# Patient Record
Sex: Female | Born: 1945 | Race: White | Hispanic: No | Marital: Single | Smoking: Former smoker
Health system: Southern US, Community
[De-identification: ages and names within clinical notes are randomized; demographics above are authoritative.]

## PROBLEM LIST (undated history)

## (undated) DIAGNOSIS — Z8489 Family history of other specified conditions: Secondary | ICD-10-CM

## (undated) DIAGNOSIS — J439 Emphysema, unspecified: Secondary | ICD-10-CM

## (undated) DIAGNOSIS — Z9981 Dependence on supplemental oxygen: Secondary | ICD-10-CM

## (undated) DIAGNOSIS — J45909 Unspecified asthma, uncomplicated: Secondary | ICD-10-CM

## (undated) DIAGNOSIS — Z860100 Personal history of colon polyps, unspecified: Secondary | ICD-10-CM

## (undated) DIAGNOSIS — G5632 Lesion of radial nerve, left upper limb: Secondary | ICD-10-CM

## (undated) DIAGNOSIS — G250 Essential tremor: Secondary | ICD-10-CM

## (undated) DIAGNOSIS — K208 Other esophagitis: Secondary | ICD-10-CM

## (undated) DIAGNOSIS — Z87891 Personal history of nicotine dependence: Secondary | ICD-10-CM

## (undated) DIAGNOSIS — J9610 Chronic respiratory failure, unspecified whether with hypoxia or hypercapnia: Secondary | ICD-10-CM

## (undated) DIAGNOSIS — J432 Centrilobular emphysema: Secondary | ICD-10-CM

## (undated) DIAGNOSIS — M1990 Unspecified osteoarthritis, unspecified site: Secondary | ICD-10-CM

## (undated) DIAGNOSIS — E538 Deficiency of other specified B group vitamins: Secondary | ICD-10-CM

## (undated) DIAGNOSIS — M810 Age-related osteoporosis without current pathological fracture: Secondary | ICD-10-CM

## (undated) DIAGNOSIS — J449 Chronic obstructive pulmonary disease, unspecified: Secondary | ICD-10-CM

## (undated) DIAGNOSIS — K3184 Gastroparesis: Secondary | ICD-10-CM

## (undated) DIAGNOSIS — E559 Vitamin D deficiency, unspecified: Secondary | ICD-10-CM

## (undated) DIAGNOSIS — F4322 Adjustment disorder with anxiety: Secondary | ICD-10-CM

## (undated) DIAGNOSIS — J309 Allergic rhinitis, unspecified: Secondary | ICD-10-CM

## (undated) DIAGNOSIS — G4700 Insomnia, unspecified: Secondary | ICD-10-CM

## (undated) DIAGNOSIS — S42409A Unspecified fracture of lower end of unspecified humerus, initial encounter for closed fracture: Secondary | ICD-10-CM

## (undated) DIAGNOSIS — K210 Gastro-esophageal reflux disease with esophagitis: Secondary | ICD-10-CM

## (undated) DIAGNOSIS — Z800 Family history of malignant neoplasm of digestive organs: Secondary | ICD-10-CM

## (undated) DIAGNOSIS — K219 Gastro-esophageal reflux disease without esophagitis: Secondary | ICD-10-CM

## (undated) DIAGNOSIS — T4145XA Adverse effect of unspecified anesthetic, initial encounter: Secondary | ICD-10-CM

## (undated) DIAGNOSIS — M8589 Other specified disorders of bone density and structure, multiple sites: Secondary | ICD-10-CM

## (undated) DIAGNOSIS — I479 Paroxysmal tachycardia, unspecified: Secondary | ICD-10-CM

## (undated) DIAGNOSIS — N390 Urinary tract infection, site not specified: Secondary | ICD-10-CM

## (undated) DIAGNOSIS — G4733 Obstructive sleep apnea (adult) (pediatric): Secondary | ICD-10-CM

## (undated) DIAGNOSIS — I10 Essential (primary) hypertension: Secondary | ICD-10-CM

## (undated) DIAGNOSIS — K5730 Diverticulosis of large intestine without perforation or abscess without bleeding: Secondary | ICD-10-CM

## (undated) DIAGNOSIS — T8859XA Other complications of anesthesia, initial encounter: Secondary | ICD-10-CM

## (undated) DIAGNOSIS — D122 Benign neoplasm of ascending colon: Secondary | ICD-10-CM

## (undated) DIAGNOSIS — F411 Generalized anxiety disorder: Secondary | ICD-10-CM

## (undated) DIAGNOSIS — D124 Benign neoplasm of descending colon: Secondary | ICD-10-CM

## (undated) DIAGNOSIS — E042 Nontoxic multinodular goiter: Secondary | ICD-10-CM

## (undated) DIAGNOSIS — IMO0001 Reserved for inherently not codable concepts without codable children: Secondary | ICD-10-CM

## (undated) HISTORY — PX: CHOLECYSTECTOMY: SHX55

## (undated) HISTORY — DX: Diverticulosis of large intestine without perforation or abscess without bleeding: K57.30

## (undated) HISTORY — DX: Personal history of colonic polyps: Z86.010

## (undated) HISTORY — DX: Personal history of nicotine dependence: Z87.891

## (undated) HISTORY — DX: Other esophagitis: K20.8

## (undated) HISTORY — DX: Insomnia, unspecified: G47.00

## (undated) HISTORY — DX: Emphysema, unspecified: J43.9

## (undated) HISTORY — DX: Nontoxic multinodular goiter: E04.2

## (undated) HISTORY — DX: Vitamin D deficiency, unspecified: E55.9

## (undated) HISTORY — DX: Generalized anxiety disorder: F41.1

## (undated) HISTORY — DX: Unspecified fracture of lower end of unspecified humerus, initial encounter for closed fracture: S42.409A

## (undated) HISTORY — DX: Family history of malignant neoplasm of digestive organs: Z80.0

## (undated) HISTORY — DX: Paroxysmal tachycardia, unspecified: I47.9

## (undated) HISTORY — DX: Unspecified osteoarthritis, unspecified site: M19.90

## (undated) HISTORY — DX: Lesion of radial nerve, left upper limb: G56.32

## (undated) HISTORY — PX: FRACTURE SURGERY: SHX138

## (undated) HISTORY — DX: Chronic obstructive pulmonary disease, unspecified: J44.9

## (undated) HISTORY — DX: Deficiency of other specified B group vitamins: E53.8

## (undated) HISTORY — DX: Centrilobular emphysema: J43.2

## (undated) HISTORY — DX: Essential (primary) hypertension: I10

## (undated) HISTORY — DX: Obstructive sleep apnea (adult) (pediatric): G47.33

## (undated) HISTORY — DX: Dependence on supplemental oxygen: Z99.81

## (undated) HISTORY — DX: Other specified disorders of bone density and structure, multiple sites: M85.89

## (undated) HISTORY — DX: Benign neoplasm of descending colon: D12.4

## (undated) HISTORY — PX: CATARACT EXTRACTION: SUR2

## (undated) HISTORY — DX: Chronic respiratory failure, unspecified whether with hypoxia or hypercapnia: J96.10

## (undated) HISTORY — DX: Personal history of colon polyps, unspecified: Z86.0100

## (undated) HISTORY — DX: Unspecified asthma, uncomplicated: J45.909

## (undated) HISTORY — PX: ABDOMINAL HYSTERECTOMY: SUR658

## (undated) HISTORY — DX: Gastroparesis: K31.84

## (undated) HISTORY — DX: Benign neoplasm of ascending colon: D12.2

## (undated) HISTORY — DX: Allergic rhinitis, unspecified: J30.9

## (undated) HISTORY — DX: Essential tremor: G25.0

## (undated) HISTORY — PX: ABDOMINAL HYSTERECTOMY: SHX81

## (undated) HISTORY — PX: OOPHORECTOMY: SHX86

## (undated) HISTORY — DX: Gastro-esophageal reflux disease with esophagitis: K21.0

## (undated) HISTORY — DX: Adjustment disorder with anxiety: F43.22

## (undated) HISTORY — DX: Gastro-esophageal reflux disease without esophagitis: K21.9

## (undated) HISTORY — DX: Age-related osteoporosis without current pathological fracture: M81.0

---

## 1984-05-18 HISTORY — PX: GALLBLADDER SURGERY: SHX652

## 1998-04-23 ENCOUNTER — Ambulatory Visit (HOSPITAL_COMMUNITY): Admission: RE | Admit: 1998-04-23 | Discharge: 1998-04-23 | Payer: Self-pay | Admitting: Gynecology

## 1998-04-23 ENCOUNTER — Encounter: Payer: Self-pay | Admitting: Gynecology

## 1998-05-01 ENCOUNTER — Other Ambulatory Visit: Admission: RE | Admit: 1998-05-01 | Discharge: 1998-05-01 | Payer: Self-pay | Admitting: Gynecology

## 1999-01-07 ENCOUNTER — Inpatient Hospital Stay (HOSPITAL_COMMUNITY): Admission: EM | Admit: 1999-01-07 | Discharge: 1999-01-10 | Payer: Self-pay | Admitting: Gastroenterology

## 1999-01-07 ENCOUNTER — Encounter: Payer: Self-pay | Admitting: Gastroenterology

## 1999-01-08 ENCOUNTER — Encounter: Payer: Self-pay | Admitting: Gastroenterology

## 1999-01-09 ENCOUNTER — Encounter: Payer: Self-pay | Admitting: Gastroenterology

## 1999-06-04 ENCOUNTER — Other Ambulatory Visit: Admission: RE | Admit: 1999-06-04 | Discharge: 1999-06-04 | Payer: Self-pay | Admitting: Gynecology

## 1999-09-09 ENCOUNTER — Other Ambulatory Visit: Admission: RE | Admit: 1999-09-09 | Discharge: 1999-09-09 | Payer: Self-pay | Admitting: Orthopedic Surgery

## 2000-03-04 ENCOUNTER — Ambulatory Visit (HOSPITAL_COMMUNITY): Admission: RE | Admit: 2000-03-04 | Discharge: 2000-03-04 | Payer: Self-pay | Admitting: Gynecology

## 2000-03-04 ENCOUNTER — Encounter: Payer: Self-pay | Admitting: Gynecology

## 2001-06-01 ENCOUNTER — Encounter: Payer: Self-pay | Admitting: Gynecology

## 2001-06-01 ENCOUNTER — Ambulatory Visit (HOSPITAL_COMMUNITY): Admission: RE | Admit: 2001-06-01 | Discharge: 2001-06-01 | Payer: Self-pay | Admitting: Gynecology

## 2001-06-06 ENCOUNTER — Other Ambulatory Visit: Admission: RE | Admit: 2001-06-06 | Discharge: 2001-06-06 | Payer: Self-pay | Admitting: Gynecology

## 2002-07-05 ENCOUNTER — Ambulatory Visit (HOSPITAL_COMMUNITY): Admission: RE | Admit: 2002-07-05 | Discharge: 2002-07-05 | Payer: Self-pay | Admitting: Internal Medicine

## 2002-07-05 ENCOUNTER — Encounter: Payer: Self-pay | Admitting: Internal Medicine

## 2002-08-29 ENCOUNTER — Encounter: Admission: RE | Admit: 2002-08-29 | Discharge: 2002-08-29 | Payer: Self-pay | Admitting: Internal Medicine

## 2002-08-29 ENCOUNTER — Encounter: Payer: Self-pay | Admitting: Internal Medicine

## 2003-06-13 ENCOUNTER — Encounter: Admission: RE | Admit: 2003-06-13 | Discharge: 2003-06-13 | Payer: Self-pay | Admitting: Internal Medicine

## 2003-12-17 ENCOUNTER — Ambulatory Visit (HOSPITAL_COMMUNITY): Admission: RE | Admit: 2003-12-17 | Discharge: 2003-12-17 | Payer: Self-pay | Admitting: Internal Medicine

## 2004-06-27 ENCOUNTER — Ambulatory Visit: Payer: Self-pay | Admitting: Gastroenterology

## 2004-12-18 ENCOUNTER — Ambulatory Visit (HOSPITAL_COMMUNITY): Admission: RE | Admit: 2004-12-18 | Discharge: 2004-12-18 | Payer: Self-pay | Admitting: Internal Medicine

## 2005-10-06 ENCOUNTER — Encounter: Admission: RE | Admit: 2005-10-06 | Discharge: 2005-10-06 | Payer: Self-pay | Admitting: Internal Medicine

## 2005-11-10 ENCOUNTER — Ambulatory Visit: Payer: Self-pay | Admitting: Gastroenterology

## 2005-12-02 ENCOUNTER — Encounter: Payer: Self-pay | Admitting: Gastroenterology

## 2005-12-02 ENCOUNTER — Ambulatory Visit: Payer: Self-pay | Admitting: Gastroenterology

## 2005-12-08 ENCOUNTER — Ambulatory Visit: Payer: Self-pay | Admitting: Gastroenterology

## 2006-01-05 ENCOUNTER — Ambulatory Visit: Payer: Self-pay | Admitting: Gastroenterology

## 2006-04-01 ENCOUNTER — Ambulatory Visit (HOSPITAL_COMMUNITY): Admission: RE | Admit: 2006-04-01 | Discharge: 2006-04-01 | Payer: Self-pay | Admitting: Internal Medicine

## 2006-07-22 ENCOUNTER — Ambulatory Visit: Payer: Self-pay | Admitting: Gastroenterology

## 2007-04-19 ENCOUNTER — Ambulatory Visit (HOSPITAL_COMMUNITY): Admission: RE | Admit: 2007-04-19 | Discharge: 2007-04-19 | Payer: Self-pay | Admitting: Internal Medicine

## 2007-07-11 ENCOUNTER — Ambulatory Visit: Payer: Self-pay | Admitting: Internal Medicine

## 2007-07-19 ENCOUNTER — Encounter: Payer: Self-pay | Admitting: Gastroenterology

## 2007-07-19 DIAGNOSIS — I10 Essential (primary) hypertension: Secondary | ICD-10-CM

## 2007-07-19 DIAGNOSIS — K3184 Gastroparesis: Secondary | ICD-10-CM | POA: Insufficient documentation

## 2007-07-19 DIAGNOSIS — D126 Benign neoplasm of colon, unspecified: Secondary | ICD-10-CM | POA: Insufficient documentation

## 2007-07-19 HISTORY — DX: Essential (primary) hypertension: I10

## 2007-07-26 ENCOUNTER — Telehealth (INDEPENDENT_AMBULATORY_CARE_PROVIDER_SITE_OTHER): Payer: Self-pay | Admitting: *Deleted

## 2007-07-29 ENCOUNTER — Telehealth (INDEPENDENT_AMBULATORY_CARE_PROVIDER_SITE_OTHER): Payer: Self-pay | Admitting: *Deleted

## 2007-08-02 ENCOUNTER — Ambulatory Visit: Payer: Self-pay | Admitting: Gastroenterology

## 2007-08-02 ENCOUNTER — Inpatient Hospital Stay (HOSPITAL_COMMUNITY): Admission: AD | Admit: 2007-08-02 | Discharge: 2007-08-04 | Payer: Self-pay | Admitting: Gastroenterology

## 2007-08-03 ENCOUNTER — Encounter: Payer: Self-pay | Admitting: Gastroenterology

## 2007-08-09 ENCOUNTER — Ambulatory Visit: Payer: Self-pay | Admitting: Internal Medicine

## 2007-08-23 ENCOUNTER — Ambulatory Visit: Payer: Self-pay | Admitting: Gastroenterology

## 2007-08-23 LAB — CONVERTED CEMR LAB
Albumin: 4.1 g/dL (ref 3.5–5.2)
CO2: 29 meq/L (ref 19–32)
Chloride: 94 meq/L — ABNORMAL LOW (ref 96–112)
GFR calc Af Amer: 131 mL/min
GFR calc non Af Amer: 108 mL/min
Phosphorus: 3.9 mg/dL (ref 2.3–4.6)
Sodium: 129 meq/L — ABNORMAL LOW (ref 135–145)

## 2007-09-01 ENCOUNTER — Ambulatory Visit: Payer: Self-pay | Admitting: Gastroenterology

## 2007-09-01 ENCOUNTER — Ambulatory Visit: Payer: Self-pay | Admitting: Internal Medicine

## 2007-09-01 LAB — CONVERTED CEMR LAB
Calcium: 9.7 mg/dL (ref 8.4–10.5)
Chloride: 90 meq/L — ABNORMAL LOW (ref 96–112)
Creatinine, Ser: 0.6 mg/dL (ref 0.4–1.2)
GFR calc Af Amer: 131 mL/min
Phosphorus: 3.7 mg/dL (ref 2.3–4.6)
Potassium: 3.2 meq/L — ABNORMAL LOW (ref 3.5–5.1)

## 2007-09-05 ENCOUNTER — Telehealth (INDEPENDENT_AMBULATORY_CARE_PROVIDER_SITE_OTHER): Payer: Self-pay | Admitting: *Deleted

## 2007-09-08 ENCOUNTER — Ambulatory Visit: Payer: Self-pay | Admitting: Gastroenterology

## 2007-09-08 LAB — CONVERTED CEMR LAB
Albumin: 4 g/dL (ref 3.5–5.2)
BUN: 3 mg/dL — ABNORMAL LOW (ref 6–23)
Calcium: 9.4 mg/dL (ref 8.4–10.5)
Creatinine, Ser: 0.5 mg/dL (ref 0.4–1.2)
Glucose, Bld: 80 mg/dL (ref 70–99)
Phosphorus: 3.8 mg/dL (ref 2.3–4.6)
Potassium: 3.5 meq/L (ref 3.5–5.1)

## 2007-09-15 ENCOUNTER — Ambulatory Visit: Payer: Self-pay | Admitting: Internal Medicine

## 2007-09-22 ENCOUNTER — Telehealth: Payer: Self-pay | Admitting: Gastroenterology

## 2007-09-26 ENCOUNTER — Inpatient Hospital Stay (HOSPITAL_COMMUNITY): Admission: EM | Admit: 2007-09-26 | Discharge: 2007-09-29 | Payer: Self-pay | Admitting: Emergency Medicine

## 2007-09-26 ENCOUNTER — Telehealth: Payer: Self-pay | Admitting: Gastroenterology

## 2007-09-26 ENCOUNTER — Telehealth (INDEPENDENT_AMBULATORY_CARE_PROVIDER_SITE_OTHER): Payer: Self-pay | Admitting: *Deleted

## 2007-09-26 ENCOUNTER — Ambulatory Visit: Payer: Self-pay | Admitting: Pulmonary Disease

## 2007-09-26 ENCOUNTER — Ambulatory Visit: Payer: Self-pay | Admitting: Internal Medicine

## 2007-09-28 ENCOUNTER — Encounter: Payer: Self-pay | Admitting: Pulmonary Disease

## 2007-10-12 DIAGNOSIS — K2100 Gastro-esophageal reflux disease with esophagitis, without bleeding: Secondary | ICD-10-CM | POA: Insufficient documentation

## 2007-10-12 DIAGNOSIS — K648 Other hemorrhoids: Secondary | ICD-10-CM | POA: Insufficient documentation

## 2007-10-12 DIAGNOSIS — K5730 Diverticulosis of large intestine without perforation or abscess without bleeding: Secondary | ICD-10-CM

## 2007-10-12 DIAGNOSIS — M8589 Other specified disorders of bone density and structure, multiple sites: Secondary | ICD-10-CM | POA: Insufficient documentation

## 2007-10-12 HISTORY — DX: Gastro-esophageal reflux disease with esophagitis, without bleeding: K21.00

## 2007-10-12 HISTORY — DX: Diverticulosis of large intestine without perforation or abscess without bleeding: K57.30

## 2007-10-12 HISTORY — DX: Other specified disorders of bone density and structure, multiple sites: M85.89

## 2007-10-14 ENCOUNTER — Ambulatory Visit: Payer: Self-pay | Admitting: Gastroenterology

## 2007-10-19 ENCOUNTER — Ambulatory Visit: Payer: Self-pay | Admitting: Pulmonary Disease

## 2007-10-19 DIAGNOSIS — J449 Chronic obstructive pulmonary disease, unspecified: Secondary | ICD-10-CM

## 2007-10-19 DIAGNOSIS — F17200 Nicotine dependence, unspecified, uncomplicated: Secondary | ICD-10-CM

## 2007-10-19 DIAGNOSIS — J4489 Other specified chronic obstructive pulmonary disease: Secondary | ICD-10-CM | POA: Insufficient documentation

## 2007-10-19 DIAGNOSIS — G4733 Obstructive sleep apnea (adult) (pediatric): Secondary | ICD-10-CM | POA: Insufficient documentation

## 2007-10-26 ENCOUNTER — Ambulatory Visit (HOSPITAL_BASED_OUTPATIENT_CLINIC_OR_DEPARTMENT_OTHER): Admission: RE | Admit: 2007-10-26 | Discharge: 2007-10-26 | Payer: Self-pay | Admitting: Pulmonary Disease

## 2007-10-26 ENCOUNTER — Ambulatory Visit: Payer: Self-pay | Admitting: Pulmonary Disease

## 2007-11-07 ENCOUNTER — Telehealth: Payer: Self-pay | Admitting: Pulmonary Disease

## 2007-11-17 ENCOUNTER — Telehealth (INDEPENDENT_AMBULATORY_CARE_PROVIDER_SITE_OTHER): Payer: Self-pay | Admitting: *Deleted

## 2007-11-24 ENCOUNTER — Ambulatory Visit: Payer: Self-pay | Admitting: Pulmonary Disease

## 2007-11-24 DIAGNOSIS — R498 Other voice and resonance disorders: Secondary | ICD-10-CM

## 2007-11-28 ENCOUNTER — Telehealth (INDEPENDENT_AMBULATORY_CARE_PROVIDER_SITE_OTHER): Payer: Self-pay | Admitting: *Deleted

## 2007-11-30 ENCOUNTER — Encounter: Payer: Self-pay | Admitting: Pulmonary Disease

## 2007-12-29 ENCOUNTER — Encounter: Payer: Self-pay | Admitting: Pulmonary Disease

## 2008-01-04 ENCOUNTER — Encounter (HOSPITAL_COMMUNITY): Admission: RE | Admit: 2008-01-04 | Discharge: 2008-04-03 | Payer: Self-pay | Admitting: Pulmonary Disease

## 2008-01-04 ENCOUNTER — Encounter: Payer: Self-pay | Admitting: Pulmonary Disease

## 2008-01-04 ENCOUNTER — Telehealth: Payer: Self-pay | Admitting: Gastroenterology

## 2008-01-27 ENCOUNTER — Encounter: Payer: Self-pay | Admitting: Pulmonary Disease

## 2008-02-14 ENCOUNTER — Encounter: Payer: Self-pay | Admitting: Internal Medicine

## 2008-02-17 ENCOUNTER — Telehealth: Payer: Self-pay | Admitting: Pulmonary Disease

## 2008-02-24 ENCOUNTER — Encounter: Payer: Self-pay | Admitting: Gastroenterology

## 2008-02-24 ENCOUNTER — Telehealth (INDEPENDENT_AMBULATORY_CARE_PROVIDER_SITE_OTHER): Payer: Self-pay | Admitting: *Deleted

## 2008-04-03 ENCOUNTER — Encounter: Payer: Self-pay | Admitting: Pulmonary Disease

## 2008-04-24 ENCOUNTER — Ambulatory Visit (HOSPITAL_COMMUNITY): Admission: RE | Admit: 2008-04-24 | Discharge: 2008-04-24 | Payer: Self-pay | Admitting: Internal Medicine

## 2008-09-21 ENCOUNTER — Telehealth (INDEPENDENT_AMBULATORY_CARE_PROVIDER_SITE_OTHER): Payer: Self-pay | Admitting: *Deleted

## 2008-09-25 ENCOUNTER — Ambulatory Visit: Payer: Self-pay | Admitting: Pulmonary Disease

## 2008-10-11 ENCOUNTER — Encounter (INDEPENDENT_AMBULATORY_CARE_PROVIDER_SITE_OTHER): Payer: Self-pay | Admitting: *Deleted

## 2008-10-16 ENCOUNTER — Telehealth: Payer: Self-pay | Admitting: Gastroenterology

## 2008-10-25 ENCOUNTER — Telehealth: Payer: Self-pay | Admitting: Gastroenterology

## 2008-10-26 ENCOUNTER — Telehealth: Payer: Self-pay | Admitting: Gastroenterology

## 2008-12-10 ENCOUNTER — Telehealth: Payer: Self-pay | Admitting: Gastroenterology

## 2008-12-26 ENCOUNTER — Ambulatory Visit: Payer: Self-pay | Admitting: Gastroenterology

## 2009-01-24 ENCOUNTER — Ambulatory Visit: Payer: Self-pay | Admitting: Pulmonary Disease

## 2009-01-31 ENCOUNTER — Encounter: Payer: Self-pay | Admitting: Gastroenterology

## 2009-03-04 ENCOUNTER — Telehealth: Payer: Self-pay | Admitting: Gastroenterology

## 2009-03-13 ENCOUNTER — Telehealth: Payer: Self-pay | Admitting: Gastroenterology

## 2009-05-24 ENCOUNTER — Telehealth: Payer: Self-pay | Admitting: Gastroenterology

## 2009-05-28 ENCOUNTER — Telehealth: Payer: Self-pay | Admitting: Gastroenterology

## 2009-09-16 ENCOUNTER — Encounter (INDEPENDENT_AMBULATORY_CARE_PROVIDER_SITE_OTHER): Payer: Self-pay | Admitting: *Deleted

## 2009-09-16 ENCOUNTER — Telehealth (INDEPENDENT_AMBULATORY_CARE_PROVIDER_SITE_OTHER): Payer: Self-pay | Admitting: *Deleted

## 2009-09-20 ENCOUNTER — Encounter (INDEPENDENT_AMBULATORY_CARE_PROVIDER_SITE_OTHER): Payer: Self-pay | Admitting: *Deleted

## 2009-09-23 ENCOUNTER — Ambulatory Visit: Payer: Self-pay | Admitting: Gastroenterology

## 2009-10-07 ENCOUNTER — Ambulatory Visit: Payer: Self-pay | Admitting: Gastroenterology

## 2009-10-07 DIAGNOSIS — Z860100 Personal history of colon polyps, unspecified: Secondary | ICD-10-CM

## 2009-10-07 HISTORY — DX: Personal history of colon polyps, unspecified: Z86.0100

## 2009-10-07 HISTORY — DX: Personal history of colonic polyps: Z86.010

## 2009-10-08 ENCOUNTER — Encounter: Payer: Self-pay | Admitting: Gastroenterology

## 2010-01-21 ENCOUNTER — Ambulatory Visit: Payer: Self-pay | Admitting: Pulmonary Disease

## 2010-04-25 ENCOUNTER — Encounter
Admission: RE | Admit: 2010-04-25 | Discharge: 2010-04-25 | Payer: Self-pay | Source: Home / Self Care | Attending: Family Medicine | Admitting: Family Medicine

## 2010-05-14 ENCOUNTER — Telehealth: Payer: Self-pay | Admitting: Gastroenterology

## 2010-05-27 ENCOUNTER — Encounter: Payer: Self-pay | Admitting: Gastroenterology

## 2010-06-06 ENCOUNTER — Ambulatory Visit
Admission: RE | Admit: 2010-06-06 | Discharge: 2010-06-06 | Payer: Self-pay | Source: Home / Self Care | Attending: Gastroenterology | Admitting: Gastroenterology

## 2010-06-19 NOTE — Procedures (Signed)
Summary: Colonoscopy  Patient: Virginia Burns Note: All result statuses are Final unless otherwise noted.  Tests: (1) Colonoscopy (COL)   COL Colonoscopy           DONE     [facility]     [address]     [place], [place]  [address]           COLONOSCOPY PROCEDURE REPORT           PATIENT:  [name], [name]  MR#:  [id number]     BIRTHDATE:  February 02, 1946, 63 yrs. old  GENDER:  female     ENDOSCOPIST:  [name], MD, [facility]     REF. BY:  [name], M.D.     PROCEDURE DATE:  10/07/2009     PROCEDURE:  Colonoscopy with snare polypectomy     ASA CLASS:  Class II     INDICATIONS:  colorectal cancer screening     MEDICATIONS:   Fentanyl 75 mcg IV, Versed 7 mg IV           DESCRIPTION OF PROCEDURE:   After the risks benefits and     alternatives of the procedure were thoroughly explained, informed     consent was obtained.  Digital rectal exam was performed and     revealed no abnormalities.   The LB [id number] endoscope     was introduced through the anus and advanced to the cecum, which     was identified by both the appendix and ileocecal valve, without     limitations.  The quality of the prep was excellent, using     MoviPrep.  The instrument was then slowly withdrawn as the colon     was fully examined.     <<PROCEDUREIMAGES>>           FINDINGS:  A sessile polyp was found. FLAT ASCENDING POLYP     COLD SNARE EXCISED AND SENT TO PATH.  Mild diverticulosis was     found in the sigmoid colon.  This was otherwise a normal     examination of the colon.   Retroflexed views in the rectum     revealed no abnormalities.    The scope was then withdrawn from     the patient and the procedure completed.           COMPLICATIONS:  None     ENDOSCOPIC IMPRESSION:     1) Sessile polyp     2) Mild diverticulosis in the sigmoid colon     3) Otherwise normal examination     RECOMMENDATIONS:     1) high fiber diet     2) Repeat colonoscopy in 5 years if  polyp adenomatous; otherwise     10 years     REPEAT EXAM:  No           ______________________________     [name], MD, [facility]           CC:           n.     eSIGNED:   [name]. Dyron Kawano at 10/07/2009 10:54 AM           Ricky Stabs, [id number]  Note: An exclamation mark (!) indicates a result that was not dispersed into the flowsheet. Document Creation Date: 10/07/2009 10:55 AM _______________________________________________________________________  (1) Order result status: Final Collection or observation date-time: 10/07/2009 10:47 Requested date-time:  Receipt date-time:  Reported date-time:  Referring Physician:  Ordering Physician: Sheryn Bison [id number]) Specimen Source:  Source: Launa Grill Order Number: [id number] Lab site:   Appended Document: Colonoscopy     Procedures Next Due Date:    Colonoscopy: 09/2014

## 2010-06-19 NOTE — Assessment & Plan Note (Signed)
Summary: PER PT CALL/PT DUE/MH   Visit Type:  Follow-Burns Copy to:  Sheryn Bison Primary Provider/Referring Provider:  Macarthur Critchley, M.D.  CC:  COPD...OSA...no sleep problems per patient...breathing is better...quit smoking 5 months ago.  History of Present Illness: I saw Virginia Burns for her COPD, tobacco abuse, and mild REM OSA  She quit smoking 5 months ago.  Her breathing, and hoarseness have improved since then.  She has not needed to use ventolin much.  She has been worried about her weight gain.  She has been working 2 jobs and as a result has not been able to get time to exercise.  She is not having any trouble with her sleep.    Preventive Screening-Counseling & Management  Alcohol-Tobacco     Smoking Status: quit < 6 months     Packs/Day: 1.0     Year Started: 1968     Year Quit: 2011     Pack years: 3  Current Medications (verified): 1)  Symbicort 160-4.5 Mcg/act Aero (Budesonide-Formoterol Fumarate) .... 2 Puffs Two Times A Day 2)  Spiriva Handihaler 18 Mcg  Caps (Tiotropium Bromide Monohydrate) .... Two Puffs in Handihaler Daily 3)  Ventolin Hfa 108 (90 Base) Mcg/act  Aers (Albuterol Sulfate) .[name].. 1-2 Puffs Every 4-6 Hours As Needed 4)  Adult Aspirin Ec Low Strength 81 Mg  Tbec (Aspirin) .[name].. 1 By Mouth Daily 5)  Multivitamins   Tabs (Multiple Vitamin) .... Take 1 Tablet By Mouth Once A Day 6)  Calcium 1200-1000 Mg-Unit Chew (Calcium Carbonate-Vit D-Min) .... Once Daily 7)  Vitamin D 1000 Unit Tabs (Cholecalciferol) .[name].. 1 By Mouth Daily 8)  Vitamin C 500 Mg  Tabs (Ascorbic Acid) .... Take 1 Tablet By Mouth Once A Day 9)  Xyzal 5 Mg Tabs (Levocetirizine Dihydrochloride) .[name].. 1 By Mouth Daily 10)  Domperidone 10mg  .... Three Times A Day Before Meals For Stomach Acid 11)  Protonix 40 Mg Tbec (Pantoprazole Sodium) .... Take 1 Tablet By Mouth Twice A Day 12)  Norvasc 5 Mg  Tabs (Amlodipine Besylate) .... Take One Tablet Daily. 13)  Ativan 0.5 Mg Tabs  (Lorazepam) .[name].. 1 By Mouth Daily As Needed 14)  Fish Oil 1200 Mg Caps (Omega-3 Fatty Acids) .... Once Daily 15)  Vitamin E 400 Unit Caps (Vitamin E) .... Once Daily 16)  Magnesium 250 Mg Tabs (Magnesium) .... Once Daily 17)  Vitamin D3 50000 Unit Caps (Cholecalciferol) .... Once Weekly  Allergies (verified): 1)  ! * Clyndamycin  Past History:  Past Medical History: Reviewed history from 01/24/2009 and no changes required. 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 Christia Reading, ENT  Past Surgical History: Reviewed history from 10/12/2007 and no changes required. cholecystectomy hysterectomy and oophorectomy remote  Social History: Smoking Status:  quit < 6 months Pack years:  34  Review of Systems       The patient complains of non-productive cough and weight change.  The patient denies shortness of breath with activity, shortness  of breath at rest, productive cough, coughing Burns blood, chest pain, irregular heartbeats, acid heartburn, indigestion, abdominal pain, difficulty swallowing, sore throat, nasal congestion/difficulty breathing through nose, hand/feet swelling, rash, change in color of mucus, and fever.    Vital Signs:  Patient profile:   65 year old female Height:      66 inches (167.64 cm) Weight:      201 pounds (91.36 kg) BMI:     32.56 O2 Sat:      97 % on Room air Temp:     98.3 degrees F (36.83 degrees C) oral Pulse rate:   90 / minute BP sitting:    132 / 80  (left arm) Cuff size:   regular  Vitals Entered By: Michel Bickers CMA (January 21, 2010 4:31 PM)  O2 Sat at Rest %:  97 O2 Flow:  Room air CC: COPD...OSA...no sleep problems per patient...breathing is better...quit smoking 5 months ago Comments Medications reviewed with the patient. Daytime phone verified. Michel Bickers [facility]  January 21, 2010 4:39 PM   Physical Exam  General:  obese.   Nose:  no sinus tenderness Mouth:  no oral lesions Neck:  no JVD.   Lungs:  prolonged exhalation, no wheeze Heart:  regular rhythm, normal rate, and no murmurs.   Extremities:  no edema Neurologic:  normal CN II-XII and strength normal.   Cervical Nodes:  no significant adenopathy Psych:  alert and cooperative; normal mood and affect; normal attention span and concentration   Impression & Recommendations:  Problem # 1:  COPD (ICD-496) Her breathing has improved since she stopped smoking.  Will have her try using symbicort two puffs at bedtime.  If she is stable, then she can try stopping symbicort.  Will have her continue spiriva and as needed ventolin.    Advised her to resume her exercise program as tolerated.  Problem # 2:  HOARSENESS (ICD-784.49)  This has improved since she stopped smoking.  Problem # 3:  TOBACCO ABUSE (ICD-305.1)  Have congratulated her on quitting cigarettes, and encouraged her to maintain her smoking abstinence.  Problem # 4:  SLEEP APNEA, OBSTRUCTIVE (ICD-327.23)  Her sleep has improved.  Will monitor clinically.  Medications Added to Medication List This Visit: 1)  Symbicort 160-4.5 Mcg/act Aero (Budesonide-formoterol fumarate) .... 2 puffs at bedtime. 2)  Calcium 1200-1000 Mg-unit Chew (Calcium carbonate-vit d-min) .... Once daily 3)  Fish Oil 1200 Mg Caps (Omega-3 fatty acids) .... Once daily 4)  Vitamin E 400 Unit Caps (Vitamin e) .... Once daily 5)  Magnesium 250 Mg Tabs (Magnesium) .... Once daily 6)  Vitamin D3 50000 Unit Caps  (Cholecalciferol) .... Once weekly  Complete Medication List: 1)  Symbicort 160-4.5 Mcg/act Aero (Budesonide-formoterol fumarate) .... 2 puffs at bedtime. 2)  Spiriva Handihaler 18 Mcg Caps (Tiotropium bromide monohydrate) .... Two puffs in handihaler daily 3)  Ventolin Hfa 108 (90 Base) Mcg/act Aers (Albuterol sulfate) .Marland Trick.. 1-2 puffs every 4-6 hours as needed 4)  Adult Aspirin Ec Low Strength 81 Mg Tbec (Aspirin) .Marland Sabo.. 1 by mouth daily 5)  Multivitamins Tabs (Multiple vitamin) .... Take 1 tablet by mouth once a day 6)  Calcium 1200-1000 Mg-unit Chew (Calcium carbonate-vit d-min) .... Once daily 7)  Vitamin D 1000 Unit Tabs (Cholecalciferol) .Marland Pedigo.. 1 by mouth daily 8)  Vitamin C 500 Mg Tabs (Ascorbic acid) .... Take 1 tablet by mouth once a day 9)  Xyzal 5 Mg Tabs (Levocetirizine dihydrochloride) .Marland Adee.. 1 by mouth daily 10)  Domperidone  10mg   .... Three times a day before meals for stomach acid 11)  Protonix 40 Mg Tbec (Pantoprazole sodium) .... Take 1 tablet by mouth twice a day 12)  Norvasc 5 Mg Tabs (Amlodipine besylate) .... Take one tablet daily. 13)  Ativan 0.5 Mg Tabs (Lorazepam) .Marland Tremain.. 1 by mouth daily as needed 14)  Fish Oil 1200 Mg Caps (Omega-3 fatty acids) .... Once daily 15)  Vitamin E 400 Unit Caps (Vitamin e) .... Once daily 16)  Magnesium 250 Mg Tabs (Magnesium) .... Once daily 17)  Vitamin D3 50000 Unit Caps (Cholecalciferol) .... Once weekly  Other Orders: Est. Patient Level III (59563)  Patient Instructions: 1)  Try using symbicort two puffs at bedtime for 3 to 4 weeks.  If breathing is okay, then try stopping symbicort 2)  Spiriva one puff once daily 3)  Ventolin two puffs as needed  4)  Follow Burns in 6 months

## 2010-06-19 NOTE — Progress Notes (Signed)
Summary: Medication  Phone Note Call from Patient Call back at [phone] Cell   Caller: Patient Call For: Dr. Jarold Motto Reason for Call: Refill Medication Summary of Call: Needs a refill on her Domperidone....Marland Kitchenrequest that you call her first Initial call taken by: Karna Christmas,  May 14, 2010 10:38 AM  Follow-up for Phone Call        Left a message on patients machine to call back. patient not seen in the office since 10/14/2007. She will need an office visit before refills.  Follow-up by: Harlow Mares CMA Duncan Dull),  May 14, 2010 10:49 AM  Additional Follow-up for Phone Call Additional follow up Details #1::        faxed rx for #240 and the patient will make sure to keep her appt on 06/03/2010. Additional Follow-up by: Harlow Mares CMA Duncan Dull),  May 14, 2010 2:58 PM    New/Updated Medications: * DOMPERIDONE 10MG  three times a day before meals Prescriptions: DOMPERIDONE 10MG  three times a day before meals  #240 x 0   Entered by:   Harlow Mares CMA (AAMA)   Authorized by:   Mardella Layman MD [facility]   Signed by:   Harlow Mares CMA (AAMA) on 05/14/2010   Method used:   Faxed to ...       [facility]* (retail)       [address]       [place], [place]  [address]       Ph: [phone]       Fax: [phone]   RxID:   [id number]

## 2010-06-19 NOTE — Assessment & Plan Note (Addendum)
Summary: follow up gastroparesis/lk   History of Present Illness Visit Type: Follow-up Visit Primary GI MD: Sheryn Bison MD FACP FAGA Primary Provider: Macarthur Critchley, M.D. Chief Complaint: f/u gastroparesis, pt denies any abd pain, N/V. History of Present Illness:   Virginia Burns is a 65 year old Caucasian female with idiopathic rather severe recurrent gastroparesis. She has had an excellent year with minimal relapses usually managed by going on clear liquids for a few days at a time. She continues on domperidone 10 mg t.i.d. and Protonix 40 mg a day. She had an adenomatous colon polyp removed in May of 2011. Other problems include COPD, hypertension, and gastroparesis. Apparently recent upper abdominal ultrasound exam was unremarkable.   GI Review of Systems      Denies abdominal pain, acid reflux, belching, bloating, chest pain, dysphagia with liquids, dysphagia with solids, heartburn, loss of appetite, nausea, vomiting, vomiting blood, weight loss, and  weight gain.        Denies anal fissure, black tarry stools, change in bowel habit, constipation, diarrhea, diverticulosis, fecal incontinence, heme positive stool, hemorrhoids, irritable bowel syndrome, jaundice, light color stool, liver problems, rectal bleeding, and  rectal pain.    Current Medications (verified): 1)  Symbicort 160-4.5 Mcg/act Aero (Budesonide-Formoterol Fumarate) .... 2 Puffs At Bedtime. 2)  Spiriva Handihaler 18 Mcg  Caps (Tiotropium Bromide Monohydrate) .... Two Puffs in Handihaler Daily 3)  Ventolin Hfa 108 (90 Base) Mcg/act  Aers (Albuterol Sulfate) .Marland Rosevear.. 1-2 Puffs Every 4-6 Hours As Needed 4)  Adult Aspirin Ec Low Strength 81 Mg  Tbec (Aspirin) .Marland Mally.. 1 By Mouth Daily 5)  Multivitamins   Tabs (Multiple Vitamin) .... Take 1 Tablet By Mouth Once A Day 6)  Calcium 1200-1000 Mg-Unit Chew (Calcium Carbonate-Vit D-Min) .... Once Daily 7)  Vitamin D 1000 Unit Tabs (Cholecalciferol) .Marland Schuler.. 1 By Mouth Daily 8)  Vitamin C 500 Mg   Tabs (Ascorbic Acid) .... Take 1 Tablet By Mouth Once A Day 9)  Xyzal 5 Mg Tabs (Levocetirizine Dihydrochloride) .Marland Conyer.. 1 By Mouth Daily 10)  Domperidone 10mg  .... Three Times A Day Before Meals 11)  Protonix 40 Mg Tbec (Pantoprazole Sodium) .... Take 1 Tablet By Mouth Twice A Day 12)  Norvasc 5 Mg  Tabs (Amlodipine Besylate) .... Take One Tablet Daily. 13)  Ativan 0.5 Mg Tabs (Lorazepam) .Marland Konicki.. 1 By Mouth Daily As Needed 14)  Fish Oil 1200 Mg Caps (Omega-3 Fatty Acids) .... Once Daily 15)  Vitamin E 400 Unit Caps (Vitamin E) .... Once Daily 16)  Magnesium 250 Mg Tabs (Magnesium) .... Once Daily 17)  Vitamin D3 3000 Unit Tabs (Cholecalciferol) .... One Tablet By Mouth Once Daily  Allergies (verified): 1)  ! * Clyndamycin  Past History:  Past medical, surgical, family and social histories (including risk factors) reviewed for relevance to current acute and chronic problems.  Past Medical History: Reviewed history from 01/24/2009 and no changes required. 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 Christia Reading, ENT  Past Surgical History: Reviewed history from 10/12/2007 and  no changes required. cholecystectomy hysterectomy and oophorectomy remote  Family History: Reviewed history from 01/24/2009 and no changes required. Mother - COPD Cousin - Colon cancer Brother - Colitis/Crohn's Son - Diabetes  Social History: Reviewed history from 01/24/2009 and no changes  required. Single.  Works as Hydrographic surveyor.  Smokes 1 ppd.   Alcohol Use - yes Daily Caffeine Use  Review of Systems         General:  Denies fever, chills, sweats, anorexia, fatigue, weakness, malaise, weight loss, and sleep disorder; She has gained weight from 140 pounds to 197 with control of her gastroparesis.Marland Hogg Resp:  Complains of dyspnea with exercise and wheezing; denies dyspnea at rest, cough, sputum, coughing up blood, and pleurisy. GI:  Denies difficulty swallowing, pain on swallowing, nausea, indigestion/heartburn, vomiting, vomiting blood, abdominal pain, jaundice, gas/bloating, diarrhea, constipation, change in bowel habits, bloody BM's, black BMs, and fecal incontinence. Heme:  Complains of bruising and bleeding.  Vital Signs:  Patient profile:   65 year old female Height:      66 inches Weight:      196.50 pounds BMI:     31.83 Pulse rate:   94 / minute Pulse rhythm:   regular BP sitting:   124 / 66  (left arm) Cuff size:   regular  Vitals Entered By: Christie Nottingham CMA Duncan Dull) (June 06, 2010 11:25 AM)  Physical Exam  General:  Well developed, well nourished, no acute distress.healthy appearing.   Head:  Normocephalic and atraumatic. Eyes:  PERRLA, no icterus.exam deferred to patient's ophthalmologist.   Abdomen:  Soft, nontender and nondistended. No masses, hepatosplenomegaly or hernias noted. Normal bowel sounds.There a ventral hernia in the epigastric area associated with diastases and some palpable bowel loops. I cannot appreciate organomegaly, hepatomegaly or splenomegaly. There is no evidence of ascites, other masses or tenderness. Bowel sounds are normal. Extremities:  No clubbing, cyanosis, edema or deformities noted. Neurologic:  Alert and  oriented x4;  grossly normal neurologically. Psych:  Alert and cooperative. Normal mood and affect.   Impression & Recommendations:  Problem # 1:  CARCINOMA, COLON, FAMILY HX (ICD-V16.0) Assessment  Unchanged followup colonoscopy as per clinical protocol.  Problem # 2:  GASTROPARESIS (ICD-536.3) Assessment: Improved Continue domperidone 10 mg 3 times a day before meals with daily PPI therapy and gastroparesis diet as tolerated.  Problem # 3:  COPD (ICD-496) Assessment: Unchanged continue all other medications as per primary care.  Patient Instructions: 1)  Copy sent to : Macarthur Critchley, M.D. 2)  Please continue current medications.  3)  Please schedule a follow-up appointment in 1 year. 4)  The medication list was reviewed and reconciled.  All changed / newly prescribed medications were explained.  A complete medication list was provided to the patient / caregiver. 5)  Liquids and foods should be eaten in small, frequent meals. Refer to brochure for further instruction.  Prescriptions: DOMPERIDONE 10MG  three times a day before meals  #240 x 3   Entered by:   Harlow Mares CMA (AAMA)   Authorized by:   Mardella Layman MD [facility]   Signed by:   Harlow Mares CMA (AAMA) on 06/06/2010   Method used:   Faxed to ...       [facility]* (retail)       [address]       [place], [place]  [address]       Ph: [phone]       Fax: [phone]   RxID:   [id number]

## 2010-06-19 NOTE — Letter (Signed)
Summary: Previsit letter  [facility]  [address] [place], [place] [address]   Phone: [phone]  Fax: [phone]       09/16/2009 MRN: [id number]  [facility] [address] [place], [place]  [address]  Dear Ms. Haas,  Welcome to the Gastroenterology Division at [facility].    You are scheduled to see a nurse for your pre-procedure visit on 09-23-09 at 2:30a.m. on the 3rd floor at [facility], [address]  We ask that you try to arrive at our office 15 minutes prior to your appointment time to allow for check-in.  Your nurse visit will consist of discussing your medical and surgical history, your immediate family medical history, and your medications.    Please bring a complete list of all your medications or, if you prefer, bring the medication bottles and we will list them.  We will need to be aware of both prescribed and over the counter drugs.  We will need to know exact dosage information as well.  If you are on blood thinners (Coumadin, Plavix, Aggrenox, Ticlid, etc.) please call our office today/prior to your appointment, as we need to consult with your physician about holding your medication.   Please be prepared to read and sign documents such as consent forms, a financial agreement, and acknowledgement forms.  If necessary, and with your consent, a friend or relative is welcome to sit-in on the nurse visit with you.  Please bring your insurance card so that we may make a copy of it.  If your insurance requires a referral to see a specialist, please bring your referral form from your primary care physician.  No co-pay is required for this nurse visit.     If you cannot keep your appointment, please call [phone] to cancel or reschedule prior to your appointment date.  This allows [place] the opportunity to schedule an appointment for another patient in need of care.    Thank you for choosing [facility] for your medical  needs.  We appreciate the opportunity to care for you.  Please visit [place] at our website  to learn more about our practice.                     Sincerely.                                                                                                                   The Gastroenterology Division

## 2010-06-19 NOTE — Letter (Signed)
Summary: [facility] Instructions  [facility]  [address] [place], [place] [address]   Phone: [phone]  Fax: [phone]       Virginia Burns    07-09-1945    MRN: [id number]        Procedure Day /Date:  Monday 10/07/2009     Arrival Time: 9:30 am      Procedure Time: 10:30 am     Location of Procedure:                    _x _  [facility] (4th Floor)                        PREPARATION FOR COLONOSCOPY WITH MOVIPREP   Starting 5 days prior to your procedure Wednesday 5/18 do not eat nuts, seeds, popcorn, corn, beans, peas,  salads, or any raw vegetables.  Do not take any fiber supplements (e.g. Metamucil, Citrucel, and Benefiber).  THE DAY BEFORE YOUR PROCEDURE         DATE: Sunday 5/22  1.  Drink clear liquids the entire day-NO SOLID FOOD  2.  Do not drink anything colored red or purple.  Avoid juices with pulp.  No orange juice.  3.  Drink at least 64 oz. (8 glasses) of fluid/clear liquids during the day to prevent dehydration and help the prep work efficiently.  CLEAR LIQUIDS INCLUDE: Water Jello Ice Popsicles Tea (sugar ok, no milk/cream) Powdered fruit flavored drinks Coffee (sugar ok, no milk/cream) Gatorade Juice: apple, white grape, white cranberry  Lemonade Clear bullion, consomm, broth Carbonated beverages (any kind) Strained chicken noodle soup Hard Candy                             4.  In the morning, mix first dose of MoviPrep solution:    Empty 1 Pouch A and 1 Pouch B into the disposable container    Add lukewarm drinking water to the top line of the container. Mix to dissolve    Refrigerate (mixed solution should be used within 24 hrs)  5.  Begin drinking the prep at 5:00 p.m. The MoviPrep container is divided by 4 marks.   Every 15 minutes drink the solution down to the next mark (approximately 8 oz) until the full liter is complete.   6.  Follow completed prep with 16 oz of clear liquid of your choice  (Nothing red or purple).  Continue to drink clear liquids until bedtime.  7.  Before going to bed, mix second dose of MoviPrep solution:    Empty 1 Pouch A and 1 Pouch B into the disposable container    Add lukewarm drinking water to the top line of the container. Mix to dissolve    Refrigerate  THE DAY OF YOUR PROCEDURE      DATE: Monday 5/23  Beginning at 5:30 a.m. (5 hours before procedure):         1. Every 15 minutes, drink the solution down to the next mark (approx 8 oz) until the full liter is complete.  2. Follow completed prep with 16 oz. of clear liquid of your choice.    3. You may drink clear liquids until 8:30 am (2 HOURS BEFORE PROCEDURE).   MEDICATION INSTRUCTIONS  Unless otherwise instructed, you should take regular prescription medications with a small sip of water   as early as possible the morning of  your procedure.        OTHER INSTRUCTIONS  You will need a responsible adult at least 64 years of age to accompany you and drive you home.   This person must remain in the waiting room during your procedure.  Wear loose fitting clothing that is easily removed.  Leave jewelry and other valuables at home.  However, you may wish to bring a book to read or  an iPod/MP3 player to listen to music as you wait for your procedure to start.  Remove all body piercing jewelry and leave at home.  Total time from sign-in until discharge is approximately 2-3 hours.  You should go home directly after your procedure and rest.  You can resume normal activities the  day after your procedure.  The day of your procedure you should not:   Drive   Make legal decisions   Operate machinery   Drink alcohol   Return to work  You will receive specific instructions about eating, activities and medications before you leave.    The above instructions have been reviewed and explained to me by   Ezra Sites RN  Sep 23, 2009 2:53 PM     I fully understand and can  verbalize these instructions _____________________________ Date _________

## 2010-06-19 NOTE — Progress Notes (Signed)
Summary: Med refill  Phone Note Call from Patient Call back at [phone]   Call For: Dr Jarold Motto Summary of Call: Needs a prescription faxed to [facility]-but also wants to give you some information about her prescription. Initial call taken by: Leanor Kail [facility],  May 24, 2009 9:24 AM  Follow-up for Phone Call        Pt needs rx faxed to [facility] for Protonix 40 mg two times a day. Follow-up by: Ashok Cordia RN,  May 24, 2009 12:36 PM    Prescriptions: PROTONIX 40 MG TBEC (PANTOPRAZOLE SODIUM) Take 1 tablet by mouth twice a day  #180 x 3   Entered by:   Ashok Cordia RN   Authorized by:   Mardella Layman MD [facility]   Signed by:   Ashok Cordia RN on 05/24/2009   Method used:   Faxed to ...       [facility] ([facility]       [address] [place], [place]  [address]       Ph: [phone]       Fax: [phone]   RxID:   [id number]

## 2010-06-19 NOTE — Progress Notes (Signed)
Summary: Questions about Medication  Phone Note From Pharmacy   Caller: Sue Lush   [facility] [phone] Call For: Dr. Jarold Motto  Summary of Call: Has some questions about her Protonix medication Ref# [phone] Initial call taken by: Karna Christmas,  May 28, 2009 12:05 PM  Follow-up for Phone Call        nurse can call Follow-up by: Mardella Layman MD [facility],  May 28, 2009 1:29 PM  Additional Follow-up for Phone Call Additional follow up Details #1::        [facility] updating pt's diagnosis.  Asking about next follow up appt.  Information given to represenative. Additional Follow-up by: Ashok Cordia RN,  May 28, 2009 3:03 PM

## 2010-06-19 NOTE — Miscellaneous (Signed)
Summary: [facility]  Clinical Lists Changes  Medications: Added new medication of MOVIPREP 100 GM  SOLR (PEG-KCL-NACL-NASULF-NA ASC-C) As per prep instructions. - Signed Rx of MOVIPREP 100 GM  SOLR (PEG-KCL-NACL-NASULF-NA ASC-C) As per prep instructions.;  #1 x 0;  Signed;  Entered by: Ezra Sites RN;  Authorized by: Mardella Layman MD [facility];  Method used: [facility] to [facility] [phone]*, [address], [place], [place], [place]  [address], Ph: [phone], Fax: [phone] Allergies: Changed allergy or adverse reaction from [facility] to [facility]    Prescriptions: MOVIPREP 100 GM  SOLR (PEG-KCL-NACL-NASULF-NA ASC-C) As per prep instructions.  #1 x 0   Entered by:   Ezra Sites RN   Authorized by:   Mardella Layman MD [facility]   Signed by:   Ezra Sites RN on 09/23/2009   Method used:   Electronically to        [facility] [phone]* (retail)       [address]       [place], [place]  [address]       Ph: [phone]       Fax: [phone]   RxID:   [id number]

## 2010-06-19 NOTE — Progress Notes (Signed)
Summary: Triage  Phone Note Call from Patient Call back at [phone]   Caller: Patient Call For: Dr. Jarold Motto Reason for Call: Talk to Nurse Summary of Call: pt wants to know if she can have her colonoscopy before 2012. Initial call taken by: Karna Christmas,  Sep 16, 2009 1:43 PM  Follow-up for Phone Call        Pt will be changing insurance in July and she have a $4000 deductable.  Current insurane policy only has a $250 deductible.  Pt asks if she can go ahead ahd have colonoscopy done before july?   Follow-up by: Ashok Cordia RN,  Sep 16, 2009 2:22 PM  Additional Follow-up for Phone Call Additional follow up Details #1::        ok Additional Follow-up by: Mardella Layman MD [facility],  Sep 16, 2009 4:06 PM    Additional Follow-up for Phone Call Additional follow up Details #2::    Please call pt and set up previsit and colon.  Needs to be done before July. Follow-up by: Ashok Cordia RN,  Sep 16, 2009 4:32 PM  Additional Follow-up for Phone Call Additional follow up Details #3:: Details for Additional Follow-up Action Taken: pt is sch'd for colonoscopy on 10-07-09. Pt. is aware of cancelation fee Additional Follow-up by: Karna Christmas,  Sep 16, 2009 4:59 PM

## 2010-06-19 NOTE — Letter (Signed)
Summary: Patient Notice- Polyp Results  [facility]  [address] [place], [place] [address]   Phone: [phone]  Fax: [phone]        Oct 08, 2009 MRN: [id number]    [facility] [address] [place], [place]  [address]    Dear Ms. Pennypacker,  I am pleased to inform you that the colon polyp(s) removed during your recent colonoscopy was (were) found to be benign (no cancer detected) upon pathologic examination.  I recommend you have a repeat colonoscopy examination in 5_ years to look for recurrent polyps, as having colon polyps increases your risk for having recurrent polyps or even colon cancer in the future.  Should you develop new or worsening symptoms of abdominal pain, bowel habit changes or bleeding from the rectum or bowels, please schedule an evaluation with either your primary care physician or with me.  Additional information/recommendations:  x__ No further action with gastroenterology is needed at this time. Please      follow-up with your primary care physician for your other healthcare      needs.  __ Please call [phone] to schedule a return visit to review your      situation.  __ Please keep your follow-up visit as already scheduled.  __ Continue treatment plan as outlined the day of your exam.  Please call us if you are having persistent problems or have questions about your condition that have not been fully answered at this time.  Sincerely,  Mardella Layman MD [facility]  This letter has been electronically signed by your physician.  Appended Document: Patient Notice- Polyp Results letter mailed.

## 2010-09-30 NOTE — H&P (Signed)
NAMEDAMARIS, ABELN             ACCOUNT NO.:  [id number]   MEDICAL RECORD NO.:  [id number]          PATIENT TYPE:  INP   LOCATION:  1508                         FACILITY:  [facility]   PHYSICIAN:  [name], MD, Caleen Essex, FAGA DATE OF BIRTH:  1946-04-28   DATE OF ADMISSION:  08/02/2007  DATE OF DISCHARGE:                              HISTORY & PHYSICAL   DATE OF ADMISSION:  August 02, 2007.   PROBLEM:  Epigastric pain, nausea, vomiting, weight loss.   HISTORY:  Samanthia is a 65 year old white female with history of  idiopathic gastroparesis, who has been on Reglan over the past several  years.  She also has history of hypertension.  She is status post  cholecystectomy, hysterectomy, oophorectomy, and has history of COPD.  The patient had a recent exacerbation of her COPD and required 2 courses  of steroids.  At this time, she presents with 3-week history of burning  epigastric pain like my stomach is on fire associated with nausea and  intermittent vomiting.  She has had no fever, chills, diarrhea, melena,  et Karie Soda.  Her appetite is good but she says that any solid food seems  to exacerbate the burning.  Her weight is down about 10 pounds since  onset.  She denies any aspirin or NSAID use, and no recent antibiotics.  She was seen and evaluated by Dr. Jarold Motto in the office and is  admitted at this time for hydration, IV antibiotics, IV metoclopramide  and further diagnostic workup.   PAST HISTORY:  Pertinent for diverticular disease gastroparesis colon  polyps, hypertension and COPD.   MEDICATIONS:  1. Reglan 10 t.i.d.  2. Norvasc 5 mg daily.  3. Zyrtec 10 daily.  4. Protonix 40 b.i.d.  5. Advair Diskus 50/250 b.i.d.  6. Spiriva HandiHaler daily.  7. Aspirin 81 mg daily.  8. Ativan 0.5 t.i.d. p.r.n.   ALLERGIES:  CLINDAMYCIN with rash.   FAMILY HISTORY:  Pertinent for cousins with colon cancer.  No immediate  family members with colon cancer.   SOCIAL HISTORY:   The patient lives with her grown son.  She is a smoker,  six to seven per day.  No regular EtOH.   REVIEW OF SYSTEMS:  HEENT:  Negative.  Cardiovascular:  Denies any chest  pain or anginal symptoms.  Pulmonary: Positive for recent prolonged of  bronchitis episode improved currently with no dyspnea or wheezing.  GI:  As above.  GU: Negative.  Musculoskeletal:  Negative.  Neuro/Psych  negative.  All other review of systems negative.   Labs are pending.   PHYSICAL EXAM:  Well-developed white female in no acute distress, alert  and oriented x3.  Weight is 145.4, blood pressure 110/70, pulse 102.  She is afebrile.  HEENT: Nontraumatic, normocephalic.  EOMI, PERRLA.  Sclerae anicteric.  Neck is supple.  There is no JVD.  She does have a hoarse voice.  LUNGS:  Clear.  No wheezes or crackles.  CARDIOVASCULAR: Regular rate and rhythm with S1-S2.  No murmur or  gallop.  ABDOMEN: Soft.  Bowel sounds are active.  There is no succussion  splash.  She is minimally tender in the epigastrium.  No guarding or rebound.  No  mass or splenomegaly.  RECTAL:  Exam is not done at this time.  EXTREMITIES:  Without clubbing, cyanosis or edema.  Neuro is grossly nonfocal.   IMPRESSION:  90. A 65 year old white female with known gastroparesis with 3-week      history of nausea and burning epigastric pain, intermittent      vomiting and weight loss, rule out exacerbation of gastroparesis,      rule out gastroenteritis, rule out peptic ulcer disease, question      steroid induced.  2. Status post cholecystectomy.  3. Status post hysterectomy, BSO.  4. Chronic obstructive pulmonary disease.  5. Gastroesophageal reflux disease.  6. Hypertension.   PLAN:  The patient is admitted to the service Dr. Melvia Heaps for IV  fluid hydration, baseline labs.  Covered with IV PPI, IV metoclopramide,  IV antiemetics.  Clear liquid diet.  Will check plain abdominal films  and schedule for upper endoscopy.  For details  please see the orders.      Amy Esterwood, PA-C      [name], MD, Caleen Essex, FAGA  Electronically Signed    AE/MEDQ  D:  08/03/2007  T:  08/03/2007  Job:  [id number]

## 2010-09-30 NOTE — Discharge Summary (Signed)
Virginia Burns, Virginia Burns             ACCOUNT NO.:  [id number]   MEDICAL RECORD NO.:  [id number]          PATIENT TYPE:  INP   LOCATION:  1508                         FACILITY:  [facility]   PHYSICIAN:  Barbette Hair. Arlyce Dice, MD,FACGDATE OF BIRTH:  03-Jun-1945   DATE OF ADMISSION:  08/02/2007  DATE OF DISCHARGE:  08/04/2007                               DISCHARGE SUMMARY   ADMITTING DIAGNOSIS:  A 65 year old white female with known  gastroparesis presenting with 3-week history of nausea. burning  epigastric pain, intermittent vomiting and weight loss.  Rule out  exacerbation of gastroparesis, rule out gastroenteritis, rule out peptic  ulcer disease or esophagitis, possibly steroid induced.   DISCHARGE DIAGNOSES:  1. Severe candida esophagitis.  2. Underlying gastroparesis.  3. Hypokalemia corrected.  4. Other diagnoses as listed above.   CONSULTATIONS:  None.   PROCEDURES:  Upper endoscopy with Dr. Arlyce Dice.   BRIEF HISTORY:  Virginia Burns is a pleasant 65 year old white female with  history of idiopathic gastroparesis who has been on Reglan over the past  several years.  She also has history of hypertension and is status post  cholecystectomy, hysterectomy, oophorectomy and has a history of COPD.  She has recently had an exacerbation of the COPD requiring 2 courses of  steroids.  She presents to Dr. Jarold Motto with complaints of a 3-week  history of burning, epigastric pain like my stomach is on fire  associated with nausea and intermittent vomiting.  She has not had any  associated fever or chills, diarrhea, melena, etcetera.  Her appetite  has been good, but she says that any solid food seems to exacerbate the  burning.  Her weight is down about 10 pounds over the past 3 weeks.  She  was seen and evaluated by Dr. Jarold Motto in the office and admitted to  the hospital for hydration, IV Reglan and then further diagnostic  evaluation.   LABORATORY STUDIES:  On admit, a WBC of 9.6, hemoglobin 13.5,  hematocrit  of 40.1, MCV of 94, platelets 354.  Electrolytes showed a potassium of  2.7, sodium 133, BUN 2, creatinine 0.56, glucose of 92, potassium was  corrected and was 3.7 on the 18th, albumin 3.7.  Liver function studies  normal.  Lipase 20.   X-ray studies plain abdominal films on admission unremarkable.   HOSPITAL COURSE:  The patient was admitted to the service of Dr. Melvia Heaps who was covering the hospital.  She was placed on IV fluids, IV  PPI, IV Reglan, Zofran for nausea, a clear liquid diet.  Had plain  abdominal films which were negative for obstruction.  She was then  scheduled for upper endoscopy with Dr. Arlyce Dice with finding of a severe  candida esophagitis.  She was placed on oral Diflucan  On August 04, 2007, she was feeling well enough to tolerate a solid diet; still  complaining of some burning, but had not had any further nausea or  vomiting since admission.  She was otherwise felt stable and was allowed  discharge to home with instructions to follow up with Dr. Jarold Motto in  the office on April 7  at 2:30, to call for any problems in the interim.   DISCHARGE MEDICATIONS:  She was to take:  1. Diflucan 100 mg daily for 13 more days.  2. Protonix 40 mg b.i.d.  3. Reglan 10 t.i.d. one half hour after meal.  4. To continue her Norvasc 5 mg daily.  5. Zyrtec 10 daily.  6. Ativan 0.5 t.i.d. p.r.n. as needed.  7. Spiriva HaniHaler daily.  8. Advair Discus twice daily.   CONDITION ON DISCHARGE:  Stable and improved.      Virginia Esterwood, PA-C      Robert D. Arlyce Dice, MD,FACG  Electronically Signed    AE/MEDQ  D:  08/15/2007  T:  08/15/2007  Job:  [id number]   cc:   Barbette Hair. Arlyce Dice, MD,FACG  [address]  [place]  [place] [address]

## 2010-09-30 NOTE — Discharge Summary (Signed)
Virginia Burns, Virginia Burns             ACCOUNT NO.:  [id number]   MEDICAL RECORD NO.:  [id number]          PATIENT TYPE:  INP   LOCATION:  1445                         FACILITY:  [facility]   PHYSICIAN:  Coralyn Helling, MD        DATE OF BIRTH:  1945-10-19   DATE OF ADMISSION:  09/26/2007  DATE OF DISCHARGE:                               DISCHARGE SUMMARY   DISCHARGE DIAGNOSES:  1. Acute exacerbation of chronic obstructive pulmonary disease.  2. Hyponatremia.  3. Hypertension.  4. Question of obstructive sleep apnea.   LABORATORY DATA:  On Sep 29, 2007, sodium 134, potassium 3.7, chloride  96, CO2 30, BUN 8, creatinine 0.75, glucose 90.  T3 was 65.1 and T4 was  1.41, both obtained on Sep 28, 2007.  TSH was 0.503 on Sep 26, 2007.   Chest x-ray obtained on Sep 26, 2007 demonstrates changes consistent  with chronic obstructive pulmonary disease, no acute infiltrate or  edema.   Echocardiogram obtained on Sep 28, 2007 demonstrating overall EF 60% to  65%, left ventricular wall thickness noted to be mildly increased.   BRIEF HISTORY:  This is a 65 year old white female patient who has had  four to five exacerbations of chronic obstructive pulmonary disease over  the last three to four months.  She had a history of approximately one-  to two-week worsening dyspnea which was treated by her primary care  physician a full course of prednisone and Avelox.  She was actually on  the end of a prednisone taper when she presented to the office and sorry  when she presented to the emergency room for further evaluation.  She  complained of significant postnasal drip, productive cough, and chest  tightness and wheezing.  She was admitted for failure to respond to  outpatient therapy.   HOSPITAL COURSE BY DISCHARGE DIAGNOSES:  1. Acute exacerbation of chronic obstructive pulmonary disease:  Ms.      Burns was admitted to the medical ward at [facility].      Given that she had already completed  a course full course of      antibiotics, antibiotic therapy was withheld.  She was given      supplemental oxygen, inhaled bronchodilators, and IV systemic      steroids.  She rapidly improved over the course of her      hospitalization.  She is now on oral prednisone and will be      transferred to home to continue her maintenance bronchodilators      which will consist of Symbicort daily as well as Advair 500/50      twice daily.  She will use p.r.n. Ventolin as needed and will      complete a slow prednisone taper with follow-up with nurse      practitioner, Virginia Burns, then later on Dr. Craige Cotta.  2. Question of obstructive sleep apnea:  Virginia Burns will be followed      up in the outpatient setting with question of evaluation of      polysomnogram in the outpatient setting.  3. Hypertension:  For  this she will continue her current Norvasc.  4. Gastroparesis:  For this she will continue her current therapy.      She is seen by GI in the outpatient setting for this.   DISCHARGE INSTRUCTIONS:  Diet as tolerated.   MEDICATIONS:  1. Allegra-D one tablet p.r.n.  2. Aspirin 81 mg daily.  3. Norvasc 10 mg daily.  4. Pantoprazole half a tab before dinner and breakfast.  5. Spiriva one cap inhaled daily.  6. Domperidone one pill a half hour before meals.  7. Prednisone taper 100 mg tablets, six tablets daily x2 days, then      four tablets daily x3 days, then two tablets daily x3 days, then      one tablet daily x3 days, then discontinue.  8. Advair 500/50 one puff twice daily.  She was instructed to      discontinue Symbicort as she has had difficulty with compliance      with this and has difficulty using this inhaler.  In its place, she      will resume Advair on a higher dosing.   Virginia Burns has met maximum benefit from inpatient care.  She is now  cleared for hospital discharge with outpatient follow-up.  She will see  Virginia Burns, nurse practitioner, on Oct 13, 2007 at 11:30  and also Dr.  Craige Cotta on October 19, 2007 at 2:50.      Zenia Resides, NP      Coralyn Helling, MD  Electronically Signed    PB/MEDQ  D:  09/29/2007  T:  09/29/2007  Job:  [id number]   cc:   Coralyn Helling, MD  [address]  [place], [place] [address]   [place] Oaks, NP

## 2010-09-30 NOTE — Assessment & Plan Note (Signed)
[facility]                                 ON-CALL NOTE   NAME:KITCHENNyajah, Hyson                    MRN:          [id number]  DATE:08/06/2007                            DOB:          1945/07/09    Ms. Sample called this afternoon, stating that she is having  constipation and worsening pyrosis.  She takes Protonix twice daily.  She tried some Metamucil to help her move her bowels, but this was  unsuccessful.   I instructed Ms. Milham to take MiraLax daily until she has a bowel  movement.  She can add a Carafate slurry or antacids as needed.     Barbette Hair. Arlyce Dice, MD,FACG  Electronically Signed    RDK/MedQ  DD: 08/06/2007  DT: 08/06/2007  Job #: [id number]

## 2010-09-30 NOTE — Procedures (Signed)
NAMEDMIYA, MALPHRUS NO.:  [id number]   MEDICAL RECORD NO.:  [id number]          PATIENT TYPE:  OUT   LOCATION:  SLEEP CENTER                 FACILITY:  [facility]   PHYSICIAN:  Coralyn Helling, MD        DATE OF BIRTH:  1945/10/25   DATE OF STUDY:  10/26/2007                            NOCTURNAL POLYSOMNOGRAM   REFERRING PHYSICIAN:  Coralyn Helling, MD   FACILITY:  [facility]   INDICATIONS:  Ms. Scantlebury is a 65 year old female, who has a history of  COPD and hypertension.  She also has sleep disruption and excessive  daytime sleepiness.  She is referred to the sleep lab for evaluation of  hypersomnia with obstructive sleep apnea.   Height is 5 feet 6 inches, weight is 146 pounds, BMI is 24, neck size is  14 inches.   MEDICATIONS:  Protonix, Norvasc, Ativan, Allegra, Chantix, Advair,  Spiriva.   EPWORTH SCORE:  5.   The patient took an Ativan on the night of the study.   SLEEP ARCHITECTURE:  Total recording time was 376 minutes.  Total sleep  time was 267 minutes.  Sleep efficiency is 71%.  Sleep latency is 38  minutes, which is prolonged.  REM latency is 161 minutes.  The study was  notable for lack of slow-wave sleep.  The patient slept in both the  supine and nonsupine positions.   RESPIRATORY DATA:  The average respiratory rate was 18.  The overall  apnea/hypopnea index was 5.4.  The overall respiratory disturbance index  was 12.1.  The events were exclusively obstructive in nature.  Loud  snoring was noted by the technician.  The REM apnea/hypopnea index is  21.  The non-REM apnea/hypopnea index was 3.3.  The supine  apnea/hypopnea index was 4.5.  The non-supine apnea/hypopnea index was  3.4.   OXYGEN DATA:  The baseline oxygenation was 95%.  The oxygenation nadir  was 87%.  The patient spent a total of 2 minutes with an oxygen  saturation less than 90% and 0.2 minutes with an oxygen saturation less  than 88%.   CARDIAC DATA:  The average heart  rate was 98 and the rhythm strip showed  sinus rhythm with PVC and sinus tachycardia noted during REM sleep.   MOVEMENT PARASOMNIA:  The periodic limb movement index was 0.  The  patient had two restroom trips.   IMPRESSION:  This study shows evidence for mild obstructive sleep apnea  as demonstrated by a respiratory disturbance index of 12.1 and oxygen  saturation nadir of 87%.  Of note is that the majority of events were  seen  during REM sleep.  Clinical correlation will be necessary to determine  what further interventions would be most beneficial for Ms. Sayler.      Coralyn Helling, MD  Diplomat, American Board of Sleep Medicine  Electronically Signed     VS/MEDQ  D:  11/02/2007 12:23:50  T:  11/02/2007 12:51:37  Job:  [id number]

## 2010-09-30 NOTE — H&P (Signed)
NAMEEMILYA, [name]             ACCOUNT NO.:  [id number]   MEDICAL RECORD NO.:  [id number]          PATIENT TYPE:  INP   LOCATION:  1445                         FACILITY:  [facility]   PHYSICIAN:  Coralyn Helling, MD        DATE OF BIRTH:  [birth year]   DATE OF ADMISSION:  09/26/2007  DATE OF DISCHARGE:                              HISTORY & PHYSICAL   ADMISSION DIAGNOSIS:  Dyspnea.   Ms. Virginia Burns is a 65 year old female who has had four to five  exacerbations of her COPD over the last three to four months.  She had a  recurrence of her symptoms approximately one and a half to two weeks  ago.  She was seen by Dr. Gerre Pebbles for this.  She was started on a course  of prednisone and Avelox.  She took her last dose of Avelox today.  She  is still on a tapering dose of prednisone.  Her son said that she  continues to have problems with coughing and dyspnea, and as a result of  this, had brought his mother in to the emergency room for further  evaluation.  She denies having any fevers.  She does complain of nasal  congestion with a post nasal drip.  She also complains of cough, which  was initially productive of yellowish sputum, but has now become more  clear.  She is also having hoarseness.  She denies any hemoptysis.  She  was having problems with chest tightness and wheezing, but this has  improved some.  She denies any abdominal pian, nausea, vomiting, or  diarrhea.  She also denies any skin ashes or leg swelling.  She  apparently has recently been started on supplemental oxygen at night  because she had an overnight oximetry, which showed significant oxygen  desaturations.  Her son does say that she snores quite loudly when she  is asleep.   PAST MEDICAL HISTORY:  1. Significant for gastroparesis  2. Hypertension.  3. Candidal esophagitis.  4. Status post cholecystectomy.  5. COPD.  6. Status post TAH with BSO.  7. Diverticular disease.  8. Colon polyps.   ALLERGIES:  CLINDAMYCIN WHICH  SHE GETS A RASH FROM.   OUTPATIENT MEDICATIONS:  1. Allegra.  2. Aspirin.  3. Norvasc.  4. Protonix.  5. Spiriva.  6. Symbicort.  7. Ventolin.  8. Multivitamin.  9. Calcium.  10.Vitamin D.  11.Vitamin C.   SOCIAL HISTORY:  She works as a Hydrographic surveyor.  She smokes 1-1/2 to  1 pack of cigarettes daily.  She quit approximately three weeks ago.  There is no significant history of alcohol use.   FAMILY HISTORY:  Significant for her mother who had COPD.   REVIEW OF SYSTEMS:  She recently switched from Diovan to Norvasc because  she said that the Diovan was not able to control her blood pressure  adequately.   PHYSICAL EXAMINATION:  She is seen in the emergency room.  She is awake,  alert and oriented.  Does not appear to be in acute distress.  Temperature is 98.2.  Blood pressure is 126/73, heart rate  is 105,  respiratory rate 18.  Oxygen saturation is 95%.  HEENT:  Pupils reactive.  Extraocular muscles are intact.  There is no  sinus tenderness.  She has a clear nasal discharge.  There is mild  erythema in the posterior pharynx.  There are otherwise no oral lesions.  There is no lymphadenopathy, no thyromegaly.  HEART:  S1, S2.  No murmur.  CHEST:  She had prolonged expiratory phase, but there is no wheezing.  ABDOMEN:  Thin, soft, nontender.  Positive bowel sounds.  EXTREMITIES:  There is no edema, cyanosis or clubbing.  NEUROLOGIC:  She had normal strength.   Chest x-ray showed changes consistent with COPD.  Sodium is 125, potassium 4.1, chloride is 89, CO2 is 27, BUN is 1,  creatinine 0.8, glucose 107, calcium 9.7.  WBC is 9.9.  Hemoglobin 16.2, hematocrit 47.7.  Platelet count is 416.  Lab review notes atypical lymphocytes on the peripheral smear.   IMPRESSION:  1. Chronic obstructive pulmonary disease exacerbation with persistent      dyspnea.  She has recently finished a course of Avelox, and      therefore I do not think she needs another course of antibiotics  at      this time.  I will switch her from prednisone to Solu-Medrol to see      if this gains further improvement.  I will continue her on Spiriva      and Ventolin.  I will switch her over to Advair as she appeared to      feel that this helped better than Symbicort.  2. Hyponatremia.  She is already on steroids, so I will defer to      checking a cortisol level.  I will check her TSH as well as a serum      osmolarity and urine osmolarity, and I will check her urinalysis.      I will start her on normal saline intravenous fluids and follow up      on her BMET.  3. Nocturnal hypoxemia.  I will continue her on supplemental oxygen.      She does have other symptoms, which would be suggestive of sleep      disorder breathing, and further evaluation with an overnight      polysomnogram as an outpatient may be warranted.  4. Hypertension.  Again she was not able to tolerate the Diovan.      Therefore I will continue her on Norvasc.  5. Atypical lymphocytes on CBC.  I will repeat the CBC with a      differential and have the pathologist review.  Depending on their      review, further interventions may be necessary.  6. Gastroparesis.  She has been seen as an outpatient by Dr.      Jarold Motto.  7. History of tobacco abuse.  I have discussed at length with her the      importance of maintaining her smoking      abstinence.  8. Question of cardiac cause of her dyspnea.  I will check an EKG.      Depending upon review of this, further evaluation may be necessary.      Coralyn Helling, MD  Electronically Signed     VS/MEDQ  D:  09/26/2007  T:  09/26/2007  Job:  [id number]   cc:   Charlaine Dalton. Sherene Sires, MD, FCCP  [address]  [place] [address]   Janna Arch, Dr.

## 2010-09-30 NOTE — Assessment & Plan Note (Signed)
[facility]                         GASTROENTEROLOGY OFFICE NOTE   Virginia Burns, Virginia Burns                      MRN:          [id number]  DATE:08/23/2007                            DOB:          Sep 29, 1945    Virginia Burns returns from her hospitalization where she was found to have  fungal esophagitis related to steroids and inhalers. She is also rather  hypokalemic and dehydrated.  She responded to rehydration and treatment  for her Candida esophagitis.  She is doing well at this time but does  continue to have some dry mouth and coated tongue, but no dysphagia or  odynophagia.  She has some mild nausea and continues on a gastroparesis  diet.  She otherwise has nonsystemic complaints.   She weighs 144 pounds and blood pressure is 100/64 and pulse is 100 and  regular.  Examination of the oropharynx was unremarkable.  General exam otherwise was unremarkable.   RECOMMENDATIONS:  1. Switch from Reglan to Domperidone 10 mg 30 minutes before meals 3      times a day and hopefully she will be able to get sublingual      metoclopramide to use in the future should she have a gastroparesis      attack.  2. Slowly advance diet as tolerated.  3. Continue Protonix 40 mg twice a day.  4. Duke's Magic Mouthwash, 2 tsp swish and swallow 4 times a day.  5. Continue other medications as mentioned above.  6. She also is seeing Pulmonary for chronic obstructive lung disease.     [name], MD, Caleen Essex, FAGA  Electronically Signed    DRP/MedQ  DD: 08/23/2007  DT: 08/23/2007  Job #: [id number]

## 2010-10-03 NOTE — Assessment & Plan Note (Signed)
[facility]                           GASTROENTEROLOGY OFFICE NOTE   NAME:KITCHENMerl, Guardino                    MRN:          [id number]  DATE:12/08/2005                            DOB:          Feb 12, 1946    Virginia Burns had post polypectomy syndrome with diarrhea, lower abdominal pain  and fever.  She currently is better with conservative management.  It  appeared that she had some colitis during her colonoscopy, but biopsies were  negative.  She had been on Lialda 2.4 g a day since her colonoscopy.  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  Since this polypectomy, she has had no further rectal bleeding.   She has a low-grade temperature today of 99.1 degrees.  Blood pressure  116/68, pulse 104 and regular.  Abdominal exam was unremarkable without  organomegaly, masses or tenderness.   ASSESSMENT:  1.  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  2.  Chronic diverticulosis with negative biopsies for segmental colitis.  3.  Chronic gastroparesis and gastrointestinal motility disturbance.   RECOMMENDATIONS:  1.  Check CBC and sedimentation rate.  2.  Cipro 500 mg twice a day x5-7 days.  3.  Discontinue Lialda.  4.  Other medications as previously reviewed.  5.  Followup colonoscopy in 3 years' time.                                   [name], MD, Clementeen Graham, [place]   DRP/MedQ  DD:  12/08/2005  DT:  12/08/2005  Job #:  [id number]

## 2010-10-03 NOTE — Assessment & Plan Note (Signed)
[facility]                           GASTROENTEROLOGY OFFICE NOTE   NAME:[name], [name]                    MRN:          [id number]  DATE:01/05/2006                            DOB:          [birth year]    [name] returns as asymptomatic.  She was treated for post coagulation  syndrome after a polypectomy with Cipro.  Her laboratory data was all  unremarkable, except for a slightly elevated sedimentation rate.  She  currently is back on her regular medications including Reglan and Protonix,  and is having no difficulties.  She does have some mixed hemorrhoids and has  some intermittent bleeding with straining.  She has local hemorrhoid salve  that she uses on a p.r.n. basis.  She is due for followup colonoscopy in 3  years' time.  I have asked her to take a high fiber diet as tolerated, which  is somewhat restricted in her case because of her gastroparesis which seems  to flare periodically.                                   [name], MD, Clementeen Graham, [place]   DRP/MedQ  DD:  01/05/2006  DT:  01/06/2006  Job #:  [id number]

## 2010-10-03 NOTE — Assessment & Plan Note (Signed)
[facility]                         GASTROENTEROLOGY OFFICE NOTE   NAME:KITCHENBrendan, Burns                    MRN:          [id number]  DATE:07/22/2006                            DOB:          06/04/1945    Virginia Burns has had 2 weeks of throbbing pain in her rectum unresponsive to  sitz baths and local creams.  She is doing well with her gastroparesis  and reflux symptoms on multiple medications.  Her vital signs today are  all stable.  Inspection of the rectum shows a large thrombosed  hemorrhoid in her perianal area.  I have referred her to [facility] for lancing and treatment of her hemorrhoids.  She will continue  her other medications as previously outlined.     Virginia Rea. Jarold Motto, MD, Caleen Essex, FAGA  Electronically Signed    DRP/MedQ  DD: 07/22/2006  DT: 07/22/2006  Job #: [id number]   cc:   [facility]

## 2010-10-28 ENCOUNTER — Telehealth: Payer: Self-pay | Admitting: Pulmonary Disease

## 2010-10-28 NOTE — Telephone Encounter (Signed)
Called and spoke with pt.  Pt states she went in for jury duty this morning and there were over 150+ people in the room and it was very hot in the room and pt states she had difficulty breathing.  Pt states she has COPD and wanted to know if Dr. Craige Cotta would write her a letter to excuse her from jury duty.  She is schedule to go back August 1.  VS please advise if you would be willing to do this for pt or not.  Thanks.

## 2010-11-12 ENCOUNTER — Encounter: Payer: Self-pay | Admitting: Pulmonary Disease

## 2010-11-12 NOTE — Telephone Encounter (Signed)
Please inform patient that I have completed letter.  I have placed this in my in box next to Lori's work station.

## 2010-11-13 NOTE — Telephone Encounter (Signed)
Pt aware and request letter be mailed. Placed letter in the mail. Carron Curie, CMA

## 2011-02-09 LAB — CBC
HCT: 40.1
MCHC: 33.7
MCV: 94
Platelets: 354
RDW: 14.8
WBC: 9.6

## 2011-02-09 LAB — COMPREHENSIVE METABOLIC PANEL
AST: 16
Albumin: 3.7
BUN: 2 — ABNORMAL LOW
Calcium: 9.2
Creatinine, Ser: 0.56
GFR calc Af Amer: >60
Total Bilirubin: 0.6
Total Protein: 6.1

## 2011-02-09 LAB — DIFFERENTIAL
Basophils Absolute: 0.1
Eosinophils Relative: 0
Lymphocytes Relative: 24
Lymphs Abs: 2.3
Monocytes Absolute: 0.7
Neutro Abs: 6.5

## 2011-02-09 LAB — BASIC METABOLIC PANEL
BUN: 1 — ABNORMAL LOW
Chloride: 103
GFR calc non Af Amer: >60
Glucose, Bld: 105 — ABNORMAL HIGH
Potassium: 3.7
Sodium: 135

## 2011-06-09 ENCOUNTER — Other Ambulatory Visit: Payer: Self-pay | Admitting: Gastroenterology

## 2011-09-09 ENCOUNTER — Other Ambulatory Visit: Payer: Self-pay | Admitting: Gastroenterology

## 2011-09-23 ENCOUNTER — Encounter: Payer: Self-pay | Admitting: *Deleted

## 2011-09-29 ENCOUNTER — Ambulatory Visit (INDEPENDENT_AMBULATORY_CARE_PROVIDER_SITE_OTHER): Payer: 59 | Admitting: Gastroenterology

## 2011-09-29 ENCOUNTER — Encounter: Payer: Self-pay | Admitting: Gastroenterology

## 2011-09-29 VITALS — BP 130/72 | HR 76 | Ht 66.0 in | Wt 200.0 lb

## 2011-09-29 DIAGNOSIS — E669 Obesity, unspecified: Secondary | ICD-10-CM

## 2011-09-29 DIAGNOSIS — K3184 Gastroparesis: Secondary | ICD-10-CM

## 2011-09-29 DIAGNOSIS — K219 Gastro-esophageal reflux disease without esophagitis: Secondary | ICD-10-CM

## 2011-09-29 DIAGNOSIS — K5730 Diverticulosis of large intestine without perforation or abscess without bleeding: Secondary | ICD-10-CM

## 2011-09-29 MED ORDER — AMBULATORY NON FORMULARY MEDICATION: Status: DC

## 2011-09-29 NOTE — Patient Instructions (Signed)
Your prescription(s) have been sent to you pharmacy, Domperidone to [facility] in [place].  You have signed a Release to have your labs sent from Dr Wallace Cullens.

## 2011-09-29 NOTE — Progress Notes (Signed)
Addended by: Harlow Mares D on: 09/29/2011 04:56 PM   Modules accepted: Orders

## 2011-09-29 NOTE — Progress Notes (Signed)
This is a extremely pleasant 66 year old Caucasian female with chronic gastroparesis currently doing well on domperidone 10 mg before meals and Protonix 40 mg a day. She denies gastroparesis symptoms while on therapy, and has been able to gain 20-30 pounds in weight. Previous colonoscopy was remarkable for a small right colon adenoma which was excised. She denies lower gastrointestinal or hepatobiliary symptoms. She is on multiple medications listed and reviewed her record. She apparently has yearly lab data performed that has been unremarkable.  Current Medications, Allergies, Past Medical History, Past Surgical History, Family History and Social History were reviewed in [employer] record.  Pertinent Review of Systems Negative   Physical Exam: Healthy-appearing patient in no distress. Blood pressure 130/72, pulse 76 and regular, weight 200 pounds, and BMI 32.28. I cannot appreciate stigmata of chronic liver disease. Her abdomen shows no organomegaly, masses, tenderness, or distention. I cannot appreciate a succussion splash. Mental status is normal.    Assessment and Plan: Chronic idiopathic gastroparesis doing well on prokinetic and PPI therapy. I have renewed her medications, and will ask her primary care descendents labs for review. She is not due for colonoscopy for several years. All be glad to see on a when necessary basis as needed. I do not think she needs endoscopy at this time. She is status post cholecystectomy, and has a prominent right upper quadrant scar with firmness at the age of her scar but no definite mass noted. Previous colonoscopy did show mild sigmoid colon diverticulosis. No diagnosis found.

## 2011-10-06 ENCOUNTER — Telehealth: Payer: Self-pay | Admitting: Gastroenterology

## 2011-10-06 ENCOUNTER — Encounter: Payer: Self-pay | Admitting: Internal Medicine

## 2011-10-06 NOTE — Telephone Encounter (Signed)
Error---Dr. Jarold Motto pt

## 2011-10-06 NOTE — Telephone Encounter (Signed)
Pt wants to know if we have received her labs from outside office.

## 2011-10-06 NOTE — Telephone Encounter (Signed)
Advised patient that I am unable to find any labs from [facility]. She will call and ask them to send labs again.

## 2011-10-09 ENCOUNTER — Telehealth: Payer: Self-pay | Admitting: Gastroenterology

## 2011-10-09 NOTE — Telephone Encounter (Signed)
Advised pt that I cannot find any lab results in places I know to look. Dr Norval Gable CMA may have received them, but she has left early. One of [place] will call her back on Tuesday, 10/13/11. Pt stated understanding. Hulan Saas, did you receive the labs? Thanks.

## 2011-10-13 NOTE — Telephone Encounter (Signed)
Pt will have labs sent to my attention I gave her the fax number. I will call her when Dr Jarold Motto reviews the labs.

## 2011-10-14 NOTE — Telephone Encounter (Signed)
Advised pt all labs normal and Dr Jarold Motto doesn't see any more need for more labs.

## 2012-02-02 ENCOUNTER — Other Ambulatory Visit: Payer: Self-pay | Admitting: Gastroenterology

## 2012-10-22 DIAGNOSIS — G4700 Insomnia, unspecified: Secondary | ICD-10-CM | POA: Insufficient documentation

## 2012-10-22 HISTORY — DX: Insomnia, unspecified: G47.00

## 2012-10-27 ENCOUNTER — Telehealth: Payer: Self-pay | Admitting: *Deleted

## 2012-10-27 MED ORDER — AMBULATORY NON FORMULARY MEDICATION: Status: DC

## 2012-10-27 NOTE — Telephone Encounter (Signed)
Pt reports she can't get a refill on domperidome; informed her we are now ordering from [facility]. Ordered med.

## 2012-12-09 DIAGNOSIS — E559 Vitamin D deficiency, unspecified: Secondary | ICD-10-CM

## 2012-12-09 DIAGNOSIS — M1990 Unspecified osteoarthritis, unspecified site: Secondary | ICD-10-CM

## 2012-12-09 DIAGNOSIS — K5900 Constipation, unspecified: Secondary | ICD-10-CM

## 2012-12-09 HISTORY — DX: Constipation, unspecified: K59.00

## 2012-12-09 HISTORY — DX: Vitamin D deficiency, unspecified: E55.9

## 2012-12-09 HISTORY — DX: Unspecified osteoarthritis, unspecified site: M19.90

## 2012-12-19 ENCOUNTER — Ambulatory Visit (INDEPENDENT_AMBULATORY_CARE_PROVIDER_SITE_OTHER): Payer: Medicare Other | Admitting: Pulmonary Disease

## 2012-12-19 ENCOUNTER — Ambulatory Visit (INDEPENDENT_AMBULATORY_CARE_PROVIDER_SITE_OTHER)
Admission: RE | Admit: 2012-12-19 | Discharge: 2012-12-19 | Disposition: A | Discharge status: Home / Self Care | Payer: Medicare Other | Source: Ambulatory Visit | Attending: Pulmonary Disease | Admitting: Pulmonary Disease

## 2012-12-19 ENCOUNTER — Encounter: Payer: Self-pay | Admitting: Pulmonary Disease

## 2012-12-19 VITALS — BP 120/84 | HR 80 | Temp 98.0°F | Ht 66.0 in | Wt 212.0 lb

## 2012-12-19 DIAGNOSIS — J449 Chronic obstructive pulmonary disease, unspecified: Secondary | ICD-10-CM

## 2012-12-19 DIAGNOSIS — J4489 Other specified chronic obstructive pulmonary disease: Secondary | ICD-10-CM

## 2012-12-19 MED ORDER — IPRATROPIUM BROMIDE 0.02 % IN SOLN: 500.0000 ug | Freq: Four times a day (QID) | RESPIRATORY_TRACT | Status: DC | PRN

## 2012-12-19 MED ORDER — UMECLIDINIUM-VILANTEROL 62.5-25 MCG/INH IN AEPB: 1.0000 | INHALATION_SPRAY | Freq: Every day | RESPIRATORY_TRACT | Status: DC

## 2012-12-19 NOTE — Progress Notes (Signed)
Chief Complaint  Patient presents with  . Acute Visit    Last seen 2011. Reports increased SOB and wheezing. Onset was 9 days. PCP put her on neb meds and pred taper.    History of Present Illness: Virginia Burns is a 67 y.o. female former smoker with COPD.  I last saw Virginia Burns in 2011.  She developed wheezing and increased dyspnea 10 days ago.  She had been using spiriva and symbicort.  She was changed to albuterol/atrovent by nebulizer and prednisone taper.  She has finished prednisone, and this helped.  However, she had persistent feelings of difficulty getting air into her lungs.  She went back to her PCP for additional prednisone, and was advised she should follow up with pulmonary.  She has not been on antibiotics recently.  She thinks she got a cold from her sister.  She denies fever, sputum, sore throat, sinus congestion, leg swelling, sputum, or hemoptysis.  She is using nebulizer twice per day >> this helps, but is very difficult to keep up with in relation to her work schedule.  She likes inhalers better.  She has frequent trouble with thrush from symbicort >> she does rinse her mouth.  She has not had recent PFT or chest xray.  She does okay with walking on level ground.  She gets winded when going up steps or carrying things.  TESTS: PFT 08/09/07 >> FEV1 1.54 (63%), FVC 2.90 (88%), FEV1% 53, TLC 3.42 (65%), DLCO 64%, no BD   Virginia Burns  has a past medical history of Personal history of colonic polyps (10/07/2009); Vitamin B12 deficiency; Gastroparesis; Diverticulosis of colon (without mention of hemorrhage); Family history of malignant neoplasm of gastrointestinal tract; Osteoporosis; Hypertension; Other esophagitis; and COPD (chronic obstructive pulmonary disease).  Virginia Burns  has past surgical history that includes Cholecystectomy and Abdominal hysterectomy.  Prior to Admission medications   Medication Sig Start Date End Date Taking? Authorizing  Provider  albuterol (PROVENTIL HFA;VENTOLIN HFA) 108 (90 BASE) MCG/ACT inhaler Inhale 2 puffs into the lungs every 4 (four) hours as needed.   Yes Historical Provider, MD  albuterol (PROVENTIL) (2.5 MG/3ML) 0.083% nebulizer solution Take 2.5 mg by nebulization every 6 (six) hours as needed for wheezing.   Yes Historical Provider, MD  AMBULATORY NON FORMULARY MEDICATION Domperidone 10mg  take one tablet by mouth three times a day before meals 10/27/12  Yes Mardella Layman, MD  amLODipine (NORVASC) 5 MG tablet Take 5 mg by mouth daily.   Yes Historical Provider, MD  aspirin 81 MG tablet Take 81 mg by mouth daily.   Yes Historical Provider, MD  budesonide-formoterol (SYMBICORT) 160-4.5 MCG/ACT inhaler Inhale 2 puffs into the lungs 2 (two) times daily.   Yes Historical Provider, MD  Calcium Carbonate-Vit D-Min (CALCIUM-VITAMIN D-MINERALS) 600-400 MG-UNIT CHEW Chew 1 tablet by mouth daily.   Yes Historical Provider, MD  cholecalciferol (VITAMIN D) 1000 UNITS tablet Take 1,000 Units by mouth daily.   Yes Historical Provider, MD  Cholecalciferol (VITAMIN D3) 3000 UNITS TABS Take 1 tablet by mouth daily.   Yes Historical Provider, MD  Domperidone BP POWD TAKE ONE CAPSULE BY MOUTH THREE TIMES A DAY BEFORE MEALS 02/02/12  Yes Mardella Layman, MD  Flaxseed, Linseed, (FLAX SEED OIL) 1000 MG CAPS Take 1 capsule by mouth daily.   Yes Historical Provider, MD  ipratropium (ATROVENT) 0.02 % nebulizer solution Take 500 mcg by nebulization 2 (two) times daily.   Yes Historical Provider, MD  levocetirizine Elita Boone)  5 MG tablet Take 5 mg by mouth every evening.   Yes Historical Provider, MD  LORazepam (ATIVAN) 0.5 MG tablet Take 0.5 mg by mouth as needed.   Yes Historical Provider, MD  Magnesium 250 MG TABS Take 1 tablet by mouth daily.   Yes Historical Provider, MD  Multiple Vitamin (MULTIVITAMIN) capsule Take 1 capsule by mouth daily.   Yes Historical Provider, MD  pantoprazole (PROTONIX) 40 MG tablet Take 40 mg by  mouth daily.   Yes Historical Provider, MD  tiotropium (SPIRIVA HANDIHALER) 18 MCG inhalation capsule Place 18 mcg into inhaler and inhale daily.   Yes Historical Provider, MD  vitamin C (ASCORBIC ACID) 500 MG tablet Take 500 mg by mouth daily.   Yes Historical Provider, MD  vitamin E 400 UNIT capsule Take 400 Units by mouth daily.   Yes Historical Provider, MD    Allergies  Allergen Reactions  . Clindamycin/Lincomycin      Physical Exam:  General - No distress ENT - No sinus tenderness, no oral exudate, no LAN Cardiac - s1s2 regular, no murmur Chest - Prolonged exhalation, no wheeze Back - No focal tenderness Abd - Soft, non-tender Ext - No edema Neuro - Normal strength Skin - No rashes Psych - normal mood, and behavior   Assessment/Plan:  Coralyn Helling, MD [facility] Pager:  [phone]

## 2012-12-19 NOTE — Assessment & Plan Note (Signed)
She has recent exacerbation, and is slowly improving.  She reports frequent episodes of thrush.    I don't think she needs additional antibiotics at this time.  Will repeat PFT and chest xray today.  Will have her try anoro for two weeks in place of symbicort and spiriva.  She is to call in two weeks to update whether she wants refill of anoro, or to change back to symbicort and spiriva.  Advised that she can use albuterol/ipratropium nebulizer as needed.  She can use ventolin as needed also.  Encouraged her to continue with her plan for increased exercise.

## 2012-12-19 NOTE — Patient Instructions (Signed)
Anoro one puff daily Do not use spiriva or symbicort while using Anoro Albuterol/ipratropium in nebulizer up to four times per day as needed Albuterol inhaler two puffs up to four times per day as needed Chest xray today Will schedule breathing test (PFT) Follow up in 8 weeks

## 2012-12-20 ENCOUNTER — Telehealth: Payer: Self-pay | Admitting: Pulmonary Disease

## 2012-12-20 NOTE — Telephone Encounter (Signed)
Pt is aware of CXR results.

## 2012-12-20 NOTE — Telephone Encounter (Signed)
Dg Chest 2 View  12/19/2012   *RADIOLOGY REPORT*  Clinical Data: Shortness of breath, COPD, hypertension.  CHEST - 2 VIEW  Comparison: 09/26/2007  Findings: There is hyperinflation of the lungs compatible with COPD.  Scarring in the lung bases.  Heart is normal size.  No acute opacities or effusions.  No acute bony abnormality.  IMPRESSION: COPD/chronic changes.  No acute findings.   Original Report Authenticated By: Charlett Nose, M.D.    Will have my nurse inform pt that CXR shows expected changes with hx of COPD.  No other worrisome findings.  No change to current tx plan.

## 2012-12-23 ENCOUNTER — Telehealth: Payer: Self-pay | Admitting: Gastroenterology

## 2012-12-23 MED ORDER — AMBULATORY NON FORMULARY MEDICATION: Status: DC

## 2012-12-23 NOTE — Telephone Encounter (Signed)
I called patient and advised that we have a new pharmacy in [place] where we are sending the Domperidone  Patient verbalized understanding  RX sent

## 2012-12-26 ENCOUNTER — Telehealth: Payer: Self-pay | Admitting: Pulmonary Disease

## 2012-12-26 ENCOUNTER — Encounter (HOSPITAL_COMMUNITY): Payer: Self-pay | Admitting: *Deleted

## 2012-12-26 ENCOUNTER — Inpatient Hospital Stay (HOSPITAL_COMMUNITY)
Admission: EM | Admit: 2012-12-26 | Discharge: 2012-12-31 | DRG: 190 | Disposition: A | Discharge status: Home / Self Care | Payer: Medicare Other | Attending: Internal Medicine | Admitting: Internal Medicine

## 2012-12-26 DIAGNOSIS — Z87891 Personal history of nicotine dependence: Secondary | ICD-10-CM

## 2012-12-26 DIAGNOSIS — F411 Generalized anxiety disorder: Secondary | ICD-10-CM | POA: Diagnosis present

## 2012-12-26 DIAGNOSIS — J9601 Acute respiratory failure with hypoxia: Secondary | ICD-10-CM

## 2012-12-26 DIAGNOSIS — I10 Essential (primary) hypertension: Secondary | ICD-10-CM

## 2012-12-26 DIAGNOSIS — Z79899 Other long term (current) drug therapy: Secondary | ICD-10-CM

## 2012-12-26 DIAGNOSIS — K208 Other esophagitis without bleeding: Secondary | ICD-10-CM

## 2012-12-26 DIAGNOSIS — J441 Chronic obstructive pulmonary disease with (acute) exacerbation: Principal | ICD-10-CM

## 2012-12-26 DIAGNOSIS — J9600 Acute respiratory failure, unspecified whether with hypoxia or hypercapnia: Secondary | ICD-10-CM | POA: Diagnosis present

## 2012-12-26 DIAGNOSIS — F17200 Nicotine dependence, unspecified, uncomplicated: Secondary | ICD-10-CM

## 2012-12-26 DIAGNOSIS — K3184 Gastroparesis: Secondary | ICD-10-CM | POA: Diagnosis present

## 2012-12-26 DIAGNOSIS — J189 Pneumonia, unspecified organism: Secondary | ICD-10-CM | POA: Diagnosis present

## 2012-12-26 DIAGNOSIS — E538 Deficiency of other specified B group vitamins: Secondary | ICD-10-CM | POA: Diagnosis present

## 2012-12-26 DIAGNOSIS — Z7982 Long term (current) use of aspirin: Secondary | ICD-10-CM

## 2012-12-26 DIAGNOSIS — M810 Age-related osteoporosis without current pathological fracture: Secondary | ICD-10-CM | POA: Diagnosis present

## 2012-12-26 DIAGNOSIS — K2100 Gastro-esophageal reflux disease with esophagitis, without bleeding: Secondary | ICD-10-CM | POA: Diagnosis present

## 2012-12-26 DIAGNOSIS — G4733 Obstructive sleep apnea (adult) (pediatric): Secondary | ICD-10-CM

## 2012-12-26 NOTE — Telephone Encounter (Signed)
Called spoke with patient, advised of MW's recs as stated below.  Pt verbalized her understanding and denied any questions.  MW with opening tomorrow or later this week > pt prefers to be seen tomorrow > 0930.

## 2012-12-26 NOTE — Telephone Encounter (Signed)
Spoke to pt. States that last night after using Anoro she want to bed and woke up very short of breath. She was very scared and had a panic attack. Had to use Ventolin twice. This morning she used her neb machine and then used Anoro 30 mintues later. Her son noticed her gasping for air and made her lay down. She wants to know what she should do. Reports coughing with no production of mucus.  MW - please advise. Thanks.

## 2012-12-26 NOTE — ED Notes (Signed)
Pt states started having worsening shortness of breath last night, states lung doctor changed her medication recently and is not working, states has an appt in morning to see doctor but couldn't wait that long, needed oxygen, states oxygen level at home was 85%, pt on 2L Virginia Burns and states feels much better.

## 2012-12-26 NOTE — Telephone Encounter (Signed)
Stop anoro and just use the neb up to q4 h for now then ov with all meds in hand to regroup re cough/sob control - either me or Tammy NP

## 2012-12-27 ENCOUNTER — Ambulatory Visit: Payer: Medicare Other | Admitting: Internal Medicine

## 2012-12-27 ENCOUNTER — Emergency Department (HOSPITAL_COMMUNITY): Payer: Medicare Other

## 2012-12-27 DIAGNOSIS — J9601 Acute respiratory failure with hypoxia: Secondary | ICD-10-CM | POA: Diagnosis present

## 2012-12-27 DIAGNOSIS — I10 Essential (primary) hypertension: Secondary | ICD-10-CM

## 2012-12-27 DIAGNOSIS — J441 Chronic obstructive pulmonary disease with (acute) exacerbation: Secondary | ICD-10-CM | POA: Diagnosis present

## 2012-12-27 DIAGNOSIS — K208 Other esophagitis: Secondary | ICD-10-CM

## 2012-12-27 DIAGNOSIS — J9600 Acute respiratory failure, unspecified whether with hypoxia or hypercapnia: Secondary | ICD-10-CM

## 2012-12-27 DIAGNOSIS — G4733 Obstructive sleep apnea (adult) (pediatric): Secondary | ICD-10-CM

## 2012-12-27 DIAGNOSIS — F17200 Nicotine dependence, unspecified, uncomplicated: Secondary | ICD-10-CM

## 2012-12-27 LAB — POCT I-STAT, CHEM 8
Creatinine, Ser: 0.8 mg/dL (ref 0.50–1.10)
Glucose, Bld: 113 mg/dL — ABNORMAL HIGH (ref 70–99)
Hemoglobin: 15 g/dL (ref 12.0–15.0)
Potassium: 4 mEq/L (ref 3.5–5.1)
TCO2: 27 mmol/L (ref 0–100)

## 2012-12-27 LAB — URINALYSIS, ROUTINE W REFLEX MICROSCOPIC
Bilirubin Urine: NEGATIVE
Leukocytes, UA: NEGATIVE
Nitrite: NEGATIVE
Specific Gravity, Urine: 1.009 (ref 1.005–1.030)
pH: 6 (ref 5.0–8.0)

## 2012-12-27 LAB — CBC
HCT: 39.2 % (ref 36.0–46.0)
MCHC: 32.4 g/dL (ref 30.0–36.0)
MCV: 92 fL (ref 78.0–100.0)
Platelets: 357 10*3/uL (ref 150–400)
RDW: 13.5 % (ref 11.5–15.5)

## 2012-12-27 LAB — CBC WITH DIFFERENTIAL/PLATELET
Basophils Relative: 0 % (ref 0–1)
Eosinophils Absolute: 0.1 10*3/uL (ref 0.0–0.7)
HCT: 40.3 % (ref 36.0–46.0)
Hemoglobin: 13.3 g/dL (ref 12.0–15.0)
MCH: 30.3 pg (ref 26.0–34.0)
MCHC: 33 g/dL (ref 30.0–36.0)
Monocytes Absolute: 0.9 10*3/uL (ref 0.1–1.0)
Monocytes Relative: 7 % (ref 3–12)

## 2012-12-27 LAB — PRO B NATRIURETIC PEPTIDE: Pro B Natriuretic peptide (BNP): 102.4 pg/mL (ref 0–125)

## 2012-12-27 LAB — TROPONIN I: Troponin I: <0.3 ng/mL (ref <0.30)

## 2012-12-27 LAB — TSH: TSH: 2.058 u[IU]/mL (ref 0.350–4.500)

## 2012-12-27 MED ORDER — LEVOFLOXACIN IN D5W 500 MG/100ML IV SOLN
500.0000 mg | Freq: Every day | INTRAVENOUS | Status: DC
  Administered 2012-12-27 – 2012-12-28 (×2): 500 mg via INTRAVENOUS
  Filled 2012-12-27 (×4): qty 100

## 2012-12-27 MED ORDER — VITAMIN E 180 MG (400 UNIT) PO CAPS
400.0000 [IU] | ORAL_CAPSULE | Freq: Every day | ORAL | Status: DC
  Administered 2012-12-28 – 2012-12-31 (×4): 400 [IU] via ORAL
  Filled 2012-12-27 (×5): qty 1

## 2012-12-27 MED ORDER — VITAMIN D3 25 MCG (1000 UNIT) PO TABS
1000.0000 [IU] | ORAL_TABLET | Freq: Every day | ORAL | Status: DC
  Administered 2012-12-28 – 2012-12-31 (×4): 1000 [IU] via ORAL
  Filled 2012-12-27 (×5): qty 1

## 2012-12-27 MED ORDER — PANTOPRAZOLE SODIUM 40 MG PO TBEC
40.0000 mg | DELAYED_RELEASE_TABLET | Freq: Two times a day (BID) | ORAL | Status: DC
  Administered 2012-12-27 – 2012-12-31 (×9): 40 mg via ORAL
  Filled 2012-12-27 (×12): qty 1

## 2012-12-27 MED ORDER — IPRATROPIUM BROMIDE 0.02 % IN SOLN
0.5000 mg | Freq: Four times a day (QID) | RESPIRATORY_TRACT | Status: DC
  Administered 2012-12-27: 0.5 mg via RESPIRATORY_TRACT
  Filled 2012-12-27: qty 2.5

## 2012-12-27 MED ORDER — GUAIFENESIN ER 600 MG PO TB12
600.0000 mg | ORAL_TABLET | Freq: Two times a day (BID) | ORAL | Status: DC
  Administered 2012-12-27 – 2012-12-31 (×9): 600 mg via ORAL
  Filled 2012-12-27 (×10): qty 1

## 2012-12-27 MED ORDER — SENNA 8.6 MG PO TABS
1.0000 | ORAL_TABLET | Freq: Two times a day (BID) | ORAL | Status: DC
  Administered 2012-12-27 – 2012-12-31 (×9): 8.6 mg via ORAL
  Filled 2012-12-27 (×9): qty 1

## 2012-12-27 MED ORDER — SODIUM CHLORIDE 0.9 % IV SOLN: INTRAVENOUS | Status: AC

## 2012-12-27 MED ORDER — ALBUTEROL SULFATE (5 MG/ML) 0.5% IN NEBU
2.5000 mg | INHALATION_SOLUTION | Freq: Four times a day (QID) | RESPIRATORY_TRACT | Status: DC
  Administered 2012-12-27 – 2012-12-31 (×14): 2.5 mg via RESPIRATORY_TRACT
  Filled 2012-12-27 (×14): qty 0.5

## 2012-12-27 MED ORDER — METHYLPREDNISOLONE SODIUM SUCC 125 MG IJ SOLR
60.0000 mg | Freq: Two times a day (BID) | INTRAMUSCULAR | Status: DC
  Administered 2012-12-27 – 2012-12-28 (×3): 60 mg via INTRAVENOUS
  Filled 2012-12-27 (×5): qty 0.96

## 2012-12-27 MED ORDER — ASPIRIN EC 81 MG PO TBEC
81.0000 mg | DELAYED_RELEASE_TABLET | Freq: Every day | ORAL | Status: DC
  Administered 2012-12-27 – 2012-12-31 (×5): 81 mg via ORAL
  Filled 2012-12-27 (×5): qty 1

## 2012-12-27 MED ORDER — METHOCARBAMOL 500 MG PO TABS: 750.0000 mg | ORAL_TABLET | Freq: Every evening | ORAL | Status: DC | PRN

## 2012-12-27 MED ORDER — IPRATROPIUM BROMIDE 0.02 % IN SOLN
0.5000 mg | Freq: Four times a day (QID) | RESPIRATORY_TRACT | Status: DC
Start: 2012-12-27 — End: 2012-12-31
  Administered 2012-12-27 – 2012-12-31 (×14): 0.5 mg via RESPIRATORY_TRACT
  Filled 2012-12-27 (×14): qty 2.5

## 2012-12-27 MED ORDER — SODIUM CHLORIDE 0.9 % IV SOLN
INTRAVENOUS | Status: DC
  Administered 2012-12-27 – 2012-12-28 (×2): via INTRAVENOUS

## 2012-12-27 MED ORDER — ADULT MULTIVITAMIN W/MINERALS CH
1.0000 | ORAL_TABLET | Freq: Every day | ORAL | Status: DC
  Administered 2012-12-28 – 2012-12-31 (×4): 1 via ORAL
  Filled 2012-12-27 (×5): qty 1

## 2012-12-27 MED ORDER — ONDANSETRON HCL 4 MG/2ML IJ SOLN
4.0000 mg | Freq: Four times a day (QID) | INTRAMUSCULAR | Status: DC | PRN
Start: 2012-12-27 — End: 2012-12-31

## 2012-12-27 MED ORDER — MAGNESIUM OXIDE 400 (241.3 MG) MG PO TABS
200.0000 mg | ORAL_TABLET | Freq: Every day | ORAL | Status: DC
  Administered 2012-12-27 – 2012-12-29 (×3): 200 mg via ORAL
  Administered 2012-12-29: 10:00:00 via ORAL
  Administered 2012-12-30 – 2012-12-31 (×2): 200 mg via ORAL
  Filled 2012-12-27 (×5): qty 0.5

## 2012-12-27 MED ORDER — METOCLOPRAMIDE HCL 5 MG PO TABS
5.0000 mg | ORAL_TABLET | Freq: Three times a day (TID) | ORAL | Status: DC
  Filled 2012-12-27 (×5): qty 1

## 2012-12-27 MED ORDER — VITAMIN C 500 MG PO TABS
500.0000 mg | ORAL_TABLET | Freq: Every day | ORAL | Status: DC
  Administered 2012-12-28 – 2012-12-31 (×4): 500 mg via ORAL
  Filled 2012-12-27 (×5): qty 1

## 2012-12-27 MED ORDER — ONDANSETRON HCL 4 MG PO TABS: 4.0000 mg | ORAL_TABLET | Freq: Four times a day (QID) | ORAL | Status: DC | PRN

## 2012-12-27 MED ORDER — AMLODIPINE BESYLATE 5 MG PO TABS
7.5000 mg | ORAL_TABLET | Freq: Every day | ORAL | Status: DC
  Administered 2012-12-27 – 2012-12-31 (×5): 7.5 mg via ORAL
  Filled 2012-12-27 (×5): qty 1

## 2012-12-27 MED ORDER — SODIUM CHLORIDE 0.9 % IJ SOLN
3.0000 mL | Freq: Two times a day (BID) | INTRAMUSCULAR | Status: DC
  Administered 2012-12-28 – 2012-12-30 (×5): 3 mL via INTRAVENOUS

## 2012-12-27 MED ORDER — LORAZEPAM 0.5 MG PO TABS
0.5000 mg | ORAL_TABLET | ORAL | Status: DC | PRN
  Administered 2012-12-27 – 2012-12-30 (×4): 0.5 mg via ORAL
  Filled 2012-12-27 (×4): qty 1

## 2012-12-27 MED ORDER — IPRATROPIUM BROMIDE 0.02 % IN SOLN
0.5000 mg | RESPIRATORY_TRACT | Status: DC | PRN
  Filled 2012-12-27: qty 2.5

## 2012-12-27 MED ORDER — ALUM & MAG HYDROXIDE-SIMETH 200-200-20 MG/5ML PO SUSP: 30.0000 mL | Freq: Four times a day (QID) | ORAL | Status: DC | PRN

## 2012-12-27 MED ORDER — ENOXAPARIN SODIUM 40 MG/0.4ML ~~LOC~~ SOLN
40.0000 mg | SUBCUTANEOUS | Status: DC
  Administered 2012-12-27 – 2012-12-31 (×5): 40 mg via SUBCUTANEOUS
  Filled 2012-12-27 (×5): qty 0.4

## 2012-12-27 MED ORDER — ALBUTEROL SULFATE (5 MG/ML) 0.5% IN NEBU: 2.5000 mg | INHALATION_SOLUTION | RESPIRATORY_TRACT | Status: DC | PRN

## 2012-12-27 MED ORDER — MORPHINE SULFATE 2 MG/ML IJ SOLN: 1.0000 mg | INTRAMUSCULAR | Status: DC | PRN

## 2012-12-27 MED ORDER — METHYLPREDNISOLONE SODIUM SUCC 125 MG IJ SOLR
125.0000 mg | Freq: Once | INTRAMUSCULAR | Status: AC
  Administered 2012-12-27: 125 mg via INTRAVENOUS
  Filled 2012-12-27: qty 2

## 2012-12-27 MED ORDER — PANTOPRAZOLE SODIUM 40 MG PO TBEC: 40.0000 mg | DELAYED_RELEASE_TABLET | Freq: Two times a day (BID) | ORAL | Status: DC

## 2012-12-27 MED ORDER — ACETAMINOPHEN 650 MG RE SUPP: 650.0000 mg | Freq: Four times a day (QID) | RECTAL | Status: DC | PRN

## 2012-12-27 MED ORDER — ALBUTEROL SULFATE (5 MG/ML) 0.5% IN NEBU
2.5000 mg | INHALATION_SOLUTION | Freq: Four times a day (QID) | RESPIRATORY_TRACT | Status: DC
  Administered 2012-12-27: 2.5 mg via RESPIRATORY_TRACT
  Filled 2012-12-27: qty 0.5

## 2012-12-27 MED ORDER — CALCIUM CARBONATE-VITAMIN D 500-200 MG-UNIT PO TABS
1.0000 | ORAL_TABLET | Freq: Every day | ORAL | Status: DC
  Administered 2012-12-28 – 2012-12-31 (×4): 1 via ORAL
  Filled 2012-12-27 (×5): qty 1

## 2012-12-27 MED ORDER — ALBUTEROL SULFATE (5 MG/ML) 0.5% IN NEBU
5.0000 mg | INHALATION_SOLUTION | Freq: Once | RESPIRATORY_TRACT | Status: AC
  Administered 2012-12-27: 5 mg via RESPIRATORY_TRACT
  Filled 2012-12-27: qty 1

## 2012-12-27 MED ORDER — ACETAMINOPHEN 325 MG PO TABS: 650.0000 mg | ORAL_TABLET | Freq: Four times a day (QID) | ORAL | Status: DC | PRN

## 2012-12-27 NOTE — Evaluation (Signed)
Physical Therapy Evaluation Patient Details Name: Virginia Burns MRN: [id number] DOB: March 03, 1946 Today's Date: 12/27/2012 Time: 3086-5784 PT Time Calculation (min): 14 min  PT Assessment / Plan / Recommendation SATURATION QUALIFICATIONS:   Patient Saturations on Room Air at Rest = 96%  Patient Saturations on Room Air while Ambulating = 94%   History of Present Illness  67 y.o. female with medical history significant for COPD, gastroparesis, hypertension and diverticulitis who presents to the emergency department with a 3 to 4-day history of progressive shortness of breath, worse with exertion.   Clinical Impression  On eval, pt required Min guard assist for mobility-able to ambulate ~60 feet x 2 (seated rest break needed) without assistive device. Decreased activity tolerance noted. O2 sats 94% on RA, HR 133 bpm during ambulation; dyspnea 3/4. Will follow during stay. Do not anticipate any follow up PT needs at discharge.     PT Assessment  Patient needs continued PT services    Follow Up Recommendations  No PT follow up    Does the patient have the potential to tolerate intense rehabilitation      Barriers to Discharge        Equipment Recommendations  None recommended by PT    Recommendations for Other Services OT consult   Frequency Min 3X/week    Precautions / Restrictions Precautions Precautions: None Restrictions Weight Bearing Restrictions: No   Pertinent Vitals/Pain       Mobility  Bed Mobility Bed Mobility: Not assessed Transfers Transfers: Sit to Stand;Stand to Sit Sit to Stand: From chair/3-in-1;6: Modified independent (Device/Increase time) Stand to Sit: To chair/3-in-1;6: Modified independent (Device/Increase time) Ambulation/Gait Ambulation/Gait Assistance: 4: Min guard Ambulation Distance (Feet): 65 Feet (x 2) Assistive device: None Ambulation/Gait Assistance Details: Seated rest break between walks. O2 sats 94% RA, HR 133 bpm during  ambulation. Dyspnea 3/4. slow gait speed. fatigues fairly easily.  Gait Pattern: Step-through pattern;Decreased stride length    Exercises     PT Diagnosis: Difficulty walking  PT Problem List: Decreased activity tolerance;Decreased mobility;Cardiopulmonary status limiting activity PT Treatment Interventions: Gait training;Functional mobility training;Therapeutic exercise;Therapeutic activities;Patient/family education     PT Goals(Current goals can be found in the care plan section) Acute Rehab PT Goals Patient Stated Goal: breathe better. back to work.  PT Goal Formulation: With patient Time For Goal Achievement: 01/10/13 Potential to Achieve Goals: Good  Visit Information  Last PT Received On: 12/27/12 Assistance Needed: +1 History of Present Illness: 67 y.o. female with medical history significant for COPD, gastroparesis, hypertension and diverticulitis who presents to the emergency department with a 3 to 4-day history of progressive shortness of breath, worse with exertion.        Prior Functioning  Home Living Family/patient expects to be discharged to:: Private residence Living Arrangements: Children Available Help at Discharge: Family Type of Home: House Home Access: Stairs to enter Secretary/administrator of Steps: 3 Entrance Stairs-Rails: Right Home Layout: One level Home Equipment: None Prior Function Level of Independence: Independent Communication Communication: No difficulties    Cognition  Cognition Arousal/Alertness: Awake/alert Behavior During Therapy: WFL for tasks assessed/performed Overall Cognitive Status: Within Functional Limits for tasks assessed    Extremity/Trunk Assessment Upper Extremity Assessment Upper Extremity Assessment: Defer to OT evaluation Lower Extremity Assessment Lower Extremity Assessment: Overall WFL for tasks assessed Cervical / Trunk Assessment Cervical / Trunk Assessment: Normal   Balance    End of Session PT - End of  Session Activity Tolerance: Patient limited by fatigue Patient left: in chair;with  call bell/phone within reach;with family/visitor present  GP     Rebeca Alert, MPT Pager: [phone]

## 2012-12-27 NOTE — ED Notes (Signed)
Pt ambulated to bathroom and back. Pt's starting oxygen was 93 and heart rate was 115, pts ending oxygen was 88 and heart rate was 130. Pt was having a difficulty breathing by the end of her walk.

## 2012-12-27 NOTE — ED Provider Notes (Signed)
CSN: [id number]     Arrival date & time 12/26/12  2305 History     First MD Initiated Contact with Patient 12/27/12 0015     Chief Complaint  Patient presents with  . Shortness of Breath   (Consider location/radiation/quality/duration/timing/severity/associated sxs/prior Treatment) HPI Comments: Patient presents with shortness of breath. She has a history of COPD and has had worsening shortness of breath of the last 2-3 weeks. She states initially she had a flareup of her COPD and was put on a course of prednisone by her primary care physician. She finished this over a week ago. She was feeling better but still having more shortness of breath than usual and subsequently followed up with her pulmonologist. He decided to switch her off of her spiriva and Symbicort and started her on a new combination medication. She feels like this is not been working effectively for her. She has had a worsening cough which is mostly nonproductive. She earlier had an oxygen saturation is 85% and she is on no home oxygen. She's been using her albuterol nebulizer machines at home and they have not been effective. She denies any chest pain or tightness. She denies he fevers or chills. She has appointment to followup with her pulmonologist tomorrow but felt like her symptoms were to wait until then. She denies any leg pain or swelling.  Patient is a 67 y.o. female presenting with shortness of breath.  Shortness of Breath Associated symptoms: cough and wheezing   Associated symptoms: no abdominal pain, no chest pain, no diaphoresis, no fever, no headaches, no rash and no vomiting     Past Medical History  Diagnosis Date  . Personal history of colonic polyps 10/07/2009    tubular adenoma  . Vitamin B12 deficiency   . Gastroparesis   . Diverticulosis of colon (without mention of hemorrhage)   . Family history of malignant neoplasm of gastrointestinal tract   . Osteoporosis   . Hypertension   . Other esophagitis    . COPD (chronic obstructive pulmonary disease)    Past Surgical History  Procedure Laterality Date  . Cholecystectomy    . Abdominal hysterectomy     Family History  Problem Relation Age of Onset  . COPD Mother   . Colon cancer Cousin   . Diabetes Brother   . Colitis Son    History  Substance Use Topics  . Smoking status: Former Smoker -- 1.00 packs/day for 25 years    Types: Cigarettes    Quit date: 05/18/2010  . Smokeless tobacco: Never Used  . Alcohol Use: Yes     Comment: 2-3 every night 3 x week   OB History   Grav Para Term Preterm Abortions TAB SAB Ect Mult Living                 Review of Systems  Constitutional: Positive for fatigue. Negative for fever, chills and diaphoresis.  HENT: Negative for congestion, rhinorrhea and sneezing.   Eyes: Negative.   Respiratory: Positive for cough, shortness of breath and wheezing. Negative for chest tightness.   Cardiovascular: Negative for chest pain and leg swelling.  Gastrointestinal: Negative for nausea, vomiting, abdominal pain, diarrhea and blood in stool.  Genitourinary: Negative for frequency, hematuria, flank pain and difficulty urinating.  Musculoskeletal: Negative for back pain and arthralgias.  Skin: Negative for rash.  Neurological: Negative for dizziness, speech difficulty, weakness, numbness and headaches.    Allergies  Clindamycin/lincomycin  Home Medications   Current Outpatient Rx  Name  Route  Sig  Dispense  Refill  . albuterol (PROVENTIL HFA;VENTOLIN HFA) 108 (90 BASE) MCG/ACT inhaler   Inhalation   Inhale 2 puffs into the lungs every 4 (four) hours as needed.         Marland Dsouza albuterol (PROVENTIL) (2.5 MG/3ML) 0.083% nebulizer solution   Nebulization   Take 2.5 mg by nebulization every 6 (six) hours as needed for wheezing.         Marland Yebra amLODipine (NORVASC) 5 MG tablet   Oral   Take 7.5 mg by mouth daily.          Marland Rottenberg aspirin 81 MG tablet   Oral   Take 81 mg by mouth daily.         .  Calcium Carbonate-Vit D-Min (CALCIUM-VITAMIN D-MINERALS) 600-400 MG-UNIT CHEW   Oral   Chew 1 tablet by mouth daily.         . cholecalciferol (VITAMIN D) 1000 UNITS tablet   Oral   Take 1,000 Units by mouth daily.         . Domperidone BP POWD      TAKE ONE CAPSULE BY MOUTH THREE TIMES A DAY BEFORE MEALS   270 g   2   . ipratropium (ATROVENT) 0.02 % nebulizer solution   Nebulization   Take 2.5 mLs (500 mcg total) by nebulization every 6 (six) hours as needed for wheezing.   75 mL      . LORazepam (ATIVAN) 0.5 MG tablet   Oral   Take 0.5 mg by mouth as needed for anxiety.          . Magnesium 250 MG TABS   Oral   Take 1 tablet by mouth daily.         . methocarbamol (ROBAXIN) 750 MG tablet   Oral   Take 750 mg by mouth at bedtime as needed (muscle cramps).         . Multiple Vitamin (MULTIVITAMIN WITH MINERALS) TABS tablet   Oral   Take 1 tablet by mouth daily.         . pantoprazole (PROTONIX) 40 MG tablet   Oral   Take 40 mg by mouth 2 (two) times daily.          Marland Bougie Umeclidinium-Vilanterol (ANORO ELLIPTA) 62.5-25 MCG/INH AEPB   Inhalation   Inhale 1 puff into the lungs daily.   60 each   5   . vitamin C (ASCORBIC ACID) 500 MG tablet   Oral   Take 500 mg by mouth daily.         . vitamin E 400 UNIT capsule   Oral   Take 400 Units by mouth daily.          BP 131/80  Pulse 107  Temp(Src) 98.8 F (37.1 C) (Oral)  Resp 26  Ht 5\' 6"  (1.676 m)  Wt 204 lb (92.534 kg)  BMI 32.94 kg/m2  SpO2 97% Physical Exam  Constitutional: She is oriented to person, place, and time. She appears well-developed and well-nourished.  HENT:  Head: Normocephalic and atraumatic.  Eyes: Pupils are equal, round, and reactive to light.  Neck: Normal range of motion. Neck supple.  Cardiovascular: Normal rate, regular rhythm and normal heart sounds.   Pulmonary/Chest: Effort normal and breath sounds normal. No respiratory distress. She has no wheezes. She has no  rales. She exhibits no tenderness.  Diminished breath sounds bilaterally with some mild tachypnea  Abdominal: Soft. Bowel sounds are normal. There is no tenderness. There  is no rebound and no guarding.  Musculoskeletal: Normal range of motion. She exhibits no edema and no tenderness.  Lymphadenopathy:    She has no cervical adenopathy.  Neurological: She is alert and oriented to person, place, and time.  Skin: Skin is warm and dry. No rash noted.  Psychiatric: She has a normal mood and affect.    ED Course   Procedures (including critical care time)  Results for orders placed during the hospital encounter of 12/26/12  CBC WITH DIFFERENTIAL      Result Value Range   WBC 13.0 (*) 4.0 - 10.5 K/uL   RBC 4.39  3.87 - 5.11 MIL/uL   Hemoglobin 13.3  12.0 - 15.0 g/dL   HCT 16.1  09.6 - 04.5 %   MCV 91.8  78.0 - 100.0 fL   MCH 30.3  26.0 - 34.0 pg   MCHC 33.0  30.0 - 36.0 g/dL   RDW 40.9  81.1 - 91.4 %   Platelets 397  150 - 400 K/uL   Neutrophils Relative % 82 (*) 43 - 77 %   Neutro Abs 10.7 (*) 1.7 - 7.7 K/uL   Lymphocytes Relative 9 (*) 12 - 46 %   Lymphs Abs 1.2  0.7 - 4.0 K/uL   Monocytes Relative 7  3 - 12 %   Monocytes Absolute 0.9  0.1 - 1.0 K/uL   Eosinophils Relative 1  0 - 5 %   Eosinophils Absolute 0.1  0.0 - 0.7 K/uL   Basophils Relative 0  0 - 1 %   Basophils Absolute 0.0  0.0 - 0.1 K/uL  D-DIMER, QUANTITATIVE      Result Value Range   D-Dimer, Quant 0.38  0.00 - 0.48 ug/mL-FEU  POCT I-STAT, CHEM 8      Result Value Range   Sodium 132 (*) 135 - 145 mEq/L   Potassium 4.0  3.5 - 5.1 mEq/L   Chloride 96  96 - 112 mEq/L   BUN 7  6 - 23 mg/dL   Creatinine, Ser 7.82  0.50 - 1.10 mg/dL   Glucose, Bld 956 (*) 70 - 99 mg/dL   Calcium, Ion 2.13  0.86 - 1.30 mmol/L   TCO2 27  0 - 100 mmol/L   Hemoglobin 15.0  12.0 - 15.0 g/dL   HCT 57.8  46.9 - 62.9 %   Dg Chest 2 View  12/27/2012   *RADIOLOGY REPORT*  Clinical Data: Shortness of breath.  CHEST - 2 VIEW  Comparison:  Chest x-ray 12/19/2012.  Findings: Lung volumes are normal.  No consolidative airspace disease.  No pleural effusions.  No pneumothorax.  No pulmonary nodule or mass noted.  Pulmonary vasculature and the cardiomediastinal silhouette are within normal limits. Atherosclerosis of the thoracic aorta.  IMPRESSION: 1. No radiographic evidence of acute cardiopulmonary disease. 2.  Atherosclerosis.   Original Report Authenticated By: Trudie Reed, M.D.   Dg Chest 2 View  12/19/2012   *RADIOLOGY REPORT*  Clinical Data: Shortness of breath, COPD, hypertension.  CHEST - 2 VIEW  Comparison: 09/26/2007  Findings: There is hyperinflation of the lungs compatible with COPD.  Scarring in the lung bases.  Heart is normal size.  No acute opacities or effusions.  No acute bony abnormality.  IMPRESSION: COPD/chronic changes.  No acute findings.   Original Report Authenticated By: Charlett Nose, M.D.    Date: 12/27/2012  Rate: 103  Rhythm: sinus tachycardia  QRS Axis: left  Intervals: normal  ST/T Wave abnormalities: nonspecific ST/T changes  Conduction Disutrbances:none  Narrative Interpretation:   Old EKG Reviewed: none available    Dg Chest 2 View  12/27/2012   *RADIOLOGY REPORT*  Clinical Data: Shortness of breath.  CHEST - 2 VIEW  Comparison: Chest x-ray 12/19/2012.  Findings: Lung volumes are normal.  No consolidative airspace disease.  No pleural effusions.  No pneumothorax.  No pulmonary nodule or mass noted.  Pulmonary vasculature and the cardiomediastinal silhouette are within normal limits. Atherosclerosis of the thoracic aorta.  IMPRESSION: 1. No radiographic evidence of acute cardiopulmonary disease. 2.  Atherosclerosis.   Original Report Authenticated By: Trudie Reed, M.D.   1. COPD exacerbation     MDM  Pt was given neb treatments, has also just used a neb prior to arrival, given solumedrol.  Still oxygen requiring, sats dropped to mid 80s on ambulation.  No evidence of pneumonia on CXR.   D-dimer neg and no other symptoms suggestive of PE.  Will consult hospitalist for admission    Rolan Bucco, MD 12/27/12 [phone]

## 2012-12-27 NOTE — Progress Notes (Signed)
CSW received referral for COPD Gold Protocol - though patient does not meet qualifications (only 1 admission within the past 6 months). CSW signing off.   Unice Bailey, LCSW [facility] Clinical Social Worker cell #: [phone]

## 2012-12-27 NOTE — H&P (Signed)
[facility] History and Physical  KHUSHBU PIPPEN ZOX:[id number] DOB: [birth year] DOA: 12/26/2012  Referring physician: Timoteo Expose PCP: Windy Carina, PA-C  Specialists: [facility], [facility]  Chief Complaint: Dyspnea, Cough  HPI: Virginia Burns is a 67 y.o. female with medical history significant for COPD, gastroparesis, hypertension and diverticulitis who presents to the emergency department with a 3 to 4-day history of progressive shortness of breath, worse with exertion. She reports that she was seen by Dr. Lynnae January on August 4 and was placed on a prednisone taper and changed to a new COPD medication called Anoro with instruction to use her nebs at home every 6 hours and prn for wheezing and SOB. She experienced a brief improvement and then her symptoms returned and progressively got worse, her prednisone taper ended on the 10th and was followed by a rapid increase in wheezing and shortness of breath. She came to the ED for evaluation when she started to have weakness and could not ambulate to her bathroom without significant distress from dyspnea.  Review of Systems: Review of Systems  Constitutional: Positive for weight loss, malaise/fatigue and diaphoresis. Negative for fever and chills.  HENT: Positive for congestion. Negative for sore throat.   Eyes: Negative for blurred vision and double vision.  Respiratory: Positive for cough, sputum production, shortness of breath and wheezing. Negative for hemoptysis.   Cardiovascular: Positive for orthopnea. Negative for chest pain, palpitations and leg swelling.  Gastrointestinal: Positive for heartburn. Negative for nausea, vomiting, abdominal pain, diarrhea, constipation, blood in stool and melena.  Genitourinary: Negative.   Musculoskeletal: Positive for myalgias and joint pain.  Skin: Negative for itching and rash.  Neurological: Positive for dizziness, weakness and headaches.  Endo/Heme/Allergies: Negative.    Psychiatric/Behavioral: Positive for depression. Negative for suicidal ideas, hallucinations, memory loss and substance abuse. The patient is nervous/anxious and has insomnia.   All other systems reviewed and are negative.     Past Medical History  Diagnosis Date  . Personal history of colonic polyps 10/07/2009    tubular adenoma  . Vitamin B12 deficiency   . Gastroparesis   . Diverticulosis of colon (without mention of hemorrhage)   . Family history of malignant neoplasm of gastrointestinal tract   . Osteoporosis   . Hypertension   . Other esophagitis   . COPD (chronic obstructive pulmonary disease)    Past Surgical History  Procedure Laterality Date  . Cholecystectomy    . Abdominal hysterectomy     Social History:  reports that she quit smoking about 2 years ago. Her smoking use included Cigarettes. She has a 25 pack-year smoking history. She has never used smokeless tobacco. She reports that  drinks alcohol. She reports that she does not use illicit drugs. Lives with her son, independet of all ADLs. 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  Allergies  Allergen Reactions  . Clindamycin/Lincomycin Hives    Family History  Problem Relation Age of Onset  . COPD Mother   . Colon cancer Cousin   . Diabetes Brother   . Colitis Son     Prior to Admission medications   Medication Sig Start Date End Date Taking? Authorizing Provider  albuterol (PROVENTIL HFA;VENTOLIN HFA) 108 (90 BASE) MCG/ACT inhaler Inhale 2 puffs into the lungs every 4 (four) hours as needed.   Yes Historical Provider, MD  albuterol (PROVENTIL) (2.5 MG/3ML) 0.083% nebulizer solution Take 2.5 mg by nebulization every 6 (six) hours  as needed for wheezing.   Yes Historical Provider, MD  amLODipine (NORVASC) 5 MG tablet Take 7.5 mg by mouth daily.    Yes Historical Provider, MD  aspirin 81 MG tablet Take 81 mg by mouth daily.   Yes Historical Provider, MD  Calcium  Carbonate-Vit D-Min (CALCIUM-VITAMIN D-MINERALS) 600-400 MG-UNIT CHEW Chew 1 tablet by mouth daily.   Yes Historical Provider, MD  cholecalciferol (VITAMIN D) 1000 UNITS tablet Take 1,000 Units by mouth daily.   Yes Historical Provider, MD  Domperidone BP POWD TAKE ONE CAPSULE BY MOUTH THREE TIMES A DAY BEFORE MEALS 02/02/12  Yes Mardella Layman, MD  ipratropium (ATROVENT) 0.02 % nebulizer solution Take 2.5 mLs (500 mcg total) by nebulization every 6 (six) hours as needed for wheezing. 12/19/12  Yes Coralyn Helling, MD  LORazepam (ATIVAN) 0.5 MG tablet Take 0.5 mg by mouth as needed for anxiety.    Yes Historical Provider, MD  Magnesium 250 MG TABS Take 1 tablet by mouth daily.   Yes Historical Provider, MD  methocarbamol (ROBAXIN) 750 MG tablet Take 750 mg by mouth at bedtime as needed (muscle cramps).   Yes Historical Provider, MD  Multiple Vitamin (MULTIVITAMIN WITH MINERALS) TABS tablet Take 1 tablet by mouth daily.   Yes Historical Provider, MD  pantoprazole (PROTONIX) 40 MG tablet Take 40 mg by mouth 2 (two) times daily.    Yes Historical Provider, MD  Umeclidinium-Vilanterol (ANORO ELLIPTA) 62.5-25 MCG/INH AEPB Inhale 1 puff into the lungs daily. 12/19/12  Yes Coralyn Helling, MD  vitamin C (ASCORBIC ACID) 500 MG tablet Take 500 mg by mouth daily.   Yes Historical Provider, MD  vitamin E 400 UNIT capsule Take 400 Units by mouth daily.   Yes Historical Provider, MD   Physical Exam: Filed Vitals:   12/27/12 0444  BP: 177/89  Pulse: 114  Temp: 98.1 F (36.7 C)  Resp: 24     General:  No acute distress, well-developed well-nourished talkative pleasant  Eyes: Normal  ENT: Normal, no signs of thrush  Neck: No adenopathy no enlarged thyroid normal range of motion  Cardiovascular: Tachycardic, regular  Respiratory: Bilateral wheezing and decreased breath sounds in bilateral bases  Abdomen: Soft nontender  Skin: No edema, rashes or open lesions  Musculoskeletal: Normal muscle tone bulk and  strength  Psychiatric: Appropriate  Neurologic: Nonfocal findings  Labs on Admission:  Basic Metabolic Panel:  Recent Labs Lab 12/27/12 0114  NA 132*  K 4.0  CL 96  GLUCOSE 113*  BUN 7  CREATININE 0.80    Recent Labs Lab 12/27/12 0100 12/27/12 0114  WBC 13.0*  --   NEUTROABS 10.7*  --   HGB 13.3 15.0  HCT 40.3 44.0  MCV 91.8  --   PLT 397  --    CRadiological Exams on Admission: Dg Chest 2 View  12/27/2012   *RADIOLOGY REPORT*  Clinical Data: Shortness of breath.  CHEST - 2 VIEW  Comparison: Chest x-ray 12/19/2012.  Findings: Lung volumes are normal.  No consolidative airspace disease.  No pleural effusions.  No pneumothorax.  No pulmonary nodule or mass noted.  Pulmonary vasculature and the cardiomediastinal silhouette are within normal limits. Atherosclerosis of the thoracic aorta.  IMPRESSION: 1. No radiographic evidence of acute cardiopulmonary disease. 2.  Atherosclerosis.   Original Report Authenticated By: Trudie Reed, M.D.    EKG: Independently reviewed. Tele shows sinus tachycardia  Assessment/Plan  1. COPD Exacerbation:  Solu-Medrol 60 mg IV twice a day  Empiric Levaquin 500 mg  IV daily, note there is no infiltrate on her CXR  Scheduled and when necessary albuterol and Atrovent  Nasal cannula oxygen with pulse oximetry monitoring with vital signs  Will obtain ambulatory O2 sats  We'll add on a BNP to assess for heart failure  We'll check troponin X 1  Alprazolam for anxiety and air hunger prn  2.   HTN: resumed Norvasc and ASA  3. Gastroparesis: Patient take domperidone that she get from [place], will substitute Reglan for hospitalization before meals.  4. GERD: PPI for reflux treatment and prevention  COPD GOLD ORDER PROTOCOL  Code Status: Full Code, I have requested that patient be given information on advance directives. Disposition Plan: Admitted as inpatient, home when medically stable.  Time spent: 70  minutes  [facility] Pager [phone]  If 7PM-7AM, please contact night-coverage [email or web address] Password [facility] 12/27/2012, 5:47 AM

## 2012-12-27 NOTE — Progress Notes (Signed)
[facility] PROGRESS NOTE  Virginia Burns ZOX:[id number] DOB: [birth year] DOA: 12/26/2012 PCP: Windy Carina, PA-C  Brief narrative: 67 y.o. female with medical history significant for COPD, gastroparesis, hypertension and diverticulitis who presents to the emergency department with a 3 to 4-day history of progressive shortness of breath, worse with exertion. She reports that she was seen by Dr. Lynnae January on August 4 and was placed on a prednisone taper and changed to a new COPD medication called Anoro with instruction to use her nebs at home every 6 hours and prn for wheezing and SOB. She experienced a brief improvement and then her symptoms returned and progressively got worse, her prednisone taper ended on the 10th and was followed by a rapid increase in wheezing and shortness of breath. She came to the ED for evaluation when she started to have weakness and could not ambulate to her bathroom without significant distress from dyspnea.   Assessment/Plan:  Principal Problem:   Acute respiratory failure with hypoxia - secondary to acute COPD exacerbation and pneumonia - changed nebulizer management to albuterol and Atrovent scheduled every 6 hours and every 2 hours PRN - continue solumedrol 60 mg IV Q 12 hours - continue Levaquin for possible pneumonia  COPD exacerbation - COPD order set in place - management as above with nebulizer treatments, steroids and antibiotcs  Community acquired pneumonia - Levaquin daily - blood cultures not ordered at the time of admission to guide antibiotic therapy  Active Problems:   TOBACCO ABUSE - counseled oncessation   SLEEP APNEA, OBSTRUCTIVE - stable, CPAP PRN   HYPERTENSION - continue Norvasc 7.5 mg daily   EROSIVE ESOPHAGITIS - PPI twice daily  Code Status: full code Family Communication: no family at the bedside Disposition Plan: home when stable  Manson Passey, MD  [facility] Pager [phone]  If 7PM-7AM, please contact  night-coverage [email or web address] Password [facility] 12/27/2012, 6:33 AM   LOS: 1 day   Consultants:  None   Procedures:  None   Antibiotics:  Levaquin 12/27/2012 -->  HPI/Subjective: Feels better since admission.  Objective: Filed Vitals:   12/27/12 0315 12/27/12 0330 12/27/12 0345 12/27/12 0444  BP:    177/89  Pulse: 102 102 101 114  Temp:    98.1 F (36.7 C)  TempSrc:    Oral  Resp: 23 22 18 24   Height:    5\' 6"  (1.676 m)  Weight:    93.5 kg (206 lb 2.1 oz)  SpO2: 96% 96% 95% 98%   No intake or output data in the 24 hours ending 12/27/12 8295  Exam:   General:  Pt is alert, follows commands appropriately, not in acute distress  Cardiovascular: Regular rate and rhythm, S1/S2, no murmurs, no rubs, no gallops  Respiratory: crackles in upper lung lobes, minimal wheezing in upper lobes  Abdomen: Soft, non tender, non distended, bowel sounds present, no guarding  Extremities: No edema, pulses DP and PT palpable bilaterally  Neuro: Grossly nonfocal  Data Reviewed: Basic Metabolic Panel:  Recent Labs Lab 12/27/12 0114 12/27/12 0540  NA 132*  --   K 4.0  --   CL 96  --   GLUCOSE 113*  --   BUN 7  --   CREATININE 0.80 0.46*   Liver Function Tests: No results found for this basename: AST, ALT, ALKPHOS, BILITOT, PROT, ALBUMIN,  in the last 168 hours No results found for this basename: LIPASE, AMYLASE,  in the last 168 hours No results found for this basename: AMMONIA,  in the last 168 hours CBC:  Recent Labs Lab 12/27/12 0100 12/27/12 0114 12/27/12 0540  WBC 13.0*  --  10.7*  NEUTROABS 10.7*  --   --   HGB 13.3 15.0 12.7  HCT 40.3 44.0 39.2  MCV 91.8  --  92.0  PLT 397  --  357   Cardiac Enzymes:  Recent Labs Lab 12/27/12 0540  TROPONINI <0.30   BNP: No components found with this basename: POCBNP,  CBG: No results found for this basename: GLUCAP,  in the last 168 hours  No results found for this or any previous visit (from the past 240  hour(s)).   Studies: Dg Chest 2 View  12/27/2012   *RADIOLOGY REPORT*  Clinical Data: Shortness of breath.  CHEST - 2 VIEW  Comparison: Chest x-ray 12/19/2012.  Findings: Lung volumes are normal.  No consolidative airspace disease.  No pleural effusions.  No pneumothorax.  No pulmonary nodule or mass noted.  Pulmonary vasculature and the cardiomediastinal silhouette are within normal limits. Atherosclerosis of the thoracic aorta.  IMPRESSION: 1. No radiographic evidence of acute cardiopulmonary disease. 2.  Atherosclerosis.   Original Report Authenticated By: Trudie Reed, M.D.    Scheduled Meds: . sodium chloride   Intravenous STAT  . albuterol  2.5 mg Nebulization Q6H  . amLODipine  7.5 mg Oral Daily  . aspirin EC  81 mg Oral Daily  . calcium-vitamin D  1 tablet Oral Daily  . cholecalciferol  1,000 Units Oral Daily  . enoxaparin (LOVENOX) injection  40 mg Subcutaneous Q24H  . guaiFENesin  600 mg Oral BID  . ipratropium  0.5 mg Nebulization Q6H  . levofloxacin (LEVAQUIN) IV  500 mg Intravenous Daily  . magnesium oxide  200 mg Oral Daily  . methylPREDNISolone (SOLU-MEDROL) injection  60 mg Intravenous Q12H  . metoCLOPramide  5 mg Oral TID AC & HS  . multivitamin with minerals  1 tablet Oral Daily  . pantoprazole  40 mg Oral BID  . senna  1 tablet Oral BID  . sodium chloride  3 mL Intravenous Q12H  . vitamin C  500 mg Oral Daily  . vitamin E  400 Units Oral Daily   Continuous Infusions: . sodium chloride 50 mL/hr at 12/27/12 0455

## 2012-12-27 NOTE — Care Management Note (Signed)
   CARE MANAGEMENT NOTE 12/27/2012  Patient:  Virginia Burns, Virginia Burns   Account Number:  [id number]  Date Initiated:  12/27/2012  Documentation initiated by:  Ramiya Delahunty  Subjective/Objective Assessment:   67 yo female admitted with COPD.     Action/Plan:   Home when stable   Anticipated DC Date:     Anticipated DC Plan:  HOME/SELF CARE      DC Planning Services  CM consult      Choice offered to / List presented to:  NA   DME arranged  NA      DME agency  NA     HH arranged  NA      HH agency  NA   Status of service:  In process, will continue to follow Medicare Important Message given?   (If response is "NO", the following Medicare IM given date fields will be blank) Date Medicare IM given:   Date Additional Medicare IM given:    Discharge Disposition:    Per UR Regulation:  Reviewed for med. necessity/level of care/duration of stay  If discussed at Long Length of Stay Meetings, dates discussed:    Comments:  12/27/12 1219 Roxy Manns Iram Astorino,MSN,RN [phone] PCP: Windy Carina, PA-C Specialists: [facility], [facility]. No Pt follow up recommended. Will assess for needs per COPD GOLD protocol.

## 2012-12-27 NOTE — Evaluation (Signed)
Occupational Therapy Evaluation Patient Details Name: Virginia Burns MRN: [id number] DOB: 23-Aug-1945 Today's Date: 12/27/2012 Time: 1135-1200 OT Time Calculation (min): 25 min  OT Assessment / Plan / Recommendation History of present illness 67 y.o. female with medical history significant for COPD, gastroparesis, hypertension and diverticulitis who presents to the emergency department with a 3 to 4-day history of progressive shortness of breath, worse with exertion.    Clinical Impression   Pt presents with deficits in ADL's and self care due to COPD exacerbation, SOB. She will benefit from acute OT for assist with increased endurance, activity tolerance & anticipated Mod I level for ADL's at d/c.    OT Assessment  Patient needs continued OT Services    Follow Up Recommendations  No OT follow up    Barriers to Discharge      Equipment Recommendations  None recommended by OT    Recommendations for Other Services    Frequency  Min 2X/week    Precautions / Restrictions Precautions Precautions: None Restrictions Weight Bearing Restrictions: No   Pertinent Vitals/Pain No, denies pain.    ADL  Eating/Feeding: Performed;Independent Where Assessed - Eating/Feeding: Bed level Grooming: Performed;Wash/dry hands;Supervision/safety (Standing at sink in bathroom) Where Assessed - Grooming: Unsupported standing Upper Body Bathing: Simulated;Modified independent (Simulated sitting EOB) Where Assessed - Upper Body Bathing: Unsupported sitting Lower Body Bathing: Simulated;Supervision/safety Where Assessed - Lower Body Bathing: Unsupported sit to stand Upper Body Dressing: Simulated;Modified independent Where Assessed - Upper Body Dressing: Unsupported sitting (EOB) Lower Body Dressing: Performed;Supervision/safety;Min guard (Doff/Don socks sitting EOB & underwear standing) Where Assessed - Lower Body Dressing: Unsupported sitting;Unsupported sit to stand Toilet Transfer:  Performed;Min guard Statistician Method: Sit to Barista: Raised toilet seat with arms (or 3-in-1 over toilet) Toileting - Clothing Manipulation and Hygiene: Performed;Supervision/safety;Min guard Where Assessed - Engineer, mining and Hygiene: Standing Tub/Shower Transfer Method: Not assessed Equipment Used: Gait belt Transfers/Ambulation Related to ADLs: Pt overall Mod I sit-stand transfers & supervision/min guard functional mobility and transfers into bathroom/on/off commode etc. Became SOB & required rest break x1 ADL Comments: Pt min guard assist toilet transfer w/ SOB noted. She presents with deficits in ADL's and self care due to COPD exacerbation, SOB. She will benefit from acute OT for assist with increased endurance, activity tolerance & anticipated Mod I level for ADL's at d/c.    OT Diagnosis: Generalized weakness  OT Problem List: Decreased activity tolerance;Decreased knowledge of use of DME or AE;Cardiopulmonary status limiting activity OT Treatment Interventions: Self-care/ADL training;Energy conservation;DME and/or AE instruction;Patient/family education;Therapeutic activities   OT Goals(Current goals can be found in the care plan section) Acute Rehab OT Goals Patient Stated Goal: Breathe better & get back to work. OT Goal Formulation: With patient Time For Goal Achievement: 01/03/13 Potential to Achieve Goals: Good ADL Goals Pt Will Perform Grooming: with modified independence;sitting;standing Pt Will Perform Lower Body Dressing: with modified independence;sit to/from stand;with adaptive equipment Pt Will Transfer to Toilet: with modified independence;ambulating;bedside commode Pt Will Perform Toileting - Clothing Manipulation and hygiene: with modified independence;sit to/from stand Pt Will Perform Tub/Shower Transfer: Tub transfer;with modified independence;ambulating;shower seat  Visit Information  Last OT Received On:  12/27/12 Assistance Needed: +1 History of Present Illness: 67 y.o. female with medical history significant for COPD, gastroparesis, hypertension and diverticulitis who presents to the emergency department with a 3 to 4-day history of progressive shortness of breath, worse with exertion.        Prior Functioning     Home  Living Family/patient expects to be discharged to:: Private residence Living Arrangements: Children Available Help at Discharge: Family Type of Home: House Home Access: Stairs to enter Secretary/administrator of Steps: 3 Entrance Stairs-Rails: Right Home Layout: One level Home Equipment: None Prior Function Level of Independence: Independent Communication Communication: No difficulties Dominant Hand: Right    Vision/Perception Vision - History Baseline Vision: Wears glasses all the time Visual History: Cataracts Patient Visual Report: No change from baseline   Cognition  Cognition Arousal/Alertness: Awake/alert Behavior During Therapy: WFL for tasks assessed/performed Overall Cognitive Status: Within Functional Limits for tasks assessed    Extremity/Trunk Assessment Upper Extremity Assessment Upper Extremity Assessment: Overall WFL for tasks assessed Lower Extremity Assessment Lower Extremity Assessment: Overall WFL for tasks assessed Cervical / Trunk Assessment Cervical / Trunk Assessment: Normal    Mobility Bed Mobility Bed Mobility: Sit to Supine;Scooting to HOB Sit to Supine: 6: Modified independent (Device/Increase time);HOB flat;With rail Scooting to [facility]: 6: Modified independent (Device/Increase time) Transfers Transfers: Sit to Stand;Stand to Sit Sit to Stand: From chair/3-in-1;6: Modified independent (Device/Increase time) Stand to Sit: To chair/3-in-1;6: Modified independent (Device/Increase time);To bed       Balance Balance Balance Assessed: Yes Static Sitting Balance Static Sitting - Balance Support: No upper extremity supported;Feet  supported Static Standing Balance Static Standing - Balance Support: No upper extremity supported;During functional activity   End of Session OT - End of Session Equipment Utilized During Treatment: Gait belt Activity Tolerance: Patient tolerated treatment well Patient left: in bed;with call bell/phone within reach;with family/visitor present  GO     Roselie Awkward Dixon 12/27/2012, 12:31 PM

## 2012-12-27 NOTE — Progress Notes (Signed)
INITIAL NUTRITION ASSESSMENT  DOCUMENTATION CODES Per approved criteria  -Obesity Unspecified   INTERVENTION: Encourage PO intake >75% of 3 meals daily; healthy foods RD to monitor PO intake  NUTRITION DIAGNOSIS: Inadequate oral intake related to breathing difficulty food preferences as evidenced by pt's report eating 50% of meals.   Goal: Pt to meet >/= 90% of their estimated nutrition needs   Monitor:  PO intake Weight Labs  Reason for Assessment: Consult  67 y.o. female  Admitting Dx: Acute respiratory failure with hypoxia  ASSESSMENT: 67 y.o. female with medical history significant for COPD, gastroparesis, hypertension and diverticulitis who presents to the emergency department with a 3 to 4-day history of progressive shortness of breath, worse with exertion. She came to the ED for evaluation when she started to have weakness and could not ambulate to her bathroom without significant distress from dyspnea. Pt states she has been eating less for the past few days due to breathing difficulty but, before then she was eating well with 3 meals daily. Pt states she eats protein-rich foods and vegetables daily but, doesn't eat much fruit. Encouraged increased intake of fruit and to continue daily multivitamin. Pt states she only ate 50% of breakfast today due to not liking the food much. Encouraged pt to eat >75% of 3 meals daily to keep nutrition status up. Pt is not interested in any supplements at this time. Pt denies wt loss stating she usually weighs around 206 lbs.   Height: Ht Readings from Last 1 Encounters:  12/27/12 5\' 6"  (1.676 m)    Weight: Wt Readings from Last 1 Encounters:  12/27/12 206 lb 2.1 oz (93.5 kg)    Ideal Body Weight: 130 lbs  % Ideal Body Weight: 158%  Wt Readings from Last 10 Encounters:  12/27/12 206 lb 2.1 oz (93.5 kg)  12/19/12 212 lb (96.163 kg)  09/29/11 200 lb (90.719 kg)  06/06/10 196 lb 8 oz (89.132 kg)  01/21/10 201 lb (91.173 kg)   01/24/09 177 lb 8 oz (80.513 kg)  09/25/08 172 lb (78.019 kg)  11/24/07 151 lb 8 oz (68.72 kg)  10/19/07 148 lb (67.132 kg)  10/14/07 146 lb 6.1 oz (66.398 kg)    Usual Body Weight: 206 lbs  % Usual Body Weight: 100%  BMI:  Body mass index is 33.29 kg/(m^2).  Estimated Nutritional Needs: Kcal: 1850-2000 Protein: 94-112 grams Fluid: 2.6 L  Skin: WDL  Diet Order: Cardiac  EDUCATION NEEDS: -No education needs identified at this time   Intake/Output Summary (Last 24 hours) at 12/27/12 1404 Last data filed at 12/27/12 0559  Gross per 24 hour  Intake    100 ml  Output      0 ml  Net    100 ml    Last BM: 8/10  Labs:   Recent Labs Lab 12/27/12 0114 12/27/12 0540  NA 132*  --   K 4.0  --   CL 96  --   BUN 7  --   CREATININE 0.80 0.46*  GLUCOSE 113*  --     CBG (last 3)  No results found for this basename: GLUCAP,  in the last 72 hours  Scheduled Meds: . sodium chloride   Intravenous STAT  . albuterol  2.5 mg Nebulization Q6H WA  . amLODipine  7.5 mg Oral Daily  . aspirin EC  81 mg Oral Daily  . calcium-vitamin D  1 tablet Oral Daily  . cholecalciferol  1,000 Units Oral Daily  . enoxaparin (LOVENOX)  injection  40 mg Subcutaneous Q24H  . guaiFENesin  600 mg Oral BID  . ipratropium  0.5 mg Nebulization Q6H WA  . levofloxacin (LEVAQUIN) IV  500 mg Intravenous Daily  . magnesium oxide  200 mg Oral Daily  . methylPREDNISolone (SOLU-MEDROL) injection  60 mg Intravenous Q12H  . multivitamin with minerals  1 tablet Oral Daily  . pantoprazole  40 mg Oral BID  . senna  1 tablet Oral BID  . sodium chloride  3 mL Intravenous Q12H  . vitamin C  500 mg Oral Daily  . vitamin E  400 Units Oral Daily    Continuous Infusions: . sodium chloride 50 mL/hr at 12/27/12 0455    Past Medical History  Diagnosis Date  . Personal history of colonic polyps 10/07/2009    tubular adenoma  . Vitamin B12 deficiency   . Gastroparesis   . Diverticulosis of colon (without  mention of hemorrhage)   . Family history of malignant neoplasm of gastrointestinal tract   . Osteoporosis   . Hypertension   . Other esophagitis   . COPD (chronic obstructive pulmonary disease)     Past Surgical History  Procedure Laterality Date  . Cholecystectomy    . Abdominal hysterectomy      Ian Malkin RD, LDN Inpatient Clinical Dietitian Pager: [phone] After Hours Pager: [phone]

## 2012-12-28 MED ORDER — NICOTINE POLACRILEX 2 MG MT LOZG
2.0000 mg | LOZENGE | OROMUCOSAL | Status: DC | PRN
  Administered 2012-12-28 – 2012-12-31 (×4): 2 mg via ORAL

## 2012-12-28 MED ORDER — METHYLPREDNISOLONE SODIUM SUCC 125 MG IJ SOLR
60.0000 mg | Freq: Four times a day (QID) | INTRAMUSCULAR | Status: DC
  Administered 2012-12-28 – 2012-12-30 (×8): 60 mg via INTRAVENOUS
  Filled 2012-12-28 (×13): qty 0.96

## 2012-12-28 NOTE — Progress Notes (Signed)
Patient ID: Virginia Burns, female   DOB: 12/30/1945, 68 y.o.   MRN: [id number] [facility] PROGRESS NOTE  Virginia Burns WUJ:[id number] DOB: 08-09-1945 DOA: 12/26/2012 PCP: Windy Carina, PA-C  Brief narrative: Pt is 67 y.o. female with medical history significant for COPD, gastroparesis, hypertension and diverticulitis who presented to the emergency department with a 3 - 4-day history of progressive shortness of breath, worse with exertion. She reports that she was seen by Dr. Lynnae January on August 4 and was placed on a prednisone taper and changed to a new COPD medication called Anoro with instruction to use her nebs at home every 6 hours and prn for wheezing and SOB. She experienced a brief improvement and then her symptoms returned and progressively got worse, her prednisone taper ended on the 10th and was followed by a rapid increase in wheezing and shortness of breath. She came to the ED for evaluation when she started to have weakness and could not ambulate to her bathroom without significant distress from dyspnea.  Principal Problem:   Acute respiratory failure with hypoxia - secondary to acute COPD exacerbation - pt clinically improving but still with expiratory wheezing prior to administration of nebulizers  - I will increase the  Frequency of solumedrol from 60 mg IV BID to QID for today with planned taper as clinically indicated - continue Levaquin day #2/7 - agree with continuation of nebulizers scheduled and as needed Active Problems:   TOBACCO ABUSE - pt reports she quit smoking 2 years ago and is proud she has not had any cigarette since that time    HYPERTENSION - reasonable inpatient control - continue home medical regimen    EROSIVE ESOPHAGITIS - stable clinically, continue Protonix   COPD exacerbation - management as noted above  Consultants:  None  Procedures/Studies: Dg Chest 2 View  12/27/2012   No radiographic evidence of acute cardiopulmonary disease.   Atherosclerosis.     Antibiotics:  Levaquin 8/12 -->   Code Status: Full Family Communication: Pt at bedside Disposition Plan: Home when medically stable  HPI/Subjective: No events overnight.   Objective: Filed Vitals:   12/27/12 0800 12/27/12 1450 12/27/12 2114 12/28/12 0454  BP: 138/70 139/81 140/70 114/61  Pulse:  109 115 83  Temp:  97.9 F (36.6 C) 98.1 F (36.7 C) 97.7 F (36.5 C)  TempSrc:  Oral Oral Oral  Resp:  20 20 18   Height:      Weight:      SpO2:  99% 98% 97%    Intake/Output Summary (Last 24 hours) at 12/28/12 0641 Last data filed at 12/28/12 0600  Gross per 24 hour  Intake 1974.17 ml  Output   1300 ml  Net 674.17 ml    Exam:   General:  Pt is alert, follows commands appropriately, not in acute distress  Cardiovascular: Regular rate and rhythm, S1/S2, no murmurs, no rubs, no gallops  Respiratory: Course breath sounds bilaterally, mild expiratory wheezing, no tachypnea   Abdomen: Soft, non tender, non distended, bowel sounds present, no guarding  Extremities: trace bilateral pitting LE edema, pulses DP and PT palpable bilaterally  Neuro: Grossly nonfocal  Data Reviewed: Basic Metabolic Panel:  Recent Labs Lab 12/27/12 0114 12/27/12 0540  NA 132*  --   K 4.0  --   CL 96  --   GLUCOSE 113*  --   BUN 7  --   CREATININE 0.80 0.46*   CBC:  Recent Labs Lab 12/27/12 0100 12/27/12 0114 12/27/12 0540  WBC 13.0*  --  10.7*  NEUTROABS 10.7*  --   --   HGB 13.3 15.0 12.7  HCT 40.3 44.0 39.2  MCV 91.8  --  92.0  PLT 397  --  357   Cardiac Enzymes:  Recent Labs Lab 12/27/12 0540 12/27/12 1200 12/27/12 1734  TROPONINI <0.30 <0.30 <0.30    Scheduled Meds: . albuterol  2.5 mg Nebulization Q6H WA  . amLODipine  7.5 mg Oral Daily  . aspirin EC  81 mg Oral Daily  . calcium-vitamin D  1 tablet Oral Daily  . cholecalciferol  1,000 Units Oral Daily  . enoxaparin  injection  40 mg Subcutaneous Q24H  . guaiFENesin  600 mg Oral BID   . ipratropium  0.5 mg Nebulization Q6H WA  . levofloxacin  IV  500 mg Intravenous Daily  . magnesium oxide  200 mg Oral Daily  . SOLU-MEDROL injection  60 mg Intravenous Q12H  . multivitamin   1 tablet Oral Daily  . pantoprazole  40 mg Oral BID  . senna  1 tablet Oral BID  . vitamin C  500 mg Oral Daily  . vitamin E  400 Units Oral Daily   Continuous Infusions: . sodium chloride 50 mL/hr at 12/28/12 0130   Debbora Presto, MD  [facility] Pager [phone]  If 7PM-7AM, please contact night-coverage [email or web address] Password [facility] 12/28/2012, 6:41 AM   LOS: 2 days

## 2012-12-29 ENCOUNTER — Ambulatory Visit: Payer: 59 | Admitting: Adult Health

## 2012-12-29 LAB — CBC
HCT: 37.1 % (ref 36.0–46.0)
MCHC: 32.1 g/dL (ref 30.0–36.0)
Platelets: 340 10*3/uL (ref 150–400)
RDW: 13.5 % (ref 11.5–15.5)
WBC: 14.1 10*3/uL — ABNORMAL HIGH (ref 4.0–10.5)

## 2012-12-29 LAB — BASIC METABOLIC PANEL
Chloride: 97 mEq/L (ref 96–112)
GFR calc Af Amer: >90 mL/min (ref >90)
GFR calc non Af Amer: >90 mL/min (ref >90)
Potassium: 3.7 mEq/L (ref 3.5–5.1)
Sodium: 134 mEq/L — ABNORMAL LOW (ref 135–145)

## 2012-12-29 MED ORDER — LEVOFLOXACIN 500 MG PO TABS
500.0000 mg | ORAL_TABLET | Freq: Every day | ORAL | Status: DC
  Administered 2012-12-29 – 2012-12-31 (×3): 500 mg via ORAL
  Filled 2012-12-29 (×3): qty 1

## 2012-12-29 NOTE — Progress Notes (Signed)
Occupational Therapy Treatment Patient Details Name: Virginia Burns MRN: [id number] DOB: [birth year] Today's Date: 12/29/2012 Time: 4098-1191 OT Time Calculation (min): 30 min  OT Assessment / Plan / Recommendation  History of present illness 67 y.o. female with medical history significant for COPD, gastroparesis, hypertension and diverticulitis who presents to the emergency department with a 3 to 4-day history of progressive shortness of breath, worse with exertion.    OT comments  Pt performed ADL's & functional mobility. She was also educated in energy conservation tech's & pursed lip breathing during ADL's & was given verbal/written instructions. Pt cont w/ SOB & wheezing however O2 SATs remained 95-97% on 2L O2 throughout session.   Follow Up Recommendations       Barriers to Discharge       Equipment Recommendations       Recommendations for Other Services    Frequency     Progress towards OT Goals Progress towards OT goals: Progressing toward goals  Plan      Precautions / Restrictions Precautions Precautions: None Precaution Comments: Pt on 2L O2 via nasal cannula  Restrictions Weight Bearing Restrictions: No   Pertinent Vitals/Pain Denies pain    ADL  Eating/Feeding: Performed;Modified independent Where Assessed - Eating/Feeding: Chair Grooming: Performed;Wash/dry hands;Wash/dry face Where Assessed - Grooming: Supported standing (Leaning forward on sink secondary to SOB/wheezing) Lower Body Dressing: Performed;Supervision/safety;Modified independent (Don underwear/socks) Where Assessed - Lower Body Dressing: Unsupported sit to stand Toilet Transfer: Performed;Modified independent Toilet Transfer Method: Sit to Barista: Materials engineer and Hygiene: Performed;Modified independent Where Assessed - Toileting Clothing Manipulation and Hygiene: Sit to stand from 3-in-1 or toilet;Standing Tub/Shower Transfer  Method: Not assessed Transfers/Ambulation Related to ADLs: Pt cont w/ Mod I sit-stand transfers and supervision-min guard for functional mobility in room/halll for therapeutic activity. Pt required x2 rest breaks. ADL Comments: Pt cont to demonstrate decreased endurance w/ ADL's & functional mobility andtransfers. She benefits from rest breaks & was educated in pursed lip breathing as well as energy conservation tech's (handout reviewed and issued to pt). O2 SATs  were 95-97% on 2L O2 throughout session.    OT Diagnosis:    OT Problem List:   OT Treatment Interventions:     OT Goals(current goals can now be found in the care plan section) Acute Rehab OT Goals Patient Stated Goal: Breathe better & get back to work.  Visit Information  Last OT Received On: 12/29/12 Assistance Needed: +1 History of Present Illness: 67 y.o. female with medical history significant for COPD, gastroparesis, hypertension and diverticulitis who presents to the emergency department with a 3 to 4-day history of progressive shortness of breath, worse with exertion.     Subjective Data      Prior Functioning       Cognition  Cognition Arousal/Alertness: Awake/alert Behavior During Therapy: WFL for tasks assessed/performed Overall Cognitive Status: Within Functional Limits for tasks assessed    Mobility  Bed Mobility Bed Mobility: Supine to Sit;Sitting - Scoot to Edge of Bed Supine to Sit: 6: Modified independent (Device/Increase time);HOB flat Sitting - Scoot to Edge of Bed: 6: Modified independent (Device/Increase time) Details for Bed Mobility Assistance: Increased time for tasks Transfers Transfers: Sit to Stand;Stand to Sit Sit to Stand: From chair/3-in-1;6: Modified independent (Device/Increase time);From bed;Without upper extremity assist Stand to Sit: To chair/3-in-1;6: Modified independent (Device/Increase time);To bed Details for Transfer Assistance: Pt required vc's for breathing techniques and  rest breaks secondary to SOB  Exercises      Balance Balance Balance Assessed: Yes Static Sitting Balance Static Sitting - Balance Support: No upper extremity supported;Feet supported Static Standing Balance Static Standing - Balance Support: No upper extremity supported;During functional activity   End of Session OT - End of Session Equipment Utilized During Treatment: Gait belt;Other (comment) Activity Tolerance: Patient tolerated treatment well Patient left: in chair;with call bell/phone within reach;with family/visitor present Nurse Communication: Mobility status  GO     [name] 12/29/2012, 12:26 PM

## 2012-12-29 NOTE — Progress Notes (Signed)
Physical Therapy Treatment Patient Details Name: CLAUDEAN LEAVELLE MRN: [id number] DOB: 04-Oct-1945 Today's Date: 12/29/2012 Time: 4782-9562 PT Time Calculation (min): 24 min  PT Assessment / Plan / Recommendation  History of Present Illness 67 y.o. female with medical history significant for COPD, gastroparesis, hypertension and diverticulitis who presents to the emergency department with a 3 to 4-day history of progressive shortness of breath, worse with exertion.    PT Comments   Pt tolerated ambulation x 150' on 2 l. Sats remained > 94% on 2  L x 75' and was 94%off of  O2 x 75'. Pt 's dyspnea 3/4 . Pt practiced pursed lip breathing.  Follow Up Recommendations  No PT follow up     Does the patient have the potential to tolerate intense rehabilitation     Barriers to Discharge        Equipment Recommendations  None recommended by PT    Recommendations for Other Services    Frequency Min 3X/week   Progress towards PT Goals Progress towards PT goals: Progressing toward goals  Plan Current plan remains appropriate    Precautions / Restrictions Precautions Precautions: None Precaution Comments: Pt on 2L O2 via nasal cannula  Restrictions Weight Bearing Restrictions: No   Pertinent Vitals/Pain See  PT comments    Mobility  Bed Mobility Bed Mobility: Supine to Sit;Sitting - Scoot to Edge of Bed Supine to Sit: 6: Modified independent (Device/Increase time);HOB flat Sitting - Scoot to Edge of Bed: 6: Modified independent (Device/Increase time) Details for Bed Mobility Assistance: Increased time for tasks Transfers Sit to Stand: From chair/3-in-1;6: Modified independent (Device/Increase time);From bed;Without upper extremity assist Stand to Sit: To chair/3-in-1;6: Modified independent (Device/Increase time);To bed Details for Transfer Assistance: Pt required vc's for breathing techniques and rest breaks secondary to SOB Ambulation/Gait Ambulation/Gait Assistance: 4: Min  guard Ambulation Distance (Feet): 150 Feet Assistive device: None Ambulation/Gait Assistance Details: pt at times held onto rail, stopped x 3 for rest standing and pursed lip breaths. Gait Pattern: Step-through pattern;Decreased stride length Gait velocity: slowly    Exercises     PT Diagnosis:    PT Problem List:   PT Treatment Interventions:     PT Goals (current goals can now be found in the care plan section) Acute Rehab PT Goals Patient Stated Goal: Breathe better & get back to work.  Visit Information  Last PT Received On: 12/29/12 Assistance Needed: +1 History of Present Illness: 67 y.o. female with medical history significant for COPD, gastroparesis, hypertension and diverticulitis who presents to the emergency department with a 3 to 4-day history of progressive shortness of breath, worse with exertion.     Subjective Data  Patient Stated Goal: Breathe better & get back to work.   Cognition  Cognition Arousal/Alertness: Awake/alert Behavior During Therapy: WFL for tasks assessed/performed Overall Cognitive Status: Within Functional Limits for tasks assessed    Balance  Balance Balance Assessed: Yes Static Sitting Balance Static Sitting - Balance Support: No upper extremity supported;Feet supported Static Standing Balance Static Standing - Balance Support: No upper extremity supported;During functional activity  End of Session PT - End of Session Equipment Utilized During Treatment: Gait belt Activity Tolerance: Patient limited by fatigue Patient left: in chair;with call bell/phone within reach;with family/visitor present   GP     Rada Hay 12/29/2012, 3:05 PM 12/29/2012  Blanchard Kelch PT [phone]

## 2012-12-29 NOTE — Progress Notes (Signed)
Patient ID: Virginia Burns, female   DOB: 22-Sep-1945, 67 y.o.   MRN: [id number] [facility] PROGRESS NOTE  Virginia Burns [id number] DOB: [birth year] DOA: 12/26/2012 PCP: Virginia Carina, PA-C  Brief narrative: Pt is 67 y.o. female with medical history significant for COPD, gastroparesis, hypertension and diverticulitis who presented to the emergency department with a 3 - 4-day history of progressive shortness of breath, worse with exertion. She reports that she was seen by Dr. Lynnae January on August 4 and was placed on a prednisone taper and changed to a new COPD medication called Anoro with instruction to use her nebs at home every 6 hours and prn for wheezing and SOB. She experienced a brief improvement and then her symptoms returned and progressively got worse, her prednisone taper ended on the 10th and was followed by a rapid increase in wheezing and shortness of breath. She came to the ED for evaluation when she started to have weakness and could not ambulate to her bathroom without significant distress from dyspnea.  Principal Problem:   Acute respiratory failure with hypoxia - secondary to acute COPD exacerbation - slow improvement, continue solumedrol 60mg  Q6 - continue nebulizers scheduled and PRN - PO levaquin    TOBACCO ABUSE - counseled    HYPERTENSION - continue amlodipine     EROSIVE ESOPHAGITIS - stable clinically, continue Protonix    COPD exacerbation - management as noted above  DVT proph: lovenox  Consultants:  None  Procedures/Studies: Dg Chest 2 View  12/27/2012   No radiographic evidence of acute cardiopulmonary disease.  Atherosclerosis.     Antibiotics:  Levaquin 8/12 -->   Code Status: Full Family Communication: Pt at bedside Disposition Plan: Home when medically stable  HPI/Subjective: No events overnight.   Objective: Filed Vitals:   12/29/12 0604 12/29/12 0928 12/29/12 1406 12/29/12 1435  BP: 141/82  136/71   Pulse: 95  98   Temp:  97.5 F (36.4 C)  98 F (36.7 C)   TempSrc: Oral  Oral   Resp: 18  18   Height:      Weight:      SpO2: 99% 99% 100% 96%    Intake/Output Summary (Last 24 hours) at 12/29/12 1531 Last data filed at 12/29/12 1033  Gross per 24 hour  Intake    243 ml  Output   2350 ml  Net  -2107 ml    Exam:   General:  Pt is alert, follows commands appropriately, not in acute distress  Cardiovascular: Regular rate and rhythm, S1/S2, no murmurs, no rubs, no gallops Respiratory:  mild expiratory wheezing, poor air movement B/L   Abdomen: Soft, non tender, non distended, bowel sounds present, no guarding  Extremities: trace LE edema, pulses DP and PT palpable bilaterally  Neuro: Grossly nonfocal  Data Reviewed: Basic Metabolic Panel:  Recent Labs Lab 12/27/12 0114 12/27/12 0540 12/29/12 0545  NA 132*  --  134*  K 4.0  --  3.7  CL 96  --  97  CO2  --   --  27  GLUCOSE 113*  --  126*  BUN 7  --  12  CREATININE 0.80 0.46* 0.51  CALCIUM  --   --  9.6   CBC:  Recent Labs Lab 12/27/12 0100 12/27/12 0114 12/27/12 0540 12/29/12 0545  WBC 13.0*  --  10.7* 14.1*  NEUTROABS 10.7*  --   --   --   HGB 13.3 15.0 12.7 11.9*  HCT 40.3 44.0 39.2 37.1  MCV 91.8  --  92.0 92.8  PLT 397  --  357 340   Cardiac Enzymes:  Recent Labs Lab 12/27/12 0540 12/27/12 1200 12/27/12 1734  TROPONINI <0.30 <0.30 <0.30    Scheduled Meds: . albuterol  2.5 mg Nebulization Q6H WA  . amLODipine  7.5 mg Oral Daily  . aspirin EC  81 mg Oral Daily  . calcium-vitamin D  1 tablet Oral Daily  . cholecalciferol  1,000 Units Oral Daily  . enoxaparin  injection  40 mg Subcutaneous Q24H  . guaiFENesin  600 mg Oral BID  . ipratropium  0.5 mg Nebulization Q6H WA  . levofloxacin  IV  500 mg Intravenous Daily  . magnesium oxide  200 mg Oral Daily  . SOLU-MEDROL injection  60 mg Intravenous Q12H  . multivitamin   1 tablet Oral Daily  . pantoprazole  40 mg Oral BID  . senna  1 tablet Oral BID  . vitamin  C  500 mg Oral Daily  . vitamin E  400 Units Oral Daily   Continuous Infusions:   Zannie Cove, MD  [facility] Pager [phone]  If 7PM-7AM, please contact night-coverage [email or web address] Password [email or web address] 12/29/2012, 3:31 PM   LOS: 3 days

## 2012-12-29 NOTE — Progress Notes (Signed)
Spoke with Ms Frasco at bedside to explain [facility] Care Management services. She goes to Dr Craige Cotta at [facility] and PCP is Elpidio Anis- PA at [facility]. Her son lives with her and she plans to return home upon discharge. Explained that [facility] Care Management will follow up with her post hospital discharge telephonically. Confirmed best contact information. Consents were signed.  Raiford Noble, MSN-Ed, RN,BSN- [facility] [phone]

## 2012-12-30 MED ORDER — BENZONATATE 100 MG PO CAPS
200.0000 mg | ORAL_CAPSULE | Freq: Two times a day (BID) | ORAL | Status: DC | PRN
  Administered 2012-12-30 – 2012-12-31 (×2): 200 mg via ORAL
  Filled 2012-12-30 (×2): qty 2

## 2012-12-30 MED ORDER — METHYLPREDNISOLONE SODIUM SUCC 40 MG IJ SOLR
40.0000 mg | Freq: Three times a day (TID) | INTRAMUSCULAR | Status: DC
  Administered 2012-12-30 – 2012-12-31 (×3): 40 mg via INTRAVENOUS
  Filled 2012-12-30 (×6): qty 1

## 2012-12-30 MED ORDER — POLYETHYLENE GLYCOL 3350 17 G PO PACK
17.0000 g | PACK | Freq: Two times a day (BID) | ORAL | Status: DC
  Administered 2012-12-30 – 2012-12-31 (×3): 17 g via ORAL
  Filled 2012-12-30 (×4): qty 1

## 2012-12-30 NOTE — Progress Notes (Signed)
Patient ID: Virginia Burns, female   DOB: [birth year], 67 y.o.   MRN: [id number] [facility] PROGRESS NOTE  BRAYDEN BETTERS [id number] DOB: Nov 25, 1945 DOA: 12/26/2012 PCP: Windy Carina, PA-C  Brief narrative: Pt is 67 y.o. female with medical history significant for COPD, gastroparesis, hypertension and diverticulitis who presented to the emergency department with a 3 - 4-day history of progressive shortness of breath, worse with exertion. She reports that she was seen by Dr. Lynnae January on August 4 and was placed on a prednisone taper and changed to a new COPD medication called Anoro with instruction to use her nebs at home every 6 hours and prn for wheezing and SOB. She experienced a brief improvement and then her symptoms returned and progressively got worse, her prednisone taper ended on the 10th and was followed by a rapid increase in wheezing and shortness of breath. She came to the ED for evaluation when she started to have weakness and could not ambulate to her bathroom without significant distress from dyspnea.  Principal Problem:   Acute respiratory failure with hypoxia - secondary to acute COPD exacerbation - slow improvement, continue solumedrol, cut down to 40mg  Q6 - continue nebulizers scheduled and PRN - PO levaquin for 5 days total    TOBACCO ABUSE - counseled    HYPERTENSION - continue amlodipine     EROSIVE ESOPHAGITIS - stable clinically, continue Protonix    COPD exacerbation - management as noted above  DVT proph: lovenox  Consultants:  None  Procedures/Studies: Dg Chest 2 View  12/27/2012   No radiographic evidence of acute cardiopulmonary disease.  Atherosclerosis.     Antibiotics:  Levaquin 8/12 -->   Code Status: Full Family Communication: Pt at bedside Disposition Plan: Home when medically stable  HPI/Subjective: No events overnight.   Objective: Filed Vitals:   12/29/12 2130 12/30/12 0204 12/30/12 0603 12/30/12 1300  BP: 130/74   133/73 153/74  Pulse: 98  89 94  Temp: 98.1 F (36.7 C)  97.7 F (36.5 C) 98.4 F (36.9 C)  TempSrc: Oral  Oral Oral  Resp: 18  18 20   Height:      Weight:      SpO2: 99% 98% 100% 97%    Intake/Output Summary (Last 24 hours) at 12/30/12 1426 Last data filed at 12/30/12 0900  Gross per 24 hour  Intake    243 ml  Output   1250 ml  Net  -1007 ml    Exam:   General:  Pt is alert, follows commands appropriately, not in acute distress  Cardiovascular: Regular rate and rhythm, S1/S2, no murmurs, no rubs, no gallops Respiratory:  mild expiratory wheezing, poor air movement B/L   Abdomen: Soft, non tender, non distended, bowel sounds present, no guarding  Extremities: trace LE edema, pulses DP and PT palpable bilaterally  Neuro: Grossly nonfocal  Data Reviewed: Basic Metabolic Panel:  Recent Labs Lab 12/27/12 0114 12/27/12 0540 12/29/12 0545  NA 132*  --  134*  K 4.0  --  3.7  CL 96  --  97  CO2  --   --  27  GLUCOSE 113*  --  126*  BUN 7  --  12  CREATININE 0.80 0.46* 0.51  CALCIUM  --   --  9.6   CBC:  Recent Labs Lab 12/27/12 0100 12/27/12 0114 12/27/12 0540 12/29/12 0545  WBC 13.0*  --  10.7* 14.1*  NEUTROABS 10.7*  --   --   --   HGB 13.3  15.0 12.7 11.9*  HCT 40.3 44.0 39.2 37.1  MCV 91.8  --  92.0 92.8  PLT 397  --  357 340   Cardiac Enzymes:  Recent Labs Lab 12/27/12 0540 12/27/12 1200 12/27/12 1734  TROPONINI <0.30 <0.30 <0.30    Scheduled Meds: . albuterol  2.5 mg Nebulization Q6H WA  . amLODipine  7.5 mg Oral Daily  . aspirin EC  81 mg Oral Daily  . calcium-vitamin D  1 tablet Oral Daily  . cholecalciferol  1,000 Units Oral Daily  . enoxaparin  injection  40 mg Subcutaneous Q24H  . guaiFENesin  600 mg Oral BID  . ipratropium  0.5 mg Nebulization Q6H WA  . levofloxacin  IV  500 mg Intravenous Daily  . magnesium oxide  200 mg Oral Daily  . SOLU-MEDROL injection  60 mg Intravenous Q12H  . multivitamin   1 tablet Oral Daily  .  pantoprazole  40 mg Oral BID  . senna  1 tablet Oral BID  . vitamin C  500 mg Oral Daily  . vitamin E  400 Units Oral Daily   Continuous Infusions:   Zannie Cove, MD  [facility] Pager [phone]  If 7PM-7AM, please contact night-coverage [email or web address] Password [email or web address] 12/30/2012, 2:26 PM   LOS: 4 days

## 2012-12-30 NOTE — Progress Notes (Signed)
Occupational Therapy Treatment Patient Details Name: Virginia Burns MRN: [id number] DOB: June 23, 1945 Today's Date: 12/30/2012 Time: 7829-5621 OT Time Calculation (min): 26 min  OT Assessment / Plan / Recommendation       OT comments  Pt. Able to complete ub/lb bathe/dress with mod I, still requiring cues for PLB tech. And need for rest breaks throughout session.  States no pain, but says she is having severe coughing that she did not have before  Follow Up Recommendations  No OT follow up           Equipment Recommendations  None recommended by OT        Frequency Min 2X/week   Progress towards OT Goals Progress towards OT goals: Progressing toward goals  Plan Discharge plan remains appropriate    Precautions / Restrictions Precautions Precautions: None   Pertinent Vitals/Pain Remained mid 90% of 2L of o2 throughout session    ADL  Grooming: Performed;Wash/dry hands;Wash/dry face;Set up Where Assessed - Grooming: Supported standing Upper Body Bathing: Performed;Chest;Right arm;Left arm;Abdomen;Modified independent Where Assessed - Upper Body Bathing: Supported sitting Lower Body Bathing: Performed;Modified independent Where Assessed - Lower Body Bathing: Unsupported standing Upper Body Dressing: Performed;Set up Where Assessed - Upper Body Dressing: Supported sitting Transfers/Ambulation Related to ADLs: sit/stand for ub/lb bathing, mod i with cues for plb tech. and for rest breaks ADL Comments: pt. able to complete all aspects of ub/lb bathe/dress with initial set up of materials, then mod I with self care components, cont. cues for PLB tech. and need for rest breaks        OT Goals(current goals can now be found in the care plan section)    Visit Information  Last OT Received On: 12/30/12                 Cognition  Cognition Arousal/Alertness: Awake/alert Behavior During Therapy: [facility] for tasks assessed/performed Overall Cognitive Status: Within  Functional Limits for tasks assessed    Mobility  Transfers Transfers: Sit to Stand;Stand to Sit Sit to Stand: 6: Modified independent (Device/Increase time);From chair/3-in-1 Stand to Sit: 6: Modified independent (Device/Increase time);To chair/3-in-1               End of Session OT - End of Session Activity Tolerance: Patient tolerated treatment well Patient left: in chair;with call bell/phone within reach      Robet Leu, COTA/L 12/30/2012, 12:10 PM

## 2012-12-31 DIAGNOSIS — I10 Essential (primary) hypertension: Secondary | ICD-10-CM

## 2012-12-31 MED ORDER — PREDNISONE 20 MG PO TABS: ORAL_TABLET | ORAL | Status: DC

## 2012-12-31 MED ORDER — GUAIFENESIN ER 600 MG PO TB12: 600.0000 mg | ORAL_TABLET | Freq: Two times a day (BID) | ORAL | Status: DC

## 2012-12-31 MED ORDER — POLYETHYLENE GLYCOL 3350 17 G PO PACK: 17.0000 g | PACK | Freq: Two times a day (BID) | ORAL | Status: DC | PRN

## 2012-12-31 MED ORDER — LEVOFLOXACIN 500 MG PO TABS: 500.0000 mg | ORAL_TABLET | Freq: Every day | ORAL | Status: DC

## 2012-12-31 MED ORDER — BENZONATATE 200 MG PO CAPS: 200.0000 mg | ORAL_CAPSULE | Freq: Two times a day (BID) | ORAL | Status: DC | PRN

## 2013-01-09 ENCOUNTER — Encounter: Payer: Self-pay | Admitting: Adult Health

## 2013-01-09 ENCOUNTER — Ambulatory Visit (INDEPENDENT_AMBULATORY_CARE_PROVIDER_SITE_OTHER): Payer: Medicare Other | Admitting: Adult Health

## 2013-01-09 VITALS — BP 142/84 | HR 95 | Temp 98.5°F | Ht 65.75 in | Wt 207.4 lb

## 2013-01-09 DIAGNOSIS — J441 Chronic obstructive pulmonary disease with (acute) exacerbation: Secondary | ICD-10-CM

## 2013-01-09 NOTE — Patient Instructions (Addendum)
Continue on Symbicort 2 puffs Twice daily   Continue on Spriva  1 puff daily  May use Ventolin or Albuterol Neb every 4 hr As needed  Wheezing/shortness of breath.  Follow up 1 month with Dr. Craige Cotta  As planned with PFT

## 2013-01-09 NOTE — Progress Notes (Signed)
  Subjective:    Patient ID: Virginia Burns, female    DOB: June 24, 1945, 67 y.o.   MRN: [id number]  HPI  TESTS: PFT 08/09/07 >> FEV1 1.54 (63%), FVC 2.90 (88%), FEV1% 53, TLC 3.42 (65%), DLCO 64%, no BD  01/09/2013 Post Hospital follow up  Patient returns for a post hospital followup. Patient was admitted August 11-16 for COPD, exacerbation. She was treated with the usual. Regimen of IV antibiotics, steroids, and nebulized bronchodilators. She was discharged on , Levaquin and a prednisone taper. Patient is feeling improved with decreased cough and congestion. She continues to feel somewhat weak. She denies hemoptysis, orthopnea, PND, or leg swelling.    Review of Systems Constitutional:   No  weight loss, night sweats,  Fevers, chills,  +fatigue, or  lassitude.  HEENT:   No headaches,  Difficulty swallowing,  Tooth/dental problems, or  Sore throat,                No sneezing, itching, ear ache,  +nasal congestion, post nasal drip,   CV:  No chest pain,  Orthopnea, PND, swelling in lower extremities, anasarca, dizziness, palpitations, syncope.   GI  No heartburn, indigestion, abdominal pain, nausea, vomiting, diarrhea, change in bowel habits, loss of appetite, bloody stools.   Resp:  No chest wall deformity  Skin: no rash or lesions.  GU: no dysuria, change in color of urine, no urgency or frequency.  No flank pain, no hematuria   MS:  No joint pain or swelling.  No decreased range of motion.  No back pain.  Psych:  No change in mood or affect. No depression or anxiety.  No memory loss.         Objective:   Physical Exam  GEN: A/Ox3; pleasant , NAD, elderly   HEENT:  Berry/AT,  EACs-clear, TMs-wnl, NOSE-clear, THROAT-clear, no lesions, no postnasal drip or exudate noted.   NECK:  Supple w/ fair ROM; no JVD; normal carotid impulses w/o bruits; no thyromegaly or nodules palpated; no lymphadenopathy.  RESP  Diminshed BS in bases no accessory muscle use, no dullness to  percussion  CARD:  RRR, no m/r/g  , no peripheral edema, pulses intact, no cyanosis or clubbing.  GI:   Soft & nt; nml bowel sounds; no organomegaly or masses detected.  Musco: Warm bil, no deformities or joint swelling noted.   Neuro: alert, no focal deficits noted.    Skin: Warm, no lesions or rashes        Assessment & Plan:

## 2013-01-11 NOTE — Discharge Summary (Signed)
Physician Discharge Summary  Virginia Burns WUJ:[id number] DOB: 1945/09/14 DOA: 12/26/2012  PCP: Windy Carina, PA-C  Admit date: 12/26/2012 Discharge date: 12/31/2012  Time spent: 50 minutes  Recommendations for Outpatient Follow-up:  1. Dr. Craige Cotta in 1-2 weeks  Discharge Diagnoses:  Principal Problem:   Acute respiratory failure with hypoxia   COPD with exacerbation   TOBACCO ABUSE   SLEEP APNEA, OBSTRUCTIVE    HYPERTENSION   EROSIVE ESOPHAGITIS   Discharge Condition: improved  Diet recommendation: regular  Filed Weights   12/26/12 2311 12/27/12 0444  Weight: 92.534 kg (204 lb) 93.5 kg (206 lb 2.1 oz)    History of present illness:  Virginia Burns is a 67 y.o. female with medical history significant for COPD, gastroparesis, hypertension and diverticulitis who presents to the emergency department with a 3 to 4-day history of progressive shortness of breath, worse with exertion. She reports that she was seen by Dr. Lynnae January on August 4 and was placed on a prednisone taper and changed to a new COPD medication called Anoro with instruction to use her nebs at home every 6 hours and prn for wheezing and SOB. She experienced a brief improvement and then her symptoms returned and progressively got worse, her prednisone taper ended on the 10th and was followed by a rapid increase in wheezing and shortness of breath. She came to the ED for evaluation when she started to have weakness and could not ambulate to her bathroom without significant distress from dyspnea.   Hospital Course:  Acute respiratory failure with hypoxia  - secondary to acute COPD exacerbation  - slow improvement, treated with IV solumedrol, nebs, ABx - transitioned to Prednisone taper, Po abx and discharged home in a stable condition - PO levaquin for 5 days total  - advised to Fu with Dr.Sood in 1 week  TOBACCO ABUSE  - counseled   HYPERTENSION  - continue amlodipine   EROSIVE ESOPHAGITIS  - stable  clinically, continue Protonix   COPD exacerbation  - management as noted above        Discharge Exam: Filed Vitals:   12/31/12 0708  BP: 132/68  Pulse: 76  Temp: 97.4 F (36.3 C)  Resp: 20    General: AAOx3 Cardiovascular: S1S2/RRR Respiratory: improved air movement  Discharge Instructions  Discharge Orders   Future Appointments Provider Department Dept Phone   02/10/2013 3:00 PM [facility] [phone]   02/10/2013 4:00 PM Coralyn Helling, MD [facility] [phone]   Future Orders Complete By Expires   Diet - low sodium heart healthy  As directed    Increase activity slowly  As directed        Medication List         albuterol 108 (90 BASE) MCG/ACT inhaler  Commonly known as:  PROVENTIL HFA;VENTOLIN HFA  Inhale 2 puffs into the lungs every 4 (four) hours as needed.     albuterol (2.5 MG/3ML) 0.083% nebulizer solution  Commonly known as:  PROVENTIL  Take 2.5 mg by nebulization every 6 (six) hours as needed for wheezing.     amLODipine 5 MG tablet  Commonly known as:  NORVASC  Take 7.5 mg by mouth daily.     aspirin 81 MG tablet  Take 81 mg by mouth daily.     benzonatate 200 MG capsule  Commonly known as:  TESSALON  Take 1 capsule (200 mg total) by mouth 2 (two) times daily as needed for cough.  Calcium-Vitamin D-Minerals 600-400 MG-UNIT Chew  Chew 1 tablet by mouth daily.     cholecalciferol 1000 UNITS tablet  Commonly known as:  VITAMIN D  Take 1,000 Units by mouth daily.     Domperidone BP Powd  TAKE ONE CAPSULE BY MOUTH THREE TIMES A DAY BEFORE MEALS     guaiFENesin 600 MG 12 hr tablet  Commonly known as:  MUCINEX  Take 1 tablet (600 mg total) by mouth 2 (two) times daily. For 5 days     ipratropium 0.02 % nebulizer solution  Commonly known as:  ATROVENT  Take 2.5 mLs (500 mcg total) by nebulization every 6 (six) hours as needed for wheezing.     LORazepam 0.5 MG tablet  Commonly known as:   ATIVAN  Take 0.5 mg by mouth as needed for anxiety.     Magnesium 250 MG Tabs  Take 1 tablet by mouth daily.     methocarbamol 750 MG tablet  Commonly known as:  ROBAXIN  Take 750 mg by mouth at bedtime as needed (muscle cramps).     multivitamin with minerals Tabs tablet  Take 1 tablet by mouth daily.     pantoprazole 40 MG tablet  Commonly known as:  PROTONIX  Take 40 mg by mouth 2 (two) times daily.     polyethylene glycol packet  Commonly known as:  MIRALAX / GLYCOLAX  Take 17 g by mouth 2 (two) times daily as needed.     vitamin C 500 MG tablet  Commonly known as:  ASCORBIC ACID  Take 500 mg by mouth daily.     vitamin E 400 UNIT capsule  Take 400 Units by mouth daily.       Allergies  Allergen Reactions  . Clindamycin/Lincomycin Hives       Follow-up Information   Follow up with SOOD,VINEET, MD In 1 week.   Specialty:  Pulmonary Disease   Contact information:   [address] [place] [address] [phone]        The results of significant diagnostics from this hospitalization (including imaging, microbiology, ancillary and laboratory) are listed below for reference.    Significant Diagnostic Studies: Dg Chest 2 View  12/27/2012   *RADIOLOGY REPORT*  Clinical Data: Shortness of breath.  CHEST - 2 VIEW  Comparison: Chest x-ray 12/19/2012.  Findings: Lung volumes are normal.  No consolidative airspace disease.  No pleural effusions.  No pneumothorax.  No pulmonary nodule or mass noted.  Pulmonary vasculature and the cardiomediastinal silhouette are within normal limits. Atherosclerosis of the thoracic aorta.  IMPRESSION: 1. No radiographic evidence of acute cardiopulmonary disease. 2.  Atherosclerosis.   Original Report Authenticated By: Trudie Reed, M.D.   Dg Chest 2 View  12/19/2012   *RADIOLOGY REPORT*  Clinical Data: Shortness of breath, COPD, hypertension.  CHEST - 2 VIEW  Comparison: 09/26/2007  Findings: There is hyperinflation of the lungs  compatible with COPD.  Scarring in the lung bases.  Heart is normal size.  No acute opacities or effusions.  No acute bony abnormality.  IMPRESSION: COPD/chronic changes.  No acute findings.   Original Report Authenticated By: Charlett Nose, M.D.    Microbiology: No results found for this or any previous visit (from the past 240 hour(s)).   Labs: Basic Metabolic Panel: No results found for this basename: NA, K, CL, CO2, GLUCOSE, BUN, CREATININE, CALCIUM, MG, PHOS,  in the last 168 hours Liver Function Tests: No results found for this basename: AST, ALT, ALKPHOS, BILITOT,  PROT, ALBUMIN,  in the last 168 hours No results found for this basename: LIPASE, AMYLASE,  in the last 168 hours No results found for this basename: AMMONIA,  in the last 168 hours CBC: No results found for this basename: WBC, NEUTROABS, HGB, HCT, MCV, PLT,  in the last 168 hours Cardiac Enzymes: No results found for this basename: CKTOTAL, CKMB, CKMBINDEX, TROPONINI,  in the last 168 hours BNP: BNP (last 3 results)  Recent Labs  12/27/12 0540  PROBNP 102.4   CBG: No results found for this basename: GLUCAP,  in the last 168 hours     Signed:  Arta Stump  [facility] 01/11/2013, 4:04 PM

## 2013-01-12 NOTE — Assessment & Plan Note (Signed)
Reason exacerbation, now resolved.  Plan  Continue on Symbicort 2 puffs Twice daily   Continue on Spriva  1 puff daily  May use Ventolin or Albuterol Neb every 4 hr As needed  Wheezing/shortness of breath.  Follow up 1 month with Dr. Craige Cotta  As planned with PFT

## 2013-02-10 ENCOUNTER — Encounter: Payer: Self-pay | Admitting: Pulmonary Disease

## 2013-02-10 ENCOUNTER — Ambulatory Visit (INDEPENDENT_AMBULATORY_CARE_PROVIDER_SITE_OTHER): Payer: Medicare Other | Admitting: Pulmonary Disease

## 2013-02-10 VITALS — BP 130/78 | HR 94 | Ht 65.0 in | Wt 209.0 lb

## 2013-02-10 DIAGNOSIS — J449 Chronic obstructive pulmonary disease, unspecified: Secondary | ICD-10-CM | POA: Insufficient documentation

## 2013-02-10 DIAGNOSIS — J4489 Other specified chronic obstructive pulmonary disease: Secondary | ICD-10-CM

## 2013-02-10 HISTORY — DX: Chronic obstructive pulmonary disease, unspecified: J44.9

## 2013-02-10 HISTORY — DX: Other specified chronic obstructive pulmonary disease: J44.89

## 2013-02-10 LAB — PULMONARY FUNCTION TEST

## 2013-02-10 NOTE — Progress Notes (Signed)
Chief Complaint  Patient presents with  . COPD    Breathing is unchanged. Reports SOB and wheezing. Denies chest tightness or cough. Has been using flutter valve. PFT was done today.    History of Present Illness: Virginia Burns is a 67 y.o. female former smoker with COPD.  She is here to review her PFT.    She was in hospital since last visit for COPD exacerbation.  She was not able to tolerate change to anoro.  She is using spiriva, and symbicort.  She has not needed albuterol much recently.  She is not having much cough, or wheeze.  She still gets winded with activity, but has been doing better recently.  She is planning to start walking with her son.  She is going to get flu shot and booster pneumonia shot with her PCP.  TESTS: PFT 08/09/07 >> FEV1 1.54 (63%), FEV1% 53, TLC 3.42 (65%), DLCO 64%, no BD PFT 02/10/13 >> FEV1 1.32 (52%), FEV1% 58, TLC 5.07 (96%), DLCO 60%, no BD   Virginia Burns  has a past medical history of Personal history of colonic polyps (10/07/2009); Vitamin B12 deficiency; Gastroparesis; Diverticulosis of colon (without mention of hemorrhage); Family history of malignant neoplasm of gastrointestinal tract; Osteoporosis; Hypertension; Other esophagitis; and COPD (chronic obstructive pulmonary disease).  Virginia Burns  has past surgical history that includes Cholecystectomy and Abdominal hysterectomy.  Prior to Admission medications   Medication Sig Start Date End Date Taking? Authorizing Provider  albuterol (PROVENTIL HFA;VENTOLIN HFA) 108 (90 BASE) MCG/ACT inhaler Inhale 2 puffs into the lungs every 4 (four) hours as needed.   Yes Historical Provider, MD  albuterol (PROVENTIL) (2.5 MG/3ML) 0.083% nebulizer solution Take 2.5 mg by nebulization every 6 (six) hours as needed for wheezing.   Yes Historical Provider, MD  AMBULATORY NON FORMULARY MEDICATION Domperidone 10mg  take one tablet by mouth three times a day before meals 10/27/12  Yes Mardella Layman, MD   amLODipine (NORVASC) 5 MG tablet Take 5 mg by mouth daily.   Yes Historical Provider, MD  aspirin 81 MG tablet Take 81 mg by mouth daily.   Yes Historical Provider, MD  budesonide-formoterol (SYMBICORT) 160-4.5 MCG/ACT inhaler Inhale 2 puffs into the lungs 2 (two) times daily.   Yes Historical Provider, MD  Calcium Carbonate-Vit D-Min (CALCIUM-VITAMIN D-MINERALS) 600-400 MG-UNIT CHEW Chew 1 tablet by mouth daily.   Yes Historical Provider, MD  cholecalciferol (VITAMIN D) 1000 UNITS tablet Take 1,000 Units by mouth daily.   Yes Historical Provider, MD  Cholecalciferol (VITAMIN D3) 3000 UNITS TABS Take 1 tablet by mouth daily.   Yes Historical Provider, MD  Domperidone BP POWD TAKE ONE CAPSULE BY MOUTH THREE TIMES A DAY BEFORE MEALS 02/02/12  Yes Mardella Layman, MD  Flaxseed, Linseed, (FLAX SEED OIL) 1000 MG CAPS Take 1 capsule by mouth daily.   Yes Historical Provider, MD  ipratropium (ATROVENT) 0.02 % nebulizer solution Take 500 mcg by nebulization 2 (two) times daily.   Yes Historical Provider, MD  levocetirizine (XYZAL) 5 MG tablet Take 5 mg by mouth every evening.   Yes Historical Provider, MD  LORazepam (ATIVAN) 0.5 MG tablet Take 0.5 mg by mouth as needed.   Yes Historical Provider, MD  Magnesium 250 MG TABS Take 1 tablet by mouth daily.   Yes Historical Provider, MD  Multiple Vitamin (MULTIVITAMIN) capsule Take 1 capsule by mouth daily.   Yes Historical Provider, MD  pantoprazole (PROTONIX) 40 MG tablet Take 40 mg by mouth  daily.   Yes Historical Provider, MD  tiotropium (SPIRIVA HANDIHALER) 18 MCG inhalation capsule Place 18 mcg into inhaler and inhale daily.   Yes Historical Provider, MD  vitamin C (ASCORBIC ACID) 500 MG tablet Take 500 mg by mouth daily.   Yes Historical Provider, MD  vitamin E 400 UNIT capsule Take 400 Units by mouth daily.   Yes Historical Provider, MD    Allergies  Allergen Reactions  . Clindamycin/Lincomycin Hives     Physical Exam:  General - No  distress ENT - No sinus tenderness, no oral exudate, no LAN Cardiac - s1s2 regular, no murmur Chest - Prolonged exhalation, no wheeze Back - No focal tenderness Abd - Soft, non-tender Ext - No edema Neuro - Normal strength Skin - No rashes Psych - normal mood, and behavior   Assessment/Plan:  Coralyn Helling, MD [facility] Pager:  [phone]

## 2013-02-10 NOTE — Patient Instructions (Signed)
Follow up in 6 months 

## 2013-02-10 NOTE — Progress Notes (Signed)
PFT done today. 

## 2013-02-10 NOTE — Assessment & Plan Note (Signed)
Stable on current inhaler regimen. 

## 2013-03-09 ENCOUNTER — Telehealth: Payer: Self-pay | Admitting: Pulmonary Disease

## 2013-03-09 MED ORDER — BUDESONIDE-FORMOTEROL FUMARATE 160-4.5 MCG/ACT IN AERO: 2.0000 | INHALATION_SPRAY | Freq: Two times a day (BID) | RESPIRATORY_TRACT | Status: DC

## 2013-03-09 MED ORDER — TIOTROPIUM BROMIDE MONOHYDRATE 18 MCG IN CAPS: 18.0000 ug | ORAL_CAPSULE | Freq: Every day | RESPIRATORY_TRACT | Status: DC

## 2013-03-09 NOTE — Telephone Encounter (Signed)
Pt aware samples of spiriva and symbicort left for p/u. Nothing further needed

## 2013-03-23 ENCOUNTER — Other Ambulatory Visit: Payer: Self-pay

## 2013-04-07 ENCOUNTER — Encounter: Payer: Self-pay | Admitting: Gastroenterology

## 2013-04-07 ENCOUNTER — Telehealth: Payer: Self-pay | Admitting: Gastroenterology

## 2013-04-07 NOTE — Telephone Encounter (Signed)
Pt reports she has an "abdominal hernia" that Dr Jarold Motto found and she feels it's getting larger and wants him to assess it. Pt given an appt on 04/11/13.

## 2013-04-11 ENCOUNTER — Ambulatory Visit (INDEPENDENT_AMBULATORY_CARE_PROVIDER_SITE_OTHER): Payer: Medicare Other | Admitting: Gastroenterology

## 2013-04-11 ENCOUNTER — Encounter: Payer: Self-pay | Admitting: Gastroenterology

## 2013-04-11 ENCOUNTER — Telehealth: Payer: Self-pay | Admitting: Pulmonary Disease

## 2013-04-11 VITALS — BP 124/80 | HR 100 | Ht 66.0 in | Wt 210.2 lb

## 2013-04-11 DIAGNOSIS — K3184 Gastroparesis: Secondary | ICD-10-CM

## 2013-04-11 DIAGNOSIS — R251 Tremor, unspecified: Secondary | ICD-10-CM

## 2013-04-11 DIAGNOSIS — R259 Unspecified abnormal involuntary movements: Secondary | ICD-10-CM

## 2013-04-11 MED ORDER — PRAMOXINE-HC 1-1 % EX CREA: TOPICAL_CREAM | Freq: Three times a day (TID) | CUTANEOUS | Status: DC

## 2013-04-11 MED ORDER — TIOTROPIUM BROMIDE MONOHYDRATE 18 MCG IN CAPS: 18.0000 ug | ORAL_CAPSULE | Freq: Every day | RESPIRATORY_TRACT | Status: DC

## 2013-04-11 MED ORDER — VSL#3 PO CAPS: 1.0000 | ORAL_CAPSULE | Freq: Every day | ORAL | Status: DC

## 2013-04-11 NOTE — Telephone Encounter (Signed)
Pt returned call.  Spoke with patient who reported that she has enough Spiriva to last her to the end of November, but is requesting 2 samples of this medication as she is currently in the "donut hole" and this medication will cost her $600.  Pt has appt with GI this afternoon and would like to pick up samples at that time.  2 samples obtained for patient, documented per protocol and placed up front for pick up.  Last ov 9.26.14 w/ VS, follow up in 6 months. Nothing further needed at this time; will sign off.

## 2013-04-11 NOTE — Patient Instructions (Signed)
We have sent the following medications to your pharmacy for you to pick up at your convenience: Analpram Cream   We have given you samples of the following medication to take: VSL # 3 Probiotic, please take one capsule by mouth once daily. Take for two weeks then stop PLEASE KEEP IN THE REFRIGERATOR   You have an appointment with [facility] on 05-10-2013 at 830 am, arrive at 8 am to register  Address is: [address]          [address] Phone number to reshedule: [phone]

## 2013-04-11 NOTE — Telephone Encounter (Signed)
lmomtcb x1 

## 2013-04-11 NOTE — Progress Notes (Signed)
This is a 67 year old Caucasian female with severe gastroparesis managed with domperidone 10 mg 3 times a day.  She has mild COPD and uses inhalers.  She denies any acid reflux symptoms.  She recently had a bout of bile gastroenteritis with some nausea vomiting and diarrhea.  She currently is asymptomatic and denies GI complaints.  She is on multiple medications listed and reviewed her record.  She been able to keep her weight level, and her appetite is good without any specific food intolerances.  She gives no history of any hepatobiliary score lower GI symptomatology, but is due for followup colonoscopy in January.  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  She's had no neurological side effects of any prokinetic agents to date.  Current Medications, Allergies, Past Medical History, Past Surgical History, Family History and Social History were reviewed in [employer] record.  ROS: All systems were reviewed and are negative unless otherwise stated in the HPI.          Physical Exam: Healthy patient in no distress appears stated age.  Her pressure 124/80, pulse 100, weight 210 with a BMI of 33.94.  I cannot appreciate stigmata of chronic liver disease.  Her chest is clear she is in a regular rhythm without murmurs gallops or rubs.  Her abdomen shows no distention, organomegaly, masses or tenderness.  Bowel sounds are normal.  There is no succussion splash.  Mental status is normal.  She does have a fine tremor of her hands bilaterally without other focal gross neurologic deficits.    Assessment and Plan: Severe documented chronic gastroparesis doing well on domperidone 10 mg 3 times a day before meals.  I do not think her current tremors related to this medication since it does not cross the blood-brain barrier.  However we will seek neurological consultation with Dr. Allena Katz given her family history of  familial tremor and possibility that she could be treated with benzodiazepines.  She is to continue all other medications as listed and reviewed her record with her gastroparesis diet restrictions.

## 2013-04-17 ENCOUNTER — Other Ambulatory Visit: Payer: Self-pay | Admitting: Orthopedic Surgery

## 2013-04-17 ENCOUNTER — Emergency Department (HOSPITAL_COMMUNITY)
Admission: EM | Admit: 2013-04-17 | Discharge: 2013-04-17 | Disposition: A | Discharge status: Home / Self Care | Payer: Medicare Other | Attending: Orthopedic Surgery | Admitting: Orthopedic Surgery

## 2013-04-17 ENCOUNTER — Encounter (HOSPITAL_COMMUNITY): Payer: Self-pay | Admitting: Emergency Medicine

## 2013-04-17 ENCOUNTER — Emergency Department (HOSPITAL_COMMUNITY): Payer: Medicare Other

## 2013-04-17 DIAGNOSIS — K3184 Gastroparesis: Secondary | ICD-10-CM | POA: Insufficient documentation

## 2013-04-17 DIAGNOSIS — Y92009 Unspecified place in unspecified non-institutional (private) residence as the place of occurrence of the external cause: Secondary | ICD-10-CM | POA: Insufficient documentation

## 2013-04-17 DIAGNOSIS — S52502B Unspecified fracture of the lower end of left radius, initial encounter for open fracture type I or II: Secondary | ICD-10-CM

## 2013-04-17 DIAGNOSIS — J4489 Other specified chronic obstructive pulmonary disease: Secondary | ICD-10-CM | POA: Insufficient documentation

## 2013-04-17 DIAGNOSIS — J449 Chronic obstructive pulmonary disease, unspecified: Secondary | ICD-10-CM | POA: Insufficient documentation

## 2013-04-17 DIAGNOSIS — Z800 Family history of malignant neoplasm of digestive organs: Secondary | ICD-10-CM | POA: Insufficient documentation

## 2013-04-17 DIAGNOSIS — I10 Essential (primary) hypertension: Secondary | ICD-10-CM | POA: Insufficient documentation

## 2013-04-17 DIAGNOSIS — S52509A Unspecified fracture of the lower end of unspecified radius, initial encounter for closed fracture: Secondary | ICD-10-CM | POA: Insufficient documentation

## 2013-04-17 DIAGNOSIS — Y9301 Activity, walking, marching and hiking: Secondary | ICD-10-CM | POA: Insufficient documentation

## 2013-04-17 DIAGNOSIS — K208 Other esophagitis without bleeding: Secondary | ICD-10-CM | POA: Insufficient documentation

## 2013-04-17 DIAGNOSIS — Z860100 Personal history of colon polyps, unspecified: Secondary | ICD-10-CM | POA: Insufficient documentation

## 2013-04-17 DIAGNOSIS — E538 Deficiency of other specified B group vitamins: Secondary | ICD-10-CM | POA: Insufficient documentation

## 2013-04-17 DIAGNOSIS — M810 Age-related osteoporosis without current pathological fracture: Secondary | ICD-10-CM | POA: Insufficient documentation

## 2013-04-17 DIAGNOSIS — F17200 Nicotine dependence, unspecified, uncomplicated: Secondary | ICD-10-CM | POA: Insufficient documentation

## 2013-04-17 DIAGNOSIS — W010XXA Fall on same level from slipping, tripping and stumbling without subsequent striking against object, initial encounter: Secondary | ICD-10-CM | POA: Insufficient documentation

## 2013-04-17 DIAGNOSIS — K5730 Diverticulosis of large intestine without perforation or abscess without bleeding: Secondary | ICD-10-CM | POA: Insufficient documentation

## 2013-04-17 LAB — BASIC METABOLIC PANEL
CO2: 25 mEq/L (ref 19–32)
Calcium: 9.5 mg/dL (ref 8.4–10.5)
Chloride: 92 mEq/L — ABNORMAL LOW (ref 96–112)
Creatinine, Ser: 0.68 mg/dL (ref 0.50–1.10)
GFR calc Af Amer: >90 mL/min (ref >90)
GFR calc non Af Amer: 89 mL/min — ABNORMAL LOW (ref >90)
Potassium: 3.9 mEq/L (ref 3.5–5.1)
Sodium: 130 mEq/L — ABNORMAL LOW (ref 135–145)

## 2013-04-17 LAB — CBC
HCT: 40.6 % (ref 36.0–46.0)
MCHC: 34.2 g/dL (ref 30.0–36.0)
MCV: 91 fL (ref 78.0–100.0)
Platelets: 421 10*3/uL — ABNORMAL HIGH (ref 150–400)
RBC: 4.46 MIL/uL (ref 3.87–5.11)
WBC: 11.1 10*3/uL — ABNORMAL HIGH (ref 4.0–10.5)

## 2013-04-17 SURGERY — Surgical Case
Anesthesia: *Unknown

## 2013-04-17 MED ORDER — HYDROMORPHONE HCL PF 1 MG/ML IJ SOLN
1.0000 mg | Freq: Once | INTRAMUSCULAR | Status: AC
Start: 2013-04-17 — End: 2013-04-17
  Administered 2013-04-17: 1 mg via INTRAVENOUS
  Filled 2013-04-17: qty 1

## 2013-04-17 MED ORDER — AMOXICILLIN-POT CLAVULANATE 875-125 MG PO TABS: 1.0000 | ORAL_TABLET | Freq: Two times a day (BID) | ORAL | Status: DC

## 2013-04-17 MED ORDER — SODIUM CHLORIDE 0.9 % IV SOLN
INTRAVENOUS | Status: DC
  Administered 2013-04-17: 1 mL via INTRAVENOUS

## 2013-04-17 MED ORDER — SODIUM CHLORIDE 0.9 % IV SOLN: INTRAVENOUS | Status: DC

## 2013-04-17 MED ORDER — HYDROCODONE-ACETAMINOPHEN 5-325 MG PO TABS: 1.0000 | ORAL_TABLET | Freq: Four times a day (QID) | ORAL | Status: DC | PRN

## 2013-04-17 MED ORDER — CEFAZOLIN SODIUM 1-5 GM-% IV SOLN
1.0000 g | Freq: Once | INTRAVENOUS | Status: AC
  Administered 2013-04-17: 1 g via INTRAVENOUS
  Filled 2013-04-17 (×2): qty 50

## 2013-04-17 MED ORDER — LIDOCAINE-EPINEPHRINE (PF) 1 %-1:200000 IJ SOLN
INTRAMUSCULAR | Status: AC
  Filled 2013-04-17: qty 10

## 2013-04-17 NOTE — ED Notes (Signed)
Patient tripped today and fell on left wrist.  Deformity, open fracture present.  Splinted by EMS.  Patient has received of Fentanyl IV.

## 2013-04-17 NOTE — Progress Notes (Signed)
Pt added on at 5pm-tried to call-did leave instructions-was in ER today-ER still in computer

## 2013-04-17 NOTE — ED Notes (Signed)
Bed: WA02 Expected date:  Expected time:  Means of arrival:  Comments: ems 

## 2013-04-17 NOTE — ED Provider Notes (Addendum)
CSN: [id number]     Arrival date & time 04/17/13  1335 History   First MD Initiated Contact with Patient 04/17/13 1346     Chief Complaint  Patient presents with  . Wrist Injury   (Consider location/radiation/quality/duration/timing/severity/associated sxs/prior Treatment) Patient is a 67 y.o. female presenting with wrist injury. The history is provided by the patient and the EMS personnel.  Wrist Injury Associated symptoms: no back pain, no fever and no neck pain   pt tripped over leg of table just pta onto outstretched left hand/wrist. C/o deformity, open injury to left wrist. Pain mod-sev, constant, dull, non radiating, improved w pain meds from ems. Pt denies other injury. No head injury or headache. No loc. No neck or back pain. No numbness/weakness. Pt is right hand dominant. Last tetanus shot 3 yrs ago. States prior to trip and fall felt in her baseline, well state of health.     Past Medical History  Diagnosis Date  . Personal history of colonic polyps 10/07/2009    tubular adenoma  . Vitamin B12 deficiency   . Gastroparesis   . Diverticulosis of colon (without mention of hemorrhage)   . Family history of malignant neoplasm of gastrointestinal tract   . Osteoporosis   . Hypertension   . Other esophagitis   . COPD (chronic obstructive pulmonary disease)    Past Surgical History  Procedure Laterality Date  . Cholecystectomy    . Abdominal hysterectomy     Family History  Problem Relation Age of Onset  . COPD Mother   . Colon cancer Cousin   . Diabetes Brother   . Colitis Son    History  Substance Use Topics  . Smoking status: Former Smoker -- 1.00 packs/day for 25 years    Types: Cigarettes    Quit date: 05/18/2010  . Smokeless tobacco: Never Used  . Alcohol Use: Yes     Comment: 2-3 every night 3 x week   OB History   Grav Para Term Preterm Abortions TAB SAB Ect Mult Living                 Review of Systems  Constitutional: Negative for fever.  HENT:  Negative for nosebleeds.   Eyes: Negative for redness.  Respiratory: Negative for shortness of breath.   Cardiovascular: Negative for chest pain.  Gastrointestinal: Negative for vomiting and abdominal pain.  Genitourinary: Negative for flank pain.  Musculoskeletal: Negative for back pain and neck pain.  Skin: Positive for wound.  Neurological: Negative for weakness, numbness and headaches.  Hematological: Does not bruise/bleed easily.  Psychiatric/Behavioral: Negative for confusion.    Allergies  Clindamycin/lincomycin  Home Medications   Current Outpatient Rx  Name  Route  Sig  Dispense  Refill  . albuterol (PROVENTIL HFA;VENTOLIN HFA) 108 (90 BASE) MCG/ACT inhaler   Inhalation   Inhale 2 puffs into the lungs every 4 (four) hours as needed.         Marland Bruss albuterol (PROVENTIL) (2.5 MG/3ML) 0.083% nebulizer solution   Nebulization   Take 2.5 mg by nebulization every 6 (six) hours as needed for wheezing.         Marland Lieber amLODipine (NORVASC) 5 MG tablet   Oral   Take 7.5 mg by mouth daily.          Marland Tsutsui aspirin 81 MG tablet   Oral   Take 81 mg by mouth daily.         . benzonatate (TESSALON) 200 MG capsule   Oral  Take 1 capsule (200 mg total) by mouth 2 (two) times daily as needed for cough.   20 capsule   0   . budesonide-formoterol (SYMBICORT) 160-4.5 MCG/ACT inhaler   Inhalation   Inhale 2 puffs into the lungs 2 (two) times daily.   1 Inhaler   0   . Calcium Carbonate-Vit D-Min (CALCIUM-VITAMIN D-MINERALS) 600-400 MG-UNIT CHEW   Oral   Chew 1 tablet by mouth daily.         . cholecalciferol (VITAMIN D) 1000 UNITS tablet   Oral   Take 1,000 Units by mouth daily.         . Domperidone BP POWD      TAKE ONE CAPSULE BY MOUTH THREE TIMES A DAY BEFORE MEALS   270 g   2   . guaiFENesin (MUCINEX) 600 MG 12 hr tablet   Oral   Take 1 tablet (600 mg total) by mouth 2 (two) times daily. For 5 days   10 tablet   0   . ipratropium (ATROVENT) 0.02 % nebulizer  solution   Nebulization   Take 2.5 mLs (500 mcg total) by nebulization every 6 (six) hours as needed for wheezing.   75 mL      . LORazepam (ATIVAN) 0.5 MG tablet   Oral   Take 0.5 mg by mouth as needed for anxiety.          . Magnesium 250 MG TABS   Oral   Take 1 tablet by mouth daily.         . methocarbamol (ROBAXIN) 750 MG tablet   Oral   Take 750 mg by mouth at bedtime as needed (muscle cramps).         . Multiple Vitamin (MULTIVITAMIN WITH MINERALS) TABS tablet   Oral   Take 1 tablet by mouth daily.         . pantoprazole (PROTONIX) 40 MG tablet   Oral   Take 40 mg by mouth 2 (two) times daily.          . polyethylene glycol (MIRALAX / GLYCOLAX) packet   Oral   Take 17 g by mouth 2 (two) times daily as needed.   14 each   0   . pramoxine-hydrocortisone (ANALPRAM HC) cream   Topical   Apply topically 3 (three) times daily.   30 g   1   . Probiotic Product (VSL#3) CAPS   Oral   Take 1 capsule by mouth daily.   20 capsule   0   . tiotropium (SPIRIVA) 18 MCG inhalation capsule   Inhalation   Place 1 capsule (18 mcg total) into inhaler and inhale daily.   20 capsule   0   . vitamin C (ASCORBIC ACID) 500 MG tablet   Oral   Take 500 mg by mouth daily.         . vitamin E 400 UNIT capsule   Oral   Take 400 Units by mouth daily.          BP 136/74  Pulse 87  Temp(Src) 98.2 F (36.8 C) (Oral)  Resp 20  SpO2 96% Physical Exam  Nursing note and vitals reviewed. Constitutional: She appears well-developed and well-nourished. No distress.  HENT:  Head: Atraumatic.  Eyes: Conjunctivae are normal. No scleral icterus.  Neck: Normal range of motion. Neck supple. No tracheal deviation present.  Cardiovascular: Normal rate and intact distal pulses.   Pulmonary/Chest: Effort normal and breath sounds normal. No respiratory distress. She exhibits  no tenderness.  Abdominal: Soft. Normal appearance. She exhibits no distension. There is no  tenderness.  Musculoskeletal:  Deformity left wrist. Radial pulse 2+. Normal cap refill distally in finger tips. Open wound approximately 1-2 cm on ulnar aspect left wrist. Splint in place.    Neurological: She is alert.  R/m/u nerve fxn intact LUE/hand.   Skin: Skin is warm and dry. No rash noted. She is not diaphoretic.  Psychiatric: She has a normal mood and affect.    ED Course  Procedures (including critical care time)  EKG Interpretation   None      Results for orders placed during the hospital encounter of 04/17/13  CBC      Result Value Range   WBC 11.1 (*) 4.0 - 10.5 K/uL   RBC 4.46  3.87 - 5.11 MIL/uL   Hemoglobin 13.9  12.0 - 15.0 g/dL   HCT 16.1  09.6 - 04.5 %   MCV 91.0  78.0 - 100.0 fL   MCH 31.2  26.0 - 34.0 pg   MCHC 34.2  30.0 - 36.0 g/dL   RDW 40.9  81.1 - 91.4 %   Platelets 421 (*) 150 - 400 K/uL   Dg Wrist Complete Left  04/17/2013   CLINICAL DATA:  Status post fall with wrist pain and deformity  EXAM: LEFT WRIST - COMPLETE 3+ VIEW  COMPARISON:  None.  FINDINGS: There is a comminuted intra-articular impacted fracture of the distal radius. There is fracture of the ulna styloid. There is dislocation of the distal ulna and radial shaft relative to the carpal bones.  IMPRESSION: Fracture/dislocation of the distal radius and ulna.   Electronically Signed   By: Sherian Rein M.D.   On: 04/17/2013 14:26      MDM  Iv ns. Confirmed only allergy is to clinda and lincomycin.  Ancef iv. Moist sterile dressing to open wound. Elevate wrist. Splint.  Xray.  Pt states has seen Dr Amanda Pea in past, requests -  Paged re open fx.  Recheck pain improved after dilaudid 1 mg iv.  Awaiting ortho/hand call back - anticipate to OR.  Recheck radial pulse 2+, pain controlled.  Ortho/hand repaged x 3, awaiting return call.   Dr Amanda Pea called back at 1600 and indicated unavailable to see, to call on call for unassigned hand -  Have re-paged.       Suzi Roots,  MD 04/17/13 [phone]

## 2013-04-17 NOTE — Consult Note (Addendum)
ORTHOPAEDIC CONSULTATION HISTORY & PHYSICAL REQUESTING PHYSICIAN: Jodi Marble, MD  Chief Complaint: Left distal radius and ulna fracture  HPI: Virginia Burns is a 67 y.o. female who fell backwards on outstretched hands, sustaining injury to left wrist. She denies loss of consciousness or preceding dizziness or lightheadedness. A provisional splint has been applied. She has received Kefzol, and last tetanus was reported as  "3 years at the most."  Past Medical History  Diagnosis Date  . Personal history of colonic polyps 10/07/2009    tubular adenoma  . Vitamin B12 deficiency   . Gastroparesis   . Diverticulosis of colon (without mention of hemorrhage)   . Family history of malignant neoplasm of gastrointestinal tract   . Osteoporosis   . Hypertension   . Other esophagitis   . COPD (chronic obstructive pulmonary disease)    Past Surgical History  Procedure Laterality Date  . Cholecystectomy    . Abdominal hysterectomy     History   Social History  . Marital Status: Single    Spouse Name: N/A    Number of Children: 1  . Years of Education: N/A   Occupational History  . Realtor    Social History Main Topics  . Smoking status: Former Smoker -- 1.00 packs/day for 25 years    Types: Cigarettes    Quit date: 05/18/2010  . Smokeless tobacco: Never Used  . Alcohol Use: Yes     Comment: 2-3 every night 3 x week  . Drug Use: No  . Sexual Activity: None   Other Topics Concern  . None   Social History Narrative  . None   Family History  Problem Relation Age of Onset  . COPD Mother   . Colon cancer Cousin   . Diabetes Brother   . Colitis Son    Allergies  Allergen Reactions  . Clindamycin/Lincomycin Hives   Prior to Admission medications   Medication Sig Start Date End Date Taking? Authorizing Provider  albuterol (PROVENTIL HFA;VENTOLIN HFA) 108 (90 BASE) MCG/ACT inhaler Inhale 2 puffs into the lungs every 4 (four) hours as needed.   Yes Historical  Provider, MD  amLODipine (NORVASC) 5 MG tablet Take 7.5 mg by mouth daily.    Yes Historical Provider, MD  aspirin 81 MG tablet Take 81 mg by mouth daily.   Yes Historical Provider, MD  budesonide-formoterol (SYMBICORT) 160-4.5 MCG/ACT inhaler Inhale 2 puffs into the lungs 2 (two) times daily. 03/09/13  Yes Coralyn Helling, MD  Calcium Carbonate-Vit D-Min (CALCIUM-VITAMIN D-MINERALS) 600-400 MG-UNIT CHEW Chew 1 tablet by mouth daily.   Yes Historical Provider, MD  cholecalciferol (VITAMIN D) 1000 UNITS tablet Take 1,000 Units by mouth daily.   Yes Historical Provider, MD  Domperidone BP POWD TAKE ONE CAPSULE BY MOUTH THREE TIMES A DAY BEFORE MEALS 02/02/12  Yes Mardella Layman, MD  guaiFENesin (MUCINEX) 600 MG 12 hr tablet Take 1 tablet (600 mg total) by mouth 2 (two) times daily. For 5 days 12/31/12  Yes Zannie Cove, MD  LORazepam (ATIVAN) 0.5 MG tablet Take 0.5 mg by mouth as needed for anxiety.    Yes Historical Provider, MD  Magnesium 250 MG TABS Take 1 tablet by mouth daily.   Yes Historical Provider, MD  methocarbamol (ROBAXIN) 750 MG tablet Take 750 mg by mouth at bedtime as needed (muscle cramps).   Yes Historical Provider, MD  Multiple Vitamin (MULTIVITAMIN WITH MINERALS) TABS tablet Take 1 tablet by mouth daily.   Yes Historical Provider, MD  pantoprazole (PROTONIX) 40 MG tablet Take 40 mg by mouth 2 (two) times daily.    Yes Historical Provider, MD  polyethylene glycol (MIRALAX / GLYCOLAX) packet Take 17 g by mouth 2 (two) times daily as needed. 12/31/12  Yes Zannie Cove, MD  Probiotic Product (VSL#3) CAPS Take 1 capsule by mouth daily. 04/11/13  Yes Mardella Layman, MD  tiotropium (SPIRIVA) 18 MCG inhalation capsule Place 1 capsule (18 mcg total) into inhaler and inhale daily. 03/09/13  Yes Coralyn Helling, MD  vitamin C (ASCORBIC ACID) 500 MG tablet Take 500 mg by mouth daily.   Yes Historical Provider, MD  vitamin E 400 UNIT capsule Take 400 Units by mouth daily.   Yes Historical  Provider, MD   Dg Wrist Complete Left  04/17/2013   CLINICAL DATA:  Status post fall with wrist pain and deformity  EXAM: LEFT WRIST - COMPLETE 3+ VIEW  COMPARISON:  None.  FINDINGS: There is a comminuted intra-articular impacted fracture of the distal radius. There is fracture of the ulna styloid. There is dislocation of the distal ulna and radial shaft relative to the carpal bones.  IMPRESSION: Fracture/dislocation of the distal radius and ulna.   Electronically Signed   By: Sherian Rein M.D.   On: 04/17/2013 14:26    Positive ROS: All other systems have been reviewed and were otherwise negative with the exception of those mentioned in the HPI and as above.  Physical Exam: Vitals: Refer to EMR. Constitutional:  WD, WN, NAD HEENT:  NCAT, EOMI Neuro/Psych:  Alert & oriented to person, place, and time; appropriate mood & affect Lymphatic: No generalized UE edema or lymphadenopathy Extremities / MSK:  The extremities are normal with respect to appearance, ranges of motion, joint stability, muscle strength/tone, sensation, & perfusion except as otherwise noted:   2cm skin tear at ulnar wrist, distal to distal ulna.  2  More proximal skin tears about 1-1.5 cm each.  Obvious fx of distal radius, with dorsal and radial displacement.  + LT sens R/M/U and intact motor to same.  Fingers warm, brisk CR. Compartments soft.  Assessment: Comminuted left distal radius fracture with dorsal and radial displacement, ulnar side open wound without bone visible or palpable  Plan: Hematoma block administered with 1% lidocaine with epinephrine. There was no egress of fluid out the ulnar side open wound with administration of a hematoma block. Proximally open wound, field block was administered with the same anesthetic. The open wound is copiously irrigated under running water in the sink and Xeroform applied to all the skin tags and wrapped with Coban. Manipulative reduction performed, fracture grossly normally  aligned. Sugar tong splint applied. No change in post reduction neurovascular exam. This patient has already been scheduled for surgical treatment at [facility] tomorrow.  Instructions given. Swelling and neurovascular precautions also reviewed as well as the need to elevate the digits. She prefers hydrocodone for pain.  Augmentin for 5 days.  Cliffton Asters Janee Morn, MD     Mobile [phone] Orthopaedic & Hand Surgery [facility] [address] [place], [place]  [address] [phone]

## 2013-04-17 NOTE — ED Notes (Signed)
Dr. Janee Morn at bedside to splint patient's left forearm and wrist.  Patient for surgery tomorrow.

## 2013-04-18 ENCOUNTER — Encounter (HOSPITAL_BASED_OUTPATIENT_CLINIC_OR_DEPARTMENT_OTHER): Payer: Self-pay | Admitting: *Deleted

## 2013-04-18 ENCOUNTER — Encounter (HOSPITAL_BASED_OUTPATIENT_CLINIC_OR_DEPARTMENT_OTHER)
Admission: RE | Disposition: A | Discharge status: Home / Self Care | Payer: Self-pay | Source: Ambulatory Visit | Attending: Orthopedic Surgery

## 2013-04-18 ENCOUNTER — Ambulatory Visit (HOSPITAL_BASED_OUTPATIENT_CLINIC_OR_DEPARTMENT_OTHER): Payer: Medicare Other | Admitting: *Deleted

## 2013-04-18 ENCOUNTER — Ambulatory Visit (HOSPITAL_COMMUNITY): Payer: Medicare Other

## 2013-04-18 ENCOUNTER — Ambulatory Visit (HOSPITAL_BASED_OUTPATIENT_CLINIC_OR_DEPARTMENT_OTHER)
Admission: RE | Admit: 2013-04-18 | Discharge: 2013-04-18 | Disposition: A | Discharge status: Home / Self Care | Payer: Medicare Other | Source: Ambulatory Visit | Attending: Orthopedic Surgery | Admitting: Orthopedic Surgery

## 2013-04-18 ENCOUNTER — Ambulatory Visit (HOSPITAL_BASED_OUTPATIENT_CLINIC_OR_DEPARTMENT_OTHER): Admit: 2013-04-18 | Payer: Self-pay | Admitting: Orthopedic Surgery

## 2013-04-18 ENCOUNTER — Encounter (HOSPITAL_BASED_OUTPATIENT_CLINIC_OR_DEPARTMENT_OTHER): Payer: Medicare Other | Admitting: *Deleted

## 2013-04-18 DIAGNOSIS — S52509A Unspecified fracture of the lower end of unspecified radius, initial encounter for closed fracture: Secondary | ICD-10-CM | POA: Insufficient documentation

## 2013-04-18 DIAGNOSIS — J449 Chronic obstructive pulmonary disease, unspecified: Secondary | ICD-10-CM | POA: Insufficient documentation

## 2013-04-18 DIAGNOSIS — W010XXA Fall on same level from slipping, tripping and stumbling without subsequent striking against object, initial encounter: Secondary | ICD-10-CM | POA: Insufficient documentation

## 2013-04-18 DIAGNOSIS — Z7982 Long term (current) use of aspirin: Secondary | ICD-10-CM | POA: Insufficient documentation

## 2013-04-18 DIAGNOSIS — S61509A Unspecified open wound of unspecified wrist, initial encounter: Secondary | ICD-10-CM | POA: Insufficient documentation

## 2013-04-18 DIAGNOSIS — I10 Essential (primary) hypertension: Secondary | ICD-10-CM | POA: Insufficient documentation

## 2013-04-18 DIAGNOSIS — J4489 Other specified chronic obstructive pulmonary disease: Secondary | ICD-10-CM | POA: Insufficient documentation

## 2013-04-18 HISTORY — PX: LIGAMENT REPAIR: SHX5444

## 2013-04-18 HISTORY — PX: OPEN REDUCTION INTERNAL FIXATION (ORIF) DISTAL RADIAL FRACTURE: SHX5989

## 2013-04-18 SURGERY — OPEN REDUCTION INTERNAL FIXATION (ORIF) DISTAL RADIUS FRACTURE
Anesthesia: General | Site: Wrist | Laterality: Left

## 2013-04-18 MED ORDER — HYDROMORPHONE HCL PF 1 MG/ML IJ SOLN
INTRAMUSCULAR | Status: AC
  Filled 2013-04-18: qty 1

## 2013-04-18 MED ORDER — BUPIVACAINE-EPINEPHRINE PF 0.5-1:200000 % IJ SOLN
INTRAMUSCULAR | Status: AC
  Filled 2013-04-18: qty 30

## 2013-04-18 MED ORDER — LACTATED RINGERS IV SOLN
INTRAVENOUS | Status: DC
  Administered 2013-04-18 (×2): via INTRAVENOUS

## 2013-04-18 MED ORDER — FENTANYL CITRATE 0.05 MG/ML IJ SOLN
INTRAMUSCULAR | Status: DC | PRN
  Administered 2013-04-18 (×4): 50 ug via INTRAVENOUS
  Administered 2013-04-18: 100 ug via INTRAVENOUS

## 2013-04-18 MED ORDER — DEXAMETHASONE SODIUM PHOSPHATE 4 MG/ML IJ SOLN
INTRAMUSCULAR | Status: DC | PRN
  Administered 2013-04-18: 10 mg via INTRAVENOUS

## 2013-04-18 MED ORDER — LIDOCAINE HCL (CARDIAC) 20 MG/ML IV SOLN
INTRAVENOUS | Status: DC | PRN
  Administered 2013-04-18: 80 mg via INTRAVENOUS

## 2013-04-18 MED ORDER — CEFAZOLIN SODIUM-DEXTROSE 2-3 GM-% IV SOLR
2.0000 g | INTRAVENOUS | Status: AC
  Administered 2013-04-18: 2 g via INTRAVENOUS

## 2013-04-18 MED ORDER — ONDANSETRON HCL 4 MG/2ML IJ SOLN
INTRAMUSCULAR | Status: DC | PRN
  Administered 2013-04-18: 4 mg via INTRAVENOUS

## 2013-04-18 MED ORDER — BUPIVACAINE-EPINEPHRINE 0.5% -1:200000 IJ SOLN
INTRAMUSCULAR | Status: DC | PRN
  Administered 2013-04-18: 10 mL

## 2013-04-18 MED ORDER — FENTANYL CITRATE 0.05 MG/ML IJ SOLN
INTRAMUSCULAR | Status: AC
  Filled 2013-04-18: qty 8

## 2013-04-18 MED ORDER — OXYCODONE HCL 5 MG PO TABS
ORAL_TABLET | ORAL | Status: AC
  Filled 2013-04-18: qty 1

## 2013-04-18 MED ORDER — MIDAZOLAM HCL 2 MG/2ML IJ SOLN
INTRAMUSCULAR | Status: AC
  Filled 2013-04-18: qty 2

## 2013-04-18 MED ORDER — PROPOFOL 10 MG/ML IV BOLUS
INTRAVENOUS | Status: DC | PRN
  Administered 2013-04-18: 160 mg via INTRAVENOUS

## 2013-04-18 MED ORDER — SUCCINYLCHOLINE CHLORIDE 20 MG/ML IJ SOLN
INTRAMUSCULAR | Status: DC | PRN
  Administered 2013-04-18: 100 mg via INTRAVENOUS

## 2013-04-18 MED ORDER — HYDROMORPHONE HCL PF 1 MG/ML IJ SOLN
0.2500 mg | INTRAMUSCULAR | Status: DC | PRN
  Administered 2013-04-18 (×3): 0.5 mg via INTRAVENOUS

## 2013-04-18 MED ORDER — CHLORHEXIDINE GLUCONATE 4 % EX LIQD: 60.0000 mL | Freq: Once | CUTANEOUS | Status: DC

## 2013-04-18 MED ORDER — MIDAZOLAM HCL 5 MG/5ML IJ SOLN
INTRAMUSCULAR | Status: DC | PRN
  Administered 2013-04-18: 2 mg via INTRAVENOUS

## 2013-04-18 MED ORDER — OXYCODONE HCL 5 MG PO TABS
5.0000 mg | ORAL_TABLET | Freq: Once | ORAL | Status: AC | PRN
  Administered 2013-04-18: 5 mg via ORAL

## 2013-04-18 MED ORDER — OXYCODONE HCL 5 MG/5ML PO SOLN: 5.0000 mg | Freq: Once | ORAL | Status: AC | PRN

## 2013-04-18 SURGICAL SUPPLY — 61 items
BANDAGE COBAN STERILE 2 (GAUZE/BANDAGES/DRESSINGS) IMPLANT
BANDAGE GAUZE ELAST BULKY 4 IN (GAUZE/BANDAGES/DRESSINGS) ×4 IMPLANT
BIT DRILL SOLID 2.0X40MM (BIT) IMPLANT
BIT DRILL SOLID 2.5X40MM (BIT) IMPLANT
BLADE MINI RND TIP GREEN BEAV (BLADE) IMPLANT
BLADE SURG 15 STRL LF DISP TIS (BLADE) ×1 IMPLANT
BLADE SURG 15 STRL SS (BLADE) ×2
BNDG CMPR 9X4 STRL LF SNTH (GAUZE/BANDAGES/DRESSINGS) ×1
BNDG COHESIVE 4X5 TAN STRL (GAUZE/BANDAGES/DRESSINGS) ×2 IMPLANT
BNDG ESMARK 4X9 LF (GAUZE/BANDAGES/DRESSINGS) ×2 IMPLANT
BRUSH SCRUB EZ PLAIN DRY (MISCELLANEOUS) ×1 IMPLANT
CANISTER SUCT 1200ML W/VALVE (MISCELLANEOUS) ×2 IMPLANT
CHLORAPREP W/TINT 26ML (MISCELLANEOUS) ×2 IMPLANT
CORDS BIPOLAR (ELECTRODE) ×2 IMPLANT
COVER MAYO STAND STRL (DRAPES) ×2 IMPLANT
COVER TABLE BACK 60X90 (DRAPES) ×2 IMPLANT
CUFF TOURNIQUET SINGLE 18IN (TOURNIQUET CUFF) ×1 IMPLANT
CUFF TOURNIQUET SINGLE 24IN (TOURNIQUET CUFF) IMPLANT
DRAPE C-ARM 42X72 X-RAY (DRAPES) ×2 IMPLANT
DRAPE EXTREMITY T 121X128X90 (DRAPE) ×2 IMPLANT
DRAPE SURG 17X23 STRL (DRAPES) ×2 IMPLANT
DRILL SOLID 2.0X40MM (BIT) ×2
DRILL SOLID 2.5X40MM (BIT) ×2
DRSG ADAPTIC 3X8 NADH LF (GAUZE/BANDAGES/DRESSINGS) ×2 IMPLANT
DRSG EMULSION OIL 3X3 NADH (GAUZE/BANDAGES/DRESSINGS) IMPLANT
ELECT REM PT RETURN 9FT ADLT (ELECTROSURGICAL) ×2
ELECTRODE REM PT RTRN 9FT ADLT (ELECTROSURGICAL) ×1 IMPLANT
GLOVE BIO SURGEON STRL SZ7.5 (GLOVE) ×2 IMPLANT
GLOVE BIOGEL PI IND STRL 7.0 (GLOVE) IMPLANT
GLOVE BIOGEL PI IND STRL 8 (GLOVE) ×1 IMPLANT
GLOVE BIOGEL PI INDICATOR 7.0 (GLOVE) ×1
GLOVE BIOGEL PI INDICATOR 8 (GLOVE) ×1
GLOVE ECLIPSE 6.5 STRL STRAW (GLOVE) ×1 IMPLANT
GOWN PREVENTION PLUS XLARGE (GOWN DISPOSABLE) ×3 IMPLANT
GUIDE AIMING 1.5MM (WIRE) ×1 IMPLANT
NDL HYPO 25X1 1.5 SAFETY (NEEDLE) IMPLANT
NDL KEITH (NEEDLE) IMPLANT
NEEDLE HYPO 25X1 1.5 SAFETY (NEEDLE) IMPLANT
NEEDLE KEITH (NEEDLE) ×2 IMPLANT
NS IRRIG 1000ML POUR BTL (IV SOLUTION) ×2 IMPLANT
PACK BASIN DAY SURGERY FS (CUSTOM PROCEDURE TRAY) ×2 IMPLANT
PAD CAST 4YDX4 CTTN HI CHSV (CAST SUPPLIES) ×1 IMPLANT
PADDING CAST ABS 4INX4YD NS (CAST SUPPLIES)
PADDING CAST ABS COTTON 4X4 ST (CAST SUPPLIES) IMPLANT
PADDING CAST COTTON 4X4 STRL (CAST SUPPLIES) ×2
PENCIL BUTTON HOLSTER BLD 10FT (ELECTRODE) ×2 IMPLANT
RUBBERBAND STERILE (MISCELLANEOUS) IMPLANT
SKELETAL DYNAMICS DVR SET (Set) ×1 IMPLANT
SLEEVE SCD COMPRESS KNEE MED (MISCELLANEOUS) ×2 IMPLANT
SPONGE GAUZE 4X4 12PLY (GAUZE/BANDAGES/DRESSINGS) ×2 IMPLANT
STOCKINETTE 4X48 STRL (DRAPES) ×2 IMPLANT
SUT VIC AB 2-0 CT3 27 (SUTURE) ×2 IMPLANT
SUT VICRYL 4-0 PS2 18IN ABS (SUTURE) IMPLANT
SUT VICRYL RAPIDE 4/0 PS 2 (SUTURE) ×2 IMPLANT
SYR BULB 3OZ (MISCELLANEOUS) ×2 IMPLANT
SYRINGE 10CC LL (SYRINGE) IMPLANT
TOWEL OR 17X24 6PK STRL BLUE (TOWEL DISPOSABLE) ×2 IMPLANT
TOWEL OR NON WOVEN STRL DISP B (DISPOSABLE) ×2 IMPLANT
UNDERPAD 30X30 INCONTINENT (UNDERPADS AND DIAPERS) ×2 IMPLANT
WIRE FIX 1.5 STANDARD TIP (WIRE) ×4
WIRE FIX 1.5 STD TIP (WIRE) IMPLANT

## 2013-04-18 NOTE — Op Note (Signed)
04/18/2013  12:42 PM  PATIENT:  Virginia Burns  67 y.o. female  PRE-OPERATIVE DIAGNOSIS:  Displaced left distal radius intra-articular fracture with ulnar-sided wrist wound  POST-OPERATIVE DIAGNOSIS:  Same, with TFC tear  PROCEDURE:  ORIF left comminuted distal radius fracture 25609 & open repair of Left wrist TFC                             Debridement of left wrist ulnar wound (S,SQ) & closure of 3cm wound  SURGEON: Cliffton Asters. Janee Morn, MD  PHYSICIAN ASSISTANT: None  ANESTHESIA:  general  SPECIMENS:  None  DRAINS:   None  PREOPERATIVE INDICATIONS:  Virginia Burns is a  67 y.o. female with a diagnosis of a displaced comminuted intra-articular distal radius fracture with an accompanying ulnar sided wrist wound.  The risks benefits and alternatives were discussed with the patient preoperatively including but not limited to the risks of infection, bleeding, nerve injury, cardiopulmonary complications, the need for revision surgery, among others, and the patient verbalized understanding and consented to proceed.  OPERATIVE IMPLANTS: [employer] narrow standard distal radius plate  OPERATIVE PROCEDURE:  After receiving prophylactic antibiotics, the patient was escorted to the operative theatre and placed in a supine position.  General anesthesia was administered.  A surgical "time-out" was performed during which the planned procedure, proposed operative site, and the correct patient identity were compared to the operative consent and agreement confirmed by the circulating nurse according to current facility policy.  Following application of a tourniquet to the operative extremity, the exposed skin was pre-scrubbed with a Hibiclens scrub brush before being formally prepped with Chloraprep and draped in the usual sterile fashion.  The limb was exsanguinated with an Esmarch bandage and the tourniquet inflated to approximately higher than systolic BP.  The ulnar-sided wound  was extended proximally and distally for a couple centimeters and then the wound edges were debrided away. The dorsal portion of the wound was a partial-thickness abrasion and much of the dermis appeared viable it was. The wound is copiously irrigated. There was a fracture through the base of the ulnar styloid which was retracted. The TFC was avulsed off the distal ulna. All this was copiously irrigated before attention was directed to the distal radius fracture. A curvilinear incision was made in line with the FCR axis. The FCR axis was exploited deeply. The pronator quadratus was somewhat shredded but was reflected in an L-shaped as well as could be done. There was extrusion of some of the volar cortical bone into the pronator. Once pronator was reflected the brachial radialis was split in a Z-plasty fashion for later reapproximation. The fracture was provisionally reduced and volar plate applied. Narrow plate was selected and was applied the volar surface of the bone with its proper location confirmed fluoroscopically and secured with a screw through the slotted hole. Its position was adjusted slightly. The distal radius was reduced manually and a couple of the ulnar holes drilled and filled with smooth pegs. Once the ulnar side was reduced in this fashion the radial side was reduced to it with care to retrieve the appropriate tilt and inclination. The radial side was secured to the plate with smooth pegs. Care was taken to select pegs on the shorter side measurements to help prevent dorsal cortical penetration minimized risk for dorsal to a rupture. The proximal holes were then sequentially drilled and filled with locking screws. The DRUJ was assessed and  found to be a little sloppy particularly inflammation. On the ulnar side, TFC was repaired by passing 20 FiberWire through the TFC in horizontal mattress fashion bringing it through drill holes at the distal ulna, and the radial side of the base of the styloid  fracture. In neutral, TFC was secured to the distal ulna and the tails of the  FiberWire were tied over bone bridge on the ulnar side of the ulna. DRUJ stability had been improved.  The remainder the soft tissues about the wrist limiting on the were repaired with 2-0 Vicryl interrupted sutures. All wounds again copiously irrigated.  The brachial radialis was repaired with 2-0 Vicryl suture followed by repair of the pronator quadratus in a similar fashion. This covered the plate, at least the distal half. Tourniquet was released and additional hemostasis obtained and the skin on both sides were closed with 4-0 Vicryl Rapide running horizontal mattress suture. Half percent Marcaine with epinephrine was instilled around the wound edges for postoperative pain control. Adaptic was placed over the surgical wounds as well as the forearm skin tears and then a sugar tong splint dressing was applied. She was awakened and taken to the recovery room stable condition breathing spontaneously  DISPOSITION: She'll be discharged home today with typical instructions returning in 10-15 days for wound assessment, x-rays of the left wrist in the splint to include an incline lateral, and follow-on appointment with hand therapy to have a long-arm splint constructed. We will start forearm rotation at approximately 4 weeks.

## 2013-04-18 NOTE — Interval H&P Note (Signed)
History and Physical Interval Note:  04/18/2013 12:11 PM  Virginia Burns  has presented today for surgery, with the diagnosis of LEFT DISTAL WRIST FRACTURE  The various methods of treatment have been discussed with the patient and family. After consideration of risks, benefits and other options for treatment, the patient has consented to  Procedure(s): LEFT OPEN REDUCTION INTERNAL FIXATION (ORIF) DISTAL RADIAL FRACTURE (Left) as a surgical intervention .  The patient's history has been reviewed, patient examined, no change in status, stable for surgery.  I have reviewed the patient's chart and labs.  Questions were answered to the patient's satisfaction.     Lawyer Washabaugh A.

## 2013-04-18 NOTE — H&P (View-Only) (Signed)
ORTHOPAEDIC CONSULTATION HISTORY & PHYSICAL REQUESTING PHYSICIAN: Willa Brocks A Gudrun Axe, MD  Chief Complaint: Left distal radius and ulna fracture  HPI: Virginia Burns is a 67 y.o. female who fell backwards on outstretched hands, sustaining injury to left wrist. She denies loss of consciousness or preceding dizziness or lightheadedness. A provisional splint has been applied. She has received Kefzol, and last tetanus was reported as  "3 years at the most."  Past Medical History  Diagnosis Date  . Personal history of colonic polyps 10/07/2009    tubular adenoma  . Vitamin B12 deficiency   . Gastroparesis   . Diverticulosis of colon (without mention of hemorrhage)   . Family history of malignant neoplasm of gastrointestinal tract   . Osteoporosis   . Hypertension   . Other esophagitis   . COPD (chronic obstructive pulmonary disease)    Past Surgical History  Procedure Laterality Date  . Cholecystectomy    . Abdominal hysterectomy     History   Social History  . Marital Status: Single    Spouse Name: N/A    Number of Children: 1  . Years of Education: N/A   Occupational History  . Realtor    Social History Main Topics  . Smoking status: Former Smoker -- 1.00 packs/day for 25 years    Types: Cigarettes    Quit date: 05/18/2010  . Smokeless tobacco: Never Used  . Alcohol Use: Yes     Comment: 2-3 every night 3 x week  . Drug Use: No  . Sexual Activity: None   Other Topics Concern  . None   Social History Narrative  . None   Family History  Problem Relation Age of Onset  . COPD Mother   . Colon cancer Cousin   . Diabetes Brother   . Colitis Son    Allergies  Allergen Reactions  . Clindamycin/Lincomycin Hives   Prior to Admission medications   Medication Sig Start Date End Date Taking? Authorizing Provider  albuterol (PROVENTIL HFA;VENTOLIN HFA) 108 (90 BASE) MCG/ACT inhaler Inhale 2 puffs into the lungs every 4 (four) hours as needed.   Yes Historical  Provider, MD  amLODipine (NORVASC) 5 MG tablet Take 7.5 mg by mouth daily.    Yes Historical Provider, MD  aspirin 81 MG tablet Take 81 mg by mouth daily.   Yes Historical Provider, MD  budesonide-formoterol (SYMBICORT) 160-4.5 MCG/ACT inhaler Inhale 2 puffs into the lungs 2 (two) times daily. 03/09/13  Yes Vineet Sood, MD  Calcium Carbonate-Vit D-Min (CALCIUM-VITAMIN D-MINERALS) 600-400 MG-UNIT CHEW Chew 1 tablet by mouth daily.   Yes Historical Provider, MD  cholecalciferol (VITAMIN D) 1000 UNITS tablet Take 1,000 Units by mouth daily.   Yes Historical Provider, MD  Domperidone BP POWD TAKE ONE CAPSULE BY MOUTH THREE TIMES A DAY BEFORE MEALS 02/02/12  Yes Kaleab Frasier R Patterson, MD  guaiFENesin (MUCINEX) 600 MG 12 hr tablet Take 1 tablet (600 mg total) by mouth 2 (two) times daily. For 5 days 12/31/12  Yes Preetha Joseph, MD  LORazepam (ATIVAN) 0.5 MG tablet Take 0.5 mg by mouth as needed for anxiety.    Yes Historical Provider, MD  Magnesium 250 MG TABS Take 1 tablet by mouth daily.   Yes Historical Provider, MD  methocarbamol (ROBAXIN) 750 MG tablet Take 750 mg by mouth at bedtime as needed (muscle cramps).   Yes Historical Provider, MD  Multiple Vitamin (MULTIVITAMIN WITH MINERALS) TABS tablet Take 1 tablet by mouth daily.   Yes Historical Provider, MD    pantoprazole (PROTONIX) 40 MG tablet Take 40 mg by mouth 2 (two) times daily.    Yes Historical Provider, MD  polyethylene glycol (MIRALAX / GLYCOLAX) packet Take 17 g by mouth 2 (two) times daily as needed. 12/31/12  Yes Preetha Joseph, MD  Probiotic Product (VSL#3) CAPS Take 1 capsule by mouth daily. 04/11/13  Yes Meriah Shands R Patterson, MD  tiotropium (SPIRIVA) 18 MCG inhalation capsule Place 1 capsule (18 mcg total) into inhaler and inhale daily. 03/09/13  Yes Vineet Sood, MD  vitamin C (ASCORBIC ACID) 500 MG tablet Take 500 mg by mouth daily.   Yes Historical Provider, MD  vitamin E 400 UNIT capsule Take 400 Units by mouth daily.   Yes Historical  Provider, MD   Dg Wrist Complete Left  04/17/2013   CLINICAL DATA:  Status post fall with wrist pain and deformity  EXAM: LEFT WRIST - COMPLETE 3+ VIEW  COMPARISON:  None.  FINDINGS: There is a comminuted intra-articular impacted fracture of the distal radius. There is fracture of the ulna styloid. There is dislocation of the distal ulna and radial shaft relative to the carpal bones.  IMPRESSION: Fracture/dislocation of the distal radius and ulna.   Electronically Signed   By: Wei-Chen  Lin M.D.   On: 04/17/2013 14:26    Positive ROS: All other systems have been reviewed and were otherwise negative with the exception of those mentioned in the HPI and as above.  Physical Exam: Vitals: Refer to EMR. Constitutional:  WD, WN, NAD HEENT:  NCAT, EOMI Neuro/Psych:  Alert & oriented to person, place, and time; appropriate mood & affect Lymphatic: No generalized UE edema or lymphadenopathy Extremities / MSK:  The extremities are normal with respect to appearance, ranges of motion, joint stability, muscle strength/tone, sensation, & perfusion except as otherwise noted:   2cm skin tear at ulnar wrist, distal to distal ulna.  2  More proximal skin tears about 1-1.5 cm each.  Obvious fx of distal radius, with dorsal and radial displacement.  + LT sens R/M/U and intact motor to same.  Fingers warm, brisk CR. Compartments soft.  Assessment: Comminuted left distal radius fracture with dorsal and radial displacement, ulnar side open wound without bone visible or palpable  Plan: Hematoma block administered with 1% lidocaine with epinephrine. There was no egress of fluid out the ulnar side open wound with administration of a hematoma block. Proximally open wound, field block was administered with the same anesthetic. The open wound is copiously irrigated under running water in the sink and Xeroform applied to all the skin tags and wrapped with Coban. Manipulative reduction performed, fracture grossly normally  aligned. Sugar tong splint applied. No change in post reduction neurovascular exam. This patient has already been scheduled for surgical treatment at [facility] tomorrow.  Instructions given. Swelling and neurovascular precautions also reviewed as well as the need to elevate the digits. She prefers hydrocodone for pain.  Augmentin for 5 days.  Marijane Trower A. Miche Loughridge, MD     Mobile [phone] Orthopaedic & Hand Surgery [facility] [address] [place], [place]  [address] [phone]   

## 2013-04-18 NOTE — Anesthesia Preprocedure Evaluation (Addendum)
Anesthesia Evaluation  Patient identified by MRN, date of birth, ID band Patient awake    Reviewed: Allergy & Precautions, H&P , NPO status , Patient's Chart, lab work & pertinent test results  Airway Mallampati: II TM Distance: >3 FB Neck ROM: Full    Dental no notable dental hx. (+) Edentulous Upper, Upper Dentures and Dental Advisory Given   Pulmonary neg pulmonary ROS, COPD COPD inhaler, former smoker,  breath sounds clear to auscultation  Pulmonary exam normal       Cardiovascular hypertension, On Medications negative cardio ROS  Rhythm:Regular Rate:Normal     Neuro/Psych negative neurological ROS  negative psych ROS   GI/Hepatic Neg liver ROS, GERD-  Medicated and Poorly Controlled,Gastroparesis   Endo/Other  negative endocrine ROS  Renal/GU negative Renal ROS  negative genitourinary   Musculoskeletal   Abdominal   Peds  Hematology negative hematology ROS (+)   Anesthesia Other Findings   Reproductive/Obstetrics negative OB ROS                         Anesthesia Physical Anesthesia Plan  ASA: III  Anesthesia Plan: General   Post-op Pain Management:    Induction: Intravenous, Rapid sequence and Cricoid pressure planned  Airway Management Planned: Oral ETT  Additional Equipment:   Intra-op Plan:   Post-operative Plan: Extubation in OR  Informed Consent: I have reviewed the patients History and Physical, chart, labs and discussed the procedure including the risks, benefits and alternatives for the proposed anesthesia with the patient or authorized representative who has indicated his/her understanding and acceptance.   Dental advisory given  Plan Discussed with: CRNA and Surgeon  Anesthesia Plan Comments: (Pt. declines nerve block)       Anesthesia Quick Evaluation

## 2013-04-18 NOTE — Anesthesia Procedure Notes (Signed)
Procedure Name: Intubation Performed by: Lance Coon Pre-anesthesia Checklist: Patient identified, Emergency Drugs available, Suction available and Patient being monitored Patient Re-evaluated:Patient Re-evaluated prior to inductionOxygen Delivery Method: Circle System Utilized Preoxygenation: Pre-oxygenation with 100% oxygen Intubation Type: IV induction Ventilation: Mask ventilation without difficulty Laryngoscope Size: Mac and 3 Grade View: Grade I Tube type: Oral Tube size: 7.0 mm Number of attempts: 1 Airway Equipment and Method: stylet,  oral airway and Stylet Placement Confirmation: ETT inserted through vocal cords under direct vision,  positive ETCO2 and breath sounds checked- equal and bilateral Tube secured with: Tape Dental Injury: Teeth and Oropharynx as per pre-operative assessment

## 2013-04-18 NOTE — Transfer of Care (Signed)
Immediate Anesthesia Transfer of Care Note  Patient: Virginia Burns  Procedure(s) Performed: Procedure(s): LEFT OPEN REDUCTION INTERNAL FIXATION (ORIF) DISTAL RADIAL FRACTURE (Left) Triangular Fibrocartilage complex open repair (Left)  Patient Location: PACU  Anesthesia Type:General  Level of Consciousness: awake and patient cooperative  Airway & Oxygen Therapy: Patient Spontanous Breathing and Patient connected to face mask oxygen  Post-op Assessment: Report given to PACU RN and Post -op Vital signs reviewed and stable  Post vital signs: Reviewed and stable  Complications: No apparent anesthesia complications

## 2013-04-19 ENCOUNTER — Encounter (HOSPITAL_BASED_OUTPATIENT_CLINIC_OR_DEPARTMENT_OTHER): Payer: Self-pay | Admitting: Orthopedic Surgery

## 2013-04-25 NOTE — Anesthesia Postprocedure Evaluation (Signed)
  Anesthesia Post-op Note  Patient: Virginia Burns  Procedure(s) Performed: Procedure(s): LEFT OPEN REDUCTION INTERNAL FIXATION (ORIF) DISTAL RADIAL FRACTURE (Left) Triangular Fibrocartilage complex open repair (Left)  Patient Location: PACU  Anesthesia Type:General  Level of Consciousness: awake  Airway and Oxygen Therapy: Patient Spontanous Breathing  Post-op Pain: mild  Post-op Assessment: Post-op Vital signs reviewed  Post-op Vital Signs: stable  Complications: No apparent anesthesia complications

## 2013-04-25 NOTE — Anesthesia Postprocedure Evaluation (Signed)
  Anesthesia Post-op Note  Patient: Virginia Burns  Procedure(s) Performed: Procedure(s): LEFT OPEN REDUCTION INTERNAL FIXATION (ORIF) DISTAL RADIAL FRACTURE (Left) Triangular Fibrocartilage complex open repair (Left)  Patient Location: PACU  Anesthesia Type:General  Level of Consciousness: awake  Airway and Oxygen Therapy: Patient Spontanous Breathing  Post-op Pain: mild  Post-op Assessment: Post-op Vital signs reviewed  Post-op Vital Signs: stable  Complications: No apparent anesthesia complications 

## 2013-05-10 ENCOUNTER — Ambulatory Visit: Payer: Medicare Other | Admitting: Neurology

## 2013-05-22 ENCOUNTER — Ambulatory Visit (INDEPENDENT_AMBULATORY_CARE_PROVIDER_SITE_OTHER): Payer: Medicare Other | Admitting: Neurology

## 2013-05-22 ENCOUNTER — Encounter: Payer: Self-pay | Admitting: Neurology

## 2013-05-22 VITALS — BP 140/80 | HR 70 | Temp 98.0°F | Ht 68.0 in | Wt 203.0 lb

## 2013-05-22 DIAGNOSIS — G250 Essential tremor: Secondary | ICD-10-CM

## 2013-05-22 DIAGNOSIS — G252 Other specified forms of tremor: Secondary | ICD-10-CM

## 2013-05-22 HISTORY — DX: Essential tremor: G25.0

## 2013-05-22 NOTE — Progress Notes (Signed)
[facility] Clinic Note - Initial Visit   Date: 05/22/2013    Virginia Burns MRN: [id number] DOB: [birth year]   Dear Dr Sharlett Iles:  Thank you for your kind referral of Virginia Burns for consultation of tremors. Although her history is well known to you, please allow [place] to reiterate it for the purpose of our medical record. The patient was accompanied to the clinic by self.   History of Present Illness: Virginia Burns is a 68 y.o. year-old right-handed [place] female with history of idiopathic gastroparesis, hypertension, COPD, and OA presenting for evaluation of bilateral hand tremors.   She reports always having the "shakes a little", but it has worsened over the past 66-months. She was diagnosed with gastroparesis about 20 years ago and started on reglan around early 2000 which seemed to control her symptoms.  She reports possibly having mild tremors even before starting reglan, but it was very infrequent. Within a few years of taking reglan, she noticed mild intermittent tremors of the hands.  She was switched to domperidone due to safer side effect profile of reglan around 2011.  There was no change in her tremors after switching to domperidone.  No recent changes in her dosage.  He tremor is fairly constant, involving both hands, and worse with action.  There is no tremor at rest.  It is worse with stress and seems to be improved when calm. Alcohol may also reduce the tremor.  She has to concentrate with fine motor movements such as putting on make-up and can be socially embarrassing at times.   Denies any muscle stiffness, vivid dreams, anosmia, or frequent falls.  She does report having a severe fall in December after tripping over the edge of a coffee table and fractured left wrist and arm.  She underwent surgery on 12/2 and currently has it immobilized.  She says that her tremors have improved since she is not as active since her fall.  Of note,  her mother and maternal aunt had tremors too.  Out-side paper records, electronic medical record, and images have been reviewed where available and summarized as:  Lab Results  Component Value Date   TSH 2.058 12/27/2012    Past Medical History  Diagnosis Date  . Personal history of colonic polyps 10/07/2009    tubular adenoma  . Vitamin B12 deficiency   . Gastroparesis   . Diverticulosis of colon (without mention of hemorrhage)   . Family history of malignant neoplasm of gastrointestinal tract   . Osteoporosis   . Hypertension   . Other esophagitis   . COPD (chronic obstructive pulmonary disease)     Past Surgical History  Procedure Laterality Date  . Cholecystectomy    . Abdominal hysterectomy    . Open reduction internal fixation (orif) distal radial fracture Left 04/18/2013    Procedure: LEFT OPEN REDUCTION INTERNAL FIXATION (ORIF) DISTAL RADIAL FRACTURE;  Surgeon: Jolyn Nap, MD;  Location: [facility];  Service: Orthopedics;  Laterality: Left;  . Ligament repair Left 04/18/2013    Procedure: Triangular Fibrocartilage complex open repair;  Surgeon: Jolyn Nap, MD;  Location: [facility];  Service: Orthopedics;  Laterality: Left;     Medications:  Current Outpatient Prescriptions on File Prior to Visit  Medication Sig Dispense Refill  . albuterol (PROVENTIL HFA;VENTOLIN HFA) 108 (90 BASE) MCG/ACT inhaler Inhale 2 puffs into the lungs every 4 (four) hours as needed.      Marland Bradstreet amLODipine (NORVASC) 5  MG tablet Take 7.5 mg by mouth daily.       Marland Feil amoxicillin-clavulanate (AUGMENTIN) 875-125 MG per tablet Take 1 tablet by mouth 2 (two) times daily.  20 tablet  0  . aspirin 81 MG tablet Take 81 mg by mouth daily.      . budesonide-formoterol (SYMBICORT) 160-4.5 MCG/ACT inhaler Inhale 2 puffs into the lungs 2 (two) times daily.  1 Inhaler  0  . Calcium Carbonate-Vit D-Min (CALCIUM-VITAMIN D-MINERALS) 600-400 MG-UNIT CHEW Chew 1 tablet by mouth  daily.      . cholecalciferol (VITAMIN D) 1000 UNITS tablet Take 1,000 Units by mouth daily.      . Domperidone BP POWD TAKE ONE CAPSULE BY MOUTH THREE TIMES A DAY BEFORE MEALS  270 g  2  . guaiFENesin (MUCINEX) 600 MG 12 hr tablet Take 1 tablet (600 mg total) by mouth 2 (two) times daily. For 5 days  10 tablet  0  . HYDROcodone-acetaminophen (NORCO) 5-325 MG per tablet Take 1-2 tablets by mouth every 6 (six) hours as needed.  80 tablet  0  . LORazepam (ATIVAN) 0.5 MG tablet Take 0.5 mg by mouth as needed for anxiety.       . Magnesium 250 MG TABS Take 1 tablet by mouth daily.      . methocarbamol (ROBAXIN) 750 MG tablet Take 750 mg by mouth at bedtime as needed (muscle cramps).      . Multiple Vitamin (MULTIVITAMIN WITH MINERALS) TABS tablet Take 1 tablet by mouth daily.      . pantoprazole (PROTONIX) 40 MG tablet Take 40 mg by mouth 2 (two) times daily.       . polyethylene glycol (MIRALAX / GLYCOLAX) packet Take 17 g by mouth 2 (two) times daily as needed.  14 each  0  . Probiotic Product (VSL#3) CAPS Take 1 capsule by mouth daily.  20 capsule  0  . tiotropium (SPIRIVA) 18 MCG inhalation capsule Place 1 capsule (18 mcg total) into inhaler and inhale daily.  20 capsule  0  . vitamin C (ASCORBIC ACID) 500 MG tablet Take 500 mg by mouth daily.      . vitamin E 400 UNIT capsule Take 400 Units by mouth daily.       No current facility-administered medications on file prior to visit.    Allergies:  Allergies  Allergen Reactions  . Clindamycin/Lincomycin Hives    Family History: Family History  Problem Relation Age of Onset  . COPD Mother   . Colon cancer Cousin   . Diabetes Brother   . Colitis Son     Social History: History   Social History  . Marital Status: Single    Spouse Name: N/A    Number of Children: 1  . Years of Education: N/A   Occupational History  . Realtor    Social History Main Topics  . Smoking status: Former Smoker -- 1.00 packs/day for 25 years     Types: Cigarettes    Quit date: 05/18/2010  . Smokeless tobacco: Never Used  . Alcohol Use: Yes     Comment: 2-3 every night 3 x week  . Drug Use: No  . Sexual Activity: Not on file   Other Topics Concern  . Not on file   Social History Narrative  . No narrative on file    Review of Systems:  CONSTITUTIONAL: No fevers, chills, night sweats, or weight loss.   EYES: No visual changes or eye pain ENT: No hearing changes.  No history of nose bleeds.   RESPIRATORY: No cough, wheezing and shortness of breath.   CARDIOVASCULAR: Negative for chest pain, and palpitations.   GI: Negative for abdominal discomfort, blood in stools or black stools.  +constiptation  GU:  No history of incontinence.   MUSCLOSKELETAL: No history of joint pain or swelling.  No myalgias.   SKIN: Negative for lesions, rash, and itching.   HEMATOLOGY/ONCOLOGY: Negative for prolonged bleeding, bruising easily, and swollen nodes.  ENDOCRINE: Negative for cold or heat intolerance, polydipsia or goiter.   PSYCH:  No depression +anxiety symptoms.   NEURO: As Above.   Vital Signs:  BP 140/80  Pulse 70  Temp(Src) 98 F (36.7 C)  Ht 5\' 8"  (1.727 m)  Wt 203 lb (92.08 kg)  BMI 30.87 kg/m2    Neurological Exam: MENTAL STATUS including orientation to time, place, person, recent and remote memory, attention span and concentration, language, and fund of knowledge is normal.  Speech is not dysarthric.  CRANIAL NERVES: II:  No visual field defects.  Unremarkable fundi.   III-IV-VI: Pupils equal round and reactive to light.  Normal conjugate, extra-ocular eye movements in all directions of gaze.  No nystagmus.    V:  Normal facial sensation.     VII:  Normal facial symmetry and movements.  Myerson's sign is present. VIII:  Normal hearing and vestibular function.   IX-X:  Normal palatal movement.   XI:  Normal shoulder shrug and head rotation.   XII:  Normal tongue strength and range of motion, no deviation or  fasciculation.  MOTOR:  No atrophy or fasciculations.  No pronator drift.  Tone is normal.  There is a low amplitude, moderate-high frequency rhythmical intention tremor of the fingers bilaterally (left > right) which is worse with action and absent at rest.  Right Upper Extremity:    Left upper extremity is antigravity proximally, testing is limited due to limb being immobilized in cast, she is able to wiggle fingers   Deltoid  5/5      Biceps  5/5      Triceps  5/5      Wrist extensors  5/5      Wrist flexors  5/5      Finger extensors  5/5      Finger flexors  5/5      Dorsal interossei  5/5      Abductor pollicis  5/5      Tone (Ashworth scale)  0      Right Lower Extremity:    Left Lower Extremity:    Hip flexors  5/5   Hip flexors  5/5   Hip extensors  5/5   Hip extensors  5/5   Knee flexors  5/5   Knee flexors  5/5   Knee extensors  5/5   Knee extensors  5/5   Dorsiflexors  5/5   Dorsiflexors  5/5   Plantarflexors  5/5   Plantarflexors  5/5   Toe extensors  5/5   Toe extensors  5/5   Toe flexors  5/5   Toe flexors  5/5   Tone (Ashworth scale)  0  Tone (Ashworth scale)  0   MSRs:  Right  Left brachioradialis 2+  brachioradialis 2+  biceps 2+  biceps 2+  triceps 2+  triceps 2+  patellar 3+  patellar 3+  ankle jerk 2+  ankle jerk 2+  Hoffman no  Hoffman no  plantar response down  plantar response down   SENSORY:  Normal and symmetric perception of light touch, pinprick, vibration, and proprioception.    COORDINATION/GAIT: Normal finger-to- nose-finger and heel-to-shin.  Intact rapid alternating movements on the right, limited testing of the left hand.  Able to rise from a chair without using arms.  Gait narrow based and stable.   IMPRESSION/PLAN: Ms. Atcheson is a 68 year-old female with severe idiopathic gastroparesis on domperidone for a number of years presenting for evaluation of bilateral hand tremors.   Her neurological examination and history is most consistent with benign essential tremors.  The incidence of extrapyrimidal side effects with domperidone is much less than with reglan and given that she does not have resting tremor, rigidity, or increased tone on exam, my suspicion for drug-induced parkinsonism is very low.  Thus, I feel that it is reasonable to keep her on her current medications.    As for treatment of benign essential tremors, I discussed that given her history of asthma and COPD, betablocker such as propranolol would not be indicated.  Primidone is a reasonable option, and that it is a medication that is titrated and tapered slowly.  She does not want to start medications at this time since symptoms are not limiting her daily activity.  Should her tremors worsen or she would like to start primidone, I would be happy to do so.  In the meantime, I would like to follow her clinically and see her again in one year, or sooner as needed.    The duration of this appointment visit was 45 minutes of face-to-face time with the patient.  Greater than 50% of this time was spent in counseling, explanation of diagnosis, planning of further management, and coordination of care.   Thank you for allowing me to participate in patient's care.  If I can answer any additional questions, I would be pleased to do so.    Sincerely,    Donika K. Posey Pronto, DO

## 2013-05-22 NOTE — Patient Instructions (Signed)
1.  You are welcome to read about primidone as treatment for benign essential tremors and let me know if you would like to try this medication.  I do not recommend propranolol because of your history of asthma and COPD. 2.  I would like to see you again in one year, or sooner as needed.

## 2013-05-29 ENCOUNTER — Encounter: Payer: Medicare Other | Admitting: Gastroenterology

## 2013-06-01 ENCOUNTER — Encounter: Payer: Self-pay | Admitting: Family Medicine

## 2013-08-17 DIAGNOSIS — F4322 Adjustment disorder with anxiety: Secondary | ICD-10-CM

## 2013-08-17 DIAGNOSIS — F411 Generalized anxiety disorder: Secondary | ICD-10-CM

## 2013-08-17 HISTORY — DX: Generalized anxiety disorder: F41.1

## 2013-08-17 HISTORY — DX: Adjustment disorder with anxiety: F43.22

## 2013-09-18 ENCOUNTER — Encounter: Payer: Self-pay | Admitting: Pulmonary Disease

## 2013-09-18 ENCOUNTER — Ambulatory Visit (INDEPENDENT_AMBULATORY_CARE_PROVIDER_SITE_OTHER): Payer: Medicare Other | Admitting: Pulmonary Disease

## 2013-09-18 VITALS — BP 140/86 | HR 98 | Ht 66.0 in | Wt 202.0 lb

## 2013-09-18 DIAGNOSIS — Z87891 Personal history of nicotine dependence: Secondary | ICD-10-CM | POA: Insufficient documentation

## 2013-09-18 DIAGNOSIS — J449 Chronic obstructive pulmonary disease, unspecified: Secondary | ICD-10-CM

## 2013-09-18 DIAGNOSIS — G4733 Obstructive sleep apnea (adult) (pediatric): Secondary | ICD-10-CM | POA: Insufficient documentation

## 2013-09-18 HISTORY — DX: Obstructive sleep apnea (adult) (pediatric): G47.33

## 2013-09-18 HISTORY — DX: Personal history of nicotine dependence: Z87.891

## 2013-09-18 NOTE — Assessment & Plan Note (Signed)
Will continue spiriva and symbicort with prn albuterol.   

## 2013-09-18 NOTE — Progress Notes (Signed)
Chief Complaint  Patient presents with  . COPD    Still having sob with exertion and some wheezing    History of Present Illness: Virginia Burns is a 68 y.o. female former smoker with COPD.  She has noticed some wheeze with exertion.  Her breathing seems more labored at night sometimes.  She snores, and wakes up feeling like she can't catch her breath.  She has been told she stops breathing while asleep.  She is not having much sputum, and denies chest pain/hemoptysis/fever.  She is sleepy during the day.  She quit smoking 3 years ago.  She started at age 34 and smoked up to 1 pack per day.  TESTS: PFT 08/09/07 >> FEV1 1.54 (63%), FEV1% 53, TLC 3.42 (65%), DLCO 64%, no BD PFT 02/10/13 >> FEV1 1.32 (52%), FEV1% 58, TLC 5.07 (96%), DLCO 60%, no BD   Virginia Burns  has a past medical history of Personal history of colonic polyps (10/07/2009); Vitamin B12 deficiency; Gastroparesis; Diverticulosis of colon (without mention of hemorrhage); Family history of malignant neoplasm of gastrointestinal tract; Osteoporosis; Hypertension; Other esophagitis; and COPD (chronic obstructive pulmonary disease).  Virginia Burns  has past surgical history that includes Cholecystectomy; Abdominal hysterectomy; Open reduction internal fixation (orif) distal radial fracture (Left, 04/18/2013); and Ligament repair (Left, 04/18/2013).  Prior to Admission medications   Medication Sig Start Date End Date Taking? Authorizing Provider  albuterol (PROVENTIL HFA;VENTOLIN HFA) 108 (90 BASE) MCG/ACT inhaler Inhale 2 puffs into the lungs every 4 (four) hours as needed.   Yes Historical Provider, MD  albuterol (PROVENTIL) (2.5 MG/3ML) 0.083% nebulizer solution Take 2.5 mg by nebulization every 6 (six) hours as needed for wheezing.   Yes Historical Provider, MD  AMBULATORY NON FORMULARY MEDICATION Domperidone 10mg  take one tablet by mouth three times a day before meals 10/27/12  Yes Sable Feil, MD  amLODipine  (NORVASC) 5 MG tablet Take 5 mg by mouth daily.   Yes Historical Provider, MD  aspirin 81 MG tablet Take 81 mg by mouth daily.   Yes Historical Provider, MD  budesonide-formoterol (SYMBICORT) 160-4.5 MCG/ACT inhaler Inhale 2 puffs into the lungs 2 (two) times daily.   Yes Historical Provider, MD  Calcium Carbonate-Vit D-Min (CALCIUM-VITAMIN D-MINERALS) 600-400 MG-UNIT CHEW Chew 1 tablet by mouth daily.   Yes Historical Provider, MD  cholecalciferol (VITAMIN D) 1000 UNITS tablet Take 1,000 Units by mouth daily.   Yes Historical Provider, MD  Cholecalciferol (VITAMIN D3) 3000 UNITS TABS Take 1 tablet by mouth daily.   Yes Historical Provider, MD  Domperidone BP POWD TAKE ONE CAPSULE BY MOUTH THREE TIMES A DAY BEFORE MEALS 02/02/12  Yes Sable Feil, MD  Flaxseed, Linseed, (FLAX SEED OIL) 1000 MG CAPS Take 1 capsule by mouth daily.   Yes Historical Provider, MD  ipratropium (ATROVENT) 0.02 % nebulizer solution Take 500 mcg by nebulization 2 (two) times daily.   Yes Historical Provider, MD  levocetirizine (XYZAL) 5 MG tablet Take 5 mg by mouth every evening.   Yes Historical Provider, MD  LORazepam (ATIVAN) 0.5 MG tablet Take 0.5 mg by mouth as needed.   Yes Historical Provider, MD  Magnesium 250 MG TABS Take 1 tablet by mouth daily.   Yes Historical Provider, MD  Multiple Vitamin (MULTIVITAMIN) capsule Take 1 capsule by mouth daily.   Yes Historical Provider, MD  pantoprazole (PROTONIX) 40 MG tablet Take 40 mg by mouth daily.   Yes Historical Provider, MD  tiotropium (SPIRIVA HANDIHALER) 18 MCG  inhalation capsule Place 18 mcg into inhaler and inhale daily.   Yes Historical Provider, MD  vitamin C (ASCORBIC ACID) 500 MG tablet Take 500 mg by mouth daily.   Yes Historical Provider, MD  vitamin E 400 UNIT capsule Take 400 Units by mouth daily.   Yes Historical Provider, MD    Allergies  Allergen Reactions  . Clindamycin/Lincomycin Hives     Physical Exam:  General - No distress ENT - No  sinus tenderness, no oral exudate, no LAN, MP 3 Cardiac - s1s2 regular, no murmur Chest - Prolonged exhalation, no wheeze Back - No focal tenderness Abd - Soft, non-tender Ext - No edema Neuro - Normal strength Skin - No rashes Psych - normal mood, and behavior   Assessment/Plan:  Chesley Mires, MD  Pulmonary/Critical Care/Sleep Pager:  [phone]

## 2013-09-18 NOTE — Assessment & Plan Note (Signed)
She quit smoking in 2012 and has 40 pack year hx of smoking.  Discussed lung cancer screening program with low dose CT chest.  She would like to wait until [facility] has program established.  Will discuss again at next [place].

## 2013-09-18 NOTE — Assessment & Plan Note (Signed)
She reports snoring, sleep disruption, apnea, and daytime sleepiness.  She has history of HTN, moderate COPD and prior sleep study showing sleep apnea.  She is willing to consider therapy for sleep apnea.  We discussed how sleep apnea can affect various health problems including risks for hypertension, cardiovascular disease, and diabetes.  We also discussed how sleep disruption can increase risks for accident, such as while driving.  Weight loss as a means of improving sleep apnea was also reviewed.  Additional treatment options discussed were CPAP therapy, oral appliance, and surgical intervention.  Will arrange for in lab study to further assess.

## 2013-09-18 NOTE — Patient Instructions (Signed)
Will schedule sleep study and call with results Follow up in 6 months

## 2013-10-24 ENCOUNTER — Ambulatory Visit (HOSPITAL_BASED_OUTPATIENT_CLINIC_OR_DEPARTMENT_OTHER): Payer: Medicare Other | Attending: Pulmonary Disease | Admitting: Radiology

## 2013-10-24 VITALS — Ht 66.0 in | Wt 200.0 lb

## 2013-10-24 DIAGNOSIS — Z79899 Other long term (current) drug therapy: Secondary | ICD-10-CM | POA: Insufficient documentation

## 2013-10-24 DIAGNOSIS — R0609 Other forms of dyspnea: Secondary | ICD-10-CM | POA: Insufficient documentation

## 2013-10-24 DIAGNOSIS — R0989 Other specified symptoms and signs involving the circulatory and respiratory systems: Secondary | ICD-10-CM | POA: Insufficient documentation

## 2013-10-24 DIAGNOSIS — I491 Atrial premature depolarization: Secondary | ICD-10-CM | POA: Insufficient documentation

## 2013-10-24 DIAGNOSIS — R0681 Apnea, not elsewhere classified: Secondary | ICD-10-CM | POA: Insufficient documentation

## 2013-10-24 DIAGNOSIS — G4733 Obstructive sleep apnea (adult) (pediatric): Secondary | ICD-10-CM | POA: Insufficient documentation

## 2013-10-24 DIAGNOSIS — Z7982 Long term (current) use of aspirin: Secondary | ICD-10-CM | POA: Insufficient documentation

## 2013-10-24 DIAGNOSIS — G4720 Circadian rhythm sleep disorder, unspecified type: Secondary | ICD-10-CM | POA: Insufficient documentation

## 2013-10-30 ENCOUNTER — Telehealth: Payer: Self-pay | Admitting: Pulmonary Disease

## 2013-10-30 DIAGNOSIS — G4733 Obstructive sleep apnea (adult) (pediatric): Secondary | ICD-10-CM

## 2013-10-30 NOTE — Telephone Encounter (Signed)
PSG 10/24/13 >> AHI 6.8, SaO2 low 84%, Spent 28.3 min with SaO2 < 90%.  Will have my nurse schedule ROV to review results.

## 2013-10-30 NOTE — Sleep Study (Signed)
Virginia Burns  NAME: Virginia Burns DATE OF BIRTH:  Feb 23, 1946 MEDICAL RECORD NUMBER [id number]  LOCATION: [facility]  PHYSICIAN: Chesley Mires, M.D. DATE OF STUDY: 10/24/2013  SLEEP STUDY TYPE: Polysomnogram               REFERRING PHYSICIAN: Chesley Mires, MD  INDICATION FOR STUDY:  Virginia Burns is a 68 y.o. female who presents to the sleep lab for evaluation of hypersomnia with obstructive sleep apnea.  She reports snoring, sleep disruption, apnea, and daytime sleepiness.  EPWORTH SLEEPINESS SCORE: 4. HEIGHT: 5\' 6"  (167.6 cm)  WEIGHT: 200 lb (90.719 kg)    Body mass index is 32.3 kg/(m^2).  NECK SIZE: 14 in.  MEDICATIONS:  Current Outpatient Prescriptions on File Prior to Visit  Medication Sig Dispense Refill  . albuterol (PROVENTIL HFA;VENTOLIN HFA) 108 (90 BASE) MCG/ACT inhaler Inhale 2 puffs into the lungs every 4 (four) hours as needed.      Virginia Burns amLODipine (NORVASC) 5 MG tablet Take 7.5 mg by mouth daily.       Virginia Burns aspirin 81 MG tablet Take 81 mg by mouth daily.      . budesonide-formoterol (SYMBICORT) 160-4.5 MCG/ACT inhaler Inhale 2 puffs into the lungs 2 (two) times daily.  1 Inhaler  0  . Calcium Carbonate-Vit D-Min (CALCIUM-VITAMIN D-MINERALS) 600-400 MG-UNIT CHEW Chew 1 tablet by mouth daily.      . cholecalciferol (VITAMIN D) 1000 UNITS tablet Take 1,000 Units by mouth daily.      . Domperidone BP POWD TAKE ONE CAPSULE BY MOUTH THREE TIMES A DAY BEFORE MEALS  270 g  2  . guaiFENesin (MUCINEX) 600 MG 12 hr tablet Take 1 tablet (600 mg total) by mouth 2 (two) times daily. For 5 days  10 tablet  0  . LORazepam (ATIVAN) 0.5 MG tablet Take 0.5 mg by mouth as needed for anxiety.       . Multiple Vitamin (MULTIVITAMIN WITH MINERALS) TABS tablet Take 1 tablet by mouth daily.      . pantoprazole (PROTONIX) 40 MG tablet Take 40 mg by mouth 2 (two) times daily.       . polyethylene glycol (MIRALAX / GLYCOLAX) packet Take 17 g by mouth 2  (two) times daily as needed.  14 each  0  . Probiotic Product (VSL#3) CAPS Take 1 capsule by mouth daily.  20 capsule  0  . tiotropium (SPIRIVA) 18 MCG inhalation capsule Place 1 capsule (18 mcg total) into inhaler and inhale daily.  20 capsule  0  . vitamin C (ASCORBIC ACID) 500 MG tablet Take 500 mg by mouth daily.      . vitamin E 400 UNIT capsule Take 400 Units by mouth daily.       No current facility-administered medications on file prior to visit.    SLEEP ARCHITECTURE:  Total recording time: 358 minutes.  Total sleep time was: 257 minutes.  Sleep efficiency: 71.8%.  Sleep latency: 53.5 minutes.  REM latency: 82 minutes.  Stage N1: 12.1%.  Stage N2: 63.4%.  Stage N3: 0%.  Stage R:  24.5%.  Supine sleep: 146 minutes.  Non-supine sleep: 111 minutes.  CARDIAC DATA:  Average heart rate: 72 beats per minute. Rhythm strip: sinus rhythm with PACs.  RESPIRATORY DATA: Average respiratory rate: 17. Snoring: moderate. Average AHI: 6.8.   Apnea index: 1.9.  Hypopnea index: 4.9. Obstructive apnea index: 1.6.  Central apnea index: 0.2.  Mixed apnea index: 0. REM AHI:  22.9.  NREM AHI: 1.5. Supine AHI: 1.4. Non-supine AHI: 10.  MOVEMENT/PARASOMNIA:  Periodic limb movement: 0.  Period limb movements with arousals: 0. Restroom trips: one.  OXYGEN DATA:  Baseline oxygenation: 90%. Lowest SaO2: 84%. Time spent below SaO2 90%: 283. minutes. Supplemental oxygen used: none.  IMPRESSION/ RECOMMENDATION:   This study shows mild obstructive sleep apnea with an AHI of 6.8 and SaO2 low of 84%.  She had oxygen desaturation in the absence of other respiratory events.  This is suggestive of sleep related hypoventilation.   Additional therapies include weight loss, CPAP, oral appliance, or surgical evaluation.   Chesley Mires, M.D. Diplomate, [employer] adviser of Sleep Medicine  ELECTRONICALLY SIGNED ON:  10/30/2013, 2:10 PM [place] PH: [phone]   FX: [phone] [place]

## 2013-10-31 NOTE — Telephone Encounter (Signed)
Spoke with the pt and scheduled appt for tomorrow at 3:00 pm with Dr Halford Chessman

## 2013-11-01 ENCOUNTER — Ambulatory Visit (INDEPENDENT_AMBULATORY_CARE_PROVIDER_SITE_OTHER): Payer: Medicare Other | Admitting: Pulmonary Disease

## 2013-11-01 ENCOUNTER — Encounter: Payer: Self-pay | Admitting: Pulmonary Disease

## 2013-11-01 VITALS — BP 132/82 | HR 81 | Ht 66.0 in | Wt 202.6 lb

## 2013-11-01 DIAGNOSIS — G4733 Obstructive sleep apnea (adult) (pediatric): Secondary | ICD-10-CM

## 2013-11-01 DIAGNOSIS — J449 Chronic obstructive pulmonary disease, unspecified: Secondary | ICD-10-CM

## 2013-11-01 NOTE — Progress Notes (Signed)
Chief Complaint  Patient presents with  . Follow-up    Pt states that breathing is okay--hot weather causes some inc SOB. Review sleep study.    History of Present Illness: Virginia Burns is a 68 y.o. female former smoker with COPD.  She is here to review her sleep study.  This showed mild sleep apnea.  She is not sure she could use CPAP.  She has trouble with her breathing in hot weather.  Otherwise her breathing has been stable.  TESTS: PFT 08/09/07 >> FEV1 1.54 (63%), FEV1% 53, TLC 3.42 (65%), DLCO 64%, no BD PFT 02/10/13 >> FEV1 1.32 (52%), FEV1% 58, TLC 5.07 (96%), DLCO 60%, no BD PSG 10/24/13 >> AHI 6.8, SaO2 low 84%, Spent 28.3 min with SaO2 < 90%.   Virginia Burns  has a past medical history of Personal history of colonic polyps (10/07/2009); Vitamin B12 deficiency; Gastroparesis; Diverticulosis of colon (without mention of hemorrhage); Family history of malignant neoplasm of gastrointestinal tract; Osteoporosis; Hypertension; Other esophagitis; and COPD (chronic obstructive pulmonary disease).  Virginia Burns  has past surgical history that includes Cholecystectomy; Abdominal hysterectomy; Open reduction internal fixation (orif) distal radial fracture (Left, 04/18/2013); and Ligament repair (Left, 04/18/2013).  Prior to Admission medications   Medication Sig Start Date End Date Taking? Authorizing Provider  albuterol (PROVENTIL HFA;VENTOLIN HFA) 108 (90 BASE) MCG/ACT inhaler Inhale 2 puffs into the lungs every 4 (four) hours as needed.   Yes Historical Provider, MD  albuterol (PROVENTIL) (2.5 MG/3ML) 0.083% nebulizer solution Take 2.5 mg by nebulization every 6 (six) hours as needed for wheezing.   Yes Historical Provider, MD  AMBULATORY NON FORMULARY MEDICATION Domperidone 10mg  take one tablet by mouth three times a day before meals 10/27/12  Yes Sable Feil, MD  amLODipine (NORVASC) 5 MG tablet Take 5 mg by mouth daily.   Yes Historical Provider, MD  aspirin 81 MG  tablet Take 81 mg by mouth daily.   Yes Historical Provider, MD  budesonide-formoterol (SYMBICORT) 160-4.5 MCG/ACT inhaler Inhale 2 puffs into the lungs 2 (two) times daily.   Yes Historical Provider, MD  Calcium Carbonate-Vit D-Min (CALCIUM-VITAMIN D-MINERALS) 600-400 MG-UNIT CHEW Chew 1 tablet by mouth daily.   Yes Historical Provider, MD  cholecalciferol (VITAMIN D) 1000 UNITS tablet Take 1,000 Units by mouth daily.   Yes Historical Provider, MD  Cholecalciferol (VITAMIN D3) 3000 UNITS TABS Take 1 tablet by mouth daily.   Yes Historical Provider, MD  Domperidone BP POWD TAKE ONE CAPSULE BY MOUTH THREE TIMES A DAY BEFORE MEALS 02/02/12  Yes Sable Feil, MD  Flaxseed, Linseed, (FLAX SEED OIL) 1000 MG CAPS Take 1 capsule by mouth daily.   Yes Historical Provider, MD  ipratropium (ATROVENT) 0.02 % nebulizer solution Take 500 mcg by nebulization 2 (two) times daily.   Yes Historical Provider, MD  levocetirizine (XYZAL) 5 MG tablet Take 5 mg by mouth every evening.   Yes Historical Provider, MD  LORazepam (ATIVAN) 0.5 MG tablet Take 0.5 mg by mouth as needed.   Yes Historical Provider, MD  Magnesium 250 MG TABS Take 1 tablet by mouth daily.   Yes Historical Provider, MD  Multiple Vitamin (MULTIVITAMIN) capsule Take 1 capsule by mouth daily.   Yes Historical Provider, MD  pantoprazole (PROTONIX) 40 MG tablet Take 40 mg by mouth daily.   Yes Historical Provider, MD  tiotropium (SPIRIVA HANDIHALER) 18 MCG inhalation capsule Place 18 mcg into inhaler and inhale daily.   Yes Historical Provider, MD  vitamin C (ASCORBIC ACID) 500 MG tablet Take 500 mg by mouth daily.   Yes Historical Provider, MD  vitamin E 400 UNIT capsule Take 400 Units by mouth daily.   Yes Historical Provider, MD    Allergies  Allergen Reactions  . Clindamycin/Lincomycin Hives     Physical Exam:  General - No distress ENT - No sinus tenderness, no oral exudate, no LAN, MP 3, wears upper dentures Cardiac - s1s2 regular, no  murmur Chest - Prolonged exhalation, no wheeze Back - No focal tenderness Abd - Soft, non-tender Ext - No edema Neuro - Normal strength Skin - No rashes Psych - normal mood, and behavior   Assessment/Plan:  Chesley Mires, MD  Pulmonary/Critical Care/Sleep Pager:  [phone]

## 2013-11-01 NOTE — Assessment & Plan Note (Signed)
She has mild sleep apnea.  I have reviewed the recent sleep study results with the patient.  We discussed how sleep apnea can affect various health problems including risks for hypertension, cardiovascular disease, and diabetes.  We also discussed how sleep disruption can increase risks for accident, such as while driving.  Weight loss as a means of improving sleep apnea was also reviewed.  Additional treatment options discussed were CPAP therapy, oral appliance, and surgical intervention.  She is reluctant to try CPAP.  She has dentures, and therefore would not be candidate for oral appliance.  She did also have low oxygen saturation during sleep study.  Will arrange for overnight oximetry on room air and then determine if she could just use supplemental oxygen at night instead of CPAP.

## 2013-11-01 NOTE — Assessment & Plan Note (Signed)
Will continue spiriva and symbicort with prn albuterol.

## 2013-11-01 NOTE — Patient Instructions (Signed)
Will arrange for overnight oxygen test and call with results Follow up in 6 months 

## 2013-11-09 ENCOUNTER — Telehealth: Payer: Self-pay | Admitting: Pulmonary Disease

## 2013-11-09 DIAGNOSIS — G4733 Obstructive sleep apnea (adult) (pediatric): Secondary | ICD-10-CM

## 2013-11-09 NOTE — Telephone Encounter (Signed)
I spoke with the pt and she states that she has been researching alternatives to the cpap and oxygen at night. She states she found a Jaw Support chin strap that states it opens your airways when worn properly. She states she found this on [email or web address]. She wanted to know if this was something that Dr. Halford Chessman thought would be an ok alternative for the pt? Please advise. [place] Bing, CMA

## 2013-11-10 NOTE — Telephone Encounter (Signed)
Spoke with the pt and notified of recs per VS She verbalized understanding  She states that she will wait on deciding what to do after VS has a chance to review her recent ONO on RA  Please advise on ONO results once you return to the office, thanks!

## 2013-11-10 NOTE — Telephone Encounter (Signed)
This type of device is not FDA approved to treat sleep apnea.  Preference would be to try CPAP first.  If she is adamant about not using CPAP, since she has mild sleep apnea, I am not opposed to her trying this device.  She would likely need to have home sleep study while using chin strap to determine if it is working >> please inform pt that f/u sleep study to check device efficacy and purchasing of chin strap might not be covered by insurance since it is not an approved device to treat sleep apnea.

## 2013-11-14 NOTE — Telephone Encounter (Signed)
Pt aware of results and recs per VS Would like to proceed with CPAP  Please advise Dr Halford Chessman. Thanks.

## 2013-11-14 NOTE — Telephone Encounter (Signed)
Please inform pt that I have sent order for CPAP titration study.  Will call her once results available.

## 2013-11-14 NOTE — Telephone Encounter (Signed)
ONO on RA 11/03/13 >> Test time 9 hrs 6 min.  Baseline SpO2 92.3%, low SpO2 81%.  Spent 7 min 40 sec with SpO2 < 88%.  Will have my nurse inform pt that her oxygen level does get low during sleep >> pattern is consistent with oxygen drop related to sleep apnea.    I still recommend that she try CPAP first.  If she is not able to tolerate this, then she could consider trying chin strap from [email or web address].

## 2013-11-16 NOTE — Telephone Encounter (Signed)
Pt aware-- states that she is going to contact insurance to make sure she can afford this test. Appt is 01/02/14 at sleep lab  Will contact our office if need to cancel appt.  Nothing further needed.

## 2013-12-05 ENCOUNTER — Telehealth: Payer: Self-pay | Admitting: Pulmonary Disease

## 2013-12-05 DIAGNOSIS — J449 Chronic obstructive pulmonary disease, unspecified: Secondary | ICD-10-CM

## 2013-12-05 NOTE — Telephone Encounter (Signed)
Pt cancelled CPAP titration study 01/02/14. Her insurance told her she would have to pay $1400. She still owed $700 from the previous study she had done.  She is going to order a chin strap from online to see if this helped. Wants to know if VS wants her to have ONO done once she gets chin strap. Please advise thanks

## 2013-12-05 NOTE — Telephone Encounter (Signed)
Please send order for ONO after she gets chin strap.

## 2013-12-05 NOTE — Telephone Encounter (Signed)
Order placed for ONO once chin strap is received.  Specified in order that patient will contact new DME once chin strap is received so they can proceed with test. Pt aware.  Nothing further needed.

## 2013-12-20 ENCOUNTER — Telehealth: Payer: Self-pay | Admitting: Pulmonary Disease

## 2013-12-20 NOTE — Telephone Encounter (Signed)
ONO with chin strap and RA 12/15/13 >> test time 6 hrs 13 min.  Baseline SpO2 89.4%, low SpO2 69%.  Spent 1 hrs 44 min with SpO2 < 88%.  Will have my nurse inform pt that ONO still shows low oxygen level while asleep.    Options are to 1) reconsider CPAP therapy, 2) try nocturnal oxygen therapy.  If pt would prefer, then she could have ROV first to discuss options in more detail.

## 2013-12-21 ENCOUNTER — Telehealth: Payer: Self-pay | Admitting: Pulmonary Disease

## 2013-12-21 MED ORDER — TIOTROPIUM BROMIDE MONOHYDRATE 18 MCG IN CAPS: 18.0000 ug | ORAL_CAPSULE | Freq: Every day | RESPIRATORY_TRACT | Status: DC

## 2013-12-21 NOTE — Telephone Encounter (Signed)
Pt aware that we do not have any Symbicort 160 samples-- pt to check back next week for samples Spiriva samples have been placed up front  Nothing further needed.

## 2013-12-21 NOTE — Telephone Encounter (Signed)
lmtcb

## 2013-12-21 NOTE — Telephone Encounter (Signed)
Pt returned Elise's call & can be reached [phone].  Satira Anis

## 2013-12-21 NOTE — Telephone Encounter (Signed)
Results have been explained to patient, pt expressed understanding.  Pt states that she would like to think about her two options and call us back next week with a decision.  Will hold in my box to await call back.

## 2013-12-23 ENCOUNTER — Encounter: Payer: Self-pay | Admitting: Pulmonary Disease

## 2013-12-25 NOTE — Telephone Encounter (Signed)
Will have my nurse inform pt that ONO still shows low oxygen level while asleep.  Options are to 1) reconsider CPAP therapy, 2) try nocturnal oxygen therapy. If pt would prefer, then she could have ROV first to discuss options in more detail --  Called spoke with pt. She reports if Dr. Halford Chessman thinks oxygen concentrator at night would help her, thens he prefers this over CPAP. Please advise Dr. Halford Chessman thanks

## 2013-12-27 ENCOUNTER — Telehealth: Payer: Self-pay | Admitting: Pulmonary Disease

## 2013-12-27 DIAGNOSIS — G4733 Obstructive sleep apnea (adult) (pediatric): Secondary | ICD-10-CM

## 2013-12-27 MED ORDER — BUDESONIDE-FORMOTEROL FUMARATE 160-4.5 MCG/ACT IN AERO: 2.0000 | INHALATION_SPRAY | Freq: Two times a day (BID) | RESPIRATORY_TRACT | Status: DC

## 2013-12-27 NOTE — Telephone Encounter (Signed)
Please send order to arrange for 2 liters oxygen at night.  Please arrange for ONO with 2 liters.

## 2013-12-27 NOTE — Telephone Encounter (Signed)
Pt aware 1 sample left for pick up.   Also pt email 12/25/13; Inge Rise, CMA at 12/25/2013 9:48 AM     Status: Signed        Will have my nurse inform pt that ONO still shows low oxygen level while asleep.  Options are to 1) reconsider CPAP therapy, 2) try nocturnal oxygen therapy. If pt would prefer, then she could have ROV first to discuss options in more detail  --  Called spoke with pt. She reports if Dr. Halford Chessman thinks oxygen concentrator at night would help her, thens he prefers this over CPAP.  Please advise Dr. Halford Chessman thanks

## 2013-12-27 NOTE — Telephone Encounter (Signed)
Inge Rise, CMA at 12/25/2013 9:48 AM      Status: Signed         Will have my nurse inform pt that ONO still shows low oxygen level while asleep.  Options are to 1) reconsider CPAP therapy, 2) try nocturnal oxygen therapy. If pt would prefer, then she could have ROV first to discuss options in more detail  --  Called spoke with pt. She reports if Dr. Halford Chessman thinks oxygen concentrator at night would help her, thens he prefers this over CPAP.  Please advise Dr. Halford Chessman thanks    Orders placed per VS for new start O2 @ 2 Liters and repeat ONO on 2 Liters. (see phone note 12/27/13) Pt aware. Nothing further needed.

## 2013-12-27 NOTE — Telephone Encounter (Signed)
Orders placed for Nocturnal O2 - 2 Liters and ONO on 2 Liters. Pt aware.  Nothing further needed.

## 2014-01-02 ENCOUNTER — Encounter (HOSPITAL_BASED_OUTPATIENT_CLINIC_OR_DEPARTMENT_OTHER): Payer: Medicare Other

## 2014-01-02 ENCOUNTER — Telehealth: Payer: Self-pay | Admitting: Pulmonary Disease

## 2014-01-02 NOTE — Telephone Encounter (Signed)
Pt reports she has not been called to do ONO on 2 liters. I advised will call [facility] to see what Spoke with [name] from [facility]. They will be calling pt today. I called made pt aware. Nothing further needed

## 2014-01-17 ENCOUNTER — Encounter: Payer: Self-pay | Admitting: Pulmonary Disease

## 2014-01-23 ENCOUNTER — Telehealth: Payer: Self-pay | Admitting: Pulmonary Disease

## 2014-01-23 NOTE — Telephone Encounter (Signed)
ONO with 2 liters 01/05/14 >> test time 6 hrs 39 min.  Mean SpO2 95.3%, otherwise SpO2 85%.  Spent 52 seconds with SpO2 < 88%.  Will have my nurse inform pt that ONO looked good with 2 liters oxygen at night.

## 2014-01-24 ENCOUNTER — Telehealth: Payer: Self-pay | Admitting: Gastroenterology

## 2014-01-24 ENCOUNTER — Telehealth: Payer: Self-pay | Admitting: Pulmonary Disease

## 2014-01-24 MED ORDER — BUDESONIDE-FORMOTEROL FUMARATE 160-4.5 MCG/ACT IN AERO: 2.0000 | INHALATION_SPRAY | Freq: Two times a day (BID) | RESPIRATORY_TRACT | Status: DC

## 2014-01-24 MED ORDER — AMBULATORY NON FORMULARY MEDICATION: Status: DC

## 2014-01-24 NOTE — Telephone Encounter (Signed)
Pt made aware of ONO results/recs as stated by VS when she called regarding 9.9.15 phone note regarding samples - she will continue to wear 2lpm qhs Nothing further needed; will sign off.

## 2014-01-24 NOTE — Telephone Encounter (Signed)
Pt returned call Spoke with patient and advised we have NO samples of Spiriva in the office at this time.  Advised will provide ONE sample of Symbicort.  Pt stated she will pick this up tomorrow.  Sample documented per protocol. Nothing further needed; will sign off.

## 2014-01-24 NOTE — Telephone Encounter (Signed)
lmomtcb x1 

## 2014-01-24 NOTE — Telephone Encounter (Signed)
Virginia Burns,  I called patient. Patient is requesting refill of Domperidone. Patient is not due for a follow up until 03-2014. Patient is requesting to see Dr. Henrene Pastor. Is it okay to refill Domperidone? ___________________________________________________________________________________________________________________ Dr Buel Ream note(04-11-2013)::Severe documented chronic gastroparesis doing well on domperidone 10 mg 3 times a day before meals. I do not think her current tremors related to this medication since it does not cross the blood-brain barrier. However we will seek neurological consultation with Dr. Posey Pronto given her family history of familial tremor and possibility that she could be treated with benzodiazepines. She is to continue all other medications as listed and reviewed her record with her gastroparesis diet restrictions.  Dr. 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 Her neurological examination and history is most consistent with benign essential tremors. The incidence of extrapyrimidal side effects with domperidone is much less than with reglan and given that she does not have resting tremor, rigidity, or increased tone on exam, my suspicion for drug-induced parkinsonism is very low. Thus, I feel that it is reasonable to keep her on her current medications.

## 2014-01-24 NOTE — Telephone Encounter (Signed)
Ok to refill.  Please schedule appt with Dr. Henrene Pastor.   Thank you,  Jess

## 2014-01-24 NOTE — Telephone Encounter (Signed)
Called patient back, patient has follow up for 11-18. I gave patient number we have for the [place] pharmacy, the pharmacy prefers patients to call them not doctor office. I advised patient to call me back once she speaks to the pharmacy because I need to know if I  can fax prescription over to the pharmacy or does she need to pick it up and send herself.  Patient verbalized understanding.

## 2014-02-02 ENCOUNTER — Telehealth: Payer: Self-pay | Admitting: Pulmonary Disease

## 2014-02-02 MED ORDER — TIOTROPIUM BROMIDE MONOHYDRATE 18 MCG IN CAPS: 18.0000 ug | ORAL_CAPSULE | Freq: Every day | RESPIRATORY_TRACT | Status: DC

## 2014-02-02 NOTE — Telephone Encounter (Signed)
Called spoke with patient and verified hr request for samples of Spiriva. 2 samples obtained and documented per protocol.   Discussed with patient trying to obtain patient assistance for both the Spiriva and Symbicort to see if this would offer any relief.  Pt amenable to trying.  Application for the Symbicort printed and filled in for the provider's section.  Pt is aware to bring this back once she has completed her portion so that we may fax it for her.  Spiriva does not offer a Patient Assistance Program, but does have a [place] that must be done over the phone or online.  Information printed for patient and placed in bag with samples.  Nothing further needed at this time; will sign off.

## 2014-02-21 ENCOUNTER — Telehealth: Payer: Self-pay | Admitting: Pulmonary Disease

## 2014-02-21 NOTE — Telephone Encounter (Signed)
lmomtcb x1 

## 2014-02-22 ENCOUNTER — Telehealth: Payer: Self-pay | Admitting: Pulmonary Disease

## 2014-02-22 MED ORDER — TIOTROPIUM BROMIDE MONOHYDRATE 18 MCG IN CAPS: 18.0000 ug | ORAL_CAPSULE | Freq: Every day | RESPIRATORY_TRACT | Status: DC

## 2014-02-22 MED ORDER — BUDESONIDE-FORMOTEROL FUMARATE 160-4.5 MCG/ACT IN AERO: 2.0000 | INHALATION_SPRAY | Freq: Two times a day (BID) | RESPIRATORY_TRACT | Status: DC

## 2014-02-22 NOTE — Telephone Encounter (Signed)
Called and spoke with pt  And she is aware of samples being left up front.  She will come by today to pick these up.  Pt stated that she has filled out the pt assistance forms and will drop these off today.  Nothing further is needed.

## 2014-02-22 NOTE — Telephone Encounter (Signed)
Will forward to Ashtyn to f/u on 

## 2014-02-23 MED ORDER — BUDESONIDE-FORMOTEROL FUMARATE 160-4.5 MCG/ACT IN AERO: 2.0000 | INHALATION_SPRAY | Freq: Two times a day (BID) | RESPIRATORY_TRACT | Status: DC

## 2014-02-23 NOTE — Telephone Encounter (Addendum)
Rx for Symbicort 160 - 2 puff BID #3 x 4 refills printed and given to VS to sign. Med Assistance forms for [place] faxed back to [phone]. Insurance information, income information and Rx faxed with application. Nothing further needed.

## 2014-03-02 ENCOUNTER — Other Ambulatory Visit: Payer: Self-pay

## 2014-03-29 ENCOUNTER — Telehealth: Payer: Self-pay | Admitting: Pulmonary Disease

## 2014-03-29 NOTE — Telephone Encounter (Signed)
Called and spoke to pt. Pt stated she is waiting to hear back from pt assistance with Spiriva. Pt is requesting samples. Informed pt 1 sample of the spiriva respimat has been left up front and someone will need to show her how to use it- a note has been left on the bag to have someone teach pt. Pt verbalized understanding and denied any further questions or concerns at this time.

## 2014-04-04 ENCOUNTER — Ambulatory Visit (INDEPENDENT_AMBULATORY_CARE_PROVIDER_SITE_OTHER): Payer: Medicare Other | Admitting: Internal Medicine

## 2014-04-04 ENCOUNTER — Encounter: Payer: Self-pay | Admitting: Internal Medicine

## 2014-04-04 VITALS — BP 136/74 | HR 88 | Ht 65.0 in | Wt 210.4 lb

## 2014-04-04 DIAGNOSIS — Z86010 Personal history of colonic polyps: Secondary | ICD-10-CM

## 2014-04-04 DIAGNOSIS — K5901 Slow transit constipation: Secondary | ICD-10-CM

## 2014-04-04 DIAGNOSIS — K3184 Gastroparesis: Secondary | ICD-10-CM

## 2014-04-04 DIAGNOSIS — K219 Gastro-esophageal reflux disease without esophagitis: Secondary | ICD-10-CM

## 2014-04-04 MED ORDER — AMBULATORY NON FORMULARY MEDICATION: Status: DC

## 2014-04-04 NOTE — Patient Instructions (Signed)
I have given you a prescription for a refill of Domperidone and the necessary paperwork to obtain it from [place].    Please follow up with Dr. Henrene Pastor as needed

## 2014-04-04 NOTE — Progress Notes (Signed)
HISTORY OF PRESENT ILLNESS:  Virginia Burns is a 68 y.o. female with multiple medical problems as listed below, including COPD, who presents today for routine GI follow-up regarding management of her chronic GI problems including gastroparesis, GERD, and constipation. She also has a history of adenomatous colon polyps. Previous patient of Dr. Verl Blalock until his retirement. She was last seen in the office 04/11/2013. At that time her gastroparesis was doing well on domperidone 10 mg 3 times a day. 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 Currently continues on her medication without nausea or vomiting. She does take pantoprazole for GERD with good control of symptoms. Current dosages 40 mg twice daily. For constipation, she was intolerant to MiraLAX but does well with consumption of prunes on a regular basis. GI review of systems is otherwise negative. Her last complete colonoscopy in May 2011 revealed a diminutive adenoma and diverticulosis. Follow-up in 5 years recommended.  REVIEW OF SYSTEMS:  All non-GI ROS negative except for cough  Past Medical History  Diagnosis Date  . Personal history of colonic polyps 10/07/2009    tubular adenoma  . Vitamin B12 deficiency   . Gastroparesis   . Diverticulosis of colon (without mention of hemorrhage)   . Family history of malignant neoplasm of gastrointestinal tract   . Osteoporosis   . Hypertension   . Other esophagitis   . COPD (chronic obstructive pulmonary disease)     Past Surgical History  Procedure Laterality Date  . Cholecystectomy    . Abdominal hysterectomy    . Open reduction internal fixation (orif) distal radial fracture Left 04/18/2013    Procedure: LEFT OPEN REDUCTION INTERNAL FIXATION (ORIF) DISTAL RADIAL FRACTURE;  Surgeon: Jolyn Nap, MD;  Location: [facility];  Service: Orthopedics;  Laterality: Left;  . Ligament repair Left 04/18/2013   Procedure: Triangular Fibrocartilage complex open repair;  Surgeon: Jolyn Nap, MD;  Location: [facility];  Service: Orthopedics;  Laterality: Left;    Social History Virginia Burns  reports that she quit smoking about 3 years ago. Her smoking use included Cigarettes. She has a 25 pack-year smoking history. She has never used smokeless tobacco. She reports that she drinks alcohol. She reports that she does not use illicit drugs.  family history includes COPD in her mother; Colitis in her son; Colon cancer in her cousin; Diabetes in her brother; Other in her father; Tremor in her maternal aunt and mother.  Allergies  Allergen Reactions  . Clindamycin/Lincomycin Hives       PHYSICAL EXAMINATION: Vital signs: BP 136/74 mmHg  Pulse 88  Ht 5\' 5"  (1.651 m)  Wt 210 lb 6 oz (95.425 kg)  BMI 35.01 kg/m2 General: Well-developed, well-nourished, no acute distress HEENT: Sclerae are anicteric, conjunctiva pink. Oral mucosa intact Lungs: Clear auscultation and percussion Heart: Regular without murmur Abdomen: soft, obese, nontender, nondistended, no obvious ascites, no peritoneal signs, normal bowel sounds. No organomegaly. Extremities: No edema Psychiatric: alert and oriented x3. Cooperative    ASSESSMENT:  #1. Gastroparesis. Doing well on domperidone 10 mg 3 times a day  #2. Chronic GERD. Requires twice a day PPI for symptom relief. No dysphagia. #3. Previous upper endoscopy March 2009 revealing Candida esophagitis #4. Functional constipation. Managed with prune consumption #5. History of adenomatous colon polyps. Surveillance up-to-date. Due for follow-up around May 2016. Patient aware   PLAN:  #1. Continue domperidone 10 mg 3 times a day. She  obtains this with our assistance, in [place] #2. Reflux precautions #3. Continue twice a day PPI #4. Continue prunes #5. Surveillance colonoscopy next year.

## 2014-04-06 ENCOUNTER — Telehealth: Payer: Self-pay | Admitting: Pulmonary Disease

## 2014-04-06 NOTE — Telephone Encounter (Signed)
Spoke with the pt  She states calling to see if we have any of the regular spiriva  We do not  She will come and pick up the sample of respimat that she never picked up when we spoke with her on 03/29/14  Nothing further needed

## 2014-04-17 ENCOUNTER — Telehealth: Payer: Self-pay | Admitting: Pulmonary Disease

## 2014-04-17 MED ORDER — TIOTROPIUM BROMIDE MONOHYDRATE 18 MCG IN CAPS: 18.0000 ug | ORAL_CAPSULE | Freq: Every day | RESPIRATORY_TRACT | Status: DC

## 2014-04-17 NOTE — Telephone Encounter (Signed)
Called and spoke with pt and she stated that she has the pt assistance and she spoke with them and her insurance and they told her that the rx will need to be sent [facility] on [address] for a 90 day supply.  This has been done and pt is aware and she stated that what her insurance does not cover the pt assistance will cover for her.  Pt is aware that this rx has been sent in and nothing further is needed.

## 2014-07-04 ENCOUNTER — Telehealth: Payer: Self-pay | Admitting: Pulmonary Disease

## 2014-07-04 NOTE — Telephone Encounter (Signed)
Pt is aware that we could not give out Dr.'s email addresses. Advised her that she can bring it by the office, mail it or fax it to [place]. She will have her son fax it to [place]. Nothing further is needed.

## 2014-07-05 ENCOUNTER — Telehealth: Payer: Self-pay | Admitting: Pulmonary Disease

## 2014-07-05 NOTE — Telephone Encounter (Signed)
Will forward to Ashtyn to f/u on

## 2014-07-10 MED ORDER — BUDESONIDE-FORMOTEROL FUMARATE 160-4.5 MCG/ACT IN AERO: 2.0000 | INHALATION_SPRAY | Freq: Two times a day (BID) | RESPIRATORY_TRACT | Status: DC

## 2014-07-10 NOTE — Telephone Encounter (Signed)
Forms are in Dr Halford Chessman look-at, will fax out as soon as he signs them on Wednesday in office.

## 2014-07-10 NOTE — Telephone Encounter (Signed)
Virginia Burns please advise on status of forms.  Thanks!

## 2014-07-12 NOTE — Telephone Encounter (Signed)
Forms completed, faxed back to [facility] [phone] Patient aware this is completed. Placed to scan. Nothing further needed.

## 2014-07-12 NOTE — Telephone Encounter (Signed)
Did this get taken care of? Thanks!

## 2014-07-27 ENCOUNTER — Encounter: Payer: Self-pay | Admitting: Internal Medicine

## 2014-09-12 ENCOUNTER — Telehealth: Payer: Self-pay | Admitting: Internal Medicine

## 2014-09-13 ENCOUNTER — Telehealth: Payer: Self-pay

## 2014-09-13 NOTE — Telephone Encounter (Signed)
Spoke with patient - she needs a refill of her Domperidone which I told her I would fax to the drug store in [place]; she has question about the safety of twice a day PPI; needs to schedule recall colonoscopy at hospital;  I told her I would address each of these issues and call her back with answers/information.

## 2014-09-17 MED ORDER — AMBULATORY NON FORMULARY MEDICATION: Status: DC

## 2014-09-17 NOTE — Telephone Encounter (Signed)
See 09/13/14 telephone note

## 2014-09-17 NOTE — Telephone Encounter (Signed)
This patient needs a routine office appointment to discuss multiple issues as outlined.

## 2014-09-17 NOTE — Telephone Encounter (Signed)
Patient states she takes Pantoprazole twice a day and has heard people say this was not safe for her kidneys.  She wanted to know if she should only take it once a day.    Patient received a recall letter for a colonoscopy.  She is not on a blood thinner.  She is on oxygen at night.  Ok to schedule a direct colon to the hospital?

## 2014-10-03 ENCOUNTER — Telehealth: Payer: Self-pay | Admitting: Internal Medicine

## 2014-10-03 NOTE — Telephone Encounter (Signed)
Spoke with patient and scheduled OV with Dr. Henrene Pastor on 11/07/14 at 1:30 PM.

## 2014-11-07 ENCOUNTER — Encounter: Payer: Self-pay | Admitting: Internal Medicine

## 2014-11-07 ENCOUNTER — Ambulatory Visit (INDEPENDENT_AMBULATORY_CARE_PROVIDER_SITE_OTHER): Payer: Medicare Other | Admitting: Internal Medicine

## 2014-11-07 VITALS — BP 118/68 | HR 80 | Ht 65.0 in | Wt 200.0 lb

## 2014-11-07 DIAGNOSIS — J438 Other emphysema: Secondary | ICD-10-CM

## 2014-11-07 DIAGNOSIS — K219 Gastro-esophageal reflux disease without esophagitis: Secondary | ICD-10-CM

## 2014-11-07 DIAGNOSIS — K5909 Other constipation: Secondary | ICD-10-CM | POA: Diagnosis not present

## 2014-11-07 DIAGNOSIS — Z86010 Personal history of colonic polyps: Secondary | ICD-10-CM | POA: Diagnosis not present

## 2014-11-07 MED ORDER — NA SULFATE-K SULFATE-MG SULF 17.5-3.13-1.6 GM/177ML PO SOLN: 1.0000 | Freq: Once | ORAL | Status: DC

## 2014-11-07 NOTE — Progress Notes (Signed)
HISTORY OF PRESENT ILLNESS:  Virginia Burns is a 69 y.o. female with multiple medical problems as listed below including COPD for which she requires oxygen at night. She has been followed in this office for chronic GERD, gastroparesis, constipation, and adenomatous colon polyps. Last evaluated 04/04/2014. Presents today with chief complaint of worsening constipation, and questions regarding acid reflux medication, and the need to schedule surveillance colonoscopy. First, patient reports worsening constipation over the past 6 months. She cannot identify any precipitants. She uses MiraLAX sporadically. No bleeding or associated abdominal pain. Some intermittent hemorrhoidal symptoms. Her last colonoscopy in May 2011. With tubular adenoma removed. Mild sigmoid diverticulosis. Previous exam performed in the [place]. Now required to have her procedure performed at the hospital due to her oxygen requiring COPD. 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 Finally, the patient has questions regarding chronic PPI use. Specifically, she heard from the lay press possible effects on the kidneys.She continues on domperidone for gastroparesis   REVIEW OF SYSTEMS:  All non-GI ROS negative except for sinus and allergy, fatigue, shortness of breath, excessive thirst  Past Medical History  Diagnosis Date  . Personal history of colonic polyps 10/07/2009    tubular adenoma  . Vitamin B12 deficiency   . Gastroparesis   . Diverticulosis of colon (without mention of hemorrhage)   . Family history of malignant neoplasm of gastrointestinal tract   . Osteoporosis   . Hypertension   . Other esophagitis   . COPD (chronic obstructive pulmonary disease)   . GERD (gastroesophageal reflux disease)     Past Surgical History  Procedure Laterality Date  . Cholecystectomy    . Abdominal hysterectomy    . Open reduction internal fixation (orif) distal radial fracture Left 04/18/2013    Procedure: LEFT OPEN REDUCTION  INTERNAL FIXATION (ORIF) DISTAL RADIAL FRACTURE;  Surgeon: Jolyn Nap, MD;  Location: [place];  Service: Orthopedics;  Laterality: Left;  . Ligament repair Left 04/18/2013    Procedure: Triangular Fibrocartilage complex open repair;  Surgeon: Jolyn Nap, MD;  Location: [place];  Service: Orthopedics;  Laterality: Left;    Social History Virginia Burns  reports that she quit smoking about 4 years ago. Her smoking use included Cigarettes. She has a 25 pack-year smoking history. She has never used smokeless tobacco. She reports that she drinks alcohol. She reports that she does not use illicit drugs.  family history includes COPD in her mother; Colitis in her son; Colon cancer in her cousin; Diabetes in her brother; Other in her father; Tremor in her maternal aunt and mother.  Allergies  Allergen Reactions  . Clindamycin/Lincomycin Hives       PHYSICAL EXAMINATION: Vital signs: BP 118/68 mmHg  Pulse 80  Ht 5\' 5"  (1.651 m)  Wt 200 lb (90.719 kg)  BMI 33.28 kg/m2  Constitutional: generally well-appearing, no acute distress Psychiatric: alert and oriented x3, cooperative Eyes: extraocular movements intact, anicteric, conjunctiva pink Mouth: oral pharynx moist, no lesions Neck: supple no lymphadenopathy Cardiovascular: heart regular rate and rhythm, no murmur Lungs: clear to auscultation bilaterally Abdomen: soft, nontender, nondistended, no obvious ascites, no peritoneal signs, normal bowel sounds, no organomegaly Rectal: Deferred until colonoscopy Extremities: no lower extremity edema bilaterally Skin: no lesions on visible extremities Neuro: No focal deficits. No asterixis.   ASSESSMENT:  #1. Worsening constipation. Functional #2. Chronic GERD. Controlled with pantoprazole 40 mg daily #3. Questions regarding chronic PPI use #4. History of adenomatous colon polyps.  Due for surveillance #5. History of COPD requiring at night  oxygen   PLAN:  #1. Recommend that she take MiraLAX daily. Also instructed her how to titrate to achieve desired effect #2. Reflux precautions #3. We discussed was currently none regarding chronic PPI use and multiple possible long-term effects. She will continue on her PPI, but we will monitor this carefully. PPI refilled when needed #4. Schedule colonoscopy at Hospital. This patient is high-risk due to her lung disease.The nature of the procedure, as well as the risks, benefits, and alternatives were carefully and thoroughly reviewed with the patient. Ample time for discussion and questions allowed. The patient understood, was satisfied, and agreed to proceed. #5. Continue domperidone  #6. Routine office follow-up in 1 year

## 2014-11-07 NOTE — Patient Instructions (Signed)
You have been scheduled for a colonoscopy. Please follow written instructions given to you at your visit today.  Please pick up your prep supplies at the pharmacy within the next 1-3 days. If you use inhalers (even only as needed), please bring them with you on the day of your procedure.   

## 2014-11-12 ENCOUNTER — Other Ambulatory Visit: Payer: Self-pay

## 2014-11-14 ENCOUNTER — Telehealth: Payer: Self-pay | Admitting: Internal Medicine

## 2014-11-16 NOTE — Telephone Encounter (Signed)
Spoke to patient and clarified that patient had already picked up her prep but that if I filled out the [facility] authorization form they might reimburse her.  Form was filled out and faxed.  Patient aware

## 2014-11-20 ENCOUNTER — Encounter (HOSPITAL_COMMUNITY): Payer: Self-pay | Admitting: *Deleted

## 2014-11-21 ENCOUNTER — Encounter (HOSPITAL_COMMUNITY): Payer: Self-pay | Admitting: *Deleted

## 2014-11-21 ENCOUNTER — Ambulatory Visit (HOSPITAL_COMMUNITY)
Admission: RE | Admit: 2014-11-21 | Discharge: 2014-11-21 | Disposition: A | Discharge status: Home / Self Care | Payer: Medicare Other | Source: Ambulatory Visit | Attending: Internal Medicine | Admitting: Internal Medicine

## 2014-11-21 ENCOUNTER — Encounter (HOSPITAL_COMMUNITY)
Admission: RE | Disposition: A | Discharge status: Home / Self Care | Payer: Self-pay | Source: Ambulatory Visit | Attending: Internal Medicine

## 2014-11-21 ENCOUNTER — Ambulatory Visit (HOSPITAL_COMMUNITY): Payer: Medicare Other | Admitting: Certified Registered"

## 2014-11-21 DIAGNOSIS — D124 Benign neoplasm of descending colon: Secondary | ICD-10-CM | POA: Insufficient documentation

## 2014-11-21 DIAGNOSIS — G4730 Sleep apnea, unspecified: Secondary | ICD-10-CM | POA: Diagnosis not present

## 2014-11-21 DIAGNOSIS — K219 Gastro-esophageal reflux disease without esophagitis: Secondary | ICD-10-CM | POA: Insufficient documentation

## 2014-11-21 DIAGNOSIS — Z9981 Dependence on supplemental oxygen: Secondary | ICD-10-CM | POA: Diagnosis not present

## 2014-11-21 DIAGNOSIS — K5909 Other constipation: Secondary | ICD-10-CM

## 2014-11-21 DIAGNOSIS — M810 Age-related osteoporosis without current pathological fracture: Secondary | ICD-10-CM | POA: Insufficient documentation

## 2014-11-21 DIAGNOSIS — Z9049 Acquired absence of other specified parts of digestive tract: Secondary | ICD-10-CM | POA: Diagnosis not present

## 2014-11-21 DIAGNOSIS — Z800 Family history of malignant neoplasm of digestive organs: Secondary | ICD-10-CM | POA: Diagnosis not present

## 2014-11-21 DIAGNOSIS — K5730 Diverticulosis of large intestine without perforation or abscess without bleeding: Secondary | ICD-10-CM | POA: Insufficient documentation

## 2014-11-21 DIAGNOSIS — E538 Deficiency of other specified B group vitamins: Secondary | ICD-10-CM | POA: Diagnosis not present

## 2014-11-21 DIAGNOSIS — K3184 Gastroparesis: Secondary | ICD-10-CM | POA: Diagnosis not present

## 2014-11-21 DIAGNOSIS — I10 Essential (primary) hypertension: Secondary | ICD-10-CM | POA: Diagnosis not present

## 2014-11-21 DIAGNOSIS — K648 Other hemorrhoids: Secondary | ICD-10-CM | POA: Insufficient documentation

## 2014-11-21 DIAGNOSIS — Z09 Encounter for follow-up examination after completed treatment for conditions other than malignant neoplasm: Secondary | ICD-10-CM | POA: Diagnosis present

## 2014-11-21 DIAGNOSIS — Z860100 Personal history of colon polyps, unspecified: Secondary | ICD-10-CM | POA: Insufficient documentation

## 2014-11-21 DIAGNOSIS — J438 Other emphysema: Secondary | ICD-10-CM

## 2014-11-21 DIAGNOSIS — J449 Chronic obstructive pulmonary disease, unspecified: Secondary | ICD-10-CM | POA: Insufficient documentation

## 2014-11-21 DIAGNOSIS — Z87891 Personal history of nicotine dependence: Secondary | ICD-10-CM | POA: Diagnosis not present

## 2014-11-21 DIAGNOSIS — D122 Benign neoplasm of ascending colon: Secondary | ICD-10-CM | POA: Insufficient documentation

## 2014-11-21 DIAGNOSIS — Z79899 Other long term (current) drug therapy: Secondary | ICD-10-CM | POA: Insufficient documentation

## 2014-11-21 HISTORY — DX: Adverse effect of unspecified anesthetic, initial encounter: T41.45XA

## 2014-11-21 HISTORY — DX: Family history of other specified conditions: Z84.89

## 2014-11-21 HISTORY — DX: Other complications of anesthesia, initial encounter: T88.59XA

## 2014-11-21 HISTORY — DX: Reserved for inherently not codable concepts without codable children: IMO0001

## 2014-11-21 HISTORY — DX: Dependence on supplemental oxygen: Z99.81

## 2014-11-21 HISTORY — PX: COLONOSCOPY WITH PROPOFOL: SHX5780

## 2014-11-21 LAB — POCT I-STAT, CHEM 8
CALCIUM ION: 1.2 mmol/L (ref 1.13–1.30)
Chloride: 96 mmol/L — ABNORMAL LOW (ref 101–111)
Creatinine, Ser: 0.5 mg/dL (ref 0.44–1.00)
GLUCOSE: 81 mg/dL (ref 65–99)
HCT: 43 % (ref 36.0–46.0)
Hemoglobin: 14.6 g/dL (ref 12.0–15.0)
Potassium: 4.4 mmol/L (ref 3.5–5.1)
Sodium: 132 mmol/L — ABNORMAL LOW (ref 135–145)
TCO2: 26 mmol/L (ref 0–100)

## 2014-11-21 SURGERY — COLONOSCOPY WITH PROPOFOL
Anesthesia: Monitor Anesthesia Care

## 2014-11-21 MED ORDER — LIDOCAINE HCL (CARDIAC) 20 MG/ML IV SOLN
INTRAVENOUS | Status: DC | PRN
  Administered 2014-11-21: 40 mg via INTRAVENOUS

## 2014-11-21 MED ORDER — PROPOFOL INFUSION 10 MG/ML OPTIME
INTRAVENOUS | Status: DC | PRN
  Administered 2014-11-21: 75 ug/kg/min via INTRAVENOUS

## 2014-11-21 MED ORDER — LACTATED RINGERS IV SOLN
INTRAVENOUS | Status: DC
  Administered 2014-11-21: 08:00:00 via INTRAVENOUS

## 2014-11-21 MED ORDER — PROPOFOL 10 MG/ML IV BOLUS
INTRAVENOUS | Status: DC | PRN
  Administered 2014-11-21: 20 mg via INTRAVENOUS
  Administered 2014-11-21: 30 mg via INTRAVENOUS
  Administered 2014-11-21: 20 mg via INTRAVENOUS

## 2014-11-21 MED ORDER — SODIUM CHLORIDE 0.9 % IV SOLN: INTRAVENOUS | Status: DC

## 2014-11-21 NOTE — Interval H&P Note (Signed)
History and Physical Interval Note:  11/21/2014 9:17 AM  Virginia Burns  has presented today for surgery, with the diagnosis of hx of colon polyps constipation COPD gerd  The various methods of treatment have been discussed with the patient and family. After consideration of risks, benefits and other options for treatment, the patient has consented to  Procedure(s): COLONOSCOPY WITH PROPOFOL (N/A) as a surgical intervention .  The patient's history has been reviewed, patient examined, no change in status, stable for surgery.  I have reviewed the patient's chart and labs.  Questions were answered to the patient's satisfaction.     Scarlette Shorts

## 2014-11-21 NOTE — Anesthesia Preprocedure Evaluation (Addendum)
Anesthesia Evaluation  Patient identified by MRN, date of birth, ID band Patient awake    Reviewed: Allergy & Precautions, NPO status , Patient's Chart, lab work & pertinent test results  Airway Mallampati: II  TM Distance: >3 FB Neck ROM: Full    Dental  (+) Teeth Intact, Dental Advisory Given   Pulmonary shortness of breath, sleep apnea and Oxygen sleep apnea , COPDformer smoker,  breath sounds clear to auscultation        Cardiovascular hypertension, Pt. on medications Rhythm:Regular     Neuro/Psych    GI/Hepatic   Endo/Other    Renal/GU      Musculoskeletal   Abdominal   Peds  Hematology   Anesthesia Other Findings   Reproductive/Obstetrics                           Anesthesia Physical Anesthesia Plan  ASA: III  Anesthesia Plan: MAC   Post-op Pain Management:    Induction: Intravenous  Airway Management Planned: Nasal Cannula  Additional Equipment: None  Intra-op Plan:   Post-operative Plan: Extubation in OR  Informed Consent: I have reviewed the patients History and Physical, chart, labs and discussed the procedure including the risks, benefits and alternatives for the proposed anesthesia with the patient or authorized representative who has indicated his/her understanding and acceptance.   Dental advisory given  Plan Discussed with: CRNA, Anesthesiologist and Surgeon  Anesthesia Plan Comments:         Anesthesia Quick Evaluation

## 2014-11-21 NOTE — Op Note (Signed)
[facility] [place], [id number]   COLONOSCOPY PROCEDURE REPORT  PATIENT: Virginia Burns, Virginia Burns  MR#: [id number] BIRTHDATE: [birth year] , 89  yrs. old GENDER: female ENDOSCOPIST: Eustace Quail, MD REFERRED VO:[id number] Program Recall PROCEDURE DATE:  11/21/2014 PROCEDURE:   Colonoscopy, surveillance and Colonoscopy with snare polypectomy x 2 First Screening Colonoscopy - Avg.  risk and is 50 yrs.  old or older - No.  Prior Negative Screening - Now for repeat screening. N/A  History of Adenoma - Now for follow-up colonoscopy & has been > or = to 3 yrs.  Yes hx of adenoma.  Has been 3 or more years since last colonoscopy.  Polyps removed today? Yes ASA CLASS:   Class III INDICATIONS:Surveillance due to prior colonic neoplasia and PH Colon Adenoma.. Last exam May 2011 with tubular adenoma MEDICATIONS: Per Anesthesia  DESCRIPTION OF PROCEDURE:   After the risks benefits and alternatives of the procedure were thoroughly explained, informed consent was obtained.  The digital rectal exam revealed no abnormalities of the rectum.   The Pentax Adult Colon [id number] endoscope was introduced through the anus and advanced to the cecum, which was identified by both the appendix and ileocecal valve. No adverse events experienced.   The quality of the prep was (MoviPrep was used) excellent.  The instrument was then slowly withdrawn as the colon was fully examined. Estimated blood loss is zero unless otherwise noted in this procedure report.  COLON FINDINGS: Two polyps ranging between 3-70mm in size were found in the descending colon and ascending colon.  A polypectomy was performed with a cold snare.  The resection was complete, the polyp tissue was completely retrieved and sent to histology.   There was moderate diverticulosis noted in the sigmoid colon.   The examination was otherwise normal.  Retroflexed views revealed internal hemorrhoids. The time to  cecum = 4.8 Withdrawal time = 14.7   The scope was withdrawn and the procedure completed. COMPLICATIONS: There were no immediate complications.  ENDOSCOPIC IMPRESSION: 1.   Two polyps were found in the descending colon and ascending colon; polypectomy was performed with a cold snare 2.   Moderate diverticulosis was noted in the sigmoid colon 3.   The examination was otherwise normal  RECOMMENDATIONS: 1. Repeat colonoscopy in 5 years if polyp adenomatous; otherwise 10 years  eSigned:  Eustace Quail, MD 11/21/2014 10:08 AM   cc: The Patient and Eldridge Abrahams MD

## 2014-11-21 NOTE — Transfer of Care (Signed)
Immediate Anesthesia Transfer of Care Note  Patient: Virginia Burns  Procedure(s) Performed: Procedure(s): COLONOSCOPY WITH PROPOFOL (N/A)  Patient Location: Endoscopy Unit  Anesthesia Type:MAC  Level of Consciousness: awake and oriented  Airway & Oxygen Therapy: Patient Spontanous Breathing and Patient connected to nasal cannula oxygen  Post-op Assessment: Report given to RN, Post -op Vital signs reviewed and stable and Patient moving all extremities  Post vital signs: Reviewed and stable  Last Vitals:  Filed Vitals:   11/21/14 0752  BP: 134/54  Pulse: 74  Temp: 36.6 C  Resp: 18    Complications: No apparent anesthesia complications

## 2014-11-21 NOTE — Discharge Instructions (Signed)
Colonoscopy, Care After °These instructions give you information on caring for yourself after your procedure. Your doctor may also give you more specific instructions. Call your doctor if you have any problems or questions after your procedure. °HOME CARE °· Do not drive for 24 hours. °· Do not sign important papers or use machinery for 24 hours. °· You may shower. °· You may go back to your usual activities, but go slower for the first 24 hours. °· Take rest breaks often during the first 24 hours. °· Walk around or use warm packs on your belly (abdomen) if you have belly cramping or gas. °· Drink enough fluids to keep your pee (urine) clear or pale yellow. °· Resume your normal diet. Avoid heavy or fried foods. °· Avoid drinking alcohol for 24 hours or as told by your doctor. °· Only take medicines as told by your doctor. °If a tissue sample (biopsy) was taken during the procedure:  °· Do not take aspirin or blood thinners for 7 days, or as told by your doctor. °· Do not drink alcohol for 7 days, or as told by your doctor. °· Eat soft foods for the first 24 hours. °GET HELP IF: °You still have a small amount of blood in your poop (stool) 2-3 days after the procedure. °GET HELP RIGHT AWAY IF: °· You have more than a small amount of blood in your poop. °· You see clumps of tissue (blood clots) in your poop. °· Your belly is puffy (swollen). °· You feel sick to your stomach (nauseous) or throw up (vomit). °· You have a fever. °· You have belly pain that gets worse and medicine does not help. °MAKE SURE YOU: °· Understand these instructions. °· Will watch your condition. °· Will get help right away if you are not doing well or get worse. °Document Released: 06/06/2010 Document Revised: 05/09/2013 Document Reviewed: 01/09/2013 °ExitCare® Patient Information ©2015 [employer]. This information is not intended to replace advice given to you by your health care provider. Make sure you discuss any questions you have with  your health care provider. ° °

## 2014-11-21 NOTE — H&P (View-Only) (Signed)
HISTORY OF PRESENT ILLNESS:  Virginia Burns is a 69 y.o. female with multiple medical problems as listed below including COPD for which she requires oxygen at night. She has been followed in this office for chronic GERD, gastroparesis, constipation, and adenomatous colon polyps. Last evaluated 04/04/2014. Presents today with chief complaint of worsening constipation, and questions regarding acid reflux medication, and the need to schedule surveillance colonoscopy. First, patient reports worsening constipation over the past 6 months. She cannot identify any precipitants. She uses MiraLAX sporadically. No bleeding or associated abdominal pain. Some intermittent hemorrhoidal symptoms. Her last colonoscopy in May 2011. With tubular adenoma removed. Mild sigmoid diverticulosis. Previous exam performed in the [place]. Now required to have her procedure performed at the hospital due to her oxygen requiring COPD. 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 Finally, the patient has questions regarding chronic PPI use. Specifically, she heard from the lay press possible effects on the kidneys.She continues on domperidone for gastroparesis   REVIEW OF SYSTEMS:  All non-GI ROS negative except for sinus and allergy, fatigue, shortness of breath, excessive thirst  Past Medical History  Diagnosis Date  . Personal history of colonic polyps 10/07/2009    tubular adenoma  . Vitamin B12 deficiency   . Gastroparesis   . Diverticulosis of colon (without mention of hemorrhage)   . Family history of malignant neoplasm of gastrointestinal tract   . Osteoporosis   . Hypertension   . Other esophagitis   . COPD (chronic obstructive pulmonary disease)   . GERD (gastroesophageal reflux disease)     Past Surgical History  Procedure Laterality Date  . Cholecystectomy    . Abdominal hysterectomy    . Open reduction internal fixation (orif) distal radial fracture Left 04/18/2013    Procedure: LEFT OPEN REDUCTION  INTERNAL FIXATION (ORIF) DISTAL RADIAL FRACTURE;  Surgeon: Jolyn Nap, MD;  Location: [place];  Service: Orthopedics;  Laterality: Left;  . Ligament repair Left 04/18/2013    Procedure: Triangular Fibrocartilage complex open repair;  Surgeon: Jolyn Nap, MD;  Location: [place];  Service: Orthopedics;  Laterality: Left;    Social History DEARRA MYHAND  reports that she quit smoking about 4 years ago. Her smoking use included Cigarettes. She has a 25 pack-year smoking history. She has never used smokeless tobacco. She reports that she drinks alcohol. She reports that she does not use illicit drugs.  family history includes COPD in her mother; Colitis in her son; Colon cancer in her cousin; Diabetes in her brother; Other in her father; Tremor in her maternal aunt and mother.  Allergies  Allergen Reactions  . Clindamycin/Lincomycin Hives       PHYSICAL EXAMINATION: Vital signs: BP 118/68 mmHg  Pulse 80  Ht 5\' 5"  (1.651 m)  Wt 200 lb (90.719 kg)  BMI 33.28 kg/m2  Constitutional: generally well-appearing, no acute distress Psychiatric: alert and oriented x3, cooperative Eyes: extraocular movements intact, anicteric, conjunctiva pink Mouth: oral pharynx moist, no lesions Neck: supple no lymphadenopathy Cardiovascular: heart regular rate and rhythm, no murmur Lungs: clear to auscultation bilaterally Abdomen: soft, nontender, nondistended, no obvious ascites, no peritoneal signs, normal bowel sounds, no organomegaly Rectal: Deferred until colonoscopy Extremities: no lower extremity edema bilaterally Skin: no lesions on visible extremities Neuro: No focal deficits. No asterixis.   ASSESSMENT:  #1. Worsening constipation. Functional #2. Chronic GERD. Controlled with pantoprazole 40 mg daily #3. Questions regarding chronic PPI use #4. History of adenomatous colon polyps.  Due for surveillance #5. History of COPD requiring at night  oxygen   PLAN:  #1. Recommend that she take MiraLAX daily. Also instructed her how to titrate to achieve desired effect #2. Reflux precautions #3. We discussed was currently none regarding chronic PPI use and multiple possible long-term effects. She will continue on her PPI, but we will monitor this carefully. PPI refilled when needed #4. Schedule colonoscopy at Hospital. This patient is high-risk due to her lung disease.The nature of the procedure, as well as the risks, benefits, and alternatives were carefully and thoroughly reviewed with the patient. Ample time for discussion and questions allowed. The patient understood, was satisfied, and agreed to proceed. #5. Continue domperidone  #6. Routine office follow-up in 1 year

## 2014-11-22 ENCOUNTER — Encounter: Payer: Self-pay | Admitting: Internal Medicine

## 2014-11-22 ENCOUNTER — Encounter (HOSPITAL_COMMUNITY): Payer: Self-pay | Admitting: Internal Medicine

## 2014-11-23 NOTE — Anesthesia Postprocedure Evaluation (Signed)
Anesthesia Post Note  Patient: Virginia Burns  Procedure(s) Performed: Procedure(s) (LRB): COLONOSCOPY WITH PROPOFOL (N/A)  Anesthesia type: MAC  Patient location: PACU  Post pain: Pain level controlled  Post assessment: Post-op Vital signs reviewed  Last Vitals: BP 143/72 mmHg  Pulse 77  Temp(Src) 36.4 C (Oral)  Resp 18  SpO2 99%  Post vital signs: Reviewed  Level of consciousness: awake  Complications: No apparent anesthesia complications

## 2014-11-29 ENCOUNTER — Telehealth: Payer: Self-pay | Admitting: Internal Medicine

## 2014-11-29 NOTE — Telephone Encounter (Signed)
Pt received results letter in the mail and states she saw path report on mychart. Pt wants to know what the comment below on her path report means. Please advise.  Microscopic Comment Of note, there is a prominent acute inflammatory response seen in one of the fragments of tubular adenoma including cryptitis and crypt abscess formation. Although this may be an incidental isolated response confined to the adenomatous polyp, correlation with clinical and endoscopic impression is suggested. (RAH:gt, 11/22/14)

## 2014-11-29 NOTE — Telephone Encounter (Signed)
Spoke with pt and she is aware, recall in for 5 years.

## 2014-11-29 NOTE — Telephone Encounter (Signed)
It means nothing. Keep plans for colonoscopy 5 years

## 2014-12-07 DIAGNOSIS — D122 Benign neoplasm of ascending colon: Secondary | ICD-10-CM

## 2014-12-07 DIAGNOSIS — J432 Centrilobular emphysema: Secondary | ICD-10-CM

## 2014-12-07 DIAGNOSIS — D124 Benign neoplasm of descending colon: Secondary | ICD-10-CM | POA: Insufficient documentation

## 2014-12-07 DIAGNOSIS — Z9981 Dependence on supplemental oxygen: Secondary | ICD-10-CM

## 2014-12-07 HISTORY — DX: Dependence on supplemental oxygen: Z99.81

## 2014-12-07 HISTORY — DX: Benign neoplasm of ascending colon: D12.2

## 2014-12-07 HISTORY — DX: Benign neoplasm of descending colon: D12.4

## 2014-12-07 HISTORY — DX: Centrilobular emphysema: J43.2

## 2015-02-25 ENCOUNTER — Telehealth: Payer: Self-pay | Admitting: Internal Medicine

## 2015-02-28 NOTE — Telephone Encounter (Signed)
Patient calling back regarding this.  °

## 2015-03-01 MED ORDER — AMBULATORY NON FORMULARY MEDICATION: Status: DC

## 2015-03-01 NOTE — Telephone Encounter (Signed)
Faxed refill for Domperidone to [facility].  They will contact patient if they need payment information.   Called patient to let her know.

## 2015-03-04 ENCOUNTER — Encounter: Payer: Self-pay | Admitting: Adult Health

## 2015-03-04 ENCOUNTER — Ambulatory Visit (INDEPENDENT_AMBULATORY_CARE_PROVIDER_SITE_OTHER): Payer: Medicare Other | Admitting: Adult Health

## 2015-03-04 VITALS — BP 120/72 | HR 91 | Temp 98.1°F | Ht 66.0 in | Wt 196.6 lb

## 2015-03-04 DIAGNOSIS — J9611 Chronic respiratory failure with hypoxia: Secondary | ICD-10-CM | POA: Diagnosis not present

## 2015-03-04 DIAGNOSIS — J9610 Chronic respiratory failure, unspecified whether with hypoxia or hypercapnia: Secondary | ICD-10-CM

## 2015-03-04 DIAGNOSIS — J449 Chronic obstructive pulmonary disease, unspecified: Secondary | ICD-10-CM

## 2015-03-04 HISTORY — DX: Chronic respiratory failure, unspecified whether with hypoxia or hypercapnia: J96.10

## 2015-03-04 MED ORDER — TIOTROPIUM BROMIDE MONOHYDRATE 18 MCG IN CAPS: 18.0000 ug | ORAL_CAPSULE | Freq: Every day | RESPIRATORY_TRACT | Status: DC

## 2015-03-04 MED ORDER — BUDESONIDE-FORMOTEROL FUMARATE 160-4.5 MCG/ACT IN AERO: 2.0000 | INHALATION_SPRAY | Freq: Two times a day (BID) | RESPIRATORY_TRACT | Status: DC

## 2015-03-04 NOTE — Addendum Note (Signed)
Addended by: Osa Craver on: 03/04/2015 03:58 PM   Modules accepted: Orders

## 2015-03-04 NOTE — Assessment & Plan Note (Signed)
Nocturnal Desats   Plan  Continue on Oxygen 2l/m At bedtime   Get flu shot as planned  follow up Dr. Halford Chessman  In 1 year and As needed

## 2015-03-04 NOTE — Progress Notes (Signed)
   Subjective:    Patient ID: Virginia Burns, female    DOB: Nov 21, 1945, 69 y.o.   MRN: [id number]  HPI 69 yo female with COPD and Mild OSA (declined CPAP ) , on nocturnal O2.   TESTS: PFT 08/09/07 >> FEV1 1.54 (63%), FEV1% 53, TLC 3.42 (65%), DLCO 64%, no BD PFT 02/10/13 >> FEV1 1.32 (52%), FEV1% 58, TLC 5.07 (96%), DLCO 60%, no BD PSG 10/24/13 >> AHI 6.8, SaO2 low 84%, Spent 28.3 min with SaO2 < 90%.   03/04/2015 Follow up : COPD and O2 At bedtime   Pt returns for annual follow up .  Remains on Symbicort and Spiriva . Gets pt assistance , forms filled out.  Says overall she is doing well.  Had bronchitis couple of months ago, resolved with ABX from PCP. Says  She got an xray that was reported as ok.  Remains on O2 At bedtime  .  She has mild OSA , declined CPAP .  She denies chest pain, orthopnea, hemoptysis , edema or fever.     Review of Systems ,Constitutional:   No  weight loss, night sweats,  Fevers, chills,  +fatigue, or  lassitude.  HEENT:   No headaches,  Difficulty swallowing,  Tooth/dental problems, or  Sore throat,                No sneezing, itching, ear ache, nasal congestion, post nasal drip,   CV:  No chest pain,  Orthopnea, PND, swelling in lower extremities, anasarca, dizziness, palpitations, syncope.   GI  No heartburn, indigestion, abdominal pain, nausea, vomiting, diarrhea, change in bowel habits, loss of appetite, bloody stools.   Resp:    No chest wall deformity  Skin: no rash or lesions.  GU: no dysuria, change in color of urine, no urgency or frequency.  No flank pain, no hematuria   MS:  No joint pain or swelling.  No decreased range of motion.  No back pain.  Psych:  No change in mood or affect. No depression or anxiety.  No memory loss.         Objective:   Physical Exam GEN: A/Ox3; pleasant , NAD, elderly   HEENT:  [place]/AT,  EACs-clear, TMs-wnl, NOSE-clear, THROAT-clear, no lesions, no postnasal drip or exudate noted.   NECK:  Supple  w/ fair ROM; no JVD; normal carotid impulses w/o bruits; no thyromegaly or nodules palpated; no lymphadenopathy.  RESP  Decreased BS in bases .no accessory muscle use, no dullness to percussion  CARD:  RRR, no m/r/g  , no peripheral edema, pulses intact, no cyanosis or clubbing.  GI:   Soft & nt; nml bowel sounds; no organomegaly or masses detected.  Musco: Warm bil, no deformities or joint swelling noted.   Neuro: alert, no focal deficits noted.    Skin: Warm, no lesions or rashes         Assessment & Plan:

## 2015-03-04 NOTE — Progress Notes (Signed)
Reviewed and agree with assessment/plan. 

## 2015-03-04 NOTE — Assessment & Plan Note (Addendum)
Compensated on present regimen  Paperwork completed for pt assistance  Plan  Continue on Symbicort 2 puffs Twice daily   Continue on Spriva  1 puff daily  Continue on Oxygen 2l/m At bedtime   Get flu shot as planned  follow up Dr. Halford Chessman  In 1 year and As needed   Discussed LD screening CT , will consider on return

## 2015-03-04 NOTE — Addendum Note (Signed)
Addended by: Levander Campion on: 03/04/2015 03:38 PM   Modules accepted: Orders

## 2015-03-04 NOTE — Patient Instructions (Signed)
Continue on Symbicort 2 puffs Twice daily   Continue on Spriva  1 puff daily  Continue on Oxygen 2l/m At bedtime   Get flu shot as planned  follow up Dr. Halford Chessman  In 1 year and As needed

## 2015-05-23 DIAGNOSIS — N3281 Overactive bladder: Secondary | ICD-10-CM | POA: Diagnosis not present

## 2015-05-23 DIAGNOSIS — R350 Frequency of micturition: Secondary | ICD-10-CM | POA: Diagnosis not present

## 2015-05-23 DIAGNOSIS — R300 Dysuria: Secondary | ICD-10-CM | POA: Diagnosis not present

## 2015-05-31 DIAGNOSIS — R300 Dysuria: Secondary | ICD-10-CM | POA: Diagnosis not present

## 2015-06-17 DIAGNOSIS — R300 Dysuria: Secondary | ICD-10-CM | POA: Diagnosis not present

## 2015-06-17 DIAGNOSIS — N390 Urinary tract infection, site not specified: Secondary | ICD-10-CM | POA: Diagnosis not present

## 2015-06-20 DIAGNOSIS — N390 Urinary tract infection, site not specified: Secondary | ICD-10-CM | POA: Diagnosis not present

## 2015-06-20 DIAGNOSIS — K5900 Constipation, unspecified: Secondary | ICD-10-CM | POA: Diagnosis not present

## 2015-06-20 DIAGNOSIS — N3281 Overactive bladder: Secondary | ICD-10-CM | POA: Diagnosis not present

## 2015-07-02 DIAGNOSIS — G4733 Obstructive sleep apnea (adult) (pediatric): Secondary | ICD-10-CM | POA: Diagnosis not present

## 2015-07-22 ENCOUNTER — Encounter: Payer: Self-pay | Admitting: Gastroenterology

## 2015-08-08 DIAGNOSIS — J309 Allergic rhinitis, unspecified: Secondary | ICD-10-CM | POA: Diagnosis not present

## 2015-08-08 DIAGNOSIS — R05 Cough: Secondary | ICD-10-CM | POA: Diagnosis not present

## 2015-08-23 ENCOUNTER — Telehealth: Payer: Self-pay | Admitting: Pulmonary Disease

## 2015-08-23 MED ORDER — BUDESONIDE-FORMOTEROL FUMARATE 160-4.5 MCG/ACT IN AERO: 2.0000 | INHALATION_SPRAY | Freq: Two times a day (BID) | RESPIRATORY_TRACT | Status: DC

## 2015-08-23 NOTE — Telephone Encounter (Signed)
Last ov 10.17.16 w/ TP: Patient Instructions       Continue on Symbicort 2 puffs Twice daily    Continue on Spriva  1 puff daily   Continue on Oxygen 2l/m At bedtime    Get flu shot as planned   follow up Dr. Halford Chessman  In 1 year and As needed        Called spoke with patient to verify medication needing refill and pharmacy - pt requesting 3 month supply with 1 refill to last until she is due for follow up 02/2017 Rx sent  Nothing further needed; will sign off

## 2015-08-27 DIAGNOSIS — H25093 Other age-related incipient cataract, bilateral: Secondary | ICD-10-CM | POA: Diagnosis not present

## 2015-08-27 DIAGNOSIS — H52223 Regular astigmatism, bilateral: Secondary | ICD-10-CM | POA: Diagnosis not present

## 2015-08-27 DIAGNOSIS — H524 Presbyopia: Secondary | ICD-10-CM | POA: Diagnosis not present

## 2015-08-27 DIAGNOSIS — H5213 Myopia, bilateral: Secondary | ICD-10-CM | POA: Diagnosis not present

## 2015-09-16 DIAGNOSIS — E2839 Other primary ovarian failure: Secondary | ICD-10-CM | POA: Diagnosis not present

## 2015-09-16 DIAGNOSIS — I10 Essential (primary) hypertension: Secondary | ICD-10-CM | POA: Diagnosis not present

## 2015-09-16 DIAGNOSIS — Z1159 Encounter for screening for other viral diseases: Secondary | ICD-10-CM | POA: Diagnosis not present

## 2015-09-16 DIAGNOSIS — Z1239 Encounter for other screening for malignant neoplasm of breast: Secondary | ICD-10-CM | POA: Diagnosis not present

## 2015-09-16 DIAGNOSIS — E559 Vitamin D deficiency, unspecified: Secondary | ICD-10-CM | POA: Diagnosis not present

## 2015-10-01 DIAGNOSIS — R319 Hematuria, unspecified: Secondary | ICD-10-CM | POA: Diagnosis not present

## 2015-10-01 DIAGNOSIS — R350 Frequency of micturition: Secondary | ICD-10-CM | POA: Diagnosis not present

## 2015-10-01 DIAGNOSIS — R300 Dysuria: Secondary | ICD-10-CM | POA: Diagnosis not present

## 2015-10-22 DIAGNOSIS — Z1231 Encounter for screening mammogram for malignant neoplasm of breast: Secondary | ICD-10-CM | POA: Diagnosis not present

## 2015-10-22 DIAGNOSIS — E2839 Other primary ovarian failure: Secondary | ICD-10-CM | POA: Diagnosis not present

## 2015-10-22 DIAGNOSIS — M8589 Other specified disorders of bone density and structure, multiple sites: Secondary | ICD-10-CM | POA: Diagnosis not present

## 2015-10-22 DIAGNOSIS — Z780 Asymptomatic menopausal state: Secondary | ICD-10-CM | POA: Diagnosis not present

## 2015-10-22 DIAGNOSIS — Z1239 Encounter for other screening for malignant neoplasm of breast: Secondary | ICD-10-CM | POA: Diagnosis not present

## 2015-10-28 DIAGNOSIS — N390 Urinary tract infection, site not specified: Secondary | ICD-10-CM | POA: Diagnosis not present

## 2015-10-28 DIAGNOSIS — Z9181 History of falling: Secondary | ICD-10-CM | POA: Diagnosis not present

## 2015-11-27 DIAGNOSIS — R42 Dizziness and giddiness: Secondary | ICD-10-CM | POA: Diagnosis not present

## 2015-12-17 DIAGNOSIS — J449 Chronic obstructive pulmonary disease, unspecified: Secondary | ICD-10-CM | POA: Diagnosis not present

## 2015-12-18 ENCOUNTER — Telehealth: Payer: Self-pay | Admitting: Pulmonary Disease

## 2015-12-18 DIAGNOSIS — J449 Chronic obstructive pulmonary disease, unspecified: Secondary | ICD-10-CM | POA: Diagnosis not present

## 2015-12-18 MED ORDER — TIOTROPIUM BROMIDE MONOHYDRATE 18 MCG IN CAPS: 18.0000 ug | ORAL_CAPSULE | Freq: Every day | RESPIRATORY_TRACT | 3 refills | Status: DC

## 2015-12-18 MED ORDER — BUDESONIDE-FORMOTEROL FUMARATE 160-4.5 MCG/ACT IN AERO: 2.0000 | INHALATION_SPRAY | Freq: Two times a day (BID) | RESPIRATORY_TRACT | 0 refills | Status: DC

## 2015-12-18 NOTE — Telephone Encounter (Signed)
Went to lobby to speak with patient  She had a patient assistance form for her Spiriva HH and asked for samples of this - she is in the donut hole and this Rx costs $400.  Unfortunately we do not receive samples of the [employer] anymore.  Did offer samples of her Symbicort 160.  Samples documented per protocol Spiriva HH Rx printed for VS to sign and patient assistance forms handed to Ashtyn  Will route to Ashtyn and VS to ensure follow up

## 2015-12-23 NOTE — Telephone Encounter (Signed)
Patient Assistance forms were not able to be faxed back because Dr Halford Chessman missed a signature area.  These forms are in VS look at to be signed. Vs should be back in office later this week.  Will send to VS to make aware.

## 2015-12-24 DIAGNOSIS — G4733 Obstructive sleep apnea (adult) (pediatric): Secondary | ICD-10-CM | POA: Diagnosis not present

## 2015-12-24 DIAGNOSIS — I10 Essential (primary) hypertension: Secondary | ICD-10-CM | POA: Diagnosis not present

## 2015-12-25 NOTE — Telephone Encounter (Addendum)
Form signed and in my office. 

## 2015-12-26 NOTE — Telephone Encounter (Signed)
Forms have been faxed back to [facility] [phone] Pt aware. Aware also to let us know if she does not hear from them within the next 1-1.5 weeks.  I will hold forms for 2 weeks just in case.

## 2016-01-04 DIAGNOSIS — S9031XA Contusion of right foot, initial encounter: Secondary | ICD-10-CM | POA: Diagnosis not present

## 2016-01-04 DIAGNOSIS — S50312A Abrasion of left elbow, initial encounter: Secondary | ICD-10-CM | POA: Diagnosis not present

## 2016-02-19 ENCOUNTER — Other Ambulatory Visit: Payer: Self-pay | Admitting: Adult Health

## 2016-03-02 DIAGNOSIS — R300 Dysuria: Secondary | ICD-10-CM | POA: Diagnosis not present

## 2016-03-02 DIAGNOSIS — Z23 Encounter for immunization: Secondary | ICD-10-CM | POA: Diagnosis not present

## 2016-03-02 DIAGNOSIS — G4700 Insomnia, unspecified: Secondary | ICD-10-CM | POA: Diagnosis not present

## 2016-03-03 DIAGNOSIS — N390 Urinary tract infection, site not specified: Secondary | ICD-10-CM | POA: Diagnosis not present

## 2016-03-03 DIAGNOSIS — T887XXA Unspecified adverse effect of drug or medicament, initial encounter: Secondary | ICD-10-CM | POA: Diagnosis not present

## 2016-03-03 DIAGNOSIS — Z9181 History of falling: Secondary | ICD-10-CM | POA: Diagnosis not present

## 2016-03-10 ENCOUNTER — Encounter: Payer: Self-pay | Admitting: Pulmonary Disease

## 2016-03-10 ENCOUNTER — Ambulatory Visit (INDEPENDENT_AMBULATORY_CARE_PROVIDER_SITE_OTHER): Payer: PPO | Admitting: Pulmonary Disease

## 2016-03-10 VITALS — BP 138/88 | HR 78 | Ht 65.5 in | Wt 174.2 lb

## 2016-03-10 DIAGNOSIS — J449 Chronic obstructive pulmonary disease, unspecified: Secondary | ICD-10-CM | POA: Diagnosis not present

## 2016-03-10 DIAGNOSIS — G4733 Obstructive sleep apnea (adult) (pediatric): Secondary | ICD-10-CM | POA: Diagnosis not present

## 2016-03-10 MED ORDER — BUDESONIDE-FORMOTEROL FUMARATE 160-4.5 MCG/ACT IN AERO: 2.0000 | INHALATION_SPRAY | Freq: Two times a day (BID) | RESPIRATORY_TRACT | 3 refills | Status: DC

## 2016-03-10 NOTE — Addendum Note (Signed)
Addended by: Virl Cagey on: 03/10/2016 04:12 PM   Modules accepted: Orders

## 2016-03-10 NOTE — Progress Notes (Signed)
   Subjective:    Patient ID: Virginia Burns, female    DOB: 1945/10/05, 70 y.o.   MRN: [id number]  HPI    Review of Systems  Constitutional: Positive for unexpected weight change. Negative for fever.  HENT: Positive for dental problem. Negative for congestion, ear pain, nosebleeds, postnasal drip, rhinorrhea, sinus pressure, sneezing, sore throat and trouble swallowing.   Eyes: Negative for redness and itching.  Respiratory: Positive for shortness of breath. Negative for cough, chest tightness and wheezing.   Cardiovascular: Negative for palpitations and leg swelling.  Gastrointestinal: Negative for nausea and vomiting.  Genitourinary: Negative for dysuria.  Musculoskeletal: Negative for joint swelling.  Skin: Positive for rash ( allergy to medication).  Neurological: Negative for headaches.  Hematological: Does not bruise/bleed easily.  Psychiatric/Behavioral: Negative for dysphoric mood. The patient is nervous/anxious.        Objective:   Physical Exam        Assessment & Plan:

## 2016-03-10 NOTE — Progress Notes (Addendum)
Current Outpatient Prescriptions on File Prior to Visit  Medication Sig  . albuterol (PROAIR HFA) 108 (90 BASE) MCG/ACT inhaler Inhale 2 puffs into the lungs every 6 (six) hours as needed for wheezing or shortness of breath.  . AMBULATORY NON FORMULARY MEDICATION Pt on Oxygen, 2 units, at night  . AMBULATORY NON FORMULARY MEDICATION Medication Name: Domperidone 10 mg                               Please take one tablet by mouth three times daily before meals  . amLODipine (NORVASC) 5 MG tablet Take 7.5 mg by mouth daily.   Marland Tafolla aspirin 81 MG tablet Take 81 mg by mouth every other day.   . budesonide-formoterol (SYMBICORT) 160-4.5 MCG/ACT inhaler Inhale 2 puffs into the lungs 2 (two) times daily.  . Calcium Carbonate-Vit D-Min (CALCIUM-VITAMIN D-MINERALS) 600-400 MG-UNIT CHEW Chew 1 tablet by mouth daily.  . cholecalciferol (VITAMIN D) 1000 UNITS tablet Take 1,000 Units by mouth daily.  Marland Orgeron estradiol (ESTRACE VAGINAL) 0.1 MG/GM vaginal cream as needed. Frequency:to area 2-3 times weekly   Dosage:0.1   MG/GM  Instructions:Estrace 0.1MG /GM, 1 application to area 2-3 times weekly  Note:  . guaiFENesin (MUCINEX) 600 MG 12 hr tablet Take 1 tablet (600 mg total) by mouth 2 (two) times daily. For 5 days (Patient taking differently: Take 600 mg by mouth daily. )  . LORazepam (ATIVAN) 0.5 MG tablet Take 0.5 mg by mouth as needed for anxiety.   . Multiple Vitamin (MULTIVITAMIN WITH MINERALS) TABS tablet Take 1 tablet by mouth every other day.   . pantoprazole (PROTONIX) 40 MG tablet Take 40 mg by mouth 2 (two) times daily.   . Probiotic Product (VSL#3) CAPS Take 1 capsule by mouth daily.  Marland Tuite tiotropium (SPIRIVA) 18 MCG inhalation capsule Place 1 capsule (18 mcg total) into inhaler and inhale daily.  . vitamin C (ASCORBIC ACID) 500 MG tablet Take 500 mg by mouth daily.  . vitamin E 400 UNIT capsule Take 400 Units by mouth daily.  . Wound Dressings (SAF-GEL) GEL Apply 1 application topically Two (2) times a day.    No current facility-administered medications on file prior to visit.      Chief Complaint  Patient presents with  . Follow-up    Here to establish for Sleep. Refill on the symbicort 3 month supply.      Pulmonary tests PFT 08/09/07 >> FEV1 1.54 (63%), FEV1% 53, TLC 3.42 (65%), DLCO 64%, no BD PFT 02/10/13 >> FEV1 1.32 (52%), FEV1% 58, TLC 5.07 (96%), DLCO 60%, no BD  Sleep tests PSG 10/24/13 >> AHI 6.8, SaO2 low 84%, Spent 28.3 min with SaO2 < 90%.  Past medical history Diverticulosis, GERD, Gastroparesis, HTN, Osteoporosis  Past surgical history, Family history, Social history, Allergies all reviewed.  Vital Signs BP 138/88 (BP Location: Left Arm, Cuff Size: Normal)   Pulse 78   Ht 5' 5.5" (1.664 m)   Wt 174 lb 3.2 oz (79 kg)   SpO2 96%   BMI 28.55 kg/m   History of Present Illness Virginia Burns is a 70 y.o. female with COPD and OSA.  She was seen by PCP.  She had ONO and started on oxygen at night.  She was told she should have follow up for her sleep apnea.  She still snores, and gets tired during the day.  She has been told she still stops breathing while asleep.  She is not having cough, wheeze, or sputum.  Still gets winded with activity.  She is set up to start rehab for her balance and strength training.  Physical Exam  General - No distress ENT - No sinus tenderness, no oral exudate, no LAN, wears dentures Cardiac - s1s2 regular, no murmur Chest - No wheeze/rales/dullness Back - No focal tenderness Abd - Soft, non-tender Ext - No edema Neuro - Normal strength Skin - No rashes Psych - normal mood, and behavior   Assessment/Plan  COPD with chronic bronchitis. - continue spiriva, symbicort and prn albuterol  Obstructive sleep apnea. - she is willing to try CPAP - will arrange for auto CPAP set up with nasal mask - will arrange for ONO with CPAP and determine if she can d/c home oxygen set up   Patient Instructions  Will arrange for CPAP  set up Will arrange for overnight oxygen test while using CPAP  Follow up in 3 months with Dr. Beckey Rutter, MD  Pulmonary/Critical Care/Sleep Pager:  [phone] 03/10/2016, 4:04 PM

## 2016-03-10 NOTE — Patient Instructions (Addendum)
Will arrange for CPAP set up Will arrange for overnight oxygen test while using CPAP  Follow up in 3 months with Dr. Halford Chessman

## 2016-03-18 ENCOUNTER — Encounter: Payer: Self-pay | Admitting: Physical Therapy

## 2016-03-18 ENCOUNTER — Ambulatory Visit: Payer: PPO | Attending: Family Medicine | Admitting: Physical Therapy

## 2016-03-18 DIAGNOSIS — Z79899 Other long term (current) drug therapy: Secondary | ICD-10-CM | POA: Diagnosis not present

## 2016-03-18 DIAGNOSIS — R296 Repeated falls: Secondary | ICD-10-CM | POA: Diagnosis not present

## 2016-03-18 DIAGNOSIS — R262 Difficulty in walking, not elsewhere classified: Secondary | ICD-10-CM | POA: Insufficient documentation

## 2016-03-18 DIAGNOSIS — M6281 Muscle weakness (generalized): Secondary | ICD-10-CM | POA: Diagnosis not present

## 2016-03-18 DIAGNOSIS — Z0000 Encounter for general adult medical examination without abnormal findings: Secondary | ICD-10-CM | POA: Diagnosis not present

## 2016-03-18 NOTE — Therapy (Signed)
[place] [address] [place], [place], [address] Phone: [phone]   Fax:  [phone]  Physical Therapy Evaluation  Patient Details  Name: Virginia Burns MRN: [id number] Date of Birth: February 18, 1946 Referring Provider: Eldridge Abrahams  Encounter Date: 03/18/2016      PT End of Session - 03/18/16 1420    Visit Number 1   Date for PT Re-Evaluation 05/18/16   PT Start Time 1344   PT Stop Time 1423   PT Time Calculation (min) 39 min   Activity Tolerance Patient tolerated treatment well   Behavior During Therapy [facility] for tasks assessed/performed      Past Medical History:  Diagnosis Date  . Allergic rhinitis   . Asthma   . Complication of anesthesia    Versed- hard time working - "dinging for days" - Colonoscopy  . COPD (chronic obstructive pulmonary disease) ([place])   . Diverticulosis of colon (without mention of hemorrhage)   . Emphysema of lung ([place])   . Family history of adverse reaction to anesthesia    Mother- hard to awaken and confusion.  . Family history of malignant neoplasm of gastrointestinal tract   . Gastroparesis   . GERD (gastroesophageal reflux disease)   . Hypertension   . On home oxygen therapy    oxygen at night  . Osteoporosis   . Other esophagitis   . Personal history of colonic polyps 10/07/2009   tubular adenoma  . Shortness of breath dyspnea   . Vitamin B12 deficiency     Past Surgical History:  Procedure Laterality Date  . ABDOMINAL HYSTERECTOMY    . CHOLECYSTECTOMY    . COLONOSCOPY WITH PROPOFOL N/A 11/21/2014   Procedure: COLONOSCOPY WITH PROPOFOL;  Surgeon: Irene Shipper, MD;  Location: [facility];  Service: Endoscopy;  Laterality: N/A;  . [place]  . LIGAMENT REPAIR Left 04/18/2013   Procedure: Triangular Fibrocartilage complex open repair;  Surgeon: Jolyn Nap, MD;  Location: [facility];  Service: Orthopedics;  Laterality: Left;  .  OOPHORECTOMY    . OPEN REDUCTION INTERNAL FIXATION (ORIF) DISTAL RADIAL FRACTURE Left 04/18/2013   Procedure: LEFT OPEN REDUCTION INTERNAL FIXATION (ORIF) DISTAL RADIAL FRACTURE;  Surgeon: Jolyn Nap, MD;  Location: [facility];  Service: Orthopedics;  Laterality: Left;    There were no vitals filed for this visit.       Subjective Assessment - 03/18/16 1351    Subjective Patient reports that she has had some falls and on two occasions she has hurt herself.  Her most recent fall was in August.  She reports that she feels unsteady at times with getting up and walking.   Pertinent History COPD   Limitations Walking   Patient Stated Goals be more steady   Currently in Pain? No/denies            [facility] PT Assessment - 03/18/16 0001      Assessment   Medical Diagnosis repeated falls   Referring Provider Eldridge Abrahams   Onset Date/Surgical Date 02/16/16   Prior Therapy no     Precautions   Precautions None     Balance Screen   Has the patient fallen in the past 6 months Yes   How many times? 1   Has the patient had a decrease in activity level because of a fear of falling?  No   Is the patient reluctant to leave their home because of a  fear of falling?  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, has to go up and down stairs   Leisure no exercise     Posture/Postural Control   Posture Comments fwd head, rounded shoulders     ROM / Strength   AROM / PROM / Strength AROM;Strength     AROM   Overall AROM Comments WFL's for the LE's     Strength   Overall Strength Comments 4-/5 for the LE's     Ambulation/Gait   Gait Comments no device, slow, decreased step length     Berg Balance Test   Sit to Stand Able to stand without using hands and stabilize independently   Standing  Unsupported Able to stand safely 2 minutes   Sitting with Back Unsupported but Feet Supported on Floor or Stool Able to sit safely and securely 2 minutes   Stand to Sit Sits safely with minimal use of hands   Transfers Able to transfer safely, minor use of hands   Standing Unsupported with Eyes Closed Able to stand 10 seconds with supervision   Standing Ubsupported with Feet Together Able to place feet together independently and stand 1 minute safely   From Standing, Reach Forward with Outstretched Arm Can reach forward >12 cm safely (5")   From Standing Position, Pick up Object from Floor Able to pick up shoe safely and easily   From Standing Position, Turn to Look Behind Over each Shoulder Looks behind one side only/other side shows less weight shift   Turn 360 Degrees Able to turn 360 degrees safely in 4 seconds or less   Standing Unsupported, Alternately Place Feet on Step/Stool Able to stand independently and complete 8 steps >20 seconds   Standing Unsupported, One Foot in Front Able to plae foot ahead of the other independently and hold 30 seconds   Standing on One Leg Tries to lift leg/unable to hold 3 seconds but remains standing independently   Total Score 48     Timed Up and Go Test   Normal TUG (seconds) 16                           PT Education - 03/18/16 1419    Education provided Yes   Education Details issued HEP for 4 way hip and side stepping   Person(s) Educated Patient   Methods Explanation;Demonstration;Handout   Comprehension Verbalized understanding;Returned demonstration;Verbal cues required          PT Short Term Goals - 03/18/16 1452      PT SHORT TERM GOAL #1   Title independent with initial HEP   Time 2   Period Weeks   Status New           PT Long Term Goals - 03/18/16 1452      PT LONG TERM GOAL #1   Title understand risk for falls in the home   Time 8   Period Weeks   Status New     PT LONG TERM GOAL #2   Title  decrease TUG time to 12 seconds   Time 8   Period Weeks   Status New     PT LONG TERM GOAL #3   Title increase Berg balance score to 50/56  Time 8   Period Weeks   Status New     PT LONG TERM GOAL #4   Title increase LE stregnth to 4+/5   Time 8   Period Weeks   Status New               Plan - 03/18/16 1421    Clinical Impression Statement Patient has 2 had 2 falls with injuries in the past, with 1 fall in the past 6 motnhs.  She reports that she is unsteady on her feet.  She reports that when she get up and goes she is unsteady, she takes small steps, her TUG was 16 seconds, her Merrilee Jansky was 48/56   Rehab Potential Good   PT Frequency 2x / week   PT Duration 8 weeks   PT Treatment/Interventions ADLs/Self Care Home Management;Gait training;Stair training;Functional mobility training;Patient/family education;Neuromuscular re-education;Balance training;Therapeutic exercise;Therapeutic activities;Manual techniques   PT Next Visit Plan Slowly add exercises for strength and balance   Consulted and Agree with Plan of Care Patient      Patient will benefit from skilled therapeutic intervention in order to improve the following deficits and impairments:  Abnormal gait, Cardiopulmonary status limiting activity, Decreased activity tolerance, Decreased balance, Decreased mobility, Decreased strength, Difficulty walking, Impaired flexibility  Visit Diagnosis: Repeated falls - Plan: PT plan of care cert/re-cert  Muscle weakness (generalized) - Plan: PT plan of care cert/re-cert  Difficulty in walking, not elsewhere classified - Plan: PT plan of care cert/re-cert      G-Codes - [phone]    Functional Assessment Tool Used foto 55% limitation   Functional Limitation Mobility: Walking and moving around   Mobility: Walking and Moving Around Current Status [phone]) At least 40 percent but less than 60 percent impaired, limited or restricted   Mobility: Walking and Moving Around Goal  Status [phone]) At least 40 percent but less than 60 percent impaired, limited or restricted       Problem List Patient Active Problem List   Diagnosis Date Noted  . Chronic respiratory failure ([place]) 03/04/2015  . Hx of colonic polyps   . Benign neoplasm of ascending colon   . Benign neoplasm of descending colon   . OSA (obstructive sleep apnea) 09/18/2013  . History of tobacco abuse 09/18/2013  . Benign essential tremor 05/22/2013  . COPD (chronic obstructive pulmonary disease) ([place]) 02/10/2013  . EROSIVE ESOPHAGITIS 10/12/2007  . HYPERTENSION 07/19/2007    Sumner Boast., PT 03/18/2016, 3:00 PM  [address] [place], [place], [address] Phone: [phone]   Fax:  [phone]  Name: Virginia Burns MRN: [id number] Date of Birth: [birth year]

## 2016-03-24 ENCOUNTER — Ambulatory Visit: Payer: PPO | Admitting: Physical Therapy

## 2016-03-24 ENCOUNTER — Encounter: Payer: Self-pay | Admitting: Physical Therapy

## 2016-03-24 DIAGNOSIS — R262 Difficulty in walking, not elsewhere classified: Secondary | ICD-10-CM

## 2016-03-24 DIAGNOSIS — R296 Repeated falls: Secondary | ICD-10-CM

## 2016-03-24 DIAGNOSIS — M6281 Muscle weakness (generalized): Secondary | ICD-10-CM

## 2016-03-24 NOTE — Therapy (Signed)
[place] [address] [place], [place], [address] Phone: [phone]   Fax:  [phone]  Physical Therapy Treatment  Patient Details  Name: Virginia Burns MRN: [id number] Date of Birth: 05/12/1946 Referring Provider: Eldridge Abrahams  Encounter Date: 03/24/2016      PT End of Session - 03/24/16 1510    Visit Number 2   Date for PT Re-Evaluation 05/18/16   PT Start Time 1430   PT Stop Time 1513   PT Time Calculation (min) 43 min   Activity Tolerance Patient tolerated treatment well   Behavior During Therapy [facility] for tasks assessed/performed      Past Medical History:  Diagnosis Date  . Allergic rhinitis   . Asthma   . Complication of anesthesia    Versed- hard time working - "dinging for days" - Colonoscopy  . COPD (chronic obstructive pulmonary disease) ([place])   . Diverticulosis of colon (without mention of hemorrhage)   . Emphysema of lung ([place])   . Family history of adverse reaction to anesthesia    Mother- hard to awaken and confusion.  . Family history of malignant neoplasm of gastrointestinal tract   . Gastroparesis   . GERD (gastroesophageal reflux disease)   . Hypertension   . On home oxygen therapy    oxygen at night  . Osteoporosis   . Other esophagitis   . Personal history of colonic polyps 10/07/2009   tubular adenoma  . Shortness of breath dyspnea   . Vitamin B12 deficiency     Past Surgical History:  Procedure Laterality Date  . ABDOMINAL HYSTERECTOMY    . CHOLECYSTECTOMY    . COLONOSCOPY WITH PROPOFOL N/A 11/21/2014   Procedure: COLONOSCOPY WITH PROPOFOL;  Surgeon: Irene Shipper, MD;  Location: [facility];  Service: Endoscopy;  Laterality: N/A;  . [place]  . LIGAMENT REPAIR Left 04/18/2013   Procedure: Triangular Fibrocartilage complex open repair;  Surgeon: Jolyn Nap, MD;  Location: [facility];  Service: Orthopedics;  Laterality: Left;  .  OOPHORECTOMY    . OPEN REDUCTION INTERNAL FIXATION (ORIF) DISTAL RADIAL FRACTURE Left 04/18/2013   Procedure: LEFT OPEN REDUCTION INTERNAL FIXATION (ORIF) DISTAL RADIAL FRACTURE;  Surgeon: Jolyn Nap, MD;  Location: [facility];  Service: Orthopedics;  Laterality: Left;    There were no vitals filed for this visit.      Subjective Assessment - 03/24/16 1428    Subjective "Ive been doing a couple of things Legrand Como told me to do at home "   Currently in Pain? No/denies   Multiple Pain Sites No                         [facility] Adult PT Treatment/Exercise - 03/24/16 0001      High Level Balance   High Level Balance Activities Side stepping;Backward walking   High Level Balance Comments Standing ball toss      Exercises   Exercises Lumbar;Knee/Hip     Lumbar Exercises: Machines for Strengthening   Cybex Knee Extension 5lb 2x10   Cybex Knee Flexion 20lb 2x10   Leg Press 20lb 2x10     Lumbar Exercises: Seated   Sit to Stand 10 reps  x2   Other Seated Lumbar Exercises Tband Rows blue 2x15     Knee/Hip Exercises: Aerobic   Nustep NuStep L4 x6 min  PT Short Term Goals - 03/18/16 1452      PT SHORT TERM GOAL #1   Title independent with initial HEP   Time 2   Period Weeks   Status New           PT Long Term Goals - 03/18/16 1452      PT LONG TERM GOAL #1   Title understand risk for falls in the home   Time 8   Period Weeks   Status New     PT LONG TERM GOAL #2   Title decrease TUG time to 12 seconds   Time 8   Period Weeks   Status New     PT LONG TERM GOAL #3   Title increase Berg balance score to 50/56   Time 8   Period Weeks   Status New     PT LONG TERM GOAL #4   Title increase LE stregnth to 4+/5   Time 8   Period Weeks   Status New               Plan - 03/24/16 1511    Clinical Impression Statement Pt tolerated a initial progression to exercise well. No reports of increase  pain throughout treatment.     Rehab Potential Good   PT Frequency 2x / week   PT Duration 8 weeks   PT Treatment/Interventions ADLs/Self Care Home Management;Gait training;Stair training;Functional mobility training;Patient/family education;Neuromuscular re-education;Balance training;Therapeutic exercise;Therapeutic activities;Manual techniques      Patient will benefit from skilled therapeutic intervention in order to improve the following deficits and impairments:  Abnormal gait, Cardiopulmonary status limiting activity, Decreased activity tolerance, Decreased balance, Decreased mobility, Decreased strength, Difficulty walking, Impaired flexibility  Visit Diagnosis: Muscle weakness (generalized)  Difficulty in walking, not elsewhere classified  Repeated falls     Problem List Patient Active Problem List   Diagnosis Date Noted  . Chronic respiratory failure ([place]) 03/04/2015  . Hx of colonic polyps   . Benign neoplasm of ascending colon   . Benign neoplasm of descending colon   . OSA (obstructive sleep apnea) 09/18/2013  . History of tobacco abuse 09/18/2013  . Benign essential tremor 05/22/2013  . COPD (chronic obstructive pulmonary disease) ([place]) 02/10/2013  . EROSIVE ESOPHAGITIS 10/12/2007  . HYPERTENSION 07/19/2007    Scot Jun, PTA  03/24/2016, 3:12 PM  [address] [place], [place], [address] Phone: [phone]   Fax:  [phone]  Name: Virginia Burns MRN: [id number] Date of Birth: February 02, 1946

## 2016-03-26 ENCOUNTER — Encounter: Payer: Self-pay | Admitting: Physical Therapy

## 2016-03-26 ENCOUNTER — Ambulatory Visit: Payer: PPO | Admitting: Physical Therapy

## 2016-03-26 DIAGNOSIS — R296 Repeated falls: Secondary | ICD-10-CM

## 2016-03-26 DIAGNOSIS — M6281 Muscle weakness (generalized): Secondary | ICD-10-CM

## 2016-03-26 DIAGNOSIS — R262 Difficulty in walking, not elsewhere classified: Secondary | ICD-10-CM

## 2016-03-26 NOTE — Therapy (Signed)
[address] [place], [place], [address] Phone: [phone]   Fax:  [phone]  Physical Therapy Treatment  Patient Details  Name: Virginia Burns MRN: [id number] Date of Birth: Jul 03, 1945 Referring Provider: Eldridge Abrahams  Encounter Date: 03/26/2016      PT End of Session - 03/26/16 1556    Visit Number 3   Date for PT Re-Evaluation 05/18/16   PT Start Time 0325   PT Stop Time 0410   PT Time Calculation (min) 45 min   Activity Tolerance Patient tolerated treatment well;No increased pain   Behavior During Therapy WFL for tasks assessed/performed      Past Medical History:  Diagnosis Date  . Allergic rhinitis   . Asthma   . Complication of anesthesia    Versed- hard time working - "dinging for days" - Colonoscopy  . COPD (chronic obstructive pulmonary disease) ([place])   . Diverticulosis of colon (without mention of hemorrhage)   . Emphysema of lung ([place])   . Family history of adverse reaction to anesthesia    Mother- hard to awaken and confusion.  . Family history of malignant neoplasm of gastrointestinal tract   . Gastroparesis   . GERD (gastroesophageal reflux disease)   . Hypertension   . On home oxygen therapy    oxygen at night  . Osteoporosis   . Other esophagitis   . Personal history of colonic polyps 10/07/2009   tubular adenoma  . Shortness of breath dyspnea   . Vitamin B12 deficiency     Past Surgical History:  Procedure Laterality Date  . ABDOMINAL HYSTERECTOMY    . CHOLECYSTECTOMY    . COLONOSCOPY WITH PROPOFOL N/A 11/21/2014   Procedure: COLONOSCOPY WITH PROPOFOL;  Surgeon: Irene Shipper, MD;  Location: [facility];  Service: Endoscopy;  Laterality: N/A;  . [place]  . LIGAMENT REPAIR Left 04/18/2013   Procedure: Triangular Fibrocartilage complex open repair;  Surgeon: Jolyn Nap, MD;  Location: [facility];  Service: Orthopedics;  Laterality:  Left;  . OOPHORECTOMY    . OPEN REDUCTION INTERNAL FIXATION (ORIF) DISTAL RADIAL FRACTURE Left 04/18/2013   Procedure: LEFT OPEN REDUCTION INTERNAL FIXATION (ORIF) DISTAL RADIAL FRACTURE;  Surgeon: Jolyn Nap, MD;  Location: [facility];  Service: Orthopedics;  Laterality: Left;    There were no vitals filed for this visit.      Subjective Assessment - 03/26/16 1523    Subjective Patient states she is "doing okay, just a little sore". She reports that she has been on her feet for the majority of the day and says her feet are hurting.   Pertinent History COPD   Limitations Walking   Patient Stated Goals be more steady   Currently in Pain? Yes   Pain Score 4    Pain Location Foot   Pain Orientation Right;Left   Multiple Pain Sites No                         [facility] Adult PT Treatment/Exercise - 03/26/16 0001      Ambulation/Gait   Ambulation/Gait --     High Level Balance   High Level Balance Comments Resisted sidestepping, wedding walk  yellow tband     Neuro Re-ed    Neuro Re-ed Details  airex static stand, staggered stand. 4x 30 secs      Lumbar Exercises: Aerobic   Stationary Bike NuStep  6 minutes     Knee/Hip Exercises: Machines for Strengthening   Cybex Knee Extension 15 lbs, 2x10   Cybex Knee Flexion 25 lbs, 2x10     Knee/Hip Exercises: Standing   Hip Flexion AROM;Stengthening;Both;2 sets;20 reps  toe taps on 6" step   Hip Abduction Right;Left;Knee straight;1 set   Hip Extension 1 set;Left;Right;Stengthening;AROM;10 reps     Manual Therapy   Manual Therapy Passive ROM   Manual therapy comments HS, gastroc, piriformis stretch                  PT Short Term Goals - 03/18/16 1452      PT SHORT TERM GOAL #1   Title independent with initial HEP   Time 2   Period Weeks   Status New           PT Long Term Goals - 03/18/16 1452      PT LONG TERM GOAL #1   Title understand risk for falls in the home   Time 8    Period Weeks   Status New     PT LONG TERM GOAL #2   Title decrease TUG time to 12 seconds   Time 8   Period Weeks   Status New     PT LONG TERM GOAL #3   Title increase Berg balance score to 50/56   Time 8   Period Weeks   Status New     PT LONG TERM GOAL #4   Title increase LE stregnth to 4+/5   Time 8   Period Weeks   Status New               Plan - 03/26/16 1558    Clinical Impression Statement Pt tolerated treatment well and was able to complete all exercises. Pt was able to complete airex balance training with contact guard assist. Patient stumbled a couple of times however she was able to use ankle., knee and hip strategy to keep her balance.     Rehab Potential Good   PT Frequency 2x / week   PT Duration 8 weeks   PT Treatment/Interventions ADLs/Self Care Home Management;Gait training;Stair training;Functional mobility training;Patient/family education;Neuromuscular re-education;Balance training;Therapeutic exercise;Therapeutic activities;Manual techniques   PT Next Visit Plan hip strengthening, balance training, aerobic training   Consulted and Agree with Plan of Care Patient      Patient will benefit from skilled therapeutic intervention in order to improve the following deficits and impairments:  Abnormal gait, Cardiopulmonary status limiting activity, Decreased activity tolerance, Decreased balance, Decreased mobility, Decreased strength, Difficulty walking, Impaired flexibility  Visit Diagnosis: Muscle weakness (generalized)  Difficulty in walking, not elsewhere classified  Repeated falls     Problem List Patient Active Problem List   Diagnosis Date Noted  . Chronic respiratory failure ([place]) 03/04/2015  . Hx of colonic polyps   . Benign neoplasm of ascending colon   . Benign neoplasm of descending colon   . OSA (obstructive sleep apnea) 09/18/2013  . History of tobacco abuse 09/18/2013  . Benign essential tremor 05/22/2013  . COPD  (chronic obstructive pulmonary disease) ([place]) 02/10/2013  . EROSIVE ESOPHAGITIS 10/12/2007  . HYPERTENSION 07/19/2007    Toy Baker, SPT 03/26/2016, 4:14 PM  [address] [place], [place], [address] Phone: [phone]   Fax:  [phone]  Name: Virginia Burns MRN: [id number] Date of Birth: 08-17-1945

## 2016-03-27 DIAGNOSIS — G4733 Obstructive sleep apnea (adult) (pediatric): Secondary | ICD-10-CM | POA: Diagnosis not present

## 2016-03-27 DIAGNOSIS — J449 Chronic obstructive pulmonary disease, unspecified: Secondary | ICD-10-CM | POA: Diagnosis not present

## 2016-03-31 ENCOUNTER — Ambulatory Visit: Payer: PPO | Admitting: Physical Therapy

## 2016-03-31 DIAGNOSIS — R296 Repeated falls: Secondary | ICD-10-CM

## 2016-03-31 DIAGNOSIS — R262 Difficulty in walking, not elsewhere classified: Secondary | ICD-10-CM

## 2016-03-31 DIAGNOSIS — M6281 Muscle weakness (generalized): Secondary | ICD-10-CM

## 2016-03-31 NOTE — Therapy (Signed)
[address] [place], [place], [address] Phone: [phone]   Fax:  [phone]  Physical Therapy Treatment  Patient Details  Name: Virginia Burns MRN: [id number] Date of Birth: [birth year] Referring Provider: Eldridge Abrahams  Encounter Date: 03/31/2016      PT End of Session - 03/31/16 0803    Visit Number 4   Date for PT Re-Evaluation 05/18/16   PT Start Time 0755   PT Stop Time 0840   PT Time Calculation (min) 45 min   Activity Tolerance Patient tolerated treatment well;No increased pain   Behavior During Therapy WFL for tasks assessed/performed      Past Medical History:  Diagnosis Date  . Allergic rhinitis   . Asthma   . Complication of anesthesia    Versed- hard time working - "dinging for days" - Colonoscopy  . COPD (chronic obstructive pulmonary disease) ([place])   . Diverticulosis of colon (without mention of hemorrhage)   . Emphysema of lung ([place])   . Family history of adverse reaction to anesthesia    Mother- hard to awaken and confusion.  . Family history of malignant neoplasm of gastrointestinal tract   . Gastroparesis   . GERD (gastroesophageal reflux disease)   . Hypertension   . On home oxygen therapy    oxygen at night  . Osteoporosis   . Other esophagitis   . Personal history of colonic polyps 10/07/2009   tubular adenoma  . Shortness of breath dyspnea   . Vitamin B12 deficiency     Past Surgical History:  Procedure Laterality Date  . ABDOMINAL HYSTERECTOMY    . CHOLECYSTECTOMY    . COLONOSCOPY WITH PROPOFOL N/A 11/21/2014   Procedure: COLONOSCOPY WITH PROPOFOL;  Surgeon: Irene Shipper, MD;  Location: [facility];  Service: Endoscopy;  Laterality: N/A;  . [place]  . LIGAMENT REPAIR Left 04/18/2013   Procedure: Triangular Fibrocartilage complex open repair;  Surgeon: Jolyn Nap, MD;  Location: [facility];  Service: Orthopedics;   Laterality: Left;  . OOPHORECTOMY    . OPEN REDUCTION INTERNAL FIXATION (ORIF) DISTAL RADIAL FRACTURE Left 04/18/2013   Procedure: LEFT OPEN REDUCTION INTERNAL FIXATION (ORIF) DISTAL RADIAL FRACTURE;  Surgeon: Jolyn Nap, MD;  Location: [facility];  Service: Orthopedics;  Laterality: Left;    There were no vitals filed for this visit.      Subjective Assessment - 03/31/16 0755    Subjective Patient states she is not having any pain. She states she was "a little sore" throuhg her shoulders after the last session.   Pertinent History COPD   Limitations Walking   Patient Stated Goals be more steady   Currently in Pain? No/denies                         [facility] Adult PT Treatment/Exercise - 03/31/16 0001      High Level Balance   High Level Balance Activities Side stepping;Other (comment)  wedding walk   High Level Balance Comments yellow tband     Neuro Re-ed    Neuro Re-ed Details  airex static standing, airex tandem standing  3x45 secs, 2x30 secs each     Lumbar Exercises: Aerobic   Stationary Bike NuStep 6 minuts     Knee/Hip Exercises: Stretches   Gastroc Stretch 3 reps;30 seconds     Knee/Hip Exercises: Machines for Strengthening   Cybex Knee Extension  10 lbs, 3x10   Cybex Knee Flexion 20 lbs, 3x10     Knee/Hip Exercises: Standing   Other Standing Knee Exercises sit to satnd, 6 lbs, 2x10     Manual Therapy   Manual Therapy Passive ROM   Manual therapy comments HS, piriformis, gastroc,                   PT Short Term Goals - 03/26/16 1650      PT SHORT TERM GOAL #1   Title independent with initial HEP   Status Achieved           PT Long Term Goals - 03/18/16 1452      PT LONG TERM GOAL #1   Title understand risk for falls in the home   Time 8   Period Weeks   Status New     PT LONG TERM GOAL #2   Title decrease TUG time to 12 seconds   Time 8   Period Weeks   Status New     PT LONG TERM GOAL #3    Title increase Berg balance score to 50/56   Time 8   Period Weeks   Status New     PT LONG TERM GOAL #4   Title increase LE stregnth to 4+/5   Time 8   Period Weeks   Status New               Plan - 03/31/16 0840    Clinical Impression Statement Patient tolerated treatment well and was able to complete all exercises. She did not report any increase in pain. Patient was able to use hip and ankle strategy to stay on balance with airex exercises. Continue to progress pt as tolerated.    Rehab Potential Good   PT Frequency 2x / week   PT Duration 8 weeks   PT Treatment/Interventions ADLs/Self Care Home Management;Gait training;Stair training;Functional mobility training;Patient/family education;Neuromuscular re-education;Balance training;Therapeutic exercise;Therapeutic activities;Manual techniques   PT Next Visit Plan hip strengthening, balance training, aerobic training   Consulted and Agree with Plan of Care Patient      Patient will benefit from skilled therapeutic intervention in order to improve the following deficits and impairments:  Abnormal gait, Cardiopulmonary status limiting activity, Decreased activity tolerance, Decreased balance, Decreased mobility, Decreased strength, Difficulty walking, Impaired flexibility  Visit Diagnosis: Muscle weakness (generalized)  Difficulty in walking, not elsewhere classified  Repeated falls     Problem List Patient Active Problem List   Diagnosis Date Noted  . Chronic respiratory failure ([place]) 03/04/2015  . Hx of colonic polyps   . Benign neoplasm of ascending colon   . Benign neoplasm of descending colon   . OSA (obstructive sleep apnea) 09/18/2013  . History of tobacco abuse 09/18/2013  . Benign essential tremor 05/22/2013  . COPD (chronic obstructive pulmonary disease) ([place]) 02/10/2013  . EROSIVE ESOPHAGITIS 10/12/2007  . HYPERTENSION 07/19/2007    Toy Baker, SPT 03/31/2016, 8:47 AM  [address] [place], [place], [address] Phone: [phone]   Fax:  [phone]  Name: Virginia Burns MRN: [id number] Date of Birth: [birth year]

## 2016-04-06 ENCOUNTER — Ambulatory Visit: Payer: PPO | Admitting: Physical Therapy

## 2016-04-06 ENCOUNTER — Encounter: Payer: Self-pay | Admitting: Physical Therapy

## 2016-04-06 DIAGNOSIS — M6281 Muscle weakness (generalized): Secondary | ICD-10-CM

## 2016-04-06 DIAGNOSIS — R296 Repeated falls: Secondary | ICD-10-CM

## 2016-04-06 DIAGNOSIS — R262 Difficulty in walking, not elsewhere classified: Secondary | ICD-10-CM

## 2016-04-06 NOTE — Therapy (Signed)
[address] [place], [place], [address] Phone: [phone]   Fax:  [phone]  Physical Therapy Treatment  Patient Details  Name: Virginia Burns MRN: [id number] Date of Birth: 1945/06/21 Referring Provider: Eldridge Abrahams  Encounter Date: 04/06/2016      PT End of Session - 04/06/16 1134    Visit Number 5   Date for PT Re-Evaluation 05/18/16   PT Start Time 1055   PT Stop Time 1135   PT Time Calculation (min) 40 min      Past Medical History:  Diagnosis Date  . Allergic rhinitis   . Asthma   . Complication of anesthesia    Versed- hard time working - "dinging for days" - Colonoscopy  . COPD (chronic obstructive pulmonary disease) ([place])   . Diverticulosis of colon (without mention of hemorrhage)   . Emphysema of lung ([place])   . Family history of adverse reaction to anesthesia    Mother- hard to awaken and confusion.  . Family history of malignant neoplasm of gastrointestinal tract   . Gastroparesis   . GERD (gastroesophageal reflux disease)   . Hypertension   . On home oxygen therapy    oxygen at night  . Osteoporosis   . Other esophagitis   . Personal history of colonic polyps 10/07/2009   tubular adenoma  . Shortness of breath dyspnea   . Vitamin B12 deficiency     Past Surgical History:  Procedure Laterality Date  . ABDOMINAL HYSTERECTOMY    . CHOLECYSTECTOMY    . COLONOSCOPY WITH PROPOFOL N/A 11/21/2014   Procedure: COLONOSCOPY WITH PROPOFOL;  Surgeon: Irene Shipper, MD;  Location: [facility];  Service: Endoscopy;  Laterality: N/A;  . [place]  . LIGAMENT REPAIR Left 04/18/2013   Procedure: Triangular Fibrocartilage complex open repair;  Surgeon: Jolyn Nap, MD;  Location: [facility];  Service: Orthopedics;  Laterality: Left;  . OOPHORECTOMY    . OPEN REDUCTION INTERNAL FIXATION (ORIF) DISTAL RADIAL FRACTURE Left 04/18/2013   Procedure: LEFT OPEN  REDUCTION INTERNAL FIXATION (ORIF) DISTAL RADIAL FRACTURE;  Surgeon: Jolyn Nap, MD;  Location: [facility];  Service: Orthopedics;  Laterality: Left;    There were no vitals filed for this visit.      Subjective Assessment - 04/06/16 1059    Subjective tired but no pain. worked all weekend so no time for HEP   Currently in Pain? No/denies                         [facility] Adult PT Treatment/Exercise - 04/06/16 0001      High Level Balance   High Level Balance Comments ball toss on airex     Lumbar Exercises: Aerobic   Stationary Bike Nustep L 4 6 min     Lumbar Exercises: Machines for Strengthening   Cybex Knee Extension 5lb 2x10   Cybex Knee Flexion 20lb 2x10   Leg Press 30# 2x10     Lumbar Exercises: Standing   Other Standing Lumbar Exercises 3# LE HHA marching fwd and back 20 feet 2 times each  3# LE HHA side stepping 20 feet 2 times each   Other Standing Lumbar Exercises 3# 6 inch step touch alt 20 2 sets     Knee/Hip Exercises: Standing   Heel Raises 15 reps  3# HHA   Heel Raises Limitations 15 reps 3# HHA toe raises  Knee/Hip Exercises: Seated   Abduction/Adduction  Strengthening;Both;2 sets;10 reps;Weights   Abd/Adduction Weights 3 lbs.   Sit to Sand 10 reps;without UE support  with ball toss                  PT Short Term Goals - 03/26/16 1650      PT SHORT TERM GOAL #1   Title independent with initial HEP   Status Achieved           PT Long Term Goals - 04/06/16 1131      PT LONG TERM GOAL #1   Title understand risk for falls in the home   Status Achieved     PT LONG TERM GOAL #2   Title decrease TUG time to 12 seconds   Baseline TUG 9 sec   Status Achieved     PT LONG TERM GOAL #3   Title increase Berg balance score to 50/56   Status On-going     PT LONG TERM GOAL #4   Title increase LE stregnth to 4+/5   Status On-going               Plan - 04/06/16 1134    Clinical Impression  Statement TUG gosl met,understanding fall risk goal met. Tolerated ther ex well, some LOB with high level balance activities.   PT Next Visit Plan hip strengthening, balance training, aerobic training- CHECK BERG      Patient will benefit from skilled therapeutic intervention in order to improve the following deficits and impairments:  Abnormal gait, Cardiopulmonary status limiting activity, Decreased activity tolerance, Decreased balance, Decreased mobility, Decreased strength, Difficulty walking, Impaired flexibility  Visit Diagnosis: Muscle weakness (generalized)  Difficulty in walking, not elsewhere classified  Repeated falls     Problem List Patient Active Problem List   Diagnosis Date Noted  . Chronic respiratory failure ([place]) 03/04/2015  . Hx of colonic polyps   . Benign neoplasm of ascending colon   . Benign neoplasm of descending colon   . OSA (obstructive sleep apnea) 09/18/2013  . History of tobacco abuse 09/18/2013  . Benign essential tremor 05/22/2013  . COPD (chronic obstructive pulmonary disease) ([place]) 02/10/2013  . EROSIVE ESOPHAGITIS 10/12/2007  . HYPERTENSION 07/19/2007    PAYSEUR,ANGIE PTA 04/06/2016, 11:35 AM  [address] [place], [place], [address] Phone: [phone]   Fax:  [phone]  Name: Virginia Burns MRN: [id number] Date of Birth: 1946-04-22

## 2016-04-14 ENCOUNTER — Ambulatory Visit: Payer: PPO | Admitting: Physical Therapy

## 2016-04-14 ENCOUNTER — Encounter: Payer: Self-pay | Admitting: Physical Therapy

## 2016-04-14 DIAGNOSIS — M6281 Muscle weakness (generalized): Secondary | ICD-10-CM

## 2016-04-14 DIAGNOSIS — R296 Repeated falls: Secondary | ICD-10-CM

## 2016-04-14 DIAGNOSIS — R262 Difficulty in walking, not elsewhere classified: Secondary | ICD-10-CM

## 2016-04-14 NOTE — Therapy (Signed)
[address] [place], [place], [address] Phone: [phone]   Fax:  [phone]  Physical Therapy Treatment  Patient Details  Name: Virginia Burns MRN: [id number] Date of Birth: 1945/06/15 Referring Provider: Eldridge Abrahams  Encounter Date: 04/14/2016      PT End of Session - 04/14/16 1341    Visit Number 6   Date for PT Re-Evaluation 05/18/16   PT Start Time 0100   PT Stop Time 0143   PT Time Calculation (min) 43 min   Activity Tolerance Patient tolerated treatment well;No increased pain   Behavior During Therapy WFL for tasks assessed/performed      Past Medical History:  Diagnosis Date  . Allergic rhinitis   . Asthma   . Complication of anesthesia    Versed- hard time working - "dinging for days" - Colonoscopy  . COPD (chronic obstructive pulmonary disease) ([place])   . Diverticulosis of colon (without mention of hemorrhage)   . Emphysema of lung ([place])   . Family history of adverse reaction to anesthesia    Mother- hard to awaken and confusion.  . Family history of malignant neoplasm of gastrointestinal tract   . Gastroparesis   . GERD (gastroesophageal reflux disease)   . Hypertension   . On home oxygen therapy    oxygen at night  . Osteoporosis   . Other esophagitis   . Personal history of colonic polyps 10/07/2009   tubular adenoma  . Shortness of breath dyspnea   . Vitamin B12 deficiency     Past Surgical History:  Procedure Laterality Date  . ABDOMINAL HYSTERECTOMY    . CHOLECYSTECTOMY    . COLONOSCOPY WITH PROPOFOL N/A 11/21/2014   Procedure: COLONOSCOPY WITH PROPOFOL;  Surgeon: Irene Shipper, MD;  Location: [facility];  Service: Endoscopy;  Laterality: N/A;  . [place]  . LIGAMENT REPAIR Left 04/18/2013   Procedure: Triangular Fibrocartilage complex open repair;  Surgeon: Jolyn Nap, MD;  Location: [facility];  Service: Orthopedics;   Laterality: Left;  . OOPHORECTOMY    . OPEN REDUCTION INTERNAL FIXATION (ORIF) DISTAL RADIAL FRACTURE Left 04/18/2013   Procedure: LEFT OPEN REDUCTION INTERNAL FIXATION (ORIF) DISTAL RADIAL FRACTURE;  Surgeon: Jolyn Nap, MD;  Location: [facility];  Service: Orthopedics;  Laterality: Left;    There were no vitals filed for this visit.      Subjective Assessment - 04/14/16 1300    Subjective Patient states she is doing well and is not experiencing any pain today.   Pertinent History COPD   Limitations Walking   Currently in Pain? No/denies                         [facility] Adult PT Treatment/Exercise - 04/14/16 0001      Ambulation/Gait   Ambulation/Gait Yes   Gait Comments Forward step overs, side step overs   black foam roll, x20 each     Exercises   Exercises Shoulder;Knee/Hip;Lumbar     Lumbar Exercises: Aerobic   Stationary Bike Nustep lvl 6, 8 minutes     Knee/Hip Exercises: Stretches   Active Hamstring Stretch 2 reps;Left;Right;30 seconds   Gastroc Stretch 2 reps;30 seconds;Left;Right     Knee/Hip Exercises: Machines for Strengthening   Cybex Knee Extension 20 lbs, 2x10   Cybex Knee Flexion 35 lbs, 2x10     Shoulder Exercises: Seated   Retraction Strengthening;AROM;Both;10 reps;Weights  Retraction Weight (lbs) 15   Other Seated Exercises Lat pulldown, 2x10, 10 lbs                PT Education - 04/14/16 1343    Education provided No          PT Short Term Goals - 03/26/16 1650      PT SHORT TERM GOAL #1   Title independent with initial HEP   Status Achieved           PT Long Term Goals - 04/06/16 1131      PT LONG TERM GOAL #1   Title understand risk for falls in the home   Status Achieved     PT LONG TERM GOAL #2   Title decrease TUG time to 12 seconds   Baseline TUG 9 sec   Status Achieved     PT LONG TERM GOAL #3   Title increase Berg balance score to 50/56   Status On-going     PT LONG  TERM GOAL #4   Title increase LE stregnth to 4+/5   Status On-going               Plan - 04/14/16 1343    Clinical Impression Statement Patient tolerated treatment well and was able to complete all exercises without increased sympotms. Pt did well with step over exercsies. She needed verbal cueing to slow down a couple of times due to the fact that her form was wavering and she began to kick the foam roll. When pt was focussed, she was able to perfrom the exercise well. Contrinue to progress per pt tolerance.   Rehab Potential Good   PT Frequency 2x / week   PT Duration 8 weeks   PT Treatment/Interventions ADLs/Self Care Home Management;Gait training;Stair training;Functional mobility training;Patient/family education;Neuromuscular re-education;Balance training;Therapeutic exercise;Therapeutic activities;Manual techniques   PT Next Visit Plan hip strengthening, balance training, aerobic training- CHECK BERG   Consulted and Agree with Plan of Care Patient      Patient will benefit from skilled therapeutic intervention in order to improve the following deficits and impairments:  Abnormal gait, Cardiopulmonary status limiting activity, Decreased activity tolerance, Decreased balance, Decreased mobility, Decreased strength, Difficulty walking, Impaired flexibility  Visit Diagnosis: Muscle weakness (generalized)  Difficulty in walking, not elsewhere classified  Repeated falls     Problem List Patient Active Problem List   Diagnosis Date Noted  . Chronic respiratory failure ([place]) 03/04/2015  . Hx of colonic polyps   . Benign neoplasm of ascending colon   . Benign neoplasm of descending colon   . OSA (obstructive sleep apnea) 09/18/2013  . History of tobacco abuse 09/18/2013  . Benign essential tremor 05/22/2013  . COPD (chronic obstructive pulmonary disease) ([place]) 02/10/2013  . EROSIVE ESOPHAGITIS 10/12/2007  . HYPERTENSION 07/19/2007    Toy Baker, SPT 04/14/2016,  1:48 PM  [address] [address] [place], [place], [address] Phone: [phone]   Fax:  [phone]  Name: Virginia Burns MRN: [id number] Date of Birth: 05/17/1946

## 2016-04-16 ENCOUNTER — Encounter: Payer: Self-pay | Admitting: Physical Therapy

## 2016-04-16 ENCOUNTER — Ambulatory Visit: Payer: PPO | Admitting: Physical Therapy

## 2016-04-16 DIAGNOSIS — R262 Difficulty in walking, not elsewhere classified: Secondary | ICD-10-CM

## 2016-04-16 DIAGNOSIS — R296 Repeated falls: Secondary | ICD-10-CM

## 2016-04-16 DIAGNOSIS — M6281 Muscle weakness (generalized): Secondary | ICD-10-CM

## 2016-04-16 NOTE — Therapy (Signed)
[address] [place], [place], [address] Phone: [phone]   Fax:  [phone]  Physical Therapy Treatment  Patient Details  Name: Virginia Burns MRN: [id number] Date of Birth: [birth year] Referring Provider: Eldridge Abrahams  Encounter Date: 04/16/2016      PT End of Session - 04/16/16 1343    Visit Number 7   Date for PT Re-Evaluation 05/18/16   PT Start Time 1300   PT Stop Time 1345   PT Time Calculation (min) 45 min   Activity Tolerance Patient tolerated treatment well;No increased pain   Behavior During Therapy WFL for tasks assessed/performed      Past Medical History:  Diagnosis Date  . Allergic rhinitis   . Asthma   . Complication of anesthesia    Versed- hard time working - "dinging for days" - Colonoscopy  . COPD (chronic obstructive pulmonary disease) ([place])   . Diverticulosis of colon (without mention of hemorrhage)   . Emphysema of lung ([place])   . Family history of adverse reaction to anesthesia    Mother- hard to awaken and confusion.  . Family history of malignant neoplasm of gastrointestinal tract   . Gastroparesis   . GERD (gastroesophageal reflux disease)   . Hypertension   . On home oxygen therapy    oxygen at night  . Osteoporosis   . Other esophagitis   . Personal history of colonic polyps 10/07/2009   tubular adenoma  . Shortness of breath dyspnea   . Vitamin B12 deficiency     Past Surgical History:  Procedure Laterality Date  . ABDOMINAL HYSTERECTOMY    . CHOLECYSTECTOMY    . COLONOSCOPY WITH PROPOFOL N/A 11/21/2014   Procedure: COLONOSCOPY WITH PROPOFOL;  Surgeon: Irene Shipper, MD;  Location: [facility];  Service: Endoscopy;  Laterality: N/A;  . [place]  . LIGAMENT REPAIR Left 04/18/2013   Procedure: Triangular Fibrocartilage complex open repair;  Surgeon: Jolyn Nap, MD;  Location: [facility];  Service: Orthopedics;   Laterality: Left;  . OOPHORECTOMY    . OPEN REDUCTION INTERNAL FIXATION (ORIF) DISTAL RADIAL FRACTURE Left 04/18/2013   Procedure: LEFT OPEN REDUCTION INTERNAL FIXATION (ORIF) DISTAL RADIAL FRACTURE;  Surgeon: Jolyn Nap, MD;  Location: [facility];  Service: Orthopedics;  Laterality: Left;    There were no vitals filed for this visit.      Subjective Assessment - 04/16/16 1301    Subjective "Im doing fine, No pain"   Currently in Pain? No/denies   Pain Score 0-No pain                         [facility] Adult PT Treatment/Exercise - 04/16/16 0001      High Level Balance   High Level Balance Comments ball toss with sit to stand x10, Negotiating over objects' side stepping over objects     Lumbar Exercises: Stretches   Passive Hamstring Stretch 4 reps;10 seconds   Piriformis Stretch 3 reps;10 seconds     Lumbar Exercises: Aerobic   Stationary Bike Nustep lvl 6, 8 minutes     Lumbar Exercises: Machines for Strengthening   Leg Press 30# 2x10     Lumbar Exercises: Standing   Row 10 reps;Theraband  x2   Theraband Level (Row) Level 3 (Green)   Shoulder Extension 10 reps;Theraband  x2   Theraband Level (Shoulder Extension) Level 2 (Red)  Lumbar Exercises: Seated   Sit to Stand 10 reps  LE on Airex x2      Knee/Hip Exercises: Machines for Strengthening   Cybex Knee Extension 20 lbs, 2x10   Cybex Knee Flexion 35 lbs, 2x10                  PT Short Term Goals - 03/26/16 1650      PT SHORT TERM GOAL #1   Title independent with initial HEP   Status Achieved           PT Long Term Goals - 04/16/16 1344      PT LONG TERM GOAL #1   Status Achieved     PT LONG TERM GOAL #2   Title decrease TUG time to 12 seconds   Status Achieved     PT LONG TERM GOAL #3   Title increase Berg balance score to 50/56   Status On-going     PT LONG TERM GOAL #4   Title increase LE stregnth to 4+/5   Status On-going                Plan - 04/16/16 1343    Clinical Impression Statement Pt able to perform all of today's exercises. Pt does demo some instability when side stepping over objects. Pt reports no pain, just concern about her balance.   PT Frequency 2x / week   PT Duration 8 weeks   PT Treatment/Interventions ADLs/Self Care Home Management;Gait training;Stair training;Functional mobility training;Patient/family education;Neuromuscular re-education;Balance training;Therapeutic exercise;Therapeutic activities;Manual techniques   PT Next Visit Plan hip strengthening, balance training, aerobic training- CHECK BERG      Patient will benefit from skilled therapeutic intervention in order to improve the following deficits and impairments:  Abnormal gait, Cardiopulmonary status limiting activity, Decreased activity tolerance, Decreased balance, Decreased mobility, Decreased strength, Difficulty walking, Impaired flexibility  Visit Diagnosis: Muscle weakness (generalized)  Difficulty in walking, not elsewhere classified  Repeated falls     Problem List Patient Active Problem List   Diagnosis Date Noted  . Chronic respiratory failure ([place]) 03/04/2015  . Hx of colonic polyps   . Benign neoplasm of ascending colon   . Benign neoplasm of descending colon   . OSA (obstructive sleep apnea) 09/18/2013  . History of tobacco abuse 09/18/2013  . Benign essential tremor 05/22/2013  . COPD (chronic obstructive pulmonary disease) ([place]) 02/10/2013  . EROSIVE ESOPHAGITIS 10/12/2007  . HYPERTENSION 07/19/2007    Scot Jun 04/16/2016, 1:45 PM  [address] [place], [place], [address] Phone: [phone]   Fax:  [phone]  Name: Virginia Burns MRN: [id number] Date of Birth: 25-Mar-1946

## 2016-04-21 ENCOUNTER — Ambulatory Visit: Payer: PPO | Admitting: Physical Therapy

## 2016-04-22 ENCOUNTER — Encounter: Payer: Self-pay | Admitting: Physical Therapy

## 2016-04-22 ENCOUNTER — Ambulatory Visit: Payer: PPO | Attending: Family Medicine | Admitting: Physical Therapy

## 2016-04-22 DIAGNOSIS — R262 Difficulty in walking, not elsewhere classified: Secondary | ICD-10-CM | POA: Diagnosis not present

## 2016-04-22 DIAGNOSIS — M6281 Muscle weakness (generalized): Secondary | ICD-10-CM | POA: Insufficient documentation

## 2016-04-22 DIAGNOSIS — R296 Repeated falls: Secondary | ICD-10-CM | POA: Insufficient documentation

## 2016-04-22 NOTE — Therapy (Signed)
[place] [place], [place], [address] Phone: [phone]   Fax:  [phone]  Physical Therapy Treatment  Patient Details  Name: Virginia Burns MRN: [id number] Date of Birth: 1946-03-02 Referring Provider: Eldridge Abrahams  Encounter Date: 04/22/2016      PT End of Session - 04/22/16 1343    Visit Number 8   Date for PT Re-Evaluation 05/18/16   PT Start Time 1300   PT Stop Time 1344   PT Time Calculation (min) 44 min   Activity Tolerance Patient tolerated treatment well;No increased pain   Behavior During Therapy WFL for tasks assessed/performed      Past Medical History:  Diagnosis Date  . Allergic rhinitis   . Asthma   . Complication of anesthesia    Versed- hard time working - "dinging for days" - Colonoscopy  . COPD (chronic obstructive pulmonary disease) ([place])   . Diverticulosis of colon (without mention of hemorrhage)   . Emphysema of lung ([place])   . Family history of adverse reaction to anesthesia    Mother- hard to awaken and confusion.  . Family history of malignant neoplasm of gastrointestinal tract   . Gastroparesis   . GERD (gastroesophageal reflux disease)   . Hypertension   . On home oxygen therapy    oxygen at night  . Osteoporosis   . Other esophagitis   . Personal history of colonic polyps 10/07/2009   tubular adenoma  . Shortness of breath dyspnea   . Vitamin B12 deficiency     Past Surgical History:  Procedure Laterality Date  . ABDOMINAL HYSTERECTOMY    . CHOLECYSTECTOMY    . COLONOSCOPY WITH PROPOFOL N/A 11/21/2014   Procedure: COLONOSCOPY WITH PROPOFOL;  Surgeon: Irene Shipper, MD;  Location: [facility];  Service: Endoscopy;  Laterality: N/A;  . [place]  . LIGAMENT REPAIR Left 04/18/2013   Procedure: Triangular Fibrocartilage complex open repair;  Surgeon: Jolyn Nap, MD;  Location: [facility];  Service: Orthopedics;  Laterality:  Left;  . OOPHORECTOMY    . OPEN REDUCTION INTERNAL FIXATION (ORIF) DISTAL RADIAL FRACTURE Left 04/18/2013   Procedure: LEFT OPEN REDUCTION INTERNAL FIXATION (ORIF) DISTAL RADIAL FRACTURE;  Surgeon: Jolyn Nap, MD;  Location: [facility];  Service: Orthopedics;  Laterality: Left;    There were no vitals filed for this visit.      Subjective Assessment - 04/22/16 1302    Subjective "Im doing good, My feet has been bothering me but I am use to it"   Currently in Pain? No/denies   Pain Score 0-No pain                         [facility] Adult PT Treatment/Exercise - 04/22/16 0001      Lumbar Exercises: Aerobic   Stationary Bike Nustep lvl 6, 8 minutes     Lumbar Exercises: Machines for Strengthening   Cybex Knee Extension 10lb 2x10   Cybex Knee Flexion 35lb 2x10   Leg Press 30# 2x10   Other Lumbar Machine Exercise Lats and Rows 20lb 2x10     Lumbar Exercises: Seated   Sit to Stand 10 reps  No UE support, Holding yellow ball.     Knee/Hip Exercises: Standing   Walking with Sports Cord 30lb 4 way x5 each  hip fatugue with side steps  PT Short Term Goals - 03/26/16 1650      PT SHORT TERM GOAL #1   Title independent with initial HEP   Status Achieved           PT Long Term Goals - 04/16/16 1344      PT LONG TERM GOAL #1   Status Achieved     PT LONG TERM GOAL #2   Title decrease TUG time to 12 seconds   Status Achieved     PT LONG TERM GOAL #3   Title increase Berg balance score to 50/56   Status On-going     PT LONG TERM GOAL #4   Title increase LE stregnth to 4+/5   Status On-going               Plan - 04/22/16 1343    Clinical Impression Statement Pt reports that she has more confidence walking, pt with some hip weakness noted with resisted sport cord walking. Continues to report no pain, just issues with balance.   PT Frequency 2x / week   PT Duration 8 weeks   PT Treatment/Interventions  ADLs/Self Care Home Management;Gait training;Stair training;Functional mobility training;Patient/family education;Neuromuscular re-education;Balance training;Therapeutic exercise;Therapeutic activities;Manual techniques   PT Next Visit Plan hip strengthening, balance training, aerobic training- CHECK BERG      Patient will benefit from skilled therapeutic intervention in order to improve the following deficits and impairments:  Abnormal gait, Cardiopulmonary status limiting activity, Decreased activity tolerance, Decreased balance, Decreased mobility, Decreased strength, Difficulty walking, Impaired flexibility  Visit Diagnosis: Muscle weakness (generalized)  Repeated falls  Difficulty in walking, not elsewhere classified     Problem List Patient Active Problem List   Diagnosis Date Noted  . Chronic respiratory failure ([place]) 03/04/2015  . Hx of colonic polyps   . Benign neoplasm of ascending colon   . Benign neoplasm of descending colon   . OSA (obstructive sleep apnea) 09/18/2013  . History of tobacco abuse 09/18/2013  . Benign essential tremor 05/22/2013  . COPD (chronic obstructive pulmonary disease) ([place]) 02/10/2013  . EROSIVE ESOPHAGITIS 10/12/2007  . HYPERTENSION 07/19/2007    Scot Jun, PTA 04/22/2016, 1:46 PM  [address] [place], [place], [address] Phone: [phone]   Fax:  [phone]  Name: Virginia Burns MRN: [id number] Date of Birth: 1946-01-05

## 2016-04-23 ENCOUNTER — Ambulatory Visit: Payer: PPO | Admitting: Physical Therapy

## 2016-04-23 ENCOUNTER — Encounter: Payer: Self-pay | Admitting: Physical Therapy

## 2016-04-23 DIAGNOSIS — R296 Repeated falls: Secondary | ICD-10-CM

## 2016-04-23 DIAGNOSIS — R262 Difficulty in walking, not elsewhere classified: Secondary | ICD-10-CM

## 2016-04-23 DIAGNOSIS — M6281 Muscle weakness (generalized): Secondary | ICD-10-CM

## 2016-04-23 NOTE — Therapy (Signed)
[address] [place], [place], [address] Phone: [phone]   Fax:  [phone]  Physical Therapy Treatment  Patient Details  Name: Virginia Burns MRN: [id number] Date of Birth: [birth year] Referring Provider: Eldridge Abrahams  Encounter Date: 04/23/2016      PT End of Session - 04/23/16 1512    Visit Number 9   Date for PT Re-Evaluation 05/18/16   PT Start Time 1430   PT Stop Time 1512   PT Time Calculation (min) 42 min   Activity Tolerance Patient tolerated treatment well;No increased pain   Behavior During Therapy WFL for tasks assessed/performed      Past Medical History:  Diagnosis Date  . Allergic rhinitis   . Asthma   . Complication of anesthesia    Versed- hard time working - "dinging for days" - Colonoscopy  . COPD (chronic obstructive pulmonary disease) ([place])   . Diverticulosis of colon (without mention of hemorrhage)   . Emphysema of lung ([place])   . Family history of adverse reaction to anesthesia    Mother- hard to awaken and confusion.  . Family history of malignant neoplasm of gastrointestinal tract   . Gastroparesis   . GERD (gastroesophageal reflux disease)   . Hypertension   . On home oxygen therapy    oxygen at night  . Osteoporosis   . Other esophagitis   . Personal history of colonic polyps 10/07/2009   tubular adenoma  . Shortness of breath dyspnea   . Vitamin B12 deficiency     Past Surgical History:  Procedure Laterality Date  . ABDOMINAL HYSTERECTOMY    . CHOLECYSTECTOMY    . COLONOSCOPY WITH PROPOFOL N/A 11/21/2014   Procedure: COLONOSCOPY WITH PROPOFOL;  Surgeon: Irene Shipper, MD;  Location: [facility];  Service: Endoscopy;  Laterality: N/A;  . [place]  . LIGAMENT REPAIR Left 04/18/2013   Procedure: Triangular Fibrocartilage complex open repair;  Surgeon: Jolyn Nap, MD;  Location: [facility];  Service: Orthopedics;  Laterality:  Left;  . OOPHORECTOMY    . OPEN REDUCTION INTERNAL FIXATION (ORIF) DISTAL RADIAL FRACTURE Left 04/18/2013   Procedure: LEFT OPEN REDUCTION INTERNAL FIXATION (ORIF) DISTAL RADIAL FRACTURE;  Surgeon: Jolyn Nap, MD;  Location: [facility];  Service: Orthopedics;  Laterality: Left;    There were no vitals filed for this visit.      Subjective Assessment - 04/23/16 1430    Subjective "Ive been running around all day,, IT will feel good just to sit down"   Currently in Pain? No/denies   Pain Score 0-No pain                         [facility] Adult PT Treatment/Exercise - 04/23/16 0001      High Level Balance   High Level Balance Activities Tandem walking   High Level Balance Comments ball toss with sit to stand 2x10; Step overs on dyna disk 2x5 each     Lumbar Exercises: Aerobic   Stationary Bike Nustep lvl 6, 8 minutes     Lumbar Exercises: Machines for Strengthening   Cybex Knee Extension 10lb 2x10   Cybex Knee Flexion 35lb 2x10   Other Lumbar Machine Exercise Lats and Rows 20lb 2x10     Knee/Hip Exercises: Seated   Sit to Sand 2 sets;10 reps;without UE support  holding yellow ball  PT Short Term Goals - 03/26/16 1650      PT SHORT TERM GOAL #1   Title independent with initial HEP   Status Achieved           PT Long Term Goals - 04/16/16 1344      PT LONG TERM GOAL #1   Status Achieved     PT LONG TERM GOAL #2   Title decrease TUG time to 12 seconds   Status Achieved     PT LONG TERM GOAL #3   Title increase Berg balance score to 50/56   Status On-going     PT LONG TERM GOAL #4   Title increase LE stregnth to 4+/5   Status On-going               Plan - 04/23/16 1512    Clinical Impression Statement Pt does have some difficulty with tandem walking. Decrease activity tolerance during sit to stand with ball toss, pt required a extended seated rest break with the use of her inhaler.     Rehab  Potential Good   PT Frequency 2x / week   PT Duration 8 weeks   PT Treatment/Interventions ADLs/Self Care Home Management;Gait training;Stair training;Functional mobility training;Patient/family education;Neuromuscular re-education;Balance training;Therapeutic exercise;Therapeutic activities;Manual techniques   PT Next Visit Plan Berg, advance balance activities      Patient will benefit from skilled therapeutic intervention in order to improve the following deficits and impairments:  Abnormal gait, Cardiopulmonary status limiting activity, Decreased activity tolerance, Decreased balance, Decreased mobility, Decreased strength, Difficulty walking, Impaired flexibility  Visit Diagnosis: Muscle weakness (generalized)  Difficulty in walking, not elsewhere classified  Repeated falls     Problem List Patient Active Problem List   Diagnosis Date Noted  . Chronic respiratory failure ( [place]) 03/04/2015  . Hx of colonic polyps   . Benign neoplasm of ascending colon   . Benign neoplasm of descending colon   . OSA (obstructive sleep apnea) 09/18/2013  . History of tobacco abuse 09/18/2013  . Benign essential tremor 05/22/2013  . COPD (chronic obstructive pulmonary disease) ([place]) 02/10/2013  . EROSIVE ESOPHAGITIS 10/12/2007  . HYPERTENSION 07/19/2007    Scot Jun, PTA 04/23/2016, 3:14 PM  [address] [place] [place], [place], [address] Phone: [phone]   Fax:  [phone]  Name: Virginia Burns MRN: [id number] Date of Birth: November 23, 1945

## 2016-04-27 ENCOUNTER — Telehealth: Payer: Self-pay | Admitting: Pulmonary Disease

## 2016-04-27 NOTE — Telephone Encounter (Signed)
Spoke with patient, she stated that she has the new CPAP machine and it is not working. She is back to using the oxygen concentrator at night. She also asked that I cancel her 1/5 appt.  Will fax the last OV notes to [facility].

## 2016-04-28 ENCOUNTER — Ambulatory Visit: Payer: PPO | Admitting: Physical Therapy

## 2016-04-28 ENCOUNTER — Encounter: Payer: Self-pay | Admitting: Physical Therapy

## 2016-04-28 DIAGNOSIS — R296 Repeated falls: Secondary | ICD-10-CM

## 2016-04-28 DIAGNOSIS — M6281 Muscle weakness (generalized): Secondary | ICD-10-CM | POA: Diagnosis not present

## 2016-04-28 DIAGNOSIS — R262 Difficulty in walking, not elsewhere classified: Secondary | ICD-10-CM

## 2016-04-28 NOTE — Therapy (Signed)
[address] [place], [place], [address] Phone: [phone]   Fax:  [phone]  Physical Therapy Treatment  Patient Details  Name: Virginia Burns MRN: [id number] Date of Birth: 1946/04/23 Referring Provider: Eldridge Abrahams  Encounter Date: 04/28/2016      PT End of Session - 04/28/16 1342    Visit Number 10   Date for PT Re-Evaluation 05/18/16   PT Start Time 1300   PT Stop Time 1342   PT Time Calculation (min) 42 min   Activity Tolerance Patient tolerated treatment well;No increased pain   Behavior During Therapy WFL for tasks assessed/performed      Past Medical History:  Diagnosis Date  . Allergic rhinitis   . Asthma   . Complication of anesthesia    Versed- hard time working - "dinging for days" - Colonoscopy  . COPD (chronic obstructive pulmonary disease) ([place])   . Diverticulosis of colon (without mention of hemorrhage)   . Emphysema of lung ([place])   . Family history of adverse reaction to anesthesia    Mother- hard to awaken and confusion.  . Family history of malignant neoplasm of gastrointestinal tract   . Gastroparesis   . GERD (gastroesophageal reflux disease)   . Hypertension   . On home oxygen therapy    oxygen at night  . Osteoporosis   . Other esophagitis   . Personal history of colonic polyps 10/07/2009   tubular adenoma  . Shortness of breath dyspnea   . Vitamin B12 deficiency     Past Surgical History:  Procedure Laterality Date  . ABDOMINAL HYSTERECTOMY    . CHOLECYSTECTOMY    . COLONOSCOPY WITH PROPOFOL N/A 11/21/2014   Procedure: COLONOSCOPY WITH PROPOFOL;  Surgeon: Irene Shipper, MD;  Location: [facility];  Service: Endoscopy;  Laterality: N/A;  . [place]  . LIGAMENT REPAIR Left 04/18/2013   Procedure: Triangular Fibrocartilage complex open repair;  Surgeon: Jolyn Nap, MD;  Location: [facility];  Service: Orthopedics;   Laterality: Left;  . OOPHORECTOMY    . OPEN REDUCTION INTERNAL FIXATION (ORIF) DISTAL RADIAL FRACTURE Left 04/18/2013   Procedure: LEFT OPEN REDUCTION INTERNAL FIXATION (ORIF) DISTAL RADIAL FRACTURE;  Surgeon: Jolyn Nap, MD;  Location: [facility];  Service: Orthopedics;  Laterality: Left;    There were no vitals filed for this visit.      Subjective Assessment - 04/28/16 1348    Subjective "I feel fine"   Currently in Pain? No   Pain Score 0/10   Pain Location    Pain Orientation                          [facility] Adult PT Treatment/Exercise - 04/28/16 0001      High Level Balance   High Level Balance Activities Tandem walking;Negotiating over obstacles;Negotiating around obstacles;Marching forwards   High Level Balance Comments ball toss with sit to stand 2x10; Step overs on dyna disk 2x5 each; Lateral step overs; standing on airex with ball toss     Lumbar Exercises: Machines for Strengthening   Cybex Knee Extension 10lb 2x10   Cybex Knee Flexion 35lb 2x10     Lumbar Exercises: Seated   Sit to Stand 10 reps  x3, with OHP with yellow ball                  PT Short Term Goals -  03/26/16 1650      PT SHORT TERM GOAL #1   Title independent with initial HEP   Status Achieved           PT Long Term Goals - 04/16/16 1344      PT LONG TERM GOAL #1   Status Achieved     PT LONG TERM GOAL #2   Title decrease TUG time to 12 seconds   Status Achieved     PT LONG TERM GOAL #3   Title increase Berg balance score to 50/56   Status On-going     PT LONG TERM GOAL #4   Title increase LE stregnth to 4+/5   Status On-going               Plan - 04/28/16 1354    Clinical Impression Statement Pt with a decrease activity tolerance, pt with increase fatigue with balance activities requiring multiple rest breaks. Tandem walking most difficult for patient.   Rehab Potential Good   PT Frequency 2x / week   PT Duration 8  weeks   PT Treatment/Interventions ADLs/Self Care Home Management;Gait training;Stair training;Functional mobility training;Patient/family education;Neuromuscular re-education;Balance training;Therapeutic exercise;Therapeutic activities;Manual techniques   PT Next Visit Plan Berg, advance balance activities      Patient will benefit from skilled therapeutic intervention in order to improve the following deficits and impairments:  Abnormal gait, Cardiopulmonary status limiting activity, Decreased activity tolerance, Decreased balance, Decreased mobility, Decreased strength, Difficulty walking, Impaired flexibility  Visit Diagnosis: Muscle weakness (generalized)  Repeated falls  Difficulty in walking, not elsewhere classified     Problem List Patient Active Problem List   Diagnosis Date Noted  . Chronic respiratory failure ([place]) 03/04/2015  . Hx of colonic polyps   . Benign neoplasm of ascending colon   . Benign neoplasm of descending colon   . OSA (obstructive sleep apnea) 09/18/2013  . History of tobacco abuse 09/18/2013  . Benign essential tremor 05/22/2013  . COPD (chronic obstructive pulmonary disease) ([place]) 02/10/2013  . EROSIVE ESOPHAGITIS 10/12/2007  . HYPERTENSION 07/19/2007    Scot Jun 04/28/2016, 1:55 PM  [address] [place], [place], [address] Phone: [phone]   Fax:  [phone]  Name: Virginia Burns MRN: [id number] Date of Birth: [birth year]

## 2016-05-06 ENCOUNTER — Encounter: Payer: Self-pay | Admitting: Physical Therapy

## 2016-05-06 ENCOUNTER — Ambulatory Visit: Payer: PPO | Admitting: Physical Therapy

## 2016-05-06 DIAGNOSIS — R296 Repeated falls: Secondary | ICD-10-CM

## 2016-05-06 DIAGNOSIS — M6281 Muscle weakness (generalized): Secondary | ICD-10-CM | POA: Diagnosis not present

## 2016-05-06 DIAGNOSIS — R262 Difficulty in walking, not elsewhere classified: Secondary | ICD-10-CM

## 2016-05-06 NOTE — Therapy (Signed)
[address] [place], [place], [address] Phone: [phone]   Fax:  [phone]  Physical Therapy Treatment  Patient Details  Name: Virginia Burns MRN: [id number] Date of Birth: May 02, 1946 Referring Provider: Eldridge Abrahams  Encounter Date: 05/06/2016      PT End of Session - 05/06/16 1341    Visit Number 11   Date for PT Re-Evaluation 05/18/16   PT Start Time 1300   PT Stop Time 1344   PT Time Calculation (min) 44 min   Activity Tolerance Patient tolerated treatment well;No increased pain   Behavior During Therapy WFL for tasks assessed/performed      Past Medical History:  Diagnosis Date  . Allergic rhinitis   . Asthma   . Complication of anesthesia    Versed- hard time working - "dinging for days" - Colonoscopy  . COPD (chronic obstructive pulmonary disease) ([place])   . Diverticulosis of colon (without mention of hemorrhage)   . Emphysema of lung ([place])   . Family history of adverse reaction to anesthesia    Mother- hard to awaken and confusion.  . Family history of malignant neoplasm of gastrointestinal tract   . Gastroparesis   . GERD (gastroesophageal reflux disease)   . Hypertension   . On home oxygen therapy    oxygen at night  . Osteoporosis   . Other esophagitis   . Personal history of colonic polyps 10/07/2009   tubular adenoma  . Shortness of breath dyspnea   . Vitamin B12 deficiency     Past Surgical History:  Procedure Laterality Date  . ABDOMINAL HYSTERECTOMY    . CHOLECYSTECTOMY    . COLONOSCOPY WITH PROPOFOL N/A 11/21/2014   Procedure: COLONOSCOPY WITH PROPOFOL;  Surgeon: Irene Shipper, MD;  Location: [facility];  Service: Endoscopy;  Laterality: N/A;  . [place]  . LIGAMENT REPAIR Left 04/18/2013   Procedure: Triangular Fibrocartilage complex open repair;  Surgeon: Jolyn Nap, MD;  Location: [facility] [facility] [place];  Service: Orthopedics;   Laterality: Left;  . OOPHORECTOMY    . OPEN REDUCTION INTERNAL FIXATION (ORIF) DISTAL RADIAL FRACTURE Left 04/18/2013   Procedure: LEFT OPEN REDUCTION INTERNAL FIXATION (ORIF) DISTAL RADIAL FRACTURE;  Surgeon: Jolyn Nap, MD;  Location: [facility];  Service: Orthopedics;  Laterality: Left;    There were no vitals filed for this visit.      Subjective Assessment - 05/06/16 1303    Subjective "No falls" "Just been busy with the holidays"   Currently in Pain? No/denies   Pain Score 0-No pain                         [facility] Adult PT Treatment/Exercise - 05/06/16 0001      High Level Balance   High Level Balance Activities Side stepping;Backward walking;Negotiating over obstacles   High Level Balance Comments side steps over objects      Lumbar Exercises: Aerobic   Stationary Bike Nustep lvl 6, 8 minutes     Lumbar Exercises: Machines for Strengthening   Cybex Knee Extension 10lb 2x10   Cybex Knee Flexion 35lb 2x10   Other Lumbar Machine Exercise Lats and Rows 20lb 2x10     Lumbar Exercises: Seated   Sit to Stand 10 reps  x2, OHP with yellow ball                   PT Short  Term Goals - 03/26/16 1650      PT SHORT TERM GOAL #1   Title independent with initial HEP   Status Achieved           PT Long Term Goals - 04/16/16 1344      PT LONG TERM GOAL #1   Status Achieved     PT LONG TERM GOAL #2   Title decrease TUG time to 12 seconds   Status Achieved     PT LONG TERM GOAL #3   Title increase Berg balance score to 50/56   Status On-going     PT LONG TERM GOAL #4   Title increase LE stregnth to 4+/5   Status On-going               Plan - 05/06/16 1341    Clinical Impression Statement Pt with increase fatigue and weakness with today's exercises. No reports of increase pain.    Rehab Potential Good   PT Frequency 2x / week   PT Duration 8 weeks   PT Treatment/Interventions ADLs/Self Care Home  Management;Gait training;Stair training;Functional mobility training;Patient/family education;Neuromuscular re-education;Balance training;Therapeutic exercise;Therapeutic activities;Manual techniques   PT Next Visit Plan Berg, advance balance activities      Patient will benefit from skilled therapeutic intervention in order to improve the following deficits and impairments:  Abnormal gait, Cardiopulmonary status limiting activity, Decreased activity tolerance, Decreased balance, Decreased mobility, Decreased strength, Difficulty walking, Impaired flexibility  Visit Diagnosis: Repeated falls  Difficulty in walking, not elsewhere classified  Muscle weakness (generalized)     Problem List Patient Active Problem List   Diagnosis Date Noted  . Chronic respiratory failure ([place]) 03/04/2015  . Hx of colonic polyps   . Benign neoplasm of ascending colon   . Benign neoplasm of descending colon   . OSA (obstructive sleep apnea) 09/18/2013  . History of tobacco abuse 09/18/2013  . Benign essential tremor 05/22/2013  . COPD (chronic obstructive pulmonary disease) ([place]) 02/10/2013  . EROSIVE ESOPHAGITIS 10/12/2007  . HYPERTENSION 07/19/2007    Scot Jun, PTA 05/06/2016, 1:43 PM  [address] [place], [place], [address] Phone: [phone]   Fax:  [phone]  Name: Virginia Burns MRN: [id number] Date of Birth: 1946/05/06

## 2016-05-14 ENCOUNTER — Ambulatory Visit: Payer: PPO | Admitting: Physical Therapy

## 2016-05-14 ENCOUNTER — Encounter: Payer: Self-pay | Admitting: Physical Therapy

## 2016-05-14 DIAGNOSIS — R296 Repeated falls: Secondary | ICD-10-CM

## 2016-05-14 DIAGNOSIS — M6281 Muscle weakness (generalized): Secondary | ICD-10-CM

## 2016-05-14 DIAGNOSIS — R262 Difficulty in walking, not elsewhere classified: Secondary | ICD-10-CM

## 2016-05-14 NOTE — Therapy (Signed)
[address] [place], [place], [address] Phone: [phone]   Fax:  [phone]  Physical Therapy Treatment  Patient Details  Name: Virginia Burns MRN: [id number] Date of Birth: Oct 06, 1945 Referring Provider: Eldridge Abrahams  Encounter Date: 05/14/2016      PT End of Session - 05/14/16 1341    Visit Number 12   Date for PT Re-Evaluation 05/18/16   PT Start Time 1300   PT Stop Time 1341   PT Time Calculation (min) 41 min   Activity Tolerance Patient tolerated treatment well;No increased pain   Behavior During Therapy WFL for tasks assessed/performed      Past Medical History:  Diagnosis Date  . Allergic rhinitis   . Asthma   . Complication of anesthesia    Versed- hard time working - "dinging for days" - Colonoscopy  . COPD (chronic obstructive pulmonary disease) ([place])   . Diverticulosis of colon (without mention of hemorrhage)   . Emphysema of lung ([place])   . Family history of adverse reaction to anesthesia    Mother- hard to awaken and confusion.  . Family history of malignant neoplasm of gastrointestinal tract   . Gastroparesis   . GERD (gastroesophageal reflux disease)   . Hypertension   . On home oxygen therapy    oxygen at night  . Osteoporosis   . Other esophagitis   . Personal history of colonic polyps 10/07/2009   tubular adenoma  . Shortness of breath dyspnea   . Vitamin B12 deficiency     Past Surgical History:  Procedure Laterality Date  . ABDOMINAL HYSTERECTOMY    . CHOLECYSTECTOMY    . COLONOSCOPY WITH PROPOFOL N/A 11/21/2014   Procedure: COLONOSCOPY WITH PROPOFOL;  Surgeon: Irene Shipper, MD;  Location: [facility];  Service: Endoscopy;  Laterality: N/A;  . [place]  . LIGAMENT REPAIR Left 04/18/2013   Procedure: Triangular Fibrocartilage complex open repair;  Surgeon: Jolyn Nap, MD;  Location: [facility];  Service: Orthopedics;   Laterality: Left;  . OOPHORECTOMY    . OPEN REDUCTION INTERNAL FIXATION (ORIF) DISTAL RADIAL FRACTURE Left 04/18/2013   Procedure: LEFT OPEN REDUCTION INTERNAL FIXATION (ORIF) DISTAL RADIAL FRACTURE;  Surgeon: Jolyn Nap, MD;  Location: [facility];  Service: Orthopedics;  Laterality: Left;    There were no vitals filed for this visit.      Subjective Assessment - 05/14/16 1304    Subjective "No falls, I feel fine."   Currently in Pain? No/denies   Pain Score 0-No pain            [facility] PT Assessment - 05/14/16 0001      Strength   Overall Strength Comments 5/5 for the LE's     Berg Balance Test   Sit to Stand Able to stand without using hands and stabilize independently   Standing Unsupported Able to stand safely 2 minutes   Sitting with Back Unsupported but Feet Supported on Floor or Stool Able to sit safely and securely 2 minutes   Stand to Sit Sits safely with minimal use of hands   Transfers Able to transfer safely, minor use of hands   Standing Unsupported with Eyes Closed Able to stand 10 seconds safely   Standing Ubsupported with Feet Together Able to place feet together independently and stand 1 minute safely   From Standing, Reach Forward with Outstretched Arm Can reach confidently >25 cm (10")  From Standing Position, Pick up Object from [place] to pick up shoe safely and easily   From Standing Position, Turn to Look Behind Over each Shoulder Looks behind from both sides and weight shifts well   Turn 360 Degrees Able to turn 360 degrees safely in 4 seconds or less   Standing Unsupported, Alternately Place Feet on Step/Stool Able to stand independently and safely and complete 8 steps in 20 seconds   Standing Unsupported, One Foot in [place] to plae foot ahead of the other independently and hold 30 seconds   Standing on One Leg Able to lift leg independently and hold equal to or more than 3 seconds   Total Score 53                      [facility] Adult PT Treatment/Exercise - 05/14/16 0001      High Level Balance   High Level Balance Activities Side stepping;Marching forwards     Lumbar Exercises: Aerobic   Stationary Bike Nustep lvl 6, 8 minutes     Lumbar Exercises: Machines for Strengthening   Cybex Knee Extension 10lb 2x10   Cybex Knee Flexion 35lb 2x10   Leg Press 30# 3x10   Other Lumbar Machine Exercise Lats and Rows 20lb 2x10     Lumbar Exercises: Seated   Sit to Stand 10 reps  x2, OHP with yellow ball                   PT Short Term Goals - 03/26/16 1650      PT SHORT TERM GOAL #1   Title independent with initial HEP   Status Achieved           PT Long Term Goals - 05/14/16 1327      PT LONG TERM GOAL #3   Title increase Berg balance score to 50/56   Status Achieved     PT LONG TERM GOAL #4   Title increase LE stregnth to 4+/5   Status Achieved               Plan - 05/14/16 1341    Clinical Impression Statement pt has progressed and completed all goals.   Rehab Potential Good   PT Frequency 2x / week   PT Duration 8 weeks   PT Treatment/Interventions ADLs/Self Care Home Management;Gait training;Stair training;Functional mobility training;Patient/family education;Neuromuscular re-education;Balance training;Therapeutic exercise;Therapeutic activities;Manual techniques   PT Next Visit Plan D/C      Patient will benefit from skilled therapeutic intervention in order to improve the following deficits and impairments:  Abnormal gait, Cardiopulmonary status limiting activity, Decreased activity tolerance, Decreased balance, Decreased mobility, Decreased strength, Difficulty walking, Impaired flexibility  Visit Diagnosis: Difficulty in walking, not elsewhere classified  Repeated falls  Muscle weakness (generalized)     Problem List Patient Active Problem List   Diagnosis Date Noted  . Chronic respiratory failure ([place]) 03/04/2015  . Hx  of colonic polyps   . Benign neoplasm of ascending colon   . Benign neoplasm of descending colon   . OSA (obstructive sleep apnea) 09/18/2013  . History of tobacco abuse 09/18/2013  . Benign essential tremor 05/22/2013  . COPD (chronic obstructive pulmonary disease) ([place]) 02/10/2013  . EROSIVE ESOPHAGITIS 10/12/2007  . HYPERTENSION 07/19/2007    PHYSICAL THERAPY DISCHARGE SUMMARY  Visits from Start of Care: 12 Plan: Patient agrees to discharge.  Patient goals were met. Patient is being discharged due to meeting the stated rehab goals.  ?????  Scot Jun, PTA 05/14/2016, 1:42 PM  [address] [place], [place], [address] Phone: [phone]   Fax:  [phone]  Name: Virginia Burns MRN: [id number] Date of Birth: Jun 21, 1945

## 2016-05-22 ENCOUNTER — Ambulatory Visit: Payer: PPO | Admitting: Pulmonary Disease

## 2016-07-28 ENCOUNTER — Telehealth: Payer: Self-pay | Admitting: Pulmonary Disease

## 2016-07-28 NOTE — Telephone Encounter (Signed)
OV notes have been faxed. Nothing further was needed. 

## 2016-07-29 DIAGNOSIS — M546 Pain in thoracic spine: Secondary | ICD-10-CM | POA: Diagnosis not present

## 2016-07-29 DIAGNOSIS — M47814 Spondylosis without myelopathy or radiculopathy, thoracic region: Secondary | ICD-10-CM | POA: Diagnosis not present

## 2016-09-18 DIAGNOSIS — R0602 Shortness of breath: Secondary | ICD-10-CM | POA: Diagnosis not present

## 2016-09-18 DIAGNOSIS — R079 Chest pain, unspecified: Secondary | ICD-10-CM | POA: Diagnosis not present

## 2016-09-18 DIAGNOSIS — L0100 Impetigo, unspecified: Secondary | ICD-10-CM | POA: Diagnosis not present

## 2016-09-18 DIAGNOSIS — J449 Chronic obstructive pulmonary disease, unspecified: Secondary | ICD-10-CM | POA: Diagnosis not present

## 2016-09-18 DIAGNOSIS — I10 Essential (primary) hypertension: Secondary | ICD-10-CM | POA: Diagnosis not present

## 2016-09-21 DIAGNOSIS — L0100 Impetigo, unspecified: Secondary | ICD-10-CM | POA: Diagnosis not present

## 2016-09-21 DIAGNOSIS — E042 Nontoxic multinodular goiter: Secondary | ICD-10-CM | POA: Diagnosis not present

## 2016-09-21 DIAGNOSIS — R0602 Shortness of breath: Secondary | ICD-10-CM | POA: Diagnosis not present

## 2016-10-05 DIAGNOSIS — S92254A Nondisplaced fracture of navicular [scaphoid] of right foot, initial encounter for closed fracture: Secondary | ICD-10-CM | POA: Diagnosis not present

## 2016-10-05 DIAGNOSIS — S92505A Nondisplaced unspecified fracture of left lesser toe(s), initial encounter for closed fracture: Secondary | ICD-10-CM | POA: Diagnosis not present

## 2016-10-06 DIAGNOSIS — S92254A Nondisplaced fracture of navicular [scaphoid] of right foot, initial encounter for closed fracture: Secondary | ICD-10-CM | POA: Diagnosis not present

## 2016-10-06 DIAGNOSIS — S93602A Unspecified sprain of left foot, initial encounter: Secondary | ICD-10-CM | POA: Diagnosis not present

## 2016-10-27 DIAGNOSIS — S90122A Contusion of left lesser toe(s) without damage to nail, initial encounter: Secondary | ICD-10-CM | POA: Diagnosis not present

## 2016-10-27 DIAGNOSIS — S92254K Nondisplaced fracture of navicular [scaphoid] of right foot, subsequent encounter for fracture with nonunion: Secondary | ICD-10-CM | POA: Diagnosis not present

## 2016-10-27 DIAGNOSIS — S93602D Unspecified sprain of left foot, subsequent encounter: Secondary | ICD-10-CM | POA: Diagnosis not present

## 2016-11-03 DIAGNOSIS — S92254K Nondisplaced fracture of navicular [scaphoid] of right foot, subsequent encounter for fracture with nonunion: Secondary | ICD-10-CM | POA: Diagnosis not present

## 2016-11-03 DIAGNOSIS — M79671 Pain in right foot: Secondary | ICD-10-CM | POA: Diagnosis not present

## 2016-11-03 DIAGNOSIS — S93602S Unspecified sprain of left foot, sequela: Secondary | ICD-10-CM | POA: Diagnosis not present

## 2016-11-03 DIAGNOSIS — S99921A Unspecified injury of right foot, initial encounter: Secondary | ICD-10-CM | POA: Diagnosis not present

## 2016-11-03 DIAGNOSIS — S92512A Displaced fracture of proximal phalanx of left lesser toe(s), initial encounter for closed fracture: Secondary | ICD-10-CM | POA: Diagnosis not present

## 2016-11-03 DIAGNOSIS — S92512D Displaced fracture of proximal phalanx of left lesser toe(s), subsequent encounter for fracture with routine healing: Secondary | ICD-10-CM | POA: Diagnosis not present

## 2016-11-09 DIAGNOSIS — S90122A Contusion of left lesser toe(s) without damage to nail, initial encounter: Secondary | ICD-10-CM | POA: Diagnosis not present

## 2016-11-09 DIAGNOSIS — S93602D Unspecified sprain of left foot, subsequent encounter: Secondary | ICD-10-CM | POA: Diagnosis not present

## 2016-11-09 DIAGNOSIS — S92254K Nondisplaced fracture of navicular [scaphoid] of right foot, subsequent encounter for fracture with nonunion: Secondary | ICD-10-CM | POA: Diagnosis not present

## 2016-11-24 DIAGNOSIS — L218 Other seborrheic dermatitis: Secondary | ICD-10-CM | POA: Diagnosis not present

## 2016-12-07 DIAGNOSIS — S90122A Contusion of left lesser toe(s) without damage to nail, initial encounter: Secondary | ICD-10-CM | POA: Diagnosis not present

## 2016-12-07 DIAGNOSIS — S93602D Unspecified sprain of left foot, subsequent encounter: Secondary | ICD-10-CM | POA: Diagnosis not present

## 2016-12-07 DIAGNOSIS — S92254K Nondisplaced fracture of navicular [scaphoid] of right foot, subsequent encounter for fracture with nonunion: Secondary | ICD-10-CM | POA: Diagnosis not present

## 2016-12-07 DIAGNOSIS — M722 Plantar fascial fibromatosis: Secondary | ICD-10-CM | POA: Diagnosis not present

## 2016-12-11 DIAGNOSIS — Z1231 Encounter for screening mammogram for malignant neoplasm of breast: Secondary | ICD-10-CM | POA: Diagnosis not present

## 2017-01-13 DIAGNOSIS — K3184 Gastroparesis: Secondary | ICD-10-CM | POA: Diagnosis not present

## 2017-01-13 DIAGNOSIS — R109 Unspecified abdominal pain: Secondary | ICD-10-CM | POA: Diagnosis not present

## 2017-01-14 ENCOUNTER — Telehealth: Payer: Self-pay | Admitting: Internal Medicine

## 2017-01-21 NOTE — Telephone Encounter (Signed)
Surprised she has not heard back yet. Thought maybe we needed the fax number to last place she ordered from in [place]. [phone]. Needs 90day supply please

## 2017-01-21 NOTE — Telephone Encounter (Signed)
Lm on vm that I think patient needs an office visit first since she has not been seen in 2 years.  I said she could see an APP and get in sooner but that I really thought Dr. Henrene Pastor would want someone to evaluate her before giving her more Domperidone

## 2017-01-22 ENCOUNTER — Encounter: Payer: Self-pay | Admitting: Nurse Practitioner

## 2017-01-22 ENCOUNTER — Telehealth: Payer: Self-pay

## 2017-01-22 ENCOUNTER — Ambulatory Visit (INDEPENDENT_AMBULATORY_CARE_PROVIDER_SITE_OTHER): Payer: PPO | Admitting: Nurse Practitioner

## 2017-01-22 VITALS — BP 122/70 | HR 88 | Ht 65.0 in | Wt 165.2 lb

## 2017-01-22 DIAGNOSIS — K3184 Gastroparesis: Secondary | ICD-10-CM

## 2017-01-22 MED ORDER — AMBULATORY NON FORMULARY MEDICATION: 6 refills | Status: DC

## 2017-01-22 NOTE — Telephone Encounter (Signed)
Pt was in the office today and saw Tye Savoy. Requested that refill of Domperidone be sent to [facility].  Per their website, their number is [phone] but we were unable to reach them.  We tried 9, [phone](international) +44 ([place]) and then the number [phone].  We faxed the Rx to [facility] at [phone]. We also gave the pt a copy of the prescription incase she would rather find a way to send the Rx to [facility] since we were unable to get through to them.

## 2017-01-22 NOTE — Progress Notes (Signed)
     HPI: Patient is a 71 y.o. female with multiple medical problems as listed below. She is known to Dr. Henrene Pastor for history of chronic GERD, gastroparesis, constipation, and adenomatous colon polyps. She was evaluated last in June 2016 for worsening constipation. Subsequent colonoscopy revealed diverticulosis. Two polyps ranging from 3-5 mm were removed and path c/w tubular adenomas without HGD. One of the fragments had associated acute inflammation including cryptitis and crypt abscess.   Last week patient was awoken with terrible upper abdominal pain followed by vomiting. Went to urgent care, felt to be having gastroparesis flare. Patient had stopped her Reglan due to fear of side effects from long-term use. Provider at the walk-in clinic mentioned domperidone. Patient actually had a prescription at home prescribed some time back by Dr. Henrene Pastor. She began taking domperidone and had almost immediate relief. She is here for a refill.   Past Medical History:  Diagnosis Date  . Allergic rhinitis   . Asthma   . Complication of anesthesia    Versed- hard time working - "dinging for days" - Colonoscopy  . COPD (chronic obstructive pulmonary disease) ([place])   . Diverticulosis of colon (without mention of hemorrhage)   . Emphysema of lung ([place])   . Family history of adverse reaction to anesthesia    Mother- hard to awaken and confusion.  . Family history of malignant neoplasm of gastrointestinal tract   . Gastroparesis   . GERD (gastroesophageal reflux disease)   . Hypertension   . On home oxygen therapy    oxygen at night  . Osteoporosis   . Other esophagitis   . Personal history of colonic polyps 10/07/2009   tubular adenoma  . Shortness of breath dyspnea   . Vitamin B12 deficiency     Patient's surgical history, family medical history, social history, medications and allergies were all reviewed in Epic    Physical Exam: BP 122/70 (BP Location: Left Arm, Patient Position: Sitting, Cuff  Size: Normal)   Pulse 88   Ht 5\' 5"  (1.651 m) Comment: height measured without shoes  Wt 165 lb 4 oz (75 kg)   BMI 27.50 kg/m   GENERAL: well developed white female in NAD PSYCH: :Pleasant, cooperative, normal affect EENT:  conjunctiva pink, mucous membranes moist, neck supple without masses CARDIAC:  RRR, no peripheral edema PULM: Normal respiratory effort, lungs CTA bilaterally, no wheezing ABDOMEN:  soft, nontender, nondistended, no obvious masses, no hepatomegaly,  normal bowel sounds. No succussion splash SKIN:  turgor, no lesions seen Musculoskeletal:  Normal muscle tone, normal strength NEURO: Alert and oriented x 3, no focal neurologic deficits   ASSESSMENT and PLAN:  Pleasant 71 year old female with longstanding gastroparesis. She discontinued Reglan out of concern for possible side effects. Now with recent onset of upper abdominal pain and vomiting. Symptoms have now resolved after starting domperidone which she had at home. She is here for refill. She took Domperidone in 2014. She had some tremors but Neurology diagnosed benign familial type tremors.  -Given resolution of symptoms with Domperidone she probably did have a flare up of gastroparesis. I will refill Domperidone for her but also encouraged small frequent meals.  -Patient will call office if she has recurrent abdominal pain or vomiting    Tye Savoy , NP 01/22/2017, 2:12 PM

## 2017-01-22 NOTE — Patient Instructions (Addendum)
If you are age 70 or older, your body mass index should be between 23-30. Your Body mass index is 27.5 kg/m. If this is out of the aforementioned range listed, please consider follow up with your Primary Care Provider.  If you are age 4 or younger, your body mass index should be between 19-25. Your Body mass index is 27.5 kg/m. If this is out of the aformentioned range listed, please consider follow up with your Primary Care Provider.    We have faxed a prescription for Domperidone to [facility].  Their number is [phone].  Thank you.

## 2017-01-25 ENCOUNTER — Telehealth: Payer: Self-pay | Admitting: Nurse Practitioner

## 2017-01-26 NOTE — Telephone Encounter (Signed)
Spoke to pt.  She was able to get through to the [facility] we sent the prescription too and they are sending her the Rx.  Nothing further needed on our end.  I told her that we are unable to call or fax to [facility].

## 2017-02-16 ENCOUNTER — Ambulatory Visit: Payer: PPO | Admitting: Internal Medicine

## 2017-02-16 DIAGNOSIS — H18413 Arcus senilis, bilateral: Secondary | ICD-10-CM | POA: Diagnosis not present

## 2017-02-16 DIAGNOSIS — H2511 Age-related nuclear cataract, right eye: Secondary | ICD-10-CM | POA: Diagnosis not present

## 2017-02-16 DIAGNOSIS — H25043 Posterior subcapsular polar age-related cataract, bilateral: Secondary | ICD-10-CM | POA: Diagnosis not present

## 2017-02-16 DIAGNOSIS — H25013 Cortical age-related cataract, bilateral: Secondary | ICD-10-CM | POA: Diagnosis not present

## 2017-02-16 DIAGNOSIS — H2513 Age-related nuclear cataract, bilateral: Secondary | ICD-10-CM | POA: Diagnosis not present

## 2017-02-17 ENCOUNTER — Telehealth: Payer: Self-pay | Admitting: Internal Medicine

## 2017-02-17 NOTE — Telephone Encounter (Signed)
Pt states that for the past 6 weeks she has been having problems with a gastroparesis flare. States she went to urgent care but is not better. Per pt Dr. Rosalene Billings used to prescribe her Carafate and it coated her stomach and seemed to help. Pt is requesting carafate be called in for her, she states she feels like her stomach is on fire and she is not able to eat much. Please advise.

## 2017-02-18 ENCOUNTER — Other Ambulatory Visit: Payer: Self-pay

## 2017-02-18 MED ORDER — SUCRALFATE 1 GM/10ML PO SUSP: 1.0000 g | Freq: Three times a day (TID) | ORAL | 1 refills | Status: DC

## 2017-02-18 NOTE — Telephone Encounter (Signed)
Spoke with pt and she is aware, script sent to pharmacy. 

## 2017-02-18 NOTE — Addendum Note (Signed)
Addended by: Rosanne Sack R on: 02/18/2017 10:21 AM   Modules accepted: Orders

## 2017-02-18 NOTE — Telephone Encounter (Signed)
OK to prescribe Carafate

## 2017-03-15 ENCOUNTER — Telehealth: Payer: Self-pay | Admitting: Internal Medicine

## 2017-03-18 NOTE — Telephone Encounter (Signed)
Patient calling back regarding this.  °

## 2017-03-19 DIAGNOSIS — M1990 Unspecified osteoarthritis, unspecified site: Secondary | ICD-10-CM | POA: Diagnosis not present

## 2017-03-19 DIAGNOSIS — I10 Essential (primary) hypertension: Secondary | ICD-10-CM | POA: Diagnosis not present

## 2017-03-19 DIAGNOSIS — E559 Vitamin D deficiency, unspecified: Secondary | ICD-10-CM | POA: Diagnosis not present

## 2017-03-19 DIAGNOSIS — Z0000 Encounter for general adult medical examination without abnormal findings: Secondary | ICD-10-CM | POA: Diagnosis not present

## 2017-03-19 DIAGNOSIS — T148XXA Other injury of unspecified body region, initial encounter: Secondary | ICD-10-CM | POA: Diagnosis not present

## 2017-03-19 DIAGNOSIS — E871 Hypo-osmolality and hyponatremia: Secondary | ICD-10-CM | POA: Diagnosis not present

## 2017-03-19 DIAGNOSIS — R0989 Other specified symptoms and signs involving the circulatory and respiratory systems: Secondary | ICD-10-CM | POA: Diagnosis not present

## 2017-03-19 DIAGNOSIS — Z87891 Personal history of nicotine dependence: Secondary | ICD-10-CM | POA: Diagnosis not present

## 2017-03-19 DIAGNOSIS — M8589 Other specified disorders of bone density and structure, multiple sites: Secondary | ICD-10-CM | POA: Diagnosis not present

## 2017-03-19 DIAGNOSIS — S40022A Contusion of left upper arm, initial encounter: Secondary | ICD-10-CM | POA: Diagnosis not present

## 2017-03-19 MED ORDER — SUCRALFATE 1 GM/10ML PO SUSP: 1.0000 g | Freq: Three times a day (TID) | ORAL | 1 refills | Status: DC

## 2017-03-19 NOTE — Telephone Encounter (Signed)
Refilled Carafate for 24 days - patient has upcoming office visit.

## 2017-03-22 DIAGNOSIS — R0989 Other specified symptoms and signs involving the circulatory and respiratory systems: Secondary | ICD-10-CM | POA: Diagnosis not present

## 2017-03-22 DIAGNOSIS — I6523 Occlusion and stenosis of bilateral carotid arteries: Secondary | ICD-10-CM | POA: Diagnosis not present

## 2017-03-23 DIAGNOSIS — I10 Essential (primary) hypertension: Secondary | ICD-10-CM | POA: Diagnosis not present

## 2017-03-23 DIAGNOSIS — E871 Hypo-osmolality and hyponatremia: Secondary | ICD-10-CM | POA: Diagnosis not present

## 2017-03-29 DIAGNOSIS — S93602D Unspecified sprain of left foot, subsequent encounter: Secondary | ICD-10-CM | POA: Diagnosis not present

## 2017-03-29 DIAGNOSIS — L03031 Cellulitis of right toe: Secondary | ICD-10-CM | POA: Diagnosis not present

## 2017-03-29 DIAGNOSIS — S90122A Contusion of left lesser toe(s) without damage to nail, initial encounter: Secondary | ICD-10-CM | POA: Diagnosis not present

## 2017-03-29 DIAGNOSIS — S92254K Nondisplaced fracture of navicular [scaphoid] of right foot, subsequent encounter for fracture with nonunion: Secondary | ICD-10-CM | POA: Diagnosis not present

## 2017-03-29 DIAGNOSIS — M722 Plantar fascial fibromatosis: Secondary | ICD-10-CM | POA: Diagnosis not present

## 2017-03-31 DIAGNOSIS — G4733 Obstructive sleep apnea (adult) (pediatric): Secondary | ICD-10-CM | POA: Diagnosis not present

## 2017-04-05 DIAGNOSIS — H2511 Age-related nuclear cataract, right eye: Secondary | ICD-10-CM | POA: Diagnosis not present

## 2017-04-06 DIAGNOSIS — H2512 Age-related nuclear cataract, left eye: Secondary | ICD-10-CM | POA: Diagnosis not present

## 2017-04-07 ENCOUNTER — Ambulatory Visit: Payer: PPO | Admitting: Internal Medicine

## 2017-04-07 ENCOUNTER — Encounter: Payer: Self-pay | Admitting: Internal Medicine

## 2017-04-07 VITALS — BP 128/84 | HR 74 | Ht 65.5 in | Wt 167.0 lb

## 2017-04-07 DIAGNOSIS — K3184 Gastroparesis: Secondary | ICD-10-CM | POA: Diagnosis not present

## 2017-04-07 DIAGNOSIS — K5900 Constipation, unspecified: Secondary | ICD-10-CM | POA: Diagnosis not present

## 2017-04-07 NOTE — Progress Notes (Signed)
HISTORY OF PRESENT ILLNESS:  Virginia Burns is a 71 y.o. female with multiple medical problems as listed below including a history of COPD for which she has required oxygen at night. Previous patient of Dr. Verl Blalock. She has been followed in this office over the years for chronic GERD, gastroparesis, constipation, and adenomatous colon polyps. I last saw the patient in July 2016 when she underwent surveillance colonoscopy. She was found to have diverticulosis and diminutive adenomas which were removed. Follow-up in 5 years based on overall health recommended. She was last seen in this office by the GI nurse practitioner 11/21/2016 regarding problems with abdominal discomfort, difficulty eating, and vomiting. Felt to have exacerbation of gastroparesis. Was placed back on domperidone. Also has been using pantoprazole and Carafate. Set up for follow-up today. Patient reports that her abdominal discomfort has resolved. Her appetite is improved. She is still apprehensive about trying vegetables or fruits but is interested in trying. She continues with ongoing constipation. She is not using any particular agents. Associated with constipation is bloating. GI review of systems is otherwise negative today.  REVIEW OF SYSTEMS:  All non-GI ROS negative except for sinus and allergy, arthritis, increased thirst, sleeping problems  Past Medical History:  Diagnosis Date  . Allergic rhinitis   . Asthma   . Complication of anesthesia    Versed- hard time working - "dinging for days" - Colonoscopy  . COPD (chronic obstructive pulmonary disease) ([place])   . Diverticulosis of colon (without mention of hemorrhage)   . Emphysema of lung ([place])   . Family history of adverse reaction to anesthesia    Mother- hard to awaken and confusion.  . Family history of malignant neoplasm of gastrointestinal tract   . Gastroparesis   . GERD (gastroesophageal reflux disease)   . Hypertension   . On home oxygen therapy    oxygen at night  . Osteoporosis   . Other esophagitis   . Personal history of colonic polyps 10/07/2009   tubular adenoma  . Shortness of breath dyspnea   . Vitamin B12 deficiency     Past Surgical History:  Procedure Laterality Date  . ABDOMINAL HYSTERECTOMY    . CATARACT EXTRACTION Right   . CHOLECYSTECTOMY    . COLONOSCOPY WITH PROPOFOL N/A 11/21/2014   Procedure: COLONOSCOPY WITH PROPOFOL;  Surgeon: Irene Shipper, MD;  Location: [place];  Service: Endoscopy;  Laterality: N/A;  . [place]  . LIGAMENT REPAIR Left 04/18/2013   Procedure: Triangular Fibrocartilage complex open repair;  Surgeon: Jolyn Nap, MD;  Location: [facility];  Service: Orthopedics;  Laterality: Left;  . OOPHORECTOMY    . OPEN REDUCTION INTERNAL FIXATION (ORIF) DISTAL RADIAL FRACTURE Left 04/18/2013   Procedure: LEFT OPEN REDUCTION INTERNAL FIXATION (ORIF) DISTAL RADIAL FRACTURE;  Surgeon: Jolyn Nap, MD;  Location: [facility];  Service: Orthopedics;  Laterality: Left;    Social History Virginia Burns  reports that she quit smoking about 7 years ago. Her smoking use included cigarettes. She has a 25.00 pack-year smoking history. she has never used smokeless tobacco. She reports that she drinks about 8.4 oz of alcohol per week. She reports that she does not use drugs.  family history includes COPD in her mother; Colitis in her son; Colon cancer in her cousin; Diabetes in her brother; Other in her father; Tremor in her maternal aunt and mother.  Allergies  Allergen Reactions  . Sulfur Rash  . Clindamycin/Lincomycin Hives  PHYSICAL EXAMINATION: Vital signs: BP 128/84   Pulse 74   Ht 5' 5.5" (1.664 m)   Wt 167 lb (75.8 kg)   BMI 27.37 kg/m   Constitutional: generally well-appearing, no acute distress Psychiatric: alert and oriented x3, cooperative Eyes: extraocular movements intact, anicteric, conjunctiva pink Mouth: oral pharynx moist,  no lesions Neck: supple no lymphadenopathy Cardiovascular: heart regular rate and rhythm, systolic murmur Lungs: clear to auscultation bilaterally Abdomen: soft, nontender, nondistended, no obvious ascites, no peritoneal signs, normal bowel sounds, no organomegaly Rectal: Omitted Extremities: no clubbing, cyanosis, or lower extremity edema bilaterally Skin: no lesions on visible extremities Neuro: No focal deficits. Cranial nerves intact  ASSESSMENT:  #1. Gastroparesis. Stable on domperidone #2. GERD. Controlled with PPI and promotility agent #3. History of adenomatous colon polyps last colonoscopy 2016 #4. Chronic constipation. Ongoing significant problem 5. Multiple medical problems   PLAN:  #1. Reflux precautions #2. Continue PPI #3. Continue promotility agent #4. Recommend MiraLAX daily. Titrate to achieve desired result #5. Consider surveillance colonoscopy 2021 #6. Routine office follow-up one year  25 minutes spent face-to-face with the patient. Greater 50% a time use for counseling regarding her multiple GI issues and her management including gastroparesis, GERD, and constipation

## 2017-04-07 NOTE — Patient Instructions (Signed)
Please follow up in one year 

## 2017-04-16 ENCOUNTER — Other Ambulatory Visit: Payer: Self-pay | Admitting: Pulmonary Disease

## 2017-04-20 ENCOUNTER — Telehealth: Payer: Self-pay | Admitting: Internal Medicine

## 2017-04-20 MED ORDER — AMBULATORY NON FORMULARY MEDICATION: 3 refills | Status: DC

## 2017-04-20 NOTE — Telephone Encounter (Signed)
Faxed Domperidone rx to [facility].  Called patient and told her It had been faxed.  She acknowledged and understood.

## 2017-04-21 ENCOUNTER — Telehealth: Payer: Self-pay | Admitting: Pulmonary Disease

## 2017-04-21 NOTE — Telephone Encounter (Signed)
Rec'd fax today from [facility] stating patient has been approved for assistance from the patient access network [employer].   Pt UJ#[id number] Group # [id number] Pt assistance starts 04-06-17 until 04-05-18, Expenses can be submitted until 08-03-2018 Covered services-spiriva and symbicort for asthma fund

## 2017-04-30 DIAGNOSIS — G4733 Obstructive sleep apnea (adult) (pediatric): Secondary | ICD-10-CM | POA: Diagnosis not present

## 2017-05-17 DIAGNOSIS — H2512 Age-related nuclear cataract, left eye: Secondary | ICD-10-CM | POA: Diagnosis not present

## 2017-05-31 DIAGNOSIS — G4733 Obstructive sleep apnea (adult) (pediatric): Secondary | ICD-10-CM | POA: Diagnosis not present

## 2017-06-01 DIAGNOSIS — R0789 Other chest pain: Secondary | ICD-10-CM | POA: Diagnosis not present

## 2017-06-01 DIAGNOSIS — R0781 Pleurodynia: Secondary | ICD-10-CM | POA: Diagnosis not present

## 2017-06-01 DIAGNOSIS — S20211A Contusion of right front wall of thorax, initial encounter: Secondary | ICD-10-CM | POA: Diagnosis not present

## 2017-06-01 DIAGNOSIS — S299XXA Unspecified injury of thorax, initial encounter: Secondary | ICD-10-CM | POA: Diagnosis not present

## 2017-06-01 DIAGNOSIS — E042 Nontoxic multinodular goiter: Secondary | ICD-10-CM | POA: Insufficient documentation

## 2017-06-01 HISTORY — DX: Nontoxic multinodular goiter: E04.2

## 2017-06-04 DIAGNOSIS — R0902 Hypoxemia: Secondary | ICD-10-CM | POA: Diagnosis not present

## 2017-06-04 DIAGNOSIS — J449 Chronic obstructive pulmonary disease, unspecified: Secondary | ICD-10-CM | POA: Diagnosis not present

## 2017-06-04 DIAGNOSIS — S92302A Fracture of unspecified metatarsal bone(s), left foot, initial encounter for closed fracture: Secondary | ICD-10-CM | POA: Diagnosis not present

## 2017-07-01 DIAGNOSIS — G4733 Obstructive sleep apnea (adult) (pediatric): Secondary | ICD-10-CM | POA: Diagnosis not present

## 2017-07-03 DIAGNOSIS — N3000 Acute cystitis without hematuria: Secondary | ICD-10-CM | POA: Diagnosis not present

## 2017-07-03 DIAGNOSIS — R399 Unspecified symptoms and signs involving the genitourinary system: Secondary | ICD-10-CM | POA: Diagnosis not present

## 2017-07-03 DIAGNOSIS — R011 Cardiac murmur, unspecified: Secondary | ICD-10-CM | POA: Diagnosis not present

## 2017-07-05 DIAGNOSIS — R0902 Hypoxemia: Secondary | ICD-10-CM | POA: Diagnosis not present

## 2017-07-05 DIAGNOSIS — J449 Chronic obstructive pulmonary disease, unspecified: Secondary | ICD-10-CM | POA: Diagnosis not present

## 2017-07-05 DIAGNOSIS — S92302A Fracture of unspecified metatarsal bone(s), left foot, initial encounter for closed fracture: Secondary | ICD-10-CM | POA: Diagnosis not present

## 2017-07-12 DIAGNOSIS — R319 Hematuria, unspecified: Secondary | ICD-10-CM | POA: Diagnosis not present

## 2017-07-12 DIAGNOSIS — N390 Urinary tract infection, site not specified: Secondary | ICD-10-CM | POA: Diagnosis not present

## 2017-07-26 DIAGNOSIS — N390 Urinary tract infection, site not specified: Secondary | ICD-10-CM | POA: Diagnosis not present

## 2017-07-29 DIAGNOSIS — G4733 Obstructive sleep apnea (adult) (pediatric): Secondary | ICD-10-CM | POA: Diagnosis not present

## 2017-08-02 DIAGNOSIS — J449 Chronic obstructive pulmonary disease, unspecified: Secondary | ICD-10-CM | POA: Diagnosis not present

## 2017-08-02 DIAGNOSIS — R0902 Hypoxemia: Secondary | ICD-10-CM | POA: Diagnosis not present

## 2017-08-02 DIAGNOSIS — S92302A Fracture of unspecified metatarsal bone(s), left foot, initial encounter for closed fracture: Secondary | ICD-10-CM | POA: Diagnosis not present

## 2017-08-03 DIAGNOSIS — N952 Postmenopausal atrophic vaginitis: Secondary | ICD-10-CM | POA: Diagnosis not present

## 2017-08-03 DIAGNOSIS — N951 Menopausal and female climacteric states: Secondary | ICD-10-CM | POA: Diagnosis not present

## 2017-08-03 DIAGNOSIS — R399 Unspecified symptoms and signs involving the genitourinary system: Secondary | ICD-10-CM | POA: Diagnosis not present

## 2017-08-24 DIAGNOSIS — S51812A Laceration without foreign body of left forearm, initial encounter: Secondary | ICD-10-CM | POA: Diagnosis not present

## 2017-08-26 DIAGNOSIS — S51812A Laceration without foreign body of left forearm, initial encounter: Secondary | ICD-10-CM | POA: Diagnosis not present

## 2017-08-29 DIAGNOSIS — G4733 Obstructive sleep apnea (adult) (pediatric): Secondary | ICD-10-CM | POA: Diagnosis not present

## 2017-09-02 DIAGNOSIS — S92302A Fracture of unspecified metatarsal bone(s), left foot, initial encounter for closed fracture: Secondary | ICD-10-CM | POA: Diagnosis not present

## 2017-09-02 DIAGNOSIS — R0902 Hypoxemia: Secondary | ICD-10-CM | POA: Diagnosis not present

## 2017-09-02 DIAGNOSIS — J449 Chronic obstructive pulmonary disease, unspecified: Secondary | ICD-10-CM | POA: Diagnosis not present

## 2017-09-06 DIAGNOSIS — N3020 Other chronic cystitis without hematuria: Secondary | ICD-10-CM | POA: Diagnosis not present

## 2017-09-06 DIAGNOSIS — R3915 Urgency of urination: Secondary | ICD-10-CM | POA: Diagnosis not present

## 2017-09-07 ENCOUNTER — Other Ambulatory Visit: Payer: Self-pay | Admitting: Internal Medicine

## 2017-09-07 NOTE — Telephone Encounter (Signed)
[place] patient 

## 2017-09-09 MED ORDER — AMBULATORY NON FORMULARY MEDICATION: 3 refills | Status: DC

## 2017-09-09 NOTE — Telephone Encounter (Signed)
Faxed Domperidone rx to [facility]

## 2017-09-15 DIAGNOSIS — K5730 Diverticulosis of large intestine without perforation or abscess without bleeding: Secondary | ICD-10-CM | POA: Diagnosis not present

## 2017-09-15 DIAGNOSIS — N3020 Other chronic cystitis without hematuria: Secondary | ICD-10-CM | POA: Diagnosis not present

## 2017-09-27 DIAGNOSIS — N3020 Other chronic cystitis without hematuria: Secondary | ICD-10-CM | POA: Diagnosis not present

## 2017-09-27 DIAGNOSIS — R3915 Urgency of urination: Secondary | ICD-10-CM | POA: Diagnosis not present

## 2017-09-28 DIAGNOSIS — G4733 Obstructive sleep apnea (adult) (pediatric): Secondary | ICD-10-CM | POA: Diagnosis not present

## 2017-10-02 DIAGNOSIS — S60511A Abrasion of right hand, initial encounter: Secondary | ICD-10-CM | POA: Diagnosis not present

## 2017-10-02 DIAGNOSIS — R0902 Hypoxemia: Secondary | ICD-10-CM | POA: Diagnosis not present

## 2017-10-02 DIAGNOSIS — J449 Chronic obstructive pulmonary disease, unspecified: Secondary | ICD-10-CM | POA: Diagnosis not present

## 2017-10-02 DIAGNOSIS — S92302A Fracture of unspecified metatarsal bone(s), left foot, initial encounter for closed fracture: Secondary | ICD-10-CM | POA: Diagnosis not present

## 2017-10-12 DIAGNOSIS — E871 Hypo-osmolality and hyponatremia: Secondary | ICD-10-CM | POA: Diagnosis not present

## 2017-10-12 DIAGNOSIS — S41111A Laceration without foreign body of right upper arm, initial encounter: Secondary | ICD-10-CM | POA: Diagnosis not present

## 2017-10-12 DIAGNOSIS — I10 Essential (primary) hypertension: Secondary | ICD-10-CM | POA: Diagnosis not present

## 2017-10-12 DIAGNOSIS — J449 Chronic obstructive pulmonary disease, unspecified: Secondary | ICD-10-CM | POA: Diagnosis not present

## 2017-10-29 DIAGNOSIS — G4733 Obstructive sleep apnea (adult) (pediatric): Secondary | ICD-10-CM | POA: Diagnosis not present

## 2017-11-01 ENCOUNTER — Other Ambulatory Visit: Payer: Self-pay

## 2017-11-01 DIAGNOSIS — D485 Neoplasm of uncertain behavior of skin: Secondary | ICD-10-CM | POA: Diagnosis not present

## 2017-11-01 DIAGNOSIS — L720 Epidermal cyst: Secondary | ICD-10-CM | POA: Diagnosis not present

## 2017-11-01 DIAGNOSIS — L309 Dermatitis, unspecified: Secondary | ICD-10-CM | POA: Diagnosis not present

## 2017-11-01 DIAGNOSIS — L820 Inflamed seborrheic keratosis: Secondary | ICD-10-CM | POA: Diagnosis not present

## 2017-11-01 DIAGNOSIS — L249 Irritant contact dermatitis, unspecified cause: Secondary | ICD-10-CM | POA: Diagnosis not present

## 2017-11-02 DIAGNOSIS — R0902 Hypoxemia: Secondary | ICD-10-CM | POA: Diagnosis not present

## 2017-11-02 DIAGNOSIS — S92302A Fracture of unspecified metatarsal bone(s), left foot, initial encounter for closed fracture: Secondary | ICD-10-CM | POA: Diagnosis not present

## 2017-11-02 DIAGNOSIS — J449 Chronic obstructive pulmonary disease, unspecified: Secondary | ICD-10-CM | POA: Diagnosis not present

## 2017-11-28 DIAGNOSIS — G4733 Obstructive sleep apnea (adult) (pediatric): Secondary | ICD-10-CM | POA: Diagnosis not present

## 2017-12-02 DIAGNOSIS — S92302A Fracture of unspecified metatarsal bone(s), left foot, initial encounter for closed fracture: Secondary | ICD-10-CM | POA: Diagnosis not present

## 2017-12-02 DIAGNOSIS — J449 Chronic obstructive pulmonary disease, unspecified: Secondary | ICD-10-CM | POA: Diagnosis not present

## 2017-12-02 DIAGNOSIS — R0902 Hypoxemia: Secondary | ICD-10-CM | POA: Diagnosis not present

## 2017-12-20 DIAGNOSIS — G5761 Lesion of plantar nerve, right lower limb: Secondary | ICD-10-CM | POA: Diagnosis not present

## 2017-12-20 DIAGNOSIS — M2042 Other hammer toe(s) (acquired), left foot: Secondary | ICD-10-CM | POA: Diagnosis not present

## 2017-12-20 DIAGNOSIS — G5762 Lesion of plantar nerve, left lower limb: Secondary | ICD-10-CM | POA: Diagnosis not present

## 2017-12-20 DIAGNOSIS — M2041 Other hammer toe(s) (acquired), right foot: Secondary | ICD-10-CM | POA: Diagnosis not present

## 2017-12-23 ENCOUNTER — Other Ambulatory Visit: Payer: Self-pay | Admitting: Internal Medicine

## 2017-12-29 DIAGNOSIS — G4733 Obstructive sleep apnea (adult) (pediatric): Secondary | ICD-10-CM | POA: Diagnosis not present

## 2018-01-02 DIAGNOSIS — S92302A Fracture of unspecified metatarsal bone(s), left foot, initial encounter for closed fracture: Secondary | ICD-10-CM | POA: Diagnosis not present

## 2018-01-02 DIAGNOSIS — R0902 Hypoxemia: Secondary | ICD-10-CM | POA: Diagnosis not present

## 2018-01-02 DIAGNOSIS — J449 Chronic obstructive pulmonary disease, unspecified: Secondary | ICD-10-CM | POA: Diagnosis not present

## 2018-01-03 DIAGNOSIS — N3001 Acute cystitis with hematuria: Secondary | ICD-10-CM | POA: Diagnosis not present

## 2018-01-04 ENCOUNTER — Encounter: Payer: Self-pay | Admitting: Pulmonary Disease

## 2018-01-04 ENCOUNTER — Ambulatory Visit: Payer: PPO | Admitting: Pulmonary Disease

## 2018-01-04 VITALS — BP 128/86 | HR 85 | Ht 65.5 in | Wt 174.8 lb

## 2018-01-04 DIAGNOSIS — J449 Chronic obstructive pulmonary disease, unspecified: Secondary | ICD-10-CM | POA: Diagnosis not present

## 2018-01-04 DIAGNOSIS — E662 Morbid (severe) obesity with alveolar hypoventilation: Secondary | ICD-10-CM | POA: Diagnosis not present

## 2018-01-04 DIAGNOSIS — G4733 Obstructive sleep apnea (adult) (pediatric): Secondary | ICD-10-CM | POA: Diagnosis not present

## 2018-01-04 DIAGNOSIS — I4891 Unspecified atrial fibrillation: Secondary | ICD-10-CM | POA: Diagnosis not present

## 2018-01-04 MED ORDER — BUDESONIDE-FORMOTEROL FUMARATE 160-4.5 MCG/ACT IN AERO: 2.0000 | INHALATION_SPRAY | Freq: Two times a day (BID) | RESPIRATORY_TRACT | 5 refills | Status: AC

## 2018-01-04 MED ORDER — TIOTROPIUM BROMIDE MONOHYDRATE 18 MCG IN CAPS: 18.0000 ug | ORAL_CAPSULE | Freq: Every day | RESPIRATORY_TRACT | 3 refills | Status: DC

## 2018-01-04 MED ORDER — MONTELUKAST SODIUM 10 MG PO TABS: 10.0000 mg | ORAL_TABLET | Freq: Every day | ORAL | 5 refills | Status: DC

## 2018-01-04 MED ORDER — BUDESONIDE-FORMOTEROL FUMARATE 160-4.5 MCG/ACT IN AERO: 2.0000 | INHALATION_SPRAY | Freq: Two times a day (BID) | RESPIRATORY_TRACT | 0 refills | Status: DC

## 2018-01-04 NOTE — Progress Notes (Signed)
[facility]  Chief Complaint  Patient presents with  . Follow-up    Not currently wearing cpap- wears 2L qhs. c/o sob with exertion.    Constitutional: BP 128/86 (BP Location: Left Arm, Cuff Size: Normal)   Pulse 85   Ht 5' 5.5" (1.664 m)   Wt 174 lb 12.8 oz (79.3 kg)   SpO2 97%   BMI 28.65 kg/m   History of Present Illness: Virginia Burns is a 72 y.o. female former smoker with COPD and nocturnal hypoxemia.  She has occasional cough.  Not having wheeze, sputum, chest pain, fever, hemoptysis, or leg swelling.  Gets short of breath with activity, especially in hot weather.  Recovers after resting for few minutes.  Using 2 liters oxygen at night.  Planning trip to [place] next year with her family.  Comprehensive Respiratory Exam:  Appearance - well kempt  ENMT - nasal mucosa moist, turbinates clear, midline nasal septum, no dental lesions, no gingival bleeding, no oral exudates, no tonsillar hypertrophy, raspy voice Neck - no masses, trachea midline, no thyromegaly, no elevation in JVP Respiratory - normal appearance of chest wall, normal respiratory effort w/o accessory muscle use, no dullness on percussion, no wheezing or rales CV - s1s2 regular rate and rhythm, no murmurs, no peripheral edema, radial pulses symmetric GI - soft, non tender, no masses Lymph - no adenopathy noted in neck and axillary areas MSK - normal muscle strength and tone, normal gait Ext - no cyanosis, clubbing, or joint inflammation noted Skin - no rashes, lesions, or ulcers Neuro - oriented to person, place, and time Psych - normal mood and affect   Assessment/Plan:  COPD with chronic bronchitis. - continue spiriva, symbicort, prn albuterol - she will call when she is due for refill, and then consider changing to trelegy  Chronic respiratory failure with hypoxia. - from COPD - continue 2 liters oxygen at night - discussed the steps she would need to do in  order to travel with oxygen during her trip to [place] next year   Patient Instructions  Follow up in 1 year    Chesley Mires, MD [place] 01/04/2018, 12:48 PM  Flow Sheet  Pulmonary tests: PFT 08/09/07>>FEV1 1.54 (63%), FEV1% 53, TLC 3.42 (65%), DLCO 64%, no BD PFT 02/10/13>>FEV1 1.32 (52%), FEV1% 58, TLC 5.07 (96%), DLCO 60%, no BD  Sleep tests PSG 10/24/13>>AHI 6.8, SaO2 low 84%, Spent 28.3 min with SaO2 <90%.  Past Medical History: She  has a past medical history of Allergic rhinitis, Asthma, Complication of anesthesia, COPD (chronic obstructive pulmonary disease) ([place]), Diverticulosis of colon (without mention of hemorrhage), Emphysema of lung ([place] [place]), Family history of adverse reaction to anesthesia, Family history of malignant neoplasm of gastrointestinal tract, Gastroparesis, GERD (gastroesophageal reflux disease), Hypertension, On home oxygen therapy, Osteoporosis, Other esophagitis, Personal history of colonic polyps (10/07/2009), Shortness of breath dyspnea, and Vitamin B12 deficiency.  Past Surgical History: She  has a past surgical history that includes Cholecystectomy; Abdominal hysterectomy; Open reduction internal fixation (orif) distal radial fracture (Left, 04/18/2013); Ligament repair (Left, 04/18/2013); Oophorectomy; Colonoscopy with propofol (N/A, 11/21/2014); Gallbladder surgery (1986); and Cataract extraction (Right).  Family History: Her family history includes COPD in her mother; Colitis in her son; Colon cancer in her cousin; Diabetes in her brother; Other in her father; Tremor in her maternal aunt and mother.  Social History: She  reports that she quit smoking about 8 years ago. Her smoking use included cigarettes. She has a  25.00 pack-year smoking history. She has never used smokeless tobacco. She reports that she drinks about 14.0 standard drinks of alcohol per week. She reports that she does not use drugs.  Medications: Allergies as of  01/04/2018      Reactions   Sulfur Rash   Clindamycin/lincomycin Hives      Medication List        Accurate as of 01/04/18 12:48 PM. Always use your most recent med list.          AMBULATORY NON FORMULARY MEDICATION Medication Name: Domperidone 10 mg                               Please take one tablet by mouth three times daily before meals   AMBULATORY NON FORMULARY MEDICATION Pt on Oxygen, 2 units, at night   amLODipine 5 MG tablet Commonly known as:  NORVASC Take 7.5 mg by mouth daily.   budesonide-formoterol 160-4.5 MCG/ACT inhaler Commonly known as:  SYMBICORT Inhale 2 puffs into the lungs 2 (two) times daily.   budesonide-formoterol 160-4.5 MCG/ACT inhaler Commonly known as:  SYMBICORT Inhale 2 puffs into the lungs 2 (two) times daily for 1 day.   CALCIUM 500 + D PO Take 1 tablet by mouth daily.   CARAFATE 1 GM/10ML suspension Generic drug:  sucralfate TAKE 2 TEASPOONFUL (10 MLS) BY MOUTH  4 TIMES DAILY WITH MEALS AND AT BEDTIME   cholecalciferol 1000 units tablet Commonly known as:  VITAMIN D Take 1,000 Units by mouth daily.   ESTRACE VAGINAL 0.1 MG/GM vaginal cream Generic drug:  estradiol as needed. Frequency:to area 2-3 times weekly   Dosage:0.1   MG/GM  Instructions:Estrace 0.1MG /GM, 1 application to area 2-3 times weekly  Note:   FLAXSEED OIL PO Take 1 tablet by mouth daily.   guaiFENesin 600 MG 12 hr tablet Commonly known as:  MUCINEX Take 1 tablet (600 mg total) by mouth 2 (two) times daily. For 5 days   LORazepam 0.5 MG tablet Commonly known as:  ATIVAN Take 0.5 mg by mouth as needed for anxiety.   pantoprazole 40 MG tablet Commonly known as:  PROTONIX Take 40 mg by mouth 2 (two) times daily.   PROAIR HFA 108 (90 Base) MCG/ACT inhaler Generic drug:  albuterol Inhale 2 puffs into the lungs every 6 (six) hours as needed for wheezing or shortness of breath.   SAF-GEL Gel Apply 1 application topically Two (2) times a day.   tiotropium 18  MCG inhalation capsule Commonly known as:  SPIRIVA Place 1 capsule (18 mcg total) into inhaler and inhale daily.   vitamin C 500 MG tablet Commonly known as:  ASCORBIC ACID Take 500 mg by mouth as needed.   VSL#3 Caps Take 1 capsule by mouth daily.

## 2018-01-04 NOTE — Patient Instructions (Signed)
Follow up in 1 year.

## 2018-01-13 DIAGNOSIS — R350 Frequency of micturition: Secondary | ICD-10-CM | POA: Diagnosis not present

## 2018-01-13 DIAGNOSIS — N390 Urinary tract infection, site not specified: Secondary | ICD-10-CM | POA: Diagnosis not present

## 2018-01-14 DIAGNOSIS — L249 Irritant contact dermatitis, unspecified cause: Secondary | ICD-10-CM | POA: Diagnosis not present

## 2018-01-17 DIAGNOSIS — D690 Allergic purpura: Secondary | ICD-10-CM | POA: Diagnosis not present

## 2018-01-22 ENCOUNTER — Encounter (HOSPITAL_COMMUNITY): Payer: Self-pay

## 2018-01-22 ENCOUNTER — Emergency Department (HOSPITAL_COMMUNITY): Payer: PPO

## 2018-01-22 ENCOUNTER — Ambulatory Visit (INDEPENDENT_AMBULATORY_CARE_PROVIDER_SITE_OTHER): Payer: Self-pay | Admitting: Orthopaedic Surgery

## 2018-01-22 ENCOUNTER — Other Ambulatory Visit: Payer: Self-pay

## 2018-01-22 ENCOUNTER — Emergency Department (HOSPITAL_COMMUNITY)
Admission: EM | Admit: 2018-01-22 | Discharge: 2018-01-22 | Disposition: A | Discharge status: Home / Self Care | Payer: PPO | Source: Home / Self Care | Attending: Emergency Medicine | Admitting: Emergency Medicine

## 2018-01-22 DIAGNOSIS — K5730 Diverticulosis of large intestine without perforation or abscess without bleeding: Secondary | ICD-10-CM | POA: Diagnosis present

## 2018-01-22 DIAGNOSIS — Z833 Family history of diabetes mellitus: Secondary | ICD-10-CM | POA: Diagnosis not present

## 2018-01-22 DIAGNOSIS — E871 Hypo-osmolality and hyponatremia: Secondary | ICD-10-CM

## 2018-01-22 DIAGNOSIS — M545 Low back pain: Secondary | ICD-10-CM | POA: Diagnosis present

## 2018-01-22 DIAGNOSIS — W19XXXA Unspecified fall, initial encounter: Secondary | ICD-10-CM

## 2018-01-22 DIAGNOSIS — R52 Pain, unspecified: Secondary | ICD-10-CM | POA: Diagnosis not present

## 2018-01-22 DIAGNOSIS — Y92009 Unspecified place in unspecified non-institutional (private) residence as the place of occurrence of the external cause: Secondary | ICD-10-CM

## 2018-01-22 DIAGNOSIS — Y939 Activity, unspecified: Secondary | ICD-10-CM | POA: Insufficient documentation

## 2018-01-22 DIAGNOSIS — R0902 Hypoxemia: Secondary | ICD-10-CM | POA: Diagnosis not present

## 2018-01-22 DIAGNOSIS — G4733 Obstructive sleep apnea (adult) (pediatric): Secondary | ICD-10-CM | POA: Diagnosis present

## 2018-01-22 DIAGNOSIS — Z87891 Personal history of nicotine dependence: Secondary | ICD-10-CM

## 2018-01-22 DIAGNOSIS — K210 Gastro-esophageal reflux disease with esophagitis: Secondary | ICD-10-CM | POA: Diagnosis present

## 2018-01-22 DIAGNOSIS — R000 Tachycardia, unspecified: Secondary | ICD-10-CM | POA: Diagnosis not present

## 2018-01-22 DIAGNOSIS — E538 Deficiency of other specified B group vitamins: Secondary | ICD-10-CM | POA: Diagnosis present

## 2018-01-22 DIAGNOSIS — S42472A Displaced transcondylar fracture of left humerus, initial encounter for closed fracture: Secondary | ICD-10-CM | POA: Diagnosis present

## 2018-01-22 DIAGNOSIS — Z825 Family history of asthma and other chronic lower respiratory diseases: Secondary | ICD-10-CM | POA: Diagnosis not present

## 2018-01-22 DIAGNOSIS — Z79899 Other long term (current) drug therapy: Secondary | ICD-10-CM

## 2018-01-22 DIAGNOSIS — J439 Emphysema, unspecified: Secondary | ICD-10-CM | POA: Diagnosis present

## 2018-01-22 DIAGNOSIS — M810 Age-related osteoporosis without current pathological fracture: Secondary | ICD-10-CM | POA: Diagnosis present

## 2018-01-22 DIAGNOSIS — S42412A Displaced simple supracondylar fracture without intercondylar fracture of left humerus, initial encounter for closed fracture: Secondary | ICD-10-CM | POA: Insufficient documentation

## 2018-01-22 DIAGNOSIS — Z800 Family history of malignant neoplasm of digestive organs: Secondary | ICD-10-CM | POA: Diagnosis not present

## 2018-01-22 DIAGNOSIS — R0602 Shortness of breath: Secondary | ICD-10-CM | POA: Diagnosis not present

## 2018-01-22 DIAGNOSIS — S42492A Other displaced fracture of lower end of left humerus, initial encounter for closed fracture: Secondary | ICD-10-CM | POA: Diagnosis not present

## 2018-01-22 DIAGNOSIS — Z9049 Acquired absence of other specified parts of digestive tract: Secondary | ICD-10-CM | POA: Diagnosis not present

## 2018-01-22 DIAGNOSIS — J449 Chronic obstructive pulmonary disease, unspecified: Secondary | ICD-10-CM

## 2018-01-22 DIAGNOSIS — Y999 Unspecified external cause status: Secondary | ICD-10-CM | POA: Insufficient documentation

## 2018-01-22 DIAGNOSIS — Z881 Allergy status to other antibiotic agents status: Secondary | ICD-10-CM | POA: Diagnosis not present

## 2018-01-22 DIAGNOSIS — S42411A Displaced simple supracondylar fracture without intercondylar fracture of right humerus, initial encounter for closed fracture: Secondary | ICD-10-CM

## 2018-01-22 DIAGNOSIS — Z9981 Dependence on supplemental oxygen: Secondary | ICD-10-CM | POA: Diagnosis not present

## 2018-01-22 DIAGNOSIS — Z7951 Long term (current) use of inhaled steroids: Secondary | ICD-10-CM | POA: Diagnosis not present

## 2018-01-22 DIAGNOSIS — G8918 Other acute postprocedural pain: Secondary | ICD-10-CM | POA: Diagnosis not present

## 2018-01-22 DIAGNOSIS — W1830XA Fall on same level, unspecified, initial encounter: Secondary | ICD-10-CM | POA: Diagnosis present

## 2018-01-22 DIAGNOSIS — I10 Essential (primary) hypertension: Secondary | ICD-10-CM | POA: Diagnosis present

## 2018-01-22 DIAGNOSIS — Z9841 Cataract extraction status, right eye: Secondary | ICD-10-CM | POA: Diagnosis not present

## 2018-01-22 DIAGNOSIS — Z882 Allergy status to sulfonamides status: Secondary | ICD-10-CM | POA: Diagnosis not present

## 2018-01-22 DIAGNOSIS — S3992XA Unspecified injury of lower back, initial encounter: Secondary | ICD-10-CM | POA: Diagnosis not present

## 2018-01-22 DIAGNOSIS — W010XXA Fall on same level from slipping, tripping and stumbling without subsequent striking against object, initial encounter: Secondary | ICD-10-CM | POA: Insufficient documentation

## 2018-01-22 DIAGNOSIS — Z90710 Acquired absence of both cervix and uterus: Secondary | ICD-10-CM | POA: Diagnosis not present

## 2018-01-22 DIAGNOSIS — S42402A Unspecified fracture of lower end of left humerus, initial encounter for closed fracture: Secondary | ICD-10-CM | POA: Diagnosis not present

## 2018-01-22 DIAGNOSIS — T07XXXA Unspecified multiple injuries, initial encounter: Secondary | ICD-10-CM | POA: Diagnosis not present

## 2018-01-22 DIAGNOSIS — S42401A Unspecified fracture of lower end of right humerus, initial encounter for closed fracture: Secondary | ICD-10-CM

## 2018-01-22 DIAGNOSIS — Z86010 Personal history of colonic polyps: Secondary | ICD-10-CM | POA: Diagnosis not present

## 2018-01-22 DIAGNOSIS — S72451A Displaced supracondylar fracture without intracondylar extension of lower end of right femur, initial encounter for closed fracture: Secondary | ICD-10-CM

## 2018-01-22 LAB — CBC WITH DIFFERENTIAL/PLATELET
BASOS ABS: 0 10*3/uL (ref 0.0–0.1)
Basophils Relative: 0 %
EOS ABS: 0 10*3/uL (ref 0.0–0.7)
EOS PCT: 0 %
HCT: 36.9 % (ref 36.0–46.0)
Hemoglobin: 12.7 g/dL (ref 12.0–15.0)
Lymphocytes Relative: 8 %
Lymphs Abs: 1.2 10*3/uL (ref 0.7–4.0)
MCH: 31.7 pg (ref 26.0–34.0)
MCHC: 34.4 g/dL (ref 30.0–36.0)
MCV: 92 fL (ref 78.0–100.0)
MONO ABS: 2.1 10*3/uL — AB (ref 0.1–1.0)
Monocytes Relative: 14 %
Neutro Abs: 11.8 10*3/uL — ABNORMAL HIGH (ref 1.7–7.7)
Neutrophils Relative %: 78 %
PLATELETS: 435 10*3/uL — AB (ref 150–400)
RBC: 4.01 MIL/uL (ref 3.87–5.11)
RDW: 13 % (ref 11.5–15.5)
WBC: 15.2 10*3/uL — ABNORMAL HIGH (ref 4.0–10.5)

## 2018-01-22 LAB — BASIC METABOLIC PANEL
ANION GAP: 13 (ref 5–15)
BUN: 14 mg/dL (ref 8–23)
CALCIUM: 9 mg/dL (ref 8.9–10.3)
CO2: 25 mmol/L (ref 22–32)
Chloride: 90 mmol/L — ABNORMAL LOW (ref 98–111)
Creatinine, Ser: 0.59 mg/dL (ref 0.44–1.00)
GFR calc Af Amer: >60 mL/min (ref >60)
GLUCOSE: 104 mg/dL — AB (ref 70–99)
POTASSIUM: 3.7 mmol/L (ref 3.5–5.1)
SODIUM: 128 mmol/L — AB (ref 135–145)

## 2018-01-22 MED ORDER — SODIUM CHLORIDE 0.9 % IV BOLUS
1000.0000 mL | Freq: Once | INTRAVENOUS | Status: AC
  Administered 2018-01-22: 1000 mL via INTRAVENOUS

## 2018-01-22 MED ORDER — MORPHINE SULFATE (PF) 4 MG/ML IV SOLN
4.0000 mg | Freq: Once | INTRAVENOUS | Status: AC
  Administered 2018-01-22: 4 mg via INTRAVENOUS
  Filled 2018-01-22: qty 1

## 2018-01-22 MED ORDER — SODIUM CHLORIDE 0.9 % IV SOLN
INTRAVENOUS | Status: DC
  Administered 2018-01-22: 17:00:00 via INTRAVENOUS

## 2018-01-22 MED ORDER — OXYCODONE-ACETAMINOPHEN 5-325 MG PO TABS: 1.0000 | ORAL_TABLET | Freq: Four times a day (QID) | ORAL | 0 refills | Status: DC | PRN

## 2018-01-22 NOTE — H&P (Signed)
Virginia Burns is an 72 y.o. female.   Chief Complaint: left distal humerus transcondylar humerus fracture, closed and displaced with angulation HPI: 72 yo  Female with fall at home yesterday with left elbow pain and transcondylar closed , displaced distal humerus fracture.   Past Medical History:  Diagnosis Date  . Allergic rhinitis   . Asthma   . Complication of anesthesia    [employer]- hard time working - "dinging for days" - Colonoscopy  . COPD (chronic obstructive pulmonary disease) ([place])   . Diverticulosis of colon (without mention of hemorrhage)   . Emphysema of lung ([place])   . Family history of adverse reaction to anesthesia    Mother- hard to awaken and confusion.  . Family history of malignant neoplasm of gastrointestinal tract   . Gastroparesis   . GERD (gastroesophageal reflux disease)   . Hypertension   . On home oxygen therapy    oxygen at night  . Osteoporosis   . Other esophagitis   . Personal history of colonic polyps 10/07/2009   tubular adenoma  . Shortness of breath dyspnea   . Vitamin B12 deficiency     Past Surgical History:  Procedure Laterality Date  . ABDOMINAL HYSTERECTOMY    . CATARACT EXTRACTION Right   . CHOLECYSTECTOMY    . COLONOSCOPY WITH PROPOFOL N/A 11/21/2014   Procedure: COLONOSCOPY WITH PROPOFOL;  Surgeon: Irene Shipper, MD;  Location: [facility];  Service: Endoscopy;  Laterality: N/A;  . [place]  . LIGAMENT REPAIR Left 04/18/2013   Procedure: Triangular Fibrocartilage complex open repair;  Surgeon: Jolyn Nap, MD;  Location: [facility];  Service: Orthopedics;  Laterality: Left;  . OOPHORECTOMY    . OPEN REDUCTION INTERNAL FIXATION (ORIF) DISTAL RADIAL FRACTURE Left 04/18/2013   Procedure: LEFT OPEN REDUCTION INTERNAL FIXATION (ORIF) DISTAL RADIAL FRACTURE;  Surgeon: Jolyn Nap, MD;  Location: [facility];  Service: Orthopedics;  Laterality: Left;    Family History  Problem Relation  Age of Onset  . COPD Mother        Deceased, 32  . Tremor Mother   . Colon cancer Cousin   . Diabetes Brother   . Colitis Son   . Tremor Maternal Aunt   . Other Father        Deceased, 51   Social History:  reports that she quit smoking about 8 years ago. Her smoking use included cigarettes. She has a 25.00 pack-year smoking history. She has never used smokeless tobacco. She reports that she drinks about 14.0 standard drinks of alcohol per week. She reports that she does not use drugs.  Allergies:  Allergies  Allergen Reactions  . Sulfur Rash  . Clindamycin/Lincomycin Hives     (Not in a hospital admission)  Results for orders placed or performed during the hospital encounter of 01/22/18 (from the past 48 hour(s))  CBC with Differential/Platelet     Status: Abnormal   Collection Time: 01/22/18  4:20 PM  Result Value Ref Range   WBC 15.2 (H) 4.0 - 10.5 K/uL   RBC 4.01 3.87 - 5.11 MIL/uL   Hemoglobin 12.7 12.0 - 15.0 g/dL   HCT 36.9 36.0 - 46.0 %   MCV 92.0 78.0 - 100.0 fL   MCH 31.7 26.0 - 34.0 pg   MCHC 34.4 30.0 - 36.0 g/dL   RDW 13.0 11.5 - 15.5 %   Platelets 435 (H) 150 - 400 K/uL   Neutrophils Relative % 78 %  Neutro Abs 11.8 (H) 1.7 - 7.7 K/uL   Lymphocytes Relative 8 %   Lymphs Abs 1.2 0.7 - 4.0 K/uL   Monocytes Relative 14 %   Monocytes Absolute 2.1 (H) 0.1 - 1.0 K/uL   Eosinophils Relative 0 %   Eosinophils Absolute 0.0 0.0 - 0.7 K/uL   Basophils Relative 0 %   Basophils Absolute 0.0 0.0 - 0.1 K/uL    Comment: Performed at [facility], [place] [address], [place], [place] [address]  Basic metabolic panel     Status: Abnormal   Collection Time: 01/22/18  4:20 PM  Result Value Ref Range   Sodium 128 (L) 135 - 145 mmol/L   Potassium 3.7 3.5 - 5.1 mmol/L   Chloride 90 (L) 98 - 111 mmol/L   CO2 25 22 - 32 mmol/L   Glucose, Bld 104 (H) 70 - 99 mg/dL   BUN 14 8 - 23 mg/dL   Creatinine, Ser 0.59 0.44 - 1.00 mg/dL   Calcium 9.0 8.9 - 10.3 mg/dL    GFR calc non Af Amer >60 >60 mL/min   GFR calc Af Amer >60 >60 mL/min    Comment: (NOTE) The eGFR has been calculated using the CKD EPI equation. This calculation has not been validated in all clinical situations. eGFR's persistently <60 mL/min signify possible Chronic Kidney Disease.    Anion gap 13 5 - 15    Comment: Performed at [facility], [place] [address] [place],  [address]   Dg Lumbar Spine Complete  Result Date: 01/22/2018 CLINICAL DATA:  Mid lumbosacral spine pain post fall. EXAM: LUMBAR SPINE - COMPLETE 4+ VIEW COMPARISON:  Body CT 09/15/2017 FINDINGS: There is no evidence of lumbar spine fracture. Alignment is normal. Mild height loss of T12, L1, L3, L4, favored to be degenerative in etiology. Heavy calcific atherosclerotic disease of the aorta. Gas is distension of the abdomen. IMPRESSION: No definite evidence of lumbosacral spine fracture. Mild height loss of T12, L1, L3, L4, favored to be degenerative in etiology. Electronically Signed   By: Fidela Salisbury M.D.   On: 01/22/2018 14:34   Dg Elbow 2 Views Left  Result Date: 01/22/2018 CLINICAL DATA:  Left elbow pain following a fall last night. EXAM: LEFT ELBOW - 2 VIEW COMPARISON:  None. FINDINGS: Transcondylar fracture of the distal humerus with 1 shaft width of anterior displacement, 3 cm of proximal displacement and anterior angulation of the distal fragment. Associated posterior soft tissue swelling. IMPRESSION: Markedly displaced and angulated transcondylar fracture of the distal humerus, as described above. Electronically Signed   By: Claudie Revering M.D.   On: 01/22/2018 14:51   Ct Elbow Left Wo Contrast  Result Date: 01/22/2018 CLINICAL DATA:  Distal humeral fracture. EXAM: CT OF THE UPPER LEFT EXTREMITY WITHOUT CONTRAST TECHNIQUE: Multidetector CT imaging of the upper left extremity was performed according to the standard protocol. COMPARISON:  None. FINDINGS: Extensive motion artifact. Poor  signal to noise ratio. Difficulty obtaining adequate reconstruction planes. Bones/Joint/Cartilage Displaced supracondylar fracture of the distal humerus with the distal fragment displaced about 1.5 cm anterior and overlapped by about 2.5 cm. There is resulting apex posterior angulation. The fractured distal metaphyseal margin of the humeral shaft partially articulates with the anterior-distal margin of the ulnar trochlea. Irregularities in the radius and proximal ulna are thought to be due to motion artifact rather than a true fracture. Ligaments Suboptimally assessed by CT. Muscles and Tendons Tendon such as the biceps are difficult to assess due  to positioning difficulties. Soft tissues Notable surrounding soft tissue swelling. IMPRESSION: 1. Displaced supracondylar fracture, with the distal fragment displaced about 1.5 cm anteriorly and overlapped by about 2.5 cm. No definite additional fracture although there is extensive motion artifact which partially obscures the radius and ulna, leading to a go stated and double contour appearance markedly reducing sensitivity for subtle fractures of the radius and ulna. Electronically Signed   By: Van Clines M.D.   On: 01/22/2018 18:04    Review of Systems  Constitutional: Negative for chills and fever.  Respiratory: Negative for sputum production and shortness of breath.        Copd  Cardiovascular: Negative for chest pain.  Gastrointestinal:       Colitis  Genitourinary:       Pos. Colon cancer  Musculoskeletal: Positive for falls.       Previous ORIF distal humerus 2 yrs ago  Neurological: Negative.   14 pt otherwise neg as it pertains to HPI  There were no vitals taken for this visit. Physical Exam  Constitutional: She is oriented to person, place, and time. She appears well-developed and well-nourished.  HENT:  Head: Normocephalic.  Eyes: Pupils are equal, round, and reactive to light.  Neck: Normal range of motion.  Cardiovascular:  Normal rate and regular rhythm.  Respiratory: Effort normal. She has no wheezes. She has no rales.  GI: Soft. Bowel sounds are normal.  Musculoskeletal:  Left arm forearm compartments soft. Median, ulnar sensation and motor normal   Neurological: She is alert and oriented to person, place, and time.  Skin: Skin is warm.  Multiple left arm bruises, right arm bruises. Leg bruises. Thin skin  Psychiatric: Her behavior is normal. Judgment and thought content normal.     Assessment/Plan Plan ORIF left distal humerus with olecranon osteotomy discussed. For surgery Monday afternoon. Risks of ulnar nerve problems, nonunion, reoperation. Infection. anestheticproblems discussed. 1 to 2 night stay in hospital discussed. Posterior splint applied with Ortho tech to improve allignment with extra padding due to thin skin. Son at bedside who is available for her care.  ?'s were elicited and answered, she agrees to proceed.    [name], MD 01/22/2018, 11:00 PM

## 2018-01-22 NOTE — H&P (View-Only) (Signed)
Virginia Burns is an 71 y.o. female.   Chief Complaint: left distal humerus transcondylar humerus fracture, closed and displaced with angulation HPI: 71 yo  Female with fall at home yesterday with left elbow pain and transcondylar closed , displaced distal humerus fracture.   Past Medical History:  Diagnosis Date  . Allergic rhinitis   . Asthma   . Complication of anesthesia    [employer]- hard time working - "dinging for days" - Colonoscopy  . COPD (chronic obstructive pulmonary disease) ([facility])   . Diverticulosis of colon (without mention of hemorrhage)   . Emphysema of lung ([facility])   . Family history of adverse reaction to anesthesia    Mother- hard to awaken and confusion.  . Family history of malignant neoplasm of gastrointestinal tract   . Gastroparesis   . GERD (gastroesophageal reflux disease)   . Hypertension   . On home oxygen therapy    oxygen at night  . Osteoporosis   . Other esophagitis   . Personal history of colonic polyps 10/07/2009   tubular adenoma  . Shortness of breath dyspnea   . Vitamin B12 deficiency     Past Surgical History:  Procedure Laterality Date  . ABDOMINAL HYSTERECTOMY    . CATARACT EXTRACTION Right   . CHOLECYSTECTOMY    . COLONOSCOPY WITH PROPOFOL N/A 11/21/2014   Procedure: COLONOSCOPY WITH PROPOFOL;  Surgeon: John N Perry, MD;  [facility];  Service: Endoscopy;  Laterality: N/A;  . GALLBLADDER SURGERY  1986  . LIGAMENT REPAIR Left 04/18/2013   Procedure: Triangular Fibrocartilage complex open repair;  Surgeon: David A Thompson, MD;  Location: [facility];  Service: Orthopedics;  Laterality: Left;  . OOPHORECTOMY    . OPEN REDUCTION INTERNAL FIXATION (ORIF) DISTAL RADIAL FRACTURE Left 04/18/2013   Procedure: LEFT OPEN REDUCTION INTERNAL FIXATION (ORIF) DISTAL RADIAL FRACTURE;  Surgeon: David A Thompson, MD;  Location: [facility];  Service: Orthopedics;  Laterality: Left;    Family History  Problem Relation  Age of Onset  . COPD Mother        Deceased, 84  . Tremor Mother   . Colon cancer Cousin   . Diabetes Brother   . Colitis Son   . Tremor Maternal Aunt   . Other Father        Deceased, 79   Social History:  reports that she quit smoking about 8 years ago. Her smoking use included cigarettes. She has a 25.00 pack-year smoking history. She has never used smokeless tobacco. She reports that she drinks about 14.0 standard drinks of alcohol per week. She reports that she does not use drugs.  Allergies:  Allergies  Allergen Reactions  . Sulfur Rash  . Clindamycin/Lincomycin Hives     (Not in a hospital admission)  Results for orders placed or performed during the hospital encounter of 01/22/18 (from the past 48 hour(s))  CBC with Differential/Platelet     Status: Abnormal   Collection Time: 01/22/18  4:20 PM  Result Value Ref Range   WBC 15.2 (H) 4.0 - 10.5 K/uL   RBC 4.01 3.87 - 5.11 MIL/uL   Hemoglobin 12.7 12.0 - 15.0 g/dL   HCT 36.9 36.0 - 46.0 %   MCV 92.0 78.0 - 100.0 fL   MCH 31.7 26.0 - 34.0 pg   MCHC 34.4 30.0 - 36.0 g/dL   RDW 13.0 11.5 - 15.5 %   Platelets 435 (H) 150 - 400 K/uL   Neutrophils Relative % 78 %     Neutro Abs 11.8 (H) 1.7 - 7.7 K/uL   Lymphocytes Relative 8 %   Lymphs Abs 1.2 0.7 - 4.0 K/uL   Monocytes Relative 14 %   Monocytes Absolute 2.1 (H) 0.1 - 1.0 K/uL   Eosinophils Relative 0 %   Eosinophils Absolute 0.0 0.0 - 0.7 K/uL   Basophils Relative 0 %   Basophils Absolute 0.0 0.0 - 0.1 K/uL    Comment: Performed at [facility], [address] [place], [place] [address]  Basic metabolic panel     Status: Abnormal   Collection Time: 01/22/18  4:20 PM  Result Value Ref Range   Sodium 128 (L) 135 - 145 mmol/L   Potassium 3.7 3.5 - 5.1 mmol/L   Chloride 90 (L) 98 - 111 mmol/L   CO2 25 22 - 32 mmol/L   Glucose, Bld 104 (H) 70 - 99 mg/dL   BUN 14 8 - 23 mg/dL   Creatinine, Ser 0.59 0.44 - 1.00 mg/dL   Calcium 9.0 8.9 - 10.3 mg/dL    GFR calc non Af Amer >60 >60 mL/min   GFR calc Af Amer >60 >60 mL/min    Comment: (NOTE) The eGFR has been calculated using the CKD EPI equation. This calculation has not been validated in all clinical situations. eGFR's persistently <60 mL/min signify possible Chronic Kidney Disease.    Anion gap 13 5 - 15    Comment: Performed at [facility], [address] [place], [place] [address]   Dg Lumbar Spine Complete  Result Date: 01/22/2018 CLINICAL DATA:  Mid lumbosacral spine pain post fall. EXAM: LUMBAR SPINE - COMPLETE 4+ VIEW COMPARISON:  Body CT 09/15/2017 FINDINGS: There is no evidence of lumbar spine fracture. Alignment is normal. Mild height loss of T12, L1, L3, L4, favored to be degenerative in etiology. Heavy calcific atherosclerotic disease of the aorta. Gas is distension of the abdomen. IMPRESSION: No definite evidence of lumbosacral spine fracture. Mild height loss of T12, L1, L3, L4, favored to be degenerative in etiology. Electronically Signed   By: Dobrinka  Dimitrova M.D.   On: 01/22/2018 14:34   Dg Elbow 2 Views Left  Result Date: 01/22/2018 CLINICAL DATA:  Left elbow pain following a fall last night. EXAM: LEFT ELBOW - 2 VIEW COMPARISON:  None. FINDINGS: Transcondylar fracture of the distal humerus with 1 shaft width of anterior displacement, 3 cm of proximal displacement and anterior angulation of the distal fragment. Associated posterior soft tissue swelling. IMPRESSION: Markedly displaced and angulated transcondylar fracture of the distal humerus, as described above. Electronically Signed   By: Steven  Reid M.D.   On: 01/22/2018 14:51   Ct Elbow Left Wo Contrast  Result Date: 01/22/2018 CLINICAL DATA:  Distal humeral fracture. EXAM: CT OF THE UPPER LEFT EXTREMITY WITHOUT CONTRAST TECHNIQUE: Multidetector CT imaging of the upper left extremity was performed according to the standard protocol. COMPARISON:  None. FINDINGS: Extensive motion artifact. Poor  signal to noise ratio. Difficulty obtaining adequate reconstruction planes. Bones/Joint/Cartilage Displaced supracondylar fracture of the distal humerus with the distal fragment displaced about 1.5 cm anterior and overlapped by about 2.5 cm. There is resulting apex posterior angulation. The fractured distal metaphyseal margin of the humeral shaft partially articulates with the anterior-distal margin of the ulnar trochlea. Irregularities in the radius and proximal ulna are thought to be due to motion artifact rather than a true fracture. Ligaments Suboptimally assessed by CT. Muscles and Tendons Tendon such as the biceps are difficult to assess due   to positioning difficulties. Soft tissues Notable surrounding soft tissue swelling. IMPRESSION: 1. Displaced supracondylar fracture, with the distal fragment displaced about 1.5 cm anteriorly and overlapped by about 2.5 cm. No definite additional fracture although there is extensive motion artifact which partially obscures the radius and ulna, leading to a go stated and double contour appearance markedly reducing sensitivity for subtle fractures of the radius and ulna. Electronically Signed   By: Walter  Liebkemann M.D.   On: 01/22/2018 18:04    Review of Systems  Constitutional: Negative for chills and fever.  Respiratory: Negative for sputum production and shortness of breath.        Copd  Cardiovascular: Negative for chest pain.  Gastrointestinal:       Colitis  Genitourinary:       Pos. Colon cancer  Musculoskeletal: Positive for falls.       Previous ORIF distal humerus 2 yrs ago  Neurological: Negative.   14 pt otherwise neg as it pertains to HPI  There were no vitals taken for this visit. Physical Exam  Constitutional: She is oriented to person, place, and time. She appears well-developed and well-nourished.  HENT:  Head: Normocephalic.  Eyes: Pupils are equal, round, and reactive to light.  Neck: Normal range of motion.  Cardiovascular:  Normal rate and regular rhythm.  Respiratory: Effort normal. She has no wheezes. She has no rales.  GI: Soft. Bowel sounds are normal.  Musculoskeletal:  Left arm forearm compartments soft. Median, ulnar sensation and motor normal   Neurological: She is alert and oriented to person, place, and time.  Skin: Skin is warm.  Multiple left arm bruises, right arm bruises. Leg bruises. Thin skin  Psychiatric: Her behavior is normal. Judgment and thought content normal.     Assessment/Plan Plan ORIF left distal humerus with olecranon osteotomy discussed. For surgery Monday afternoon. Risks of ulnar nerve problems, nonunion, reoperation. Infection. anestheticproblems discussed. 1 to 2 night stay in hospital discussed. Posterior splint applied with Ortho tech to improve allignment with extra padding due to thin skin. Son at bedside who is available for her care.  ?'s were elicited and answered, she agrees to proceed.    Quavis Klutz C Brinley Rosete, MD 01/22/2018, 11:00 PM   

## 2018-01-22 NOTE — ED Notes (Signed)
Pt had mechanical fall, tripping over threshold in bathroom. Pt states she was unable to get off the floor due to pain.

## 2018-01-22 NOTE — H&P (Deleted)
Virginia Burns is an 72 y.o. female.   Chief Complaint: *** HPI: ***  Past Medical History:  Diagnosis Date  . Allergic rhinitis   . Asthma   . Complication of anesthesia    Versed- hard time working - "dinging for days" - Colonoscopy  . COPD (chronic obstructive pulmonary disease) ([place])   . Diverticulosis of colon (without mention of hemorrhage)   . Emphysema of lung ([place])   . Family history of adverse reaction to anesthesia    Mother- hard to awaken and confusion.  . Family history of malignant neoplasm of gastrointestinal tract   . Gastroparesis   . GERD (gastroesophageal reflux disease)   . Hypertension   . On home oxygen therapy    oxygen at night  . Osteoporosis   . Other esophagitis   . Personal history of colonic polyps 10/07/2009   tubular adenoma  . Shortness of breath dyspnea   . Vitamin B12 deficiency     Past Surgical History:  Procedure Laterality Date  . ABDOMINAL HYSTERECTOMY    . CATARACT EXTRACTION Right   . CHOLECYSTECTOMY    . COLONOSCOPY WITH PROPOFOL N/A 11/21/2014   Procedure: COLONOSCOPY WITH PROPOFOL;  Surgeon: Irene Shipper, MD;  Location: [facility];  Service: Endoscopy;  Laterality: N/A;  . [place]  . LIGAMENT REPAIR Left 04/18/2013   Procedure: Triangular Fibrocartilage complex open repair;  Surgeon: Jolyn Nap, MD;  Location: [facility];  Service: Orthopedics;  Laterality: Left;  . OOPHORECTOMY    . OPEN REDUCTION INTERNAL FIXATION (ORIF) DISTAL RADIAL FRACTURE Left 04/18/2013   Procedure: LEFT OPEN REDUCTION INTERNAL FIXATION (ORIF) DISTAL RADIAL FRACTURE;  Surgeon: Jolyn Nap, MD;  Location: [facility];  Service: Orthopedics;  Laterality: Left;    Family History  Problem Relation Age of Onset  . COPD Mother        Deceased, 11  . Tremor Mother   . Colon cancer Cousin   . Diabetes Brother   . Colitis Son   . Tremor Maternal Aunt   . Other Father        Deceased, 79    Social History:  reports that she quit smoking about 8 years ago. Her smoking use included cigarettes. She has a 25.00 pack-year smoking history. She has never used smokeless tobacco. She reports that she drinks about 14.0 standard drinks of alcohol per week. She reports that she does not use drugs.  Allergies:  Allergies  Allergen Reactions  . Sulfur Rash  . Clindamycin/Lincomycin Hives     (Not in a hospital admission)  Results for orders placed or performed during the hospital encounter of 01/22/18 (from the past 48 hour(s))  CBC with Differential/Platelet     Status: Abnormal   Collection Time: 01/22/18  4:20 PM  Result Value Ref Range   WBC 15.2 (H) 4.0 - 10.5 K/uL   RBC 4.01 3.87 - 5.11 MIL/uL   Hemoglobin 12.7 12.0 - 15.0 g/dL   HCT 36.9 36.0 - 46.0 %   MCV 92.0 78.0 - 100.0 fL   MCH 31.7 26.0 - 34.0 pg   MCHC 34.4 30.0 - 36.0 g/dL   RDW 13.0 11.5 - 15.5 %   Platelets 435 (H) 150 - 400 K/uL   Neutrophils Relative % 78 %   Neutro Abs 11.8 (H) 1.7 - 7.7 K/uL   Lymphocytes Relative 8 %   Lymphs Abs 1.2 0.7 - 4.0 K/uL   Monocytes Relative 14 %  Monocytes Absolute 2.1 (H) 0.1 - 1.0 K/uL   Eosinophils Relative 0 %   Eosinophils Absolute 0.0 0.0 - 0.7 K/uL   Basophils Relative 0 %   Basophils Absolute 0.0 0.0 - 0.1 K/uL    Comment: Performed at [facility], [place] [address], [place], [place] [address]  Basic metabolic panel     Status: Abnormal   Collection Time: 01/22/18  4:20 PM  Result Value Ref Range   Sodium 128 (L) 135 - 145 mmol/L   Potassium 3.7 3.5 - 5.1 mmol/L   Chloride 90 (L) 98 - 111 mmol/L   CO2 25 22 - 32 mmol/L   Glucose, Bld 104 (H) 70 - 99 mg/dL   BUN 14 8 - 23 mg/dL   Creatinine, Ser 0.59 0.44 - 1.00 mg/dL   Calcium 9.0 8.9 - 10.3 mg/dL   GFR calc non Af Amer >60 >60 mL/min   GFR calc Af Amer >60 >60 mL/min    Comment: (NOTE) The eGFR has been calculated using the CKD EPI equation. This calculation has not been validated in  all clinical situations. eGFR's persistently <60 mL/min signify possible Chronic Kidney Disease.    Anion gap 13 5 - 15    Comment: Performed at [facility], [place] [address], [place], [place] [address]   Dg Lumbar Spine Complete  Result Date: 01/22/2018 CLINICAL DATA:  Mid lumbosacral spine pain post fall. EXAM: LUMBAR SPINE - COMPLETE 4+ VIEW COMPARISON:  Body CT 09/15/2017 FINDINGS: There is no evidence of lumbar spine fracture. Alignment is normal. Mild height loss of T12, L1, L3, L4, favored to be degenerative in etiology. Heavy calcific atherosclerotic disease of the aorta. Gas is distension of the abdomen. IMPRESSION: No definite evidence of lumbosacral spine fracture. Mild height loss of T12, L1, L3, L4, favored to be degenerative in etiology. Electronically Signed   By: Fidela Salisbury M.D.   On: 01/22/2018 14:34   Dg Elbow 2 Views Left  Result Date: 01/22/2018 CLINICAL DATA:  Left elbow pain following a fall last night. EXAM: LEFT ELBOW - 2 VIEW COMPARISON:  None. FINDINGS: Transcondylar fracture of the distal humerus with 1 shaft width of anterior displacement, 3 cm of proximal displacement and anterior angulation of the distal fragment. Associated posterior soft tissue swelling. IMPRESSION: Markedly displaced and angulated transcondylar fracture of the distal humerus, as described above. Electronically Signed   By: Claudie Revering M.D.   On: 01/22/2018 14:51   Ct Elbow Left Wo Contrast  Result Date: 01/22/2018 CLINICAL DATA:  Distal humeral fracture. EXAM: CT OF THE UPPER LEFT EXTREMITY WITHOUT CONTRAST TECHNIQUE: Multidetector CT imaging of the upper left extremity was performed according to the standard protocol. COMPARISON:  None. FINDINGS: Extensive motion artifact. Poor signal to noise ratio. Difficulty obtaining adequate reconstruction planes. Bones/Joint/Cartilage Displaced supracondylar fracture of the distal humerus with the distal fragment displaced about 1.5 cm  anterior and overlapped by about 2.5 cm. There is resulting apex posterior angulation. The fractured distal metaphyseal margin of the humeral shaft partially articulates with the anterior-distal margin of the ulnar trochlea. Irregularities in the radius and proximal ulna are thought to be due to motion artifact rather than a true fracture. Ligaments Suboptimally assessed by CT. Muscles and Tendons Tendon such as the biceps are difficult to assess due to positioning difficulties. Soft tissues Notable surrounding soft tissue swelling. IMPRESSION: 1. Displaced supracondylar fracture, with the distal fragment displaced about 1.5 cm anteriorly and overlapped by about 2.5 cm. No  definite additional fracture although there is extensive motion artifact which partially obscures the radius and ulna, leading to a go stated and double contour appearance markedly reducing sensitivity for subtle fractures of the radius and ulna. Electronically Signed   By: Van Clines M.D.   On: 01/22/2018 18:04    ROS  There were no vitals taken for this visit. Physical Exam   Assessment/Plan ***  [name], MD 01/22/2018, 10:51 PM

## 2018-01-22 NOTE — ED Notes (Signed)
Bed: ID02 Expected date: 01/22/18 Expected time: 12:15 PM Means of arrival: Ambulance Comments: Fall, rt shoulder injury

## 2018-01-22 NOTE — ED Notes (Signed)
Ortho tech called 

## 2018-01-22 NOTE — ED Provider Notes (Signed)
I received this patient in signout from Dr. Zenia Resides.  She had a displaced supracondylar fracture for which orthopedics, Dr. Lorin Mercy have been consulted and we were awaiting recommendations.  We are also awaiting lab work results.  Labs show WBC 15.2, likely stress response due to acute injury.  His sodium 128 and chloride 90.  I discussed mild hyponatremia with the patient and her son.  Son states that this is a chronic problem for her as she has problems drinking too much water.  Her PCP has discussed this problem with her before.  I emphasized the importance of water restriction on close follow-up with PCP for recheck of sodium to ensure that hyponatremia resolves.  Regarding her elbow injury.  She was placed in a long-arm splint.  Dr. Inda Merlin reviewed CT and has arranged for surgery on Monday.  He has discussed plans with patient and son who have voiced understanding.  I discussed supportive measures at home and reviewed return precautions.  They voiced understanding.   Little, Wenda Overland, MD 01/22/18 1922

## 2018-01-22 NOTE — ED Provider Notes (Signed)
[facility] DEPT Provider Note   CSN: [id number] Arrival date & time: 01/22/18  1216     History   Chief Complaint Chief Complaint  Patient presents with  . Fall    HPI Virginia Burns is a 72 y.o. female.  72 year old female presents after mechanical fall last evening.  She fell onto her left elbow.  Denies any head or neck injury from this.  No abdominal or chest discomfort.  Does note some low back pain without radicular symptoms.  Denies any hip or lower extremity pain at this time.  No pain to her left hand left wrist.  Pain in her left elbow is dull and worse with any movement.  Was placed into a sling and transported here.     Past Medical History:  Diagnosis Date  . Allergic rhinitis   . Asthma   . Complication of anesthesia    Versed- hard time working - "dinging for days" - Colonoscopy  . COPD (chronic obstructive pulmonary disease) ([place])   . Diverticulosis of colon (without mention of hemorrhage)   . Emphysema of lung ([place])   . Family history of adverse reaction to anesthesia    Mother- hard to awaken and confusion.  . Family history of malignant neoplasm of gastrointestinal tract   . Gastroparesis   . GERD (gastroesophageal reflux disease)   . Hypertension   . On home oxygen therapy    oxygen at night  . Osteoporosis   . Other esophagitis   . Personal history of colonic polyps 10/07/2009   tubular adenoma  . Shortness of breath dyspnea   . Vitamin B12 deficiency     Patient Active Problem List   Diagnosis Date Noted  . Chronic respiratory failure ([place]) 03/04/2015  . Hx of colonic polyps   . Benign neoplasm of ascending colon   . Benign neoplasm of descending colon   . OSA (obstructive sleep apnea) 09/18/2013  . History of tobacco abuse 09/18/2013  . Benign essential tremor 05/22/2013  . COPD (chronic obstructive pulmonary disease) ([place]) 02/10/2013  . EROSIVE ESOPHAGITIS 10/12/2007  . HYPERTENSION 07/19/2007     Past Surgical History:  Procedure Laterality Date  . ABDOMINAL HYSTERECTOMY    . CATARACT EXTRACTION Right   . CHOLECYSTECTOMY    . COLONOSCOPY WITH PROPOFOL N/A 11/21/2014   Procedure: COLONOSCOPY WITH PROPOFOL;  Surgeon: Irene Shipper, MD;  Location: [facility];  Service: Endoscopy;  Laterality: N/A;  . [place]  . LIGAMENT REPAIR Left 04/18/2013   Procedure: Triangular Fibrocartilage complex open repair;  Surgeon: Jolyn Nap, MD;  Location: [facility];  Service: Orthopedics;  Laterality: Left;  . OOPHORECTOMY    . OPEN REDUCTION INTERNAL FIXATION (ORIF) DISTAL RADIAL FRACTURE Left 04/18/2013   Procedure: LEFT OPEN REDUCTION INTERNAL FIXATION (ORIF) DISTAL RADIAL FRACTURE;  Surgeon: Jolyn Nap, MD;  Location: [facility];  Service: Orthopedics;  Laterality: Left;     OB History   None      Home Medications    Prior to Admission medications   Medication Sig Start Date End Date Taking? Authorizing Provider  albuterol (PROAIR HFA) 108 (90 BASE) MCG/ACT inhaler Inhale 2 puffs into the lungs every 6 (six) hours as needed for wheezing or shortness of breath.    [provider]  AMBULATORY NON FORMULARY MEDICATION Pt on Oxygen, 2 units, at night    [provider]  AMBULATORY NON FORMULARY MEDICATION Medication Name: Domperidone 10  mg                               Please take one tablet by mouth three times daily before meals 09/09/17   Irene Shipper, MD  amLODipine (NORVASC) 5 MG tablet Take 7.5 mg by mouth daily.     [provider]  budesonide-formoterol (SYMBICORT) 160-4.5 MCG/ACT inhaler Inhale 2 puffs into the lungs 2 (two) times daily. 01/04/18   Chesley Mires, MD  budesonide-formoterol (SYMBICORT) 160-4.5 MCG/ACT inhaler Inhale 2 puffs into the lungs 2 (two) times daily for 1 day. 01/04/18 01/05/18  Chesley Mires, MD  Calcium Carbonate-Vitamin D (CALCIUM 500 + D PO) Take 1 tablet by mouth daily.     [provider]  CARAFATE 1 GM/10ML suspension TAKE 2 TEASPOONFUL (10 MLS) BY MOUTH  4 TIMES DAILY WITH MEALS AND AT BEDTIME 12/23/17   Irene Shipper, MD  cholecalciferol (VITAMIN D) 1000 UNITS tablet Take 1,000 Units by mouth daily.    [provider]  estradiol (ESTRACE VAGINAL) 0.1 MG/GM vaginal cream as needed. Frequency:to area 2-3 times weekly   Dosage:0.1   MG/GM  Instructions:Estrace 0.1MG /GM, 1 application to area 2-3 times weekly  Note: 03/07/13   [provider]  Flaxseed, Linseed, (FLAXSEED OIL PO) Take 1 tablet by mouth daily.    [provider]  guaiFENesin (MUCINEX) 600 MG 12 hr tablet Take 1 tablet (600 mg total) by mouth 2 (two) times daily. For 5 days Patient taking differently: Take 600 mg by mouth daily.  12/31/12   Domenic Polite, MD  LORazepam (ATIVAN) 0.5 MG tablet Take 0.5 mg by mouth as needed for anxiety.     [provider]  pantoprazole (PROTONIX) 40 MG tablet Take 40 mg by mouth 2 (two) times daily.     [provider]  Probiotic Product (VSL#3) CAPS Take 1 capsule by mouth daily. 04/11/13   Sable Feil, MD  tiotropium (SPIRIVA) 18 MCG inhalation capsule Place 1 capsule (18 mcg total) into inhaler and inhale daily. 01/04/18   Chesley Mires, MD  vitamin C (ASCORBIC ACID) 500 MG tablet Take 500 mg by mouth as needed.     [provider]  Wound Dressings (SAF-GEL) GEL Apply 1 application topically Two (2) times a day. 03/28/14   [provider]    Family History Family History  Problem Relation Age of Onset  . COPD Mother        Deceased, 50  . Tremor Mother   . Colon cancer Cousin   . Diabetes Brother   . Colitis Son   . Tremor Maternal Aunt   . Other Father        Deceased, 59    Social History Social History   Tobacco Use  . Smoking status: Former Smoker    Packs/day: 1.00    Years: 25.00    Pack years: 25.00    Types: Cigarettes    Last attempt to quit: 05/18/2009    Years  since quitting: 8.6  . Smokeless tobacco: Never Used  Substance Use Topics  . Alcohol use: Yes    Alcohol/week: 14.0 standard drinks    Types: 14 Glasses of wine per week  . Drug use: No     Allergies   Sulfur and Clindamycin/lincomycin   Review of Systems Review of Systems  All other systems reviewed and are negative.    Physical Exam Updated Vital Signs BP  133/65   Pulse 87   Temp 98 F (36.7 C) (Oral)   Resp 16   Ht 1.651 m (5\' 5" )   Wt 77.1 kg   SpO2 95%   BMI 28.29 kg/m   Physical Exam  Constitutional: She is oriented to person, place, and time. She appears well-developed and well-nourished.  Non-toxic appearance. No distress.  HENT:  Head: Normocephalic and atraumatic.  Eyes: Pupils are equal, round, and reactive to light. Conjunctivae, EOM and lids are normal.  Neck: Normal range of motion. Neck supple. No tracheal deviation present. No thyroid mass present.  Cardiovascular: Normal rate, regular rhythm and normal heart sounds. Exam reveals no gallop.  No murmur heard. Pulmonary/Chest: Effort normal and breath sounds normal. No stridor. No respiratory distress. She has no decreased breath sounds. She has no wheezes. She has no rhonchi. She has no rales.  Abdominal: Soft. Normal appearance and bowel sounds are normal. She exhibits no distension. There is no tenderness. There is no rebound and no CVA tenderness.  Musculoskeletal: She exhibits no edema or tenderness.       Left elbow: She exhibits decreased range of motion, swelling and effusion.       Back:       Arms: Neurological: She is alert and oriented to person, place, and time. She has normal strength. No cranial nerve deficit or sensory deficit. GCS eye subscore is 4. GCS verbal subscore is 5. GCS motor subscore is 6.  Skin: Skin is warm and dry. No abrasion and no rash noted.  Psychiatric: She has a normal mood and affect. Her speech is normal and behavior is normal.  Nursing note and vitals  reviewed.    ED Treatments / Results  Labs (all labs ordered are listed, but only abnormal results are displayed) Labs Reviewed - No data to display  EKG None  Radiology No results found.  Procedures Procedures (including critical care time)  Medications Ordered in ED Medications  morphine 4 MG/ML injection 4 mg (has no administration in time range)     Initial Impression / Assessment and Plan / ED Course  I have reviewed the triage vital signs and the nursing notes.  Pertinent labs & imaging results that were available during my care of the patient were reviewed by me and considered in my medical decision making (see chart for details).     Negative for pain here.  X-ray results noted and discussed with Dr. Lorin Mercy.  He requested a CT of the elbow and he will follow-up on those results.  Also requested preop laboratory studies.  Care signed out to Dr. Rex Kras  Final Clinical Impressions(s) / ED Diagnoses   Final diagnoses:  None    ED Discharge Orders    None       Lacretia Leigh, MD 01/22/18 1600

## 2018-01-22 NOTE — ED Triage Notes (Signed)
-  Pt arrived via [facility] last night around 12 AM, from home -Son found her this AM around 10:20 AM, been on the floor all night  -pain to L arm, swelling at elbow, obvious deformity to distal ulna, contusion to L cheek -EMS placed C-collar -Arm in splint and sling -150 fentanyl in route  -Pt is ambulatory

## 2018-01-24 ENCOUNTER — Encounter (HOSPITAL_COMMUNITY)
Admission: AD | Disposition: A | Discharge status: Home Health | Payer: Self-pay | Source: Home / Self Care | Attending: Orthopaedic Surgery

## 2018-01-24 ENCOUNTER — Ambulatory Visit (HOSPITAL_COMMUNITY): Payer: PPO | Admitting: Anesthesiology

## 2018-01-24 ENCOUNTER — Inpatient Hospital Stay (HOSPITAL_COMMUNITY)
Admission: AD | Admit: 2018-01-24 | Discharge: 2018-01-27 | DRG: 493 | Disposition: A | Discharge status: Home Health | Payer: PPO | Attending: Orthopaedic Surgery | Admitting: Orthopaedic Surgery

## 2018-01-24 ENCOUNTER — Ambulatory Visit (HOSPITAL_COMMUNITY): Payer: PPO

## 2018-01-24 ENCOUNTER — Encounter (HOSPITAL_COMMUNITY): Payer: Self-pay | Admitting: Anesthesiology

## 2018-01-24 ENCOUNTER — Other Ambulatory Visit: Payer: Self-pay

## 2018-01-24 DIAGNOSIS — K210 Gastro-esophageal reflux disease with esophagitis: Secondary | ICD-10-CM | POA: Diagnosis present

## 2018-01-24 DIAGNOSIS — G8918 Other acute postprocedural pain: Secondary | ICD-10-CM | POA: Diagnosis not present

## 2018-01-24 DIAGNOSIS — Z825 Family history of asthma and other chronic lower respiratory diseases: Secondary | ICD-10-CM

## 2018-01-24 DIAGNOSIS — S42402A Unspecified fracture of lower end of left humerus, initial encounter for closed fracture: Secondary | ICD-10-CM

## 2018-01-24 DIAGNOSIS — E871 Hypo-osmolality and hyponatremia: Secondary | ICD-10-CM | POA: Diagnosis present

## 2018-01-24 DIAGNOSIS — Z881 Allergy status to other antibiotic agents status: Secondary | ICD-10-CM

## 2018-01-24 DIAGNOSIS — J449 Chronic obstructive pulmonary disease, unspecified: Secondary | ICD-10-CM | POA: Diagnosis not present

## 2018-01-24 DIAGNOSIS — G4733 Obstructive sleep apnea (adult) (pediatric): Secondary | ICD-10-CM | POA: Diagnosis present

## 2018-01-24 DIAGNOSIS — M810 Age-related osteoporosis without current pathological fracture: Secondary | ICD-10-CM | POA: Diagnosis present

## 2018-01-24 DIAGNOSIS — Z882 Allergy status to sulfonamides status: Secondary | ICD-10-CM | POA: Diagnosis not present

## 2018-01-24 DIAGNOSIS — Z800 Family history of malignant neoplasm of digestive organs: Secondary | ICD-10-CM

## 2018-01-24 DIAGNOSIS — Z79899 Other long term (current) drug therapy: Secondary | ICD-10-CM

## 2018-01-24 DIAGNOSIS — M545 Low back pain: Secondary | ICD-10-CM | POA: Diagnosis present

## 2018-01-24 DIAGNOSIS — Y92009 Unspecified place in unspecified non-institutional (private) residence as the place of occurrence of the external cause: Secondary | ICD-10-CM

## 2018-01-24 DIAGNOSIS — Z833 Family history of diabetes mellitus: Secondary | ICD-10-CM | POA: Diagnosis not present

## 2018-01-24 DIAGNOSIS — Z87891 Personal history of nicotine dependence: Secondary | ICD-10-CM | POA: Diagnosis not present

## 2018-01-24 DIAGNOSIS — E538 Deficiency of other specified B group vitamins: Secondary | ICD-10-CM | POA: Diagnosis present

## 2018-01-24 DIAGNOSIS — W1830XA Fall on same level, unspecified, initial encounter: Secondary | ICD-10-CM | POA: Diagnosis present

## 2018-01-24 DIAGNOSIS — S42492A Other displaced fracture of lower end of left humerus, initial encounter for closed fracture: Secondary | ICD-10-CM | POA: Diagnosis not present

## 2018-01-24 DIAGNOSIS — Z7951 Long term (current) use of inhaled steroids: Secondary | ICD-10-CM

## 2018-01-24 DIAGNOSIS — K5900 Constipation, unspecified: Secondary | ICD-10-CM

## 2018-01-24 DIAGNOSIS — I10 Essential (primary) hypertension: Secondary | ICD-10-CM | POA: Diagnosis present

## 2018-01-24 DIAGNOSIS — S42412A Displaced simple supracondylar fracture without intercondylar fracture of left humerus, initial encounter for closed fracture: Secondary | ICD-10-CM | POA: Diagnosis not present

## 2018-01-24 DIAGNOSIS — Z9049 Acquired absence of other specified parts of digestive tract: Secondary | ICD-10-CM

## 2018-01-24 DIAGNOSIS — Z9841 Cataract extraction status, right eye: Secondary | ICD-10-CM

## 2018-01-24 DIAGNOSIS — J439 Emphysema, unspecified: Secondary | ICD-10-CM | POA: Diagnosis present

## 2018-01-24 DIAGNOSIS — S42472A Displaced transcondylar fracture of left humerus, initial encounter for closed fracture: Secondary | ICD-10-CM | POA: Diagnosis present

## 2018-01-24 DIAGNOSIS — K5730 Diverticulosis of large intestine without perforation or abscess without bleeding: Secondary | ICD-10-CM | POA: Diagnosis present

## 2018-01-24 DIAGNOSIS — S42409A Unspecified fracture of lower end of unspecified humerus, initial encounter for closed fracture: Secondary | ICD-10-CM

## 2018-01-24 DIAGNOSIS — T1490XA Injury, unspecified, initial encounter: Secondary | ICD-10-CM

## 2018-01-24 DIAGNOSIS — Z90710 Acquired absence of both cervix and uterus: Secondary | ICD-10-CM | POA: Diagnosis not present

## 2018-01-24 DIAGNOSIS — Z86010 Personal history of colonic polyps: Secondary | ICD-10-CM | POA: Diagnosis not present

## 2018-01-24 DIAGNOSIS — Z9981 Dependence on supplemental oxygen: Secondary | ICD-10-CM | POA: Diagnosis not present

## 2018-01-24 HISTORY — DX: Unspecified fracture of lower end of unspecified humerus, initial encounter for closed fracture: S42.409A

## 2018-01-24 HISTORY — DX: Urinary tract infection, site not specified: N39.0

## 2018-01-24 HISTORY — PX: ORIF ELBOW FRACTURE: SHX5031

## 2018-01-24 SURGERY — OPEN REDUCTION INTERNAL FIXATION (ORIF) ELBOW/OLECRANON FRACTURE
Anesthesia: General | Site: Elbow | Laterality: Left

## 2018-01-24 MED ORDER — DEXAMETHASONE SODIUM PHOSPHATE 10 MG/ML IJ SOLN
INTRAMUSCULAR | Status: AC
  Filled 2018-01-24: qty 1

## 2018-01-24 MED ORDER — CHLORHEXIDINE GLUCONATE 4 % EX LIQD: 60.0000 mL | Freq: Once | CUTANEOUS | Status: DC

## 2018-01-24 MED ORDER — ONDANSETRON HCL 4 MG/2ML IJ SOLN
INTRAMUSCULAR | Status: AC
  Filled 2018-01-24: qty 2

## 2018-01-24 MED ORDER — MOMETASONE FURO-FORMOTEROL FUM 200-5 MCG/ACT IN AERO
2.0000 | INHALATION_SPRAY | Freq: Two times a day (BID) | RESPIRATORY_TRACT | Status: DC
  Administered 2018-01-24 – 2018-01-27 (×4): 2 via RESPIRATORY_TRACT
  Filled 2018-01-24: qty 8.8

## 2018-01-24 MED ORDER — PROPOFOL 10 MG/ML IV BOLUS
INTRAVENOUS | Status: AC
  Filled 2018-01-24: qty 40

## 2018-01-24 MED ORDER — FENTANYL CITRATE (PF) 100 MCG/2ML IJ SOLN
INTRAMUSCULAR | Status: DC | PRN
  Administered 2018-01-24 (×3): 50 ug via INTRAVENOUS

## 2018-01-24 MED ORDER — DEXAMETHASONE SODIUM PHOSPHATE 10 MG/ML IJ SOLN
INTRAMUSCULAR | Status: DC | PRN
  Administered 2018-01-24: 5 mg via INTRAVENOUS

## 2018-01-24 MED ORDER — METOCLOPRAMIDE HCL 5 MG PO TABS: 5.0000 mg | ORAL_TABLET | Freq: Three times a day (TID) | ORAL | Status: DC | PRN

## 2018-01-24 MED ORDER — DOCUSATE SODIUM 100 MG PO CAPS
100.0000 mg | ORAL_CAPSULE | Freq: Two times a day (BID) | ORAL | Status: DC
  Administered 2018-01-24 – 2018-01-27 (×6): 100 mg via ORAL
  Filled 2018-01-24 (×6): qty 1

## 2018-01-24 MED ORDER — 0.9 % SODIUM CHLORIDE (POUR BTL) OPTIME
TOPICAL | Status: DC | PRN
  Administered 2018-01-24: 1000 mL

## 2018-01-24 MED ORDER — LIDOCAINE 2% (20 MG/ML) 5 ML SYRINGE
INTRAMUSCULAR | Status: DC | PRN
  Administered 2018-01-24: 60 mg via INTRAVENOUS

## 2018-01-24 MED ORDER — METOCLOPRAMIDE HCL 5 MG/ML IJ SOLN: 5.0000 mg | Freq: Three times a day (TID) | INTRAMUSCULAR | Status: DC | PRN

## 2018-01-24 MED ORDER — METOCLOPRAMIDE HCL 5 MG/ML IJ SOLN
10.0000 mg | Freq: Once | INTRAMUSCULAR | Status: AC
  Administered 2018-01-24: 10 mg via INTRAVENOUS
  Filled 2018-01-24: qty 2

## 2018-01-24 MED ORDER — FENTANYL CITRATE (PF) 250 MCG/5ML IJ SOLN
INTRAMUSCULAR | Status: AC
  Filled 2018-01-24: qty 5

## 2018-01-24 MED ORDER — MIDAZOLAM HCL 2 MG/2ML IJ SOLN
INTRAMUSCULAR | Status: AC
  Filled 2018-01-24: qty 2

## 2018-01-24 MED ORDER — ONDANSETRON HCL 4 MG/2ML IJ SOLN
INTRAMUSCULAR | Status: DC | PRN
  Administered 2018-01-24: 4 mg via INTRAVENOUS

## 2018-01-24 MED ORDER — ONDANSETRON HCL 4 MG/2ML IJ SOLN: 4.0000 mg | Freq: Four times a day (QID) | INTRAMUSCULAR | Status: DC | PRN

## 2018-01-24 MED ORDER — ROPIVACAINE HCL 7.5 MG/ML IJ SOLN
INTRAMUSCULAR | Status: DC | PRN
  Administered 2018-01-24: 20 mL via PERINEURAL

## 2018-01-24 MED ORDER — ONDANSETRON HCL 4 MG PO TABS: 4.0000 mg | ORAL_TABLET | Freq: Four times a day (QID) | ORAL | Status: DC | PRN

## 2018-01-24 MED ORDER — OXYCODONE HCL 5 MG PO TABS
5.0000 mg | ORAL_TABLET | ORAL | Status: DC | PRN
  Administered 2018-01-25 (×2): 10 mg via ORAL
  Filled 2018-01-24 (×2): qty 2

## 2018-01-24 MED ORDER — HYDROMORPHONE HCL 1 MG/ML IJ SOLN
0.5000 mg | INTRAMUSCULAR | Status: DC | PRN
  Administered 2018-01-25 – 2018-01-26 (×4): 0.5 mg via INTRAVENOUS
  Filled 2018-01-24 (×4): qty 1

## 2018-01-24 MED ORDER — LACTATED RINGERS IV SOLN
INTRAVENOUS | Status: DC
  Administered 2018-01-24 (×2): via INTRAVENOUS

## 2018-01-24 MED ORDER — CEFAZOLIN SODIUM-DEXTROSE 1-4 GM/50ML-% IV SOLN
1.0000 g | Freq: Three times a day (TID) | INTRAVENOUS | Status: AC
  Administered 2018-01-24 – 2018-01-25 (×2): 1 g via INTRAVENOUS
  Filled 2018-01-24 (×2): qty 50

## 2018-01-24 MED ORDER — ROCURONIUM BROMIDE 50 MG/5ML IV SOSY
PREFILLED_SYRINGE | INTRAVENOUS | Status: DC | PRN
  Administered 2018-01-24: 50 mg via INTRAVENOUS

## 2018-01-24 MED ORDER — PROPOFOL 10 MG/ML IV BOLUS
INTRAVENOUS | Status: DC | PRN
  Administered 2018-01-24: 180 mg via INTRAVENOUS

## 2018-01-24 MED ORDER — FENTANYL CITRATE (PF) 100 MCG/2ML IJ SOLN
INTRAMUSCULAR | Status: AC
  Filled 2018-01-24: qty 2

## 2018-01-24 MED ORDER — TIOTROPIUM BROMIDE MONOHYDRATE 18 MCG IN CAPS
18.0000 ug | ORAL_CAPSULE | Freq: Every day | RESPIRATORY_TRACT | Status: DC
  Administered 2018-01-25 – 2018-01-27 (×3): 18 ug via RESPIRATORY_TRACT
  Filled 2018-01-24: qty 5

## 2018-01-24 MED ORDER — CEFAZOLIN SODIUM-DEXTROSE 2-4 GM/100ML-% IV SOLN
2.0000 g | INTRAVENOUS | Status: AC
  Administered 2018-01-24: 2 g via INTRAVENOUS
  Filled 2018-01-24: qty 100

## 2018-01-24 MED ORDER — SUGAMMADEX SODIUM 200 MG/2ML IV SOLN
INTRAVENOUS | Status: DC | PRN
  Administered 2018-01-24: 160 mg via INTRAVENOUS

## 2018-01-24 MED ORDER — BUPIVACAINE HCL (PF) 0.25 % IJ SOLN
INTRAMUSCULAR | Status: AC
  Filled 2018-01-24: qty 30

## 2018-01-24 MED ORDER — METOCLOPRAMIDE HCL 5 MG/ML IJ SOLN
INTRAMUSCULAR | Status: AC
  Administered 2018-01-24: 10 mg via INTRAVENOUS
  Filled 2018-01-24: qty 2

## 2018-01-24 SURGICAL SUPPLY — 86 items
APL SKNCLS STERI-STRIP NONHPOA (GAUZE/BANDAGES/DRESSINGS)
BANDAGE ACE 3X5.8 VEL STRL LF (GAUZE/BANDAGES/DRESSINGS) ×1 IMPLANT
BANDAGE ACE 4X5 VEL STRL LF (GAUZE/BANDAGES/DRESSINGS) ×1 IMPLANT
BANDAGE ACE 6X5 VEL STRL LF (GAUZE/BANDAGES/DRESSINGS) ×1 IMPLANT
BENZOIN TINCTURE PRP APPL 2/3 (GAUZE/BANDAGES/DRESSINGS) IMPLANT
BIT DRILL 2.5X2.75 QC CALB (BIT) ×1 IMPLANT
BIT DRILL 4.8X200 CANN (BIT) ×1 IMPLANT
BIT DRILL CALIBRATED 2.7 (BIT) ×1 IMPLANT
BLADE AVERAGE 25X9 (BLADE) ×2 IMPLANT
BLADE SURG 10 STRL SS (BLADE) IMPLANT
BNDG CMPR 9X4 STRL LF SNTH (GAUZE/BANDAGES/DRESSINGS) ×1
BNDG COHESIVE 3X5 TAN STRL LF (GAUZE/BANDAGES/DRESSINGS) ×2 IMPLANT
BNDG COHESIVE 4X5 TAN STRL (GAUZE/BANDAGES/DRESSINGS) ×2 IMPLANT
BNDG ESMARK 4X9 LF (GAUZE/BANDAGES/DRESSINGS) ×2 IMPLANT
BNDG GAUZE ELAST 4 BULKY (GAUZE/BANDAGES/DRESSINGS) ×1 IMPLANT
CLEANER TIP ELECTROSURG 2X2 (MISCELLANEOUS) ×2 IMPLANT
CORD BIPOLAR FORCEPS 12FT (ELECTRODE) ×1 IMPLANT
COVER SURGICAL LIGHT HANDLE (MISCELLANEOUS) ×2 IMPLANT
CUFF TOURNIQUET SINGLE 18IN (TOURNIQUET CUFF) ×1 IMPLANT
DRAPE C-ARM 42X72 X-RAY (DRAPES) ×1 IMPLANT
DRAPE INCISE IOBAN 66X45 STRL (DRAPES) ×1 IMPLANT
DRAPE U-SHAPE 47X51 STRL (DRAPES) ×2 IMPLANT
DRSG PAD ABDOMINAL 8X10 ST (GAUZE/BANDAGES/DRESSINGS) ×1 IMPLANT
ELECT REM PT RETURN 9FT ADLT (ELECTROSURGICAL) ×2
ELECTRODE REM PT RTRN 9FT ADLT (ELECTROSURGICAL) ×1 IMPLANT
FACESHIELD WRAPAROUND (MASK) IMPLANT
FACESHIELD WRAPAROUND OR TEAM (MASK) IMPLANT
GAUZE SPONGE 4X4 12PLY STRL (GAUZE/BANDAGES/DRESSINGS) ×2 IMPLANT
GAUZE XEROFORM 1X8 LF (GAUZE/BANDAGES/DRESSINGS) ×1 IMPLANT
GAUZE XEROFORM 5X9 LF (GAUZE/BANDAGES/DRESSINGS) ×1 IMPLANT
GLOVE BIOGEL PI IND STRL 8 (GLOVE) ×1 IMPLANT
GLOVE BIOGEL PI INDICATOR 8 (GLOVE) ×1
GLOVE ORTHO TXT STRL SZ7.5 (GLOVE) ×23 IMPLANT
GLOVE SURG SS PI 7.0 STRL IVOR (GLOVE) ×1 IMPLANT
GOWN STRL REUS W/ TWL LRG LVL3 (GOWN DISPOSABLE) ×1 IMPLANT
GOWN STRL REUS W/ TWL XL LVL3 (GOWN DISPOSABLE) ×1 IMPLANT
GOWN STRL REUS W/TWL 2XL LVL3 (GOWN DISPOSABLE) ×2 IMPLANT
GOWN STRL REUS W/TWL LRG LVL3 (GOWN DISPOSABLE) ×2
GOWN STRL REUS W/TWL XL LVL3 (GOWN DISPOSABLE) ×2
K-WIRE FIXATION 2.0X6 (WIRE) ×8
KIT BASIN OR (CUSTOM PROCEDURE TRAY) ×2 IMPLANT
KIT TURNOVER KIT B (KITS) ×2 IMPLANT
KWIRE FIXATION 2.0X6 (WIRE) IMPLANT
LOOP VESSEL MAXI BLUE (MISCELLANEOUS) ×1 IMPLANT
MANIFOLD NEPTUNE II (INSTRUMENTS) ×2 IMPLANT
NDL SUT 6 .5 CRC .975X.05 MAYO (NEEDLE) IMPLANT
NEEDLE MAYO TAPER (NEEDLE) ×2
NS IRRIG 1000ML POUR BTL (IV SOLUTION) ×2 IMPLANT
PACK ORTHO EXTREMITY (CUSTOM PROCEDURE TRAY) ×2 IMPLANT
PAD ARMBOARD 7.5X6 YLW CONV (MISCELLANEOUS) ×4 IMPLANT
PAD CAST 4YDX4 CTTN HI CHSV (CAST SUPPLIES) ×1 IMPLANT
PADDING CAST COTTON 4X4 STRL (CAST SUPPLIES) ×2
PIN GUIDE DRILL TIP 2.8X300 (DRILL) ×1 IMPLANT
PLATE DIST HUM MEDIAL LT SM (Plate) ×1 IMPLANT
PLATE LOCK LT SM (Plate) ×1 IMPLANT
SCREW CANN 6.5X85X40MM THRD (Screw) ×1 IMPLANT
SCREW CORT 3.5X32 (Screw) ×2 IMPLANT
SCREW CORT 3.5X40 (Screw) ×1 IMPLANT
SCREW CORT T15 32X3.5XST LCK (Screw) IMPLANT
SCREW LOCK 3.5X40 DIST TIB (Screw) ×1 IMPLANT
SCREW LOCK CORT STAR 3.5X14 (Screw) ×1 IMPLANT
SCREW LOCK CORT STAR 3.5X16 (Screw) ×4 IMPLANT
SCREW LOCK CORT STAR 3.5X20 (Screw) ×2 IMPLANT
SCREW LOCK CORT STAR 3.5X24 (Screw) ×1 IMPLANT
SCREW LOCK CORT STAR 3.5X40 (Screw) ×1 IMPLANT
SCREW LOW PROFILE 18MMX3.5MM (Screw) ×1 IMPLANT
SCREW LOW PROFILE 22MMX3.5MM (Screw) ×4 IMPLANT
SCREW LP 3.5X44 (Screw) ×1 IMPLANT
SCREW NON LOCKING LP 3.5 16MM (Screw) ×1 IMPLANT
SPONGE LAP 18X18 X RAY DECT (DISPOSABLE) ×4 IMPLANT
STAPLER VISISTAT 35W (STAPLE) ×2 IMPLANT
STOCKINETTE IMPERVIOUS 9X36 MD (GAUZE/BANDAGES/DRESSINGS) ×2 IMPLANT
STRIP CLOSURE SKIN 1/2X4 (GAUZE/BANDAGES/DRESSINGS) IMPLANT
SUCTION FRAZIER HANDLE 10FR (MISCELLANEOUS) ×1
SUCTION TUBE FRAZIER 10FR DISP (MISCELLANEOUS) ×1 IMPLANT
SUT PROLENE 3 0 PS 2 (SUTURE) ×4 IMPLANT
SUT VIC AB 0 CT1 27 (SUTURE) ×4
SUT VIC AB 0 CT1 27XBRD ANBCTR (SUTURE) ×2 IMPLANT
SUT VIC AB 2-0 CT1 27 (SUTURE) ×4
SUT VIC AB 2-0 CT1 TAPERPNT 27 (SUTURE) ×2 IMPLANT
SYR CONTROL 10ML LL (SYRINGE) ×2 IMPLANT
TOWEL OR 17X26 10 PK STRL BLUE (TOWEL DISPOSABLE) ×2 IMPLANT
TUBE CONNECTING 12X1/4 (SUCTIONS) ×2 IMPLANT
WASHER 3.5MM (Orthopedic Implant) ×5 IMPLANT
WASHER FLAT 6.5MM (Washer) ×1 IMPLANT
YANKAUER SUCT BULB TIP NO VENT (SUCTIONS) ×2 IMPLANT

## 2018-01-24 NOTE — Interval H&P Note (Signed)
History and Physical Interval Note:  01/24/2018 2:33 PM  Virginia Burns  has presented today for surgery, with the diagnosis of left distal humerus fracture  The various methods of treatment have been discussed with the patient and family. After consideration of risks, benefits and other options for treatment, the patient has consented to  Procedure(s): OPEN REDUCTION INTERNAL FIXATION (ORIF) DISTAL HUMERUS  ELBOW/OLECRANON OSTEOTOMY FRACTURE (Left) as a surgical intervention .  The patient's history has been reviewed, patient examined, no change in status, stable for surgery.  I have reviewed the patient's chart and labs.  Questions were answered to the patient's satisfaction.     [name]

## 2018-01-24 NOTE — Anesthesia Preprocedure Evaluation (Addendum)
Anesthesia Evaluation  Patient identified by MRN, date of birth, ID band Patient awake    Reviewed: Allergy & Precautions, NPO status , Patient's Chart, lab work & pertinent test results  History of Anesthesia Complications Negative for: history of anesthetic complications  Airway Mallampati: II  TM Distance: >3 FB Neck ROM: Full    Dental  (+) Dental Advisory Given, Edentulous Upper   Pulmonary asthma , sleep apnea and Oxygen sleep apnea , COPD,  COPD inhaler, former smoker,    breath sounds clear to auscultation       Cardiovascular hypertension, Pt. on medications  Rhythm:Regular Rate:Normal     Neuro/Psych negative neurological ROS  negative psych ROS   GI/Hepatic GERD  Medicated and Controlled,(+)     substance abuse  alcohol use,  Gastroparesis    Endo/Other  negative endocrine ROS  Renal/GU negative Renal ROS  negative genitourinary   Musculoskeletal negative musculoskeletal ROS (+)   Abdominal   Peds  Hematology negative hematology ROS (+)   Anesthesia Other Findings   Reproductive/Obstetrics                            Anesthesia Physical Anesthesia Plan  ASA: III  Anesthesia Plan: General   Post-op Pain Management:  Regional for Post-op pain   Induction: Intravenous  PONV Risk Score and Plan: 3 and Treatment may vary due to age or medical condition, Ondansetron and Dexamethasone  Airway Management Planned: Oral ETT  Additional Equipment: None  Intra-op Plan:   Post-operative Plan: Extubation in OR  Informed Consent: I have reviewed the patients History and Physical, chart, labs and discussed the procedure including the risks, benefits and alternatives for the proposed anesthesia with the patient or authorized representative who has indicated his/her understanding and acceptance.   Dental advisory given  Plan Discussed with: CRNA and  Anesthesiologist  Anesthesia Plan Comments:        Anesthesia Quick Evaluation

## 2018-01-24 NOTE — Anesthesia Procedure Notes (Signed)
Anesthesia Regional Block: Supraclavicular block   Pre-Anesthetic Checklist: ,, timeout performed, Correct Patient, Correct Site, Correct Laterality, Correct Procedure, Correct Position, site marked, Risks and benefits discussed,  Surgical consent,  Pre-op evaluation,  At surgeon's request and post-op pain management  Laterality: Left  Prep: chloraprep       Needles:  Injection technique: Single-shot  Needle Type: Echogenic Needle     Needle Length: 9cm  Needle Gauge: 21     Additional Needles:   Narrative:  Start time: 01/24/2018 1:28 PM End time: 01/24/2018 1:32 PM Injection made incrementally with aspirations every 5 mL.  Performed by: Personally  Anesthesiologist: Audry Pili, MD  Additional Notes: No pain on injection. No increased resistance to injection. Injection made in 5cc increments. Good needle visualization. Patient tolerated the procedure well.

## 2018-01-24 NOTE — Transfer of Care (Signed)
Immediate Anesthesia Transfer of Care Note  Patient: Virginia Burns  Procedure(s) Performed: OPEN REDUCTION INTERNAL FIXATION (ORIF) DISTAL HUMERUS  ELBOW/OLECRANON OSTEOTOMY FRACTURE (Left Elbow)  Patient Location: PACU  Anesthesia Type:GA combined with regional for post-op pain  Level of Consciousness: awake and patient cooperative  Airway & Oxygen Therapy: Patient Spontanous Breathing  Post-op Assessment: Report given to RN and Post -op Vital signs reviewed and stable  Post vital signs: Reviewed and stable  Last Vitals:  Vitals Value Taken Time  BP 122/86 01/24/2018  5:27 PM  Temp    Pulse 104 01/24/2018  5:28 PM  Resp 19 01/24/2018  5:28 PM  SpO2 95 % 01/24/2018  5:28 PM  Vitals shown include unvalidated device data.  Last Pain:  Vitals:   01/24/18 1241  TempSrc:   PainSc: 0-No pain      Patients Stated Pain Goal: 6 (50/09/38 1829)  Complications: No apparent anesthesia complications

## 2018-01-24 NOTE — Anesthesia Procedure Notes (Addendum)
Procedure Name: Intubation Date/Time: 01/24/2018 2:53 PM Performed by: Lance Coon, CRNA Pre-anesthesia Checklist: Patient identified, Emergency Drugs available, Suction available, Patient being monitored and Timeout performed Patient Re-evaluated:Patient Re-evaluated prior to induction Oxygen Delivery Method: Circle system utilized Preoxygenation: Pre-oxygenation with 100% oxygen Induction Type: IV induction Ventilation: Mask ventilation without difficulty Laryngoscope Size: Miller and 2 Grade View: Grade I Tube type: Oral Tube size: 7.0 mm Number of attempts: 1 Airway Equipment and Method: Stylet Placement Confirmation: ETT inserted through vocal cords under direct vision,  positive ETCO2 and breath sounds checked- equal and bilateral Secured at: 21 cm Tube secured with: Tape Dental Injury: Teeth and Oropharynx as per pre-operative assessment

## 2018-01-24 NOTE — Op Note (Addendum)
Preop diagnosis: Left transcondylar closed distal humerus fracture  Postop diagnosis: Same  Procedure: ORIF left transcondylar distal humerus fracture using olecranon osteotomy.  Surgeon Rodell Perna, MD  Assistant Benjiman Core PA-C, medically necessary and present for the entire procedure.   Anesthesia preoperative block plus general anesthesia.  Tourniquet 102 minutes x 250.  Procedure after induction of preoperative block with general anesthesia being bad lateral axillary roll careful padding positioning the lead.down arm pillow between the legs 1015 drape was applied up to the shoulder and the entire proximal extremity was prepped with DuraPrep extremity sheets and draped sterile tourniquet was used Esmarch sterile skin marker and Betadine Steri-Drape.  Preoperative Ancef was given prophylactically.  Arm is wrapped in Esmarch tourniquet inflated.  Midline incision was made triceps tendon was exposed.  Olecranon was predrilled with the pin checked under C arm overdrilled and then the pin was drilled from the posterior cortex of the ulna directly just touching the joint for an oblique osteotomy.  Oscillating saw was used to cut make the osteotomy almost all the way through and that was completed with large white osteotome.  Ulnar nerve was isolated placed with the vessel loop retractor and just a simple suture.  It was mobilized.  Triceps was dissected brought proximally exposing the intra-articular fracture which was transcondylar but no intercondylar extension.  Fracture was reduced with some difficulty K wires were placed medial lateral similar depending child supracondylar fracture and then the lateral plate was fashioned first holding with pins checking under C arm and then filling screws with initially nonlocking and then some locking screws bicortical screws.  Next the medial plate was placed position checked and screws were used to fill holes.  Along nonlocking screw was placed that gave a good  bite across the opposite condyle.  Screws were checked intermittently and make sure none of them violated the trochlear fossa or the olecranon fossa.  There is full flexion-extension of the elbow.  Ulnar nerve was protected and was allowed to sit back in his normal ulnar groove at the end of the case without transposition.  Triceps was repaired medial and lateral and figure-of-eight wire was placed with 55mm x 8 mm partially-threaded cannulated lag screw with washer and 18-gauge figure-of-eight wire was drilled to the cortex placed in a figure 8 tightened down with a square knot final spot pictures were taken.  Irrigation tourniquet deflation 2-0 subcutaneous tissue skin staple closure and postop long-arm splint.  Patient tolerated procedure well transferred to recovery room good pulses good capillary refill.

## 2018-01-24 NOTE — Anesthesia Postprocedure Evaluation (Signed)
Anesthesia Post Note  Patient: SHIAN GOODNOW  Procedure(s) Performed: OPEN REDUCTION INTERNAL FIXATION (ORIF) DISTAL HUMERUS  ELBOW/OLECRANON OSTEOTOMY FRACTURE (Left Elbow)     Patient location during evaluation: PACU Anesthesia Type: General Level of consciousness: awake and alert Pain management: pain level controlled Vital Signs Assessment: post-procedure vital signs reviewed and stable Respiratory status: spontaneous breathing, nonlabored ventilation and respiratory function stable Cardiovascular status: blood pressure returned to baseline and stable Postop Assessment: no apparent nausea or vomiting Anesthetic complications: no    Last Vitals:  Vitals:   01/24/18 1800 01/24/18 1804  BP:  137/86  Pulse: (!) 107 (!) 107  Resp: 14 (!) 25  Temp:    SpO2: 94% 93%    Last Pain:  Vitals:   01/24/18 1730  TempSrc:   PainSc: 0-No pain                 Lynda Rainwater

## 2018-01-25 ENCOUNTER — Other Ambulatory Visit: Payer: Self-pay

## 2018-01-25 LAB — BASIC METABOLIC PANEL
ANION GAP: 7 (ref 5–15)
BUN: 7 mg/dL — ABNORMAL LOW (ref 8–23)
CO2: 30 mmol/L (ref 22–32)
Calcium: 8.5 mg/dL — ABNORMAL LOW (ref 8.9–10.3)
Chloride: 98 mmol/L (ref 98–111)
Creatinine, Ser: 0.51 mg/dL (ref 0.44–1.00)
GFR calc Af Amer: >60 mL/min (ref >60)
GLUCOSE: 102 mg/dL — AB (ref 70–99)
POTASSIUM: 3.4 mmol/L — AB (ref 3.5–5.1)
Sodium: 135 mmol/L (ref 135–145)

## 2018-01-25 MED ORDER — KETOROLAC TROMETHAMINE 30 MG/ML IJ SOLN
30.0000 mg | Freq: Three times a day (TID) | INTRAMUSCULAR | Status: DC | PRN
  Administered 2018-01-25: 30 mg via INTRAVENOUS
  Filled 2018-01-25: qty 1

## 2018-01-25 MED ORDER — OXYCODONE HCL 5 MG PO TABS
10.0000 mg | ORAL_TABLET | ORAL | Status: DC | PRN
  Administered 2018-01-25 (×2): 15 mg via ORAL
  Administered 2018-01-25: 5 mg via ORAL
  Filled 2018-01-25: qty 3
  Filled 2018-01-25: qty 2
  Filled 2018-01-25 (×2): qty 3

## 2018-01-25 NOTE — Progress Notes (Signed)
   Subjective: 1 Day Post-Op Procedure(s) (LRB): OPEN REDUCTION INTERNAL FIXATION (ORIF) DISTAL HUMERUS  ELBOW/OLECRANON OSTEOTOMY FRACTURE (Left) Patient reports pain as severe.    Objective: Vital signs in last 24 hours: Temp:  [98 F (36.7 C)-98.4 F (36.9 C)] 98 F (36.7 C) (09/10 0351) Pulse Rate:  [80-107] 85 (09/10 0351) Resp:  [14-25] 17 (09/10 0351) BP: (122-160)/(50-86) 138/74 (09/10 0351) SpO2:  [93 %-99 %] 99 % (09/10 0351) Weight:  [84.8 kg] 84.8 kg (09/09 1856)  Intake/Output from previous day: 09/09 0701 - 09/10 0700 In: 2040 [P.O.:840; I.V.:1200] Out: 160 [Urine:110; Blood:50] Intake/Output this shift: No intake/output data recorded.  Recent Labs    01/22/18 1620  HGB 12.7   Recent Labs    01/22/18 1620  WBC 15.2*  RBC 4.01  HCT 36.9  PLT 435*   Recent Labs    01/22/18 1620 01/25/18 0433  NA 128* 135  K 3.7 3.4*  CL 90* 98  CO2 25 30  BUN 14 7*  CREATININE 0.59 0.51  GLUCOSE 104* 102*  CALCIUM 9.0 8.5*   No results for input(s): LABPT, INR in the last 72 hours.  block wearing off. can feel small finger , can slightl6y flex fingers but cannot extend .has sensation to radila 3 fingers somewhat  Dg Elbow 2 Views Left  Result Date: 01/24/2018 CLINICAL DATA:  Humerus fracture EXAM: LEFT ELBOW - 2 VIEW COMPARISON:  None. FINDINGS: Plates and screws are seen transfixed in the distal humerus. There is gross anatomic alignment of the fracture fragments. There is also a single large screw and a cerclage wire within the proximal ulna. There is gross anatomic alignment of the ulna fracture fragments. IMPRESSION: ORIF humerus and ulna fractures. Electronically Signed   By: [name] M.D.   On: 01/24/2018 17:28    Assessment/Plan: 1 Day Post-Op Procedure(s) (LRB): OPEN REDUCTION INTERNAL FIXATION (ORIF) DISTAL HUMERUS  ELBOW/OLECRANON OSTEOTOMY FRACTURE (Left) Plan:  Ambulation. Once pain controlled and can make it to BR she could be  discharged.  [name] 01/25/2018, 8:15 AM

## 2018-01-25 NOTE — Plan of Care (Signed)
  Problem: Pain Managment: Goal: General experience of comfort will improve Outcome: Progressing   Problem: Safety: Goal: Ability to remain free from injury will improve Outcome: Progressing   

## 2018-01-25 NOTE — Evaluation (Signed)
Physical Therapy Evaluation Patient Details Name: Virginia Burns MRN: [id number] DOB: 11-Oct-1945 Today's Date: 01/25/2018   History of Present Illness  72 y.o. female admitted on 01/24/18 after falling at home on 01/22/18 sustaining a L elbow fx.  Pt presented for ORIF to  distal humerus elbow/olecranon osteotomy fx on 01/24/18. Pt with other significant PMH of falls with fracture (L foot, L wrist), SOB (wears 2 L O2 [place] at night), osteoporosis, HTN, gastroparesis, emphysema of lung, COPD, and asthma.  Clinical Impression  Pt is weak, wobbly, and slow on her feet compared to her baseline report of function (she still works as a Cabin crew in town).  Her son is able to be with her 24/7 through the weekend at home and is planning on putting some more safety devices in place as despite living in the same home (garage apartment), he was unable to hear her yell when she fell.  Pt would benefit from OT consult and home therapy to help ensure that she is getting more sure-footed.  We will try cane with gait next session.   PT to follow acutely for deficits listed below.       Follow Up Recommendations Home health PT;Supervision for mobility/OOB    Equipment Recommendations  None recommended by PT    Recommendations for Other Services OT consult     Precautions / Restrictions Precautions Precautions: Fall Precaution Comments: pt with significant h/o falls with fracture Required Braces or Orthoses: Sling Restrictions Weight Bearing Restrictions: Yes LUE Weight Bearing: Non weight bearing      Mobility  Bed Mobility Overal bed mobility: Needs Assistance Bed Mobility: Supine to Sit     Supine to sit: Min assist;HOB elevated     General bed mobility comments: Min hand held assist to help pt pull up to sitting from mildly elevated HOB.  Per pt report, she gets out of the right side of the bed at home.  I educated her that the right side is my preferred side for her to get out of, so that works out.    Transfers Overall transfer level: Needs assistance Equipment used: 1 person hand held assist Transfers: Sit to/from Stand Sit to Stand: Min assist         General transfer comment: Min assist to help steady pt when getting up for balance.   Ambulation/Gait Ambulation/Gait assistance: Min assist Gait Distance (Feet): 85 Feet Assistive device: IV Pole;1 person hand held assist Gait Pattern/deviations: Step-through pattern;Shuffle;Staggering right;Staggering left     General Gait Details: Pt with mildly staggering gait pattern, she also shuffles quite a bit right now.  She reports she usually walks much faster.         Balance Overall balance assessment: Needs assistance Sitting-balance support: Feet supported;Single extremity supported Sitting balance-Leahy Scale: Good     Standing balance support: Single extremity supported Standing balance-Leahy Scale: Poor                               Pertinent Vitals/Pain Pain Assessment: Faces Faces Pain Scale: Hurts whole lot Pain Location: left arm Pain Descriptors / Indicators: Aching Pain Intervention(s): Limited activity within patient's tolerance;Monitored during session;Repositioned    Home Living Family/patient expects to be discharged to:: Private residence Living Arrangements: Children Available Help at Discharge: Family;Available 24 hours/day(son is off of work for the rest of the week) Type of Home: House Home Access: Stairs to enter Entrance Stairs-Rails: None Entrance Stairs-Number of  Steps: 1 Home Layout: One level Home Equipment: Cane - single point;Grab bars - tub/shower;Shower seat      Prior Function Level of Independence: Independent         Comments: pt still works as a Cabin crew and per pt, walks much faster than she does currently     Hand Dominance   Dominant Hand: Right    Extremity/Trunk Assessment   Upper Extremity Assessment Upper Extremity Assessment: Defer to OT  evaluation    Lower Extremity Assessment Lower Extremity Assessment: Overall WFL for tasks assessed    Cervical / Trunk Assessment Cervical / Trunk Assessment: Normal  Communication   Communication: No difficulties  Cognition Arousal/Alertness: Awake/alert Behavior During Therapy: WFL for tasks assessed/performed Overall Cognitive Status: Within Functional Limits for tasks assessed                                        General Comments General comments (skin integrity, edema, etc.): Pt having difficulty feeling and moving all of her fingers since she fell (new since she fell, not new since surgery).         Assessment/Plan    PT Assessment Patient needs continued PT services  PT Problem List Decreased strength;Decreased range of motion;Decreased activity tolerance;Decreased balance;Decreased mobility;Decreased knowledge of use of DME;Decreased knowledge of precautions;Pain;Impaired sensation       PT Treatment Interventions DME instruction;Gait training;Stair training;Functional mobility training;Therapeutic activities;Therapeutic exercise;Neuromuscular re-education;Balance training;Patient/family education;Modalities    PT Goals (Current goals can be found in the Care Plan section)  Acute Rehab PT Goals Patient Stated Goal: to get back to her independence PT Goal Formulation: With patient Time For Goal Achievement: 02/01/18 Potential to Achieve Goals: Good    Frequency Min 5X/week           AM-PAC PT "6 Clicks" Daily Activity  Outcome Measure Difficulty turning over in bed (including adjusting bedclothes, sheets and blankets)?: Unable Difficulty moving from lying on back to sitting on the side of the bed? : Unable Difficulty sitting down on and standing up from a chair with arms (e.g., wheelchair, bedside commode, etc,.)?: Unable Help needed moving to and from a bed to chair (including a wheelchair)?: A Little Help needed walking in hospital room?:  A Little Help needed climbing 3-5 steps with a railing? : A Little 6 Click Score: 12    End of Session Equipment Utilized During Treatment: Gait belt;Other (comment)(L arm sling) Activity Tolerance: Patient limited by pain Patient left: in bed;with call bell/phone within reach;with family/visitor present Nurse Communication: Patient requests pain meds PT Visit Diagnosis: History of falling (Z91.81);Difficulty in walking, not elsewhere classified (R26.2);Pain Pain - Right/Left: Left Pain - part of body: Arm    Time: 1205-1232 PT Time Calculation (min) (ACUTE ONLY): 27 min   Charges:       Wells Guiles B. Bransyn Adami, PT, DPT  Acute Rehabilitation [phone] pager #[phone] office   PT Evaluation $PT Eval Moderate Complexity: 1 Mod PT Treatments $Gait Training: 8-22 mins        01/25/2018, 1:42 PM

## 2018-01-26 LAB — BASIC METABOLIC PANEL
Anion gap: 7 (ref 5–15)
BUN: 6 mg/dL — ABNORMAL LOW (ref 8–23)
CALCIUM: 8.4 mg/dL — AB (ref 8.9–10.3)
CO2: 30 mmol/L (ref 22–32)
Chloride: 97 mmol/L — ABNORMAL LOW (ref 98–111)
Creatinine, Ser: 0.43 mg/dL — ABNORMAL LOW (ref 0.44–1.00)
GFR calc non Af Amer: >60 mL/min (ref >60)
Glucose, Bld: 89 mg/dL (ref 70–99)
Potassium: 3.3 mmol/L — ABNORMAL LOW (ref 3.5–5.1)
Sodium: 134 mmol/L — ABNORMAL LOW (ref 135–145)

## 2018-01-26 MED ORDER — OXYCODONE-ACETAMINOPHEN 5-325 MG PO TABS
1.0000 | ORAL_TABLET | ORAL | Status: DC | PRN
  Administered 2018-01-26 – 2018-01-27 (×5): 1 via ORAL
  Filled 2018-01-26 (×5): qty 1

## 2018-01-26 MED ORDER — POTASSIUM CHLORIDE CRYS ER 20 MEQ PO TBCR
20.0000 meq | EXTENDED_RELEASE_TABLET | Freq: Two times a day (BID) | ORAL | Status: DC
  Administered 2018-01-26 – 2018-01-27 (×2): 20 meq via ORAL
  Filled 2018-01-26 (×2): qty 1

## 2018-01-26 MED ORDER — MAGNESIUM CITRATE PO SOLN
0.5000 | Freq: Once | ORAL | Status: AC
  Administered 2018-01-26: 0.5 via ORAL
  Administered 2018-01-27: 296 mL via ORAL
  Filled 2018-01-26: qty 296

## 2018-01-26 MED ORDER — OXYCODONE-ACETAMINOPHEN 5-325 MG PO TABS: 1.0000 | ORAL_TABLET | Freq: Four times a day (QID) | ORAL | 0 refills | Status: DC | PRN

## 2018-01-26 MED ORDER — OXYCODONE-ACETAMINOPHEN 5-325 MG PO TABS: 2.0000 | ORAL_TABLET | ORAL | 0 refills | Status: DC | PRN

## 2018-01-26 MED ORDER — BISACODYL 10 MG RE SUPP
10.0000 mg | Freq: Once | RECTAL | Status: AC
  Administered 2018-01-26 – 2018-01-27 (×2): 10 mg via RECTAL
  Filled 2018-01-26: qty 1

## 2018-01-26 NOTE — Plan of Care (Signed)

## 2018-01-26 NOTE — Progress Notes (Signed)
OT Cancellation Note  Patient Details Name: Virginia Burns MRN: [id number] DOB: 10-05-1945   Cancelled Treatment:    Reason Eval/Treat Not Completed: Patient declined, no reason specified. Recently completed PT session and declined session. Max encouragement and Educated on importance of therapy and difference between OT and PT, Pt continued to decline.  OT will attempt tomorrow.  [place] 01/26/2018, 5:02 PM  Hulda Humphrey OTR/L Acute Rehabilitation Services Pager: [phone] Office: [phone]

## 2018-01-26 NOTE — Progress Notes (Signed)
Subjective: Doing well.  Pain better controlled.  No BM since last Friday.  Denies N/V.  Some flatus.  Limited ambulation due to feeling a little "wobbly".     Objective: Vital signs in last 24 hours: Temp:  [97.7 F (36.5 C)-98 F (36.7 C)] 97.9 F (36.6 C) (09/11 0500) Pulse Rate:  [73-85] 73 (09/11 0500) Resp:  [16-18] 17 (09/11 0500) BP: (117-129)/(62-72) 124/62 (09/11 0500) SpO2:  [99 %-100 %] 100 % (09/11 0500)  Intake/Output from previous day: 09/10 0701 - 09/11 0700 In: 600 [P.O.:600] Out: -  Intake/Output this shift: No intake/output data recorded.  No results for input(s): HGB in the last 72 hours. No results for input(s): WBC, RBC, HCT, PLT in the last 72 hours. Recent Labs    01/25/18 0433 01/26/18 0516  NA 135 134*  K 3.4* 3.3*  CL 98 97*  CO2 30 30  BUN 7* 6*  CREATININE 0.51 0.43*  GLUCOSE 102* 89  CALCIUM 8.5* 8.4*   No results for input(s): LABPT, INR in the last 72 hours.  Exam: Very pleasant female, alert and oriented, NAD.  Splint left UE intact.  No active extension/flexion of ring and small fingers.  Decreased sensation ring and small finger.       Assessment/Plan: No BM.  Will d/c dilaudid and oxy IR.  Give mag citrate and dulcolax supp now.  Possible d/c home tomorrow if pain under good control with percocet 5/325, demonstrates safe ambulation and has bowel movement.   Benjiman Core 01/26/2018, 8:43 AM

## 2018-01-26 NOTE — Progress Notes (Signed)
Physical Therapy Treatment Patient Details Name: Virginia Burns MRN: [id number] DOB: 1945-08-15 Today's Date: 01/26/2018    History of Present Illness 72 y.o. female admitted on 01/24/18 after falling at home on 01/22/18 sustaining a L elbow fx.  Pt presented for ORIF to  distal humerus elbow/olecranon osteotomy fx on 01/24/18. Pt with other significant PMH of falls with fracture (L foot, L wrist), SOB (wears 2 L O2 [place] at night), osteoporosis, HTN, gastroparesis, emphysema of lung, COPD, and asthma.    PT Comments    Pt semirecumbent upon entry, awake and oriented, pain well managed at 6/10, Son in room. Able to progress gait training to [facility] use in RUE. Education on importance to remaining frequently mobile at DC as patient's son has been discussing plan of maintaining patient on bed rest p DC. Education provided on importance of OOB at home. Pt progressing well, still quite slow, but sequencing with SPC is improving. Pt remains anxious about falling, and is heavily limited by DOE this session.    Follow Up Recommendations  Home health PT;Supervision for mobility/OOB     Equipment Recommendations  None recommended by PT    Recommendations for Other Services OT consult     Precautions / Restrictions Precautions Precautions: Fall Precaution Comments: pt with significant h/o falls with fracture Required Braces or Orthoses: Sling Restrictions LUE Weight Bearing: Non weight bearing    Mobility  Bed Mobility Overal bed mobility: Needs Assistance Bed Mobility: Supine to Sit     Supine to sit: Supervision     General bed mobility comments: Moves well to Right EOB, additional time/effort required.   Transfers Overall transfer level: Needs assistance Equipment used: Straight cane Transfers: Sit to/from Stand Sit to Stand: Supervision         General transfer comment: Educaiton on RUE holding SPC while using stationary object (bedrail) to push-up and rise, then slowly transition  to [facility] on floor once balance is established.   Ambulation/Gait Ambulation/Gait assistance: Min guard;Supervision Gait Distance (Feet): 350 Feet Assistive device: Straight cane Gait Pattern/deviations: Step-through pattern Gait velocity: 0.60m/s  Gait velocity interpretation: <1.31 ft/sec, indicative of household ambulator General Gait Details: educated on RUE SPC use for 2-point, step-through gait (RUE moving in sync c LLE)    Stairs             Wheelchair Mobility    Modified Rankin (Stroke Patients Only)       Balance Overall balance assessment: Modified Independent;Mild deficits observed, not formally tested;History of Falls         Standing balance support: During functional activity;Single extremity supported Standing balance-Leahy Scale: Good Standing balance comment: no frank LOB with SPC, but still focused on sequencing; confidence remains low and falls anxiety elevated.                             Cognition Arousal/Alertness: Awake/alert Behavior During Therapy: WFL for tasks assessed/performed Overall Cognitive Status: Within Functional Limits for tasks assessed                                        Exercises      General Comments        Pertinent Vitals/Pain Pain Assessment: 0-10 Pain Score: 6  Pain Location: left arm Pain Descriptors / Indicators: Aching Pain Intervention(s): Limited activity within patient's tolerance;Monitored during session;Premedicated before  session    Home Living                      Prior Function            PT Goals (current goals can now be found in the care plan section) Acute Rehab PT Goals Patient Stated Goal: to get back to her independence PT Goal Formulation: With patient Time For Goal Achievement: 02/01/18 Potential to Achieve Goals: Good Progress towards PT goals: Progressing toward goals    Frequency    Min 5X/week      PT Plan Current plan remains  appropriate    Co-evaluation              AM-PAC PT "6 Clicks" Daily Activity  Outcome Measure  Difficulty turning over in bed (including adjusting bedclothes, sheets and blankets)?: Unable Difficulty moving from lying on back to sitting on the side of the bed? : A Lot Difficulty sitting down on and standing up from a chair with arms (e.g., wheelchair, bedside commode, etc,.)?: A Lot Help needed moving to and from a bed to chair (including a wheelchair)?: A Little Help needed walking in hospital room?: A Little Help needed climbing 3-5 steps with a railing? : A Lot 6 Click Score: 13    End of Session Equipment Utilized During Treatment: (LUE sling) Activity Tolerance: Treatment limited secondary to medical complications (Comment)(limited by easy onset DOE (Hx of COPD) ) Patient left: in chair;with family/visitor present;with call bell/phone within reach Nurse Communication: Mobility status PT Visit Diagnosis: History of falling (Z91.81);Difficulty in walking, not elsewhere classified (R26.2);Pain Pain - Right/Left: Left Pain - part of body: Arm     Time: 6010-9323 PT Time Calculation (min) (ACUTE ONLY): 31 min  Charges:  $Gait Training: 8-22 mins $Therapeutic Activity: 8-22 mins                     3:03 PM, 01/26/18 Etta Grandchild, PT, DPT Physical Therapist - [place] [phone] (Pager)  [phone] (Office)      Buccola,Allan C 01/26/2018, 3:01 PM

## 2018-01-27 ENCOUNTER — Inpatient Hospital Stay (HOSPITAL_COMMUNITY): Payer: PPO

## 2018-01-27 LAB — BASIC METABOLIC PANEL
Anion gap: 4 — ABNORMAL LOW (ref 5–15)
BUN: 7 mg/dL — ABNORMAL LOW (ref 8–23)
CALCIUM: 8.4 mg/dL — AB (ref 8.9–10.3)
CO2: 34 mmol/L — AB (ref 22–32)
Chloride: 98 mmol/L (ref 98–111)
Creatinine, Ser: 0.58 mg/dL (ref 0.44–1.00)
GFR calc Af Amer: >60 mL/min (ref >60)
GFR calc non Af Amer: >60 mL/min (ref >60)
Glucose, Bld: 91 mg/dL (ref 70–99)
Potassium: 3.9 mmol/L (ref 3.5–5.1)
Sodium: 136 mmol/L (ref 135–145)

## 2018-01-27 MED ORDER — BISACODYL 10 MG RE SUPP
RECTAL | Status: AC
  Administered 2018-01-27: 10 mg via RECTAL
  Filled 2018-01-27: qty 1

## 2018-01-27 MED ORDER — MAGNESIUM CITRATE PO SOLN
ORAL | Status: AC
  Administered 2018-01-27: 296 mL via ORAL
  Filled 2018-01-27: qty 296

## 2018-01-27 NOTE — Progress Notes (Signed)
Virginia Burns contacted in regards to discharge- the patient has been given mag citrate along with a dulcolax suppository in hopes to have a BM today.  The patient reports " that she want to go ahead with the discharge and she will [facility] at [facility]".

## 2018-01-27 NOTE — Progress Notes (Signed)
Dr Lorin Mercy has completed the discharge reconciliation records.  The patient was  provided with written and verbal discharge instructions.  The patient will be discharged to home with her son via wheel chair.

## 2018-01-27 NOTE — Progress Notes (Signed)
Subjective: Admits exertional dyspnea when up with PT.  This was also documented. Hx of copd and followed by pulmonology.  Did not have a good bowel movement with mag citrate and dulcolax supp.     Objective: Vital signs in last 24 hours: Temp:  [97.9 F (36.6 C)-99.4 F (37.4 C)] 97.9 F (36.6 C) (09/12 0500) Pulse Rate:  [75-93] 79 (09/12 0500) Resp:  [17-18] 17 (09/12 0500) BP: (109-146)/(60-89) 109/89 (09/12 0500) SpO2:  [95 %-100 %] 99 % (09/12 0500)  Intake/Output from previous day: 09/11 0701 - 09/12 0700 In: 720 [P.O.:720] Out: 200 [Urine:200] Intake/Output this shift: No intake/output data recorded.  No results for input(s): HGB in the last 72 hours. No results for input(s): WBC, RBC, HCT, PLT in the last 72 hours. Recent Labs    01/26/18 0516 01/27/18 0353  NA 134* 136  K 3.3* 3.9  CL 97* 98  CO2 30 34*  BUN 6* 7*  CREATININE 0.43* 0.58  GLUCOSE 89 91  CALCIUM 8.4* 8.4*   No results for input(s): LABPT, INR in the last 72 hours.  Exam Alert and oriented.  NAD. Left hand 4th and 5th fingers unchanged from yesterdays exam.  Splint intact.        Assessment/Plan: Will get chest xray for question of increased exertional dyspnea and KUB to r/o ileus this morning.  Possible d/c home this afternoon if unremarkable.    Benjiman Core 01/27/2018, 8:48 AM

## 2018-01-27 NOTE — Progress Notes (Signed)
Pharmacy was contacted in attempt to print AVS for discharge.  The Pharmacy is unable to make the AVS printable at this time and reports that the discharge has not been completed.  The patient is waiting to be discharged .  Virginia Burns [phone] could not be reached again at this time.

## 2018-01-27 NOTE — Care Management Important Message (Signed)
Important Message  Patient Details  Name: Virginia Burns MRN: [id number] Date of Birth: 04-18-1946   Medicare Important Message Given:  Yes    Erenest Rasher, RN 01/27/2018, 1:16 PM

## 2018-01-27 NOTE — Progress Notes (Signed)
Patient ID: Virginia Burns, female   DOB: 12-May-1946, 72 y.o.   MRN: [id number]   Reviewed xrays.   EXAM: PORTABLE CHEST 1 VIEW  COMPARISON:  06/01/2017  FINDINGS: Heart size is normal. There is minimal atherosclerotic calcification of the thoracic aorta. Minimal bibasilar atelectasis or scarring. No focal consolidations, pleural effusions, or pulmonary edema.  IMPRESSION: No evidence for acute cardiopulmonary abnormality.   EXAM: ABDOMEN - 1 VIEW  COMPARISON:  01/22/2018  FINDINGS: The bowel gas pattern is normal. Average stool burden. Surgical clips are present in the RIGHT UPPER QUADRANT of the abdomen. No radio-opaque calculi or other significant radiographic abnormality are seen.  IMPRESSION: Negative.   Will give patient mag citrate 1/2 bottle and another dulcolax supp.  Can discharge home this afternoon.

## 2018-01-27 NOTE — Discharge Instructions (Signed)
Elevate arm as much as possible above heart .  No lifting, pushing, pulling with left arm.   If you have any increased pain you should contact our office immediately.

## 2018-01-27 NOTE — Evaluation (Signed)
Occupational Therapy Evaluation and defer to [facility] Patient Details Name: Virginia Burns MRN: [id number] DOB: Feb 23, 1946 Today's Date: 01/27/2018    History of Present Illness 72 y.o. female admitted on 01/24/18 after falling at home on 01/22/18 sustaining a L elbow fx.  Pt presented for ORIF to  distal humerus elbow/olecranon osteotomy fx on 01/24/18. Pt with other significant PMH of falls with fracture (L foot, L wrist), SOB (wears 2 L O2 [place] at night), osteoporosis, HTN, gastroparesis, emphysema of lung, COPD, and asthma.   Clinical Impression   PTA pt independent in ADL and mobility - drives, works as Cabin crew. Pt is currently mod A for BUE tasks. Used 1 person HHA for in room mobility. Pt educated on basic exercises for digits and shoulder of LUE (limited by pain this session) Pt educated on compensatory strategies for ADL and also for sling management. Son also received all education. Pt is currently ready for dc from acute OT standpoint, but HHOT is essential for home safety evaluation as well as continued monitoring of LUE. OT to defer further info to [facility].    Follow Up Recommendations  Home health OT;Supervision/Assistance - 24 hour    Equipment Recommendations  None recommended by OT(Pt has appropriate DME)    Recommendations for Other Services       Precautions / Restrictions Precautions Precautions: Fall Precaution Comments: pt with significant h/o falls with fracture Required Braces or Orthoses: Sling Restrictions Weight Bearing Restrictions: Yes LUE Weight Bearing: Non weight bearing      Mobility Bed Mobility Overal bed mobility: Needs Assistance Bed Mobility: Supine to Sit     Supine to sit: Min assist     General bed mobility comments: Pt OOB in recliner at beginning and end of session  Transfers Overall transfer level: Needs assistance Equipment used: 1 person hand held assist Transfers: Sit to/from Stand Sit to Stand: Min assist         General transfer  comment: HHA provided for safety and balance    Balance Overall balance assessment: History of Falls;Needs assistance Sitting-balance support: Feet supported;Single extremity supported Sitting balance-Leahy Scale: Good     Standing balance support: During functional activity;Single extremity supported Standing balance-Leahy Scale: Fair                             ADL either performed or assessed with clinical judgement   ADL Overall ADL's : Needs assistance/impaired Eating/Feeding: Minimal assistance   Grooming: Wash/dry hands;Min guard;Standing Grooming Details (indicate cue type and reason): will require assist for BUE tasks like putting toothpaste on toothbrush Upper Body Bathing: Moderate assistance;Sitting   Lower Body Bathing: Moderate assistance;Sitting/lateral leans   Upper Body Dressing : Maximal assistance;Sitting Upper Body Dressing Details (indicate cue type and reason): educated to dress left arm first Lower Body Dressing: Maximal assistance;Sit to/from stand Lower Body Dressing Details (indicate cue type and reason): assist to initiate socks then able to pull up Toilet Transfer: Minimal assistance;Ambulation(1 person HHA)   Toileting- Water quality scientist and Hygiene: Supervision/safety;Sit to/from stand   Tub/ Shower Transfer: Minimal assistance   Functional mobility during ADLs: Minimal assistance(1 person HHA)       Vision         Perception     Praxis      Pertinent Vitals/Pain Pain Assessment: 0-10 Pain Score: 4  Faces Pain Scale: Hurts whole lot Pain Location: left arm - elbow Pain Descriptors / Indicators: Aching Pain Intervention(s): Monitored  during session;Repositioned;Ice applied     Hand Dominance Right   Extremity/Trunk Assessment Upper Extremity Assessment Upper Extremity Assessment: LUE deficits/detail LUE Deficits / Details: Pt cannot feel her 4th and 5th digit currently limited shoulder ROM due to pain, and  decreased ROM at digits as well LUE: Unable to fully assess due to immobilization LUE Sensation: decreased light touch LUE Coordination: decreased gross motor;decreased fine motor   Lower Extremity Assessment Lower Extremity Assessment: Overall WFL for tasks assessed   Cervical / Trunk Assessment Cervical / Trunk Assessment: Normal   Communication Communication Communication: No difficulties   Cognition Arousal/Alertness: Awake/alert Behavior During Therapy: WFL for tasks assessed/performed Overall Cognitive Status: Within Functional Limits for tasks assessed                                     General Comments       Exercises Exercises: Other exercises Other Exercises Other Exercises: digit ROM - PROM and AROM Other Exercises: shoulder PROM/AROM as tolerated   Shoulder Instructions      Home Living Family/patient expects to be discharged to:: Private residence Living Arrangements: Children Available Help at Discharge: Family;Available 24 hours/day Type of Home: House Home Access: Stairs to enter [employer] of Steps: 1 Entrance Stairs-Rails: None Home Layout: One level     Bathroom Shower/Tub: Occupational psychologist: Handicapped height Bathroom Accessibility: Yes How Accessible: Accessible via walker Home Equipment: Cane - single point;Grab bars - tub/shower;Shower seat   Additional Comments: Son has purchased "life alert" option as well as several "panic" buttons that will alarm him in the house should a fall occur. Pt's son is also purchasing a google home kit so that she can ask it to call him/911 should she need help      Prior Functioning/Environment Level of Independence: Independent        Comments: pt still works as a Cabin crew, cooks, Microbiologist, Transport planner Problem List: Decreased strength;Decreased range of motion;Decreased activity tolerance;Impaired balance (sitting and/or standing);Decreased  coordination;Decreased knowledge of precautions;Impaired sensation;Impaired UE functional use;Pain;Increased edema      OT Treatment/Interventions:      OT Goals(Current goals can be found in the care plan section) Acute Rehab OT Goals Patient Stated Goal: to get back to her independence/working OT Goal Formulation: With patient/family Time For Goal Achievement: 02/10/18 Potential to Achieve Goals: Good  OT Frequency:     Barriers to D/C:            Co-evaluation              AM-PAC PT "6 Clicks" Daily Activity     Outcome Measure Help from another person eating meals?: A Little Help from another person taking care of personal grooming?: A Little Help from another person toileting, which includes using toliet, bedpan, or urinal?: A Little Help from another person bathing (including washing, rinsing, drying)?: A Lot Help from another person to put on and taking off regular upper body clothing?: A Lot Help from another person to put on and taking off regular lower body clothing?: A Lot 6 Click Score: 15   End of Session Equipment Utilized During Treatment: Gait belt;Other (comment)(sling) Nurse Communication: Mobility status;Patient requests pain meds;Other (comment)(asking about laxative)  Activity Tolerance: Patient tolerated treatment well Patient left: in chair;with call bell/phone within reach;with family/visitor present  OT Visit Diagnosis: Unsteadiness on feet (R26.81);Other  abnormalities of gait and mobility (R26.89);Pain;Repeated falls (R29.6) Pain - Right/Left: Left Pain - part of body: Arm                Time: 1331-1403 OT Time Calculation (min): 32 min Charges:  OT General Charges $OT Visit: 1 Visit OT Evaluation $OT Eval Moderate Complexity: 1 Mod OT Treatments $Self Care/Home Management : 8-22 mins  Hulda Humphrey OTR/L Acute Rehabilitation Services Pager: [phone] Office: [phone]  Merri Ray Jerzee Jerome 01/27/2018, 2:49 PM

## 2018-01-27 NOTE — Care Management Note (Signed)
Case Management Note  Patient Details  Name: Virginia Burns MRN: [id number] Date of Birth: Feb 13, 1946  Subjective/Objective:  Fall, left elbow fx, ORIF                  Action/Plan: Spoke to pt and son, Shanon Brow at bedside. Pt lives in home with son. He works from home and can assist with pt during the morning. Plans to hire a caregiver for pt when he is at work. She has a cane at home. Offered choice for HH/list provided. Pt requested [facility] for HH. Contacted [facility] with new referral.   Expected Discharge Date:  01/27/18               Expected Discharge Plan:  [place]  In-House Referral:  NA  Discharge planning Services  CM Consult  Post Acute Care Choice:  Home Health Choice offered to:  Patient  DME Arranged:  N/A DME Agency:  NA  HH Arranged:  PT [place] Agency:  [facility]  Status of Service:  Completed, signed off  If discussed at [place] of Stay Meetings, dates discussed:    Additional Comments:  Erenest Rasher, RN 01/27/2018, 1:11 PM

## 2018-01-27 NOTE — Progress Notes (Signed)
Physical Therapy Treatment Patient Details Name: Virginia Burns MRN: [id number] DOB: 1946-04-05 Today's Date: 01/27/2018    History of Present Illness 72 y.o. female admitted on 01/24/18 after falling at home on 01/22/18 sustaining a L elbow fx.  Pt presented for ORIF to  distal humerus elbow/olecranon osteotomy fx on 01/24/18. Pt with other significant PMH of falls with fracture (L foot, L wrist), SOB (wears 2 L O2 [place] at night), osteoporosis, HTN, gastroparesis, emphysema of lung, COPD, and asthma.    PT Comments    Pt performed gait training and functional mobility during session this am.  Pt slow and guarded and remains fearful of falling.  Pt remains appropriate to return home with support from her son.  Plan for stair training next session as she has one stair to enter home.    Pt sling is poorly fitting regarding strap and informed OT.  PTA placed wash cloths around strap to pad for comfort.    Follow Up Recommendations  Home health PT;Supervision for mobility/OOB     Equipment Recommendations  None recommended by PT    Recommendations for Other Services       Precautions / Restrictions Precautions Precautions: Fall Precaution Comments: pt with significant h/o falls with fracture Required Braces or Orthoses: Sling Restrictions Weight Bearing Restrictions: Yes LUE Weight Bearing: Non weight bearing    Mobility  Bed Mobility Overal bed mobility: Needs Assistance Bed Mobility: Supine to Sit     Supine to sit: Min assist     General bed mobility comments: Moves well to Right EOB, additional time/effort required.  Min assistance for trunk elevation but able to advance LEs to edge of bed unassisted.    Transfers Overall transfer level: Needs assistance Equipment used: Straight cane Transfers: Sit to/from Stand Sit to Stand: Supervision         General transfer comment: Pt performed sit to stand from bed with use of rail she is able to maintain static stance and  reach for cane once in standing.    Ambulation/Gait Ambulation/Gait assistance: Min guard;Supervision Gait Distance (Feet): 200 Feet Assistive device: Straight cane Gait Pattern/deviations: Step-through pattern;Trunk flexed     General Gait Details: Cues for upper trunk control, sequencing with SPC.  A few times patient lost sequencing and required cues to stop and reset to regain control of sequencing.     Stairs             Wheelchair Mobility    Modified Rankin (Stroke Patients Only)       Balance     Sitting balance-Leahy Scale: Good       Standing balance-Leahy Scale: Fair                              Cognition Arousal/Alertness: Awake/alert Behavior During Therapy: WFL for tasks assessed/performed Overall Cognitive Status: Within Functional Limits for tasks assessed                                        Exercises      General Comments        Pertinent Vitals/Pain Pain Assessment: 0-10 Pain Score: 4  Pain Location: left arm Pain Descriptors / Indicators: Aching Pain Intervention(s): Monitored during session;Repositioned    Home Living  Prior Function            PT Goals (current goals can now be found in the care plan section) Acute Rehab PT Goals Patient Stated Goal: to get back to her independence Potential to Achieve Goals: Good Progress towards PT goals: Progressing toward goals    Frequency    Min 5X/week      PT Plan Current plan remains appropriate    Co-evaluation              AM-PAC PT "6 Clicks" Daily Activity  Outcome Measure  Difficulty turning over in bed (including adjusting bedclothes, sheets and blankets)?: Unable Difficulty moving from lying on back to sitting on the side of the bed? : Unable Difficulty sitting down on and standing up from a chair with arms (e.g., wheelchair, bedside commode, etc,.)?: Unable Help needed moving to and from a bed  to chair (including a wheelchair)?: A Little Help needed walking in hospital room?: A Little Help needed climbing 3-5 steps with a railing? : A Little 6 Click Score: 12    End of Session Equipment Utilized During Treatment: Gait belt(LUE sling) Activity Tolerance: Patient tolerated treatment well Patient left: in chair;with family/visitor present;with call bell/phone within reach Nurse Communication: Mobility status PT Visit Diagnosis: History of falling (Z91.81);Difficulty in walking, not elsewhere classified (R26.2);Pain Pain - Right/Left: Left Pain - part of body: Arm     Time: 1133-1207 PT Time Calculation (min) (ACUTE ONLY): 34 min  Charges:  $Gait Training: 8-22 mins $Therapeutic Activity: 8-22 mins                     Governor Rooks, PTA Acute Rehabilitation Services Pager [phone] Office [phone]     Virginia Burns 01/27/2018, 12:29 PM

## 2018-01-28 ENCOUNTER — Encounter (HOSPITAL_COMMUNITY): Payer: Self-pay | Admitting: Orthopaedic Surgery

## 2018-01-29 DIAGNOSIS — I700 Atherosclerosis of aorta: Secondary | ICD-10-CM | POA: Diagnosis not present

## 2018-01-29 DIAGNOSIS — Z87891 Personal history of nicotine dependence: Secondary | ICD-10-CM | POA: Diagnosis not present

## 2018-01-29 DIAGNOSIS — K3184 Gastroparesis: Secondary | ICD-10-CM | POA: Diagnosis not present

## 2018-01-29 DIAGNOSIS — J439 Emphysema, unspecified: Secondary | ICD-10-CM | POA: Diagnosis not present

## 2018-01-29 DIAGNOSIS — G4733 Obstructive sleep apnea (adult) (pediatric): Secondary | ICD-10-CM | POA: Diagnosis not present

## 2018-01-29 DIAGNOSIS — S42472D Displaced transcondylar fracture of left humerus, subsequent encounter for fracture with routine healing: Secondary | ICD-10-CM | POA: Diagnosis not present

## 2018-01-29 DIAGNOSIS — K529 Noninfective gastroenteritis and colitis, unspecified: Secondary | ICD-10-CM | POA: Diagnosis not present

## 2018-01-29 DIAGNOSIS — M545 Low back pain: Secondary | ICD-10-CM | POA: Diagnosis not present

## 2018-01-29 DIAGNOSIS — W19XXXD Unspecified fall, subsequent encounter: Secondary | ICD-10-CM | POA: Diagnosis not present

## 2018-01-29 DIAGNOSIS — M810 Age-related osteoporosis without current pathological fracture: Secondary | ICD-10-CM | POA: Diagnosis not present

## 2018-01-29 DIAGNOSIS — I10 Essential (primary) hypertension: Secondary | ICD-10-CM | POA: Diagnosis not present

## 2018-01-29 DIAGNOSIS — Z7951 Long term (current) use of inhaled steroids: Secondary | ICD-10-CM | POA: Diagnosis not present

## 2018-01-29 DIAGNOSIS — K210 Gastro-esophageal reflux disease with esophagitis: Secondary | ICD-10-CM | POA: Diagnosis not present

## 2018-02-01 NOTE — Discharge Summary (Signed)
Patient ID: MEDRITH VEILLON MRN: [id number] DOB/AGE: April 28, 1946 72 y.o.  Admit date: 01/24/2018 Discharge date: 02/01/2018  Admission Diagnoses:  Active Problems:   Humerus distal fracture   Discharge Diagnoses:  Active Problems:   Humerus distal fracture  status post Procedure(s): OPEN REDUCTION INTERNAL FIXATION (ORIF) DISTAL HUMERUS  ELBOW/OLECRANON OSTEOTOMY FRACTURE  Past Medical History:  Diagnosis Date  . Allergic rhinitis   . Asthma   . Complication of anesthesia    [employer]- hard time working - "dinging for days" - Colonoscopy  . COPD (chronic obstructive pulmonary disease) ([place])   . Diverticulosis of colon (without mention of hemorrhage)   . Emphysema of lung ([place])   . Family history of adverse reaction to anesthesia    Mother- hard to awaken and confusion.  . Family history of malignant neoplasm of gastrointestinal tract   . Gastroparesis   . GERD (gastroesophageal reflux disease)   . Hypertension   . On home oxygen therapy    oxygen at night  . Osteoporosis   . Other esophagitis   . Personal history of colonic polyps 10/07/2009   tubular adenoma  . Shortness of breath dyspnea   . Urinary tract infection   . Vitamin B12 deficiency     Surgeries: Procedure(s): OPEN REDUCTION INTERNAL FIXATION (ORIF) DISTAL HUMERUS  ELBOW/OLECRANON OSTEOTOMY FRACTURE on 01/24/2018   Consultants:   Discharged Condition: Improved  Hospital Course: SHEKITA BOYDEN is an 72 y.o. female who was admitted 01/24/2018 for operative treatment of left distal humerus fracture. Patient failed conservative treatments (please see the history and physical for the specifics) and had severe unremitting pain that affects sleep, daily activities and work/hobbies. After pre-op clearance, the patient was taken to the operating room on 01/24/2018 and underwent  Procedure(s): OPEN REDUCTION INTERNAL FIXATION (ORIF) DISTAL HUMERUS  ELBOW/OLECRANON OSTEOTOMY FRACTURE.    Patient was given  perioperative antibiotics:  Anti-infectives (From admission, onward)   Start     Dose/Rate Route Frequency Ordered Stop   01/24/18 2200  ceFAZolin (ANCEF) IVPB 1 g/50 mL premix     1 g 100 mL/hr over 30 Minutes Intravenous Every 8 hours 01/24/18 1900 01/25/18 0652   01/24/18 1315  ceFAZolin (ANCEF) IVPB 2g/100 mL premix     2 g 200 mL/hr over 30 Minutes Intravenous On call to O.R. 01/24/18 1137 01/24/18 1455       Patient was given sequential compression devices and early ambulation to prevent DVT.   Patient benefited maximally from hospital stay and there were no complications. At the time of discharge, the patient was urinating/moving their bowels without difficulty, tolerating a regular diet, pain is controlled with oral pain medications and they have been cleared by PT/OT.   Recent vital signs: No data found.   Recent laboratory studies: No results for input(s): WBC, HGB, HCT, PLT, NA, K, CL, CO2, BUN, CREATININE, GLUCOSE, INR, CALCIUM in the last 72 hours.  Invalid input(s): PT, 2   Discharge Medications:   Allergies as of 01/27/2018      Reactions   Clindamycin/lincomycin Hives   Sulfur Rash      Medication List    STOP taking these medications   AMBULATORY NON FORMULARY MEDICATION   guaiFENesin 600 MG 12 hr tablet Commonly known as:  MUCINEX   SAF-GEL Gel     TAKE these medications   amLODipine 5 MG tablet Commonly known as:  NORVASC Take 5 mg by mouth daily.   budesonide-formoterol 160-4.5 MCG/ACT inhaler Commonly known as:  SYMBICORT Inhale 2 puffs into the lungs 2 (two) times daily.   CARAFATE 1 GM/10ML suspension Generic drug:  sucralfate TAKE 2 TEASPOONFUL (10 MLS) BY MOUTH  4 TIMES DAILY WITH MEALS AND AT BEDTIME What changed:  See the new instructions.   LORazepam 0.5 MG tablet Commonly known as:  ATIVAN Take 0.5 mg by mouth as needed for anxiety.   oxyCODONE-acetaminophen 5-325 MG tablet Commonly known as:  PERCOCET/ROXICET Take 2 tablets by  mouth every 4 (four) hours as needed for severe pain. What changed:  You were already taking a medication with the same name, and this prescription was added. Make sure you understand how and when to take each.   oxyCODONE-acetaminophen 5-325 MG tablet Commonly known as:  PERCOCET/ROXICET Take 1 tablet by mouth every 6 (six) hours as needed for severe pain. What changed:  Another medication with the same name was added. Make sure you understand how and when to take each.   pantoprazole 40 MG tablet Commonly known as:  PROTONIX Take 40 mg by mouth 2 (two) times daily.   polyethylene glycol packet Commonly known as:  MIRALAX / GLYCOLAX Take 17 g by mouth daily as needed for mild constipation.   PROAIR HFA 108 (90 Base) MCG/ACT inhaler Generic drug:  albuterol Inhale 2 puffs into the lungs every 6 (six) hours as needed for wheezing or shortness of breath.   tiotropium 18 MCG inhalation capsule Commonly known as:  SPIRIVA Place 1 capsule (18 mcg total) into inhaler and inhale daily.       Diagnostic Studies: Dg Lumbar Spine Complete  Result Date: 01/22/2018 CLINICAL DATA:  Mid lumbosacral spine pain post fall. EXAM: LUMBAR SPINE - COMPLETE 4+ VIEW COMPARISON:  Body CT 09/15/2017 FINDINGS: There is no evidence of lumbar spine fracture. Alignment is normal. Mild height loss of T12, L1, L3, L4, favored to be degenerative in etiology. Heavy calcific atherosclerotic disease of the aorta. Gas is distension of the abdomen. IMPRESSION: No definite evidence of lumbosacral spine fracture. Mild height loss of T12, L1, L3, L4, favored to be degenerative in etiology. Electronically Signed   By: Fidela Salisbury M.D.   On: 01/22/2018 14:34   Dg Elbow 2 Views Left  Result Date: 01/24/2018 CLINICAL DATA:  Humerus fracture EXAM: LEFT ELBOW - 2 VIEW COMPARISON:  None. FINDINGS: Plates and screws are seen transfixed in the distal humerus. There is gross anatomic alignment of the fracture fragments. There  is also a single large screw and a cerclage wire within the proximal ulna. There is gross anatomic alignment of the ulna fracture fragments. IMPRESSION: ORIF humerus and ulna fractures. Electronically Signed   By: [name] M.D.   On: 01/24/2018 17:28   Dg Elbow 2 Views Left  Result Date: 01/22/2018 CLINICAL DATA:  Left elbow pain following a fall last night. EXAM: LEFT ELBOW - 2 VIEW COMPARISON:  None. FINDINGS: Transcondylar fracture of the distal humerus with 1 shaft width of anterior displacement, 3 cm of proximal displacement and anterior angulation of the distal fragment. Associated posterior soft tissue swelling. IMPRESSION: Markedly displaced and angulated transcondylar fracture of the distal humerus, as described above. Electronically Signed   By: Claudie Revering M.D.   On: 01/22/2018 14:51   Dg Abd 1 View  Result Date: 01/27/2018 CLINICAL DATA:  Pt states her chest feels fine, she is a little sob, but she states that is her normal, hx copd, , htn, , for her abd, she has been constipated since her ORIF  elbow, feels she needs to have a Bm but cannot, no abd pains EXAM: ABDOMEN - 1 VIEW COMPARISON:  01/22/2018 FINDINGS: The bowel gas pattern is normal. Average stool burden. Surgical clips are present in the RIGHT UPPER QUADRANT of the abdomen. No radio-opaque calculi or other significant radiographic abnormality are seen. IMPRESSION: Negative. Electronically Signed   By: Nolon Nations M.D.   On: 01/27/2018 09:55   Ct Elbow Left Wo Contrast  Result Date: 01/22/2018 CLINICAL DATA:  Distal humeral fracture. EXAM: CT OF THE UPPER LEFT EXTREMITY WITHOUT CONTRAST TECHNIQUE: Multidetector CT imaging of the upper left extremity was performed according to the standard protocol. COMPARISON:  None. FINDINGS: Extensive motion artifact. Poor signal to noise ratio. Difficulty obtaining adequate reconstruction planes. Bones/Joint/Cartilage Displaced supracondylar fracture of the distal humerus with the distal  fragment displaced about 1.5 cm anterior and overlapped by about 2.5 cm. There is resulting apex posterior angulation. The fractured distal metaphyseal margin of the humeral shaft partially articulates with the anterior-distal margin of the ulnar trochlea. Irregularities in the radius and proximal ulna are thought to be due to motion artifact rather than a true fracture. Ligaments Suboptimally assessed by CT. Muscles and Tendons Tendon such as the biceps are difficult to assess due to positioning difficulties. Soft tissues Notable surrounding soft tissue swelling. IMPRESSION: 1. Displaced supracondylar fracture, with the distal fragment displaced about 1.5 cm anteriorly and overlapped by about 2.5 cm. No definite additional fracture although there is extensive motion artifact which partially obscures the radius and ulna, leading to a go stated and double contour appearance markedly reducing sensitivity for subtle fractures of the radius and ulna. Electronically Signed   By: Van Clines M.D.   On: 01/22/2018 18:04   Dg Chest Port 1 View  Result Date: 01/27/2018 CLINICAL DATA:  Pt states her chest feels fine, she is a little sob, but she states that is her normal, hx copd, , htn, , for her abd, she has been constipated since her ORIF elbow,9/9,, feels she needs to have a BM but cannot, no abd pains EXAM: PORTABLE CHEST 1 VIEW COMPARISON:  06/01/2017 FINDINGS: Heart size is normal. There is minimal atherosclerotic calcification of the thoracic aorta. Minimal bibasilar atelectasis or scarring. No focal consolidations, pleural effusions, or pulmonary edema. IMPRESSION: No evidence for acute cardiopulmonary abnormality. Electronically Signed   By: Nolon Nations M.D.   On: 01/27/2018 09:54      Follow-up Information    [name], MD. Schedule an appointment as soon as possible for a visit today.   Specialty:  Orthopedic Surgery Why:  need return office visit with Dr Lorin Mercy 2 weeks postop.  Contact  information: [place] [address] [phone]        [facility] Follow up.   Specialty:  [place] Why:  [place] will call to arrange initial visit Contact information: [address] [place] [address] [phone]           Discharge Plan:  discharge to home  Disposition:     Signed: Benjiman Core 02/01/2018, 9:27 AM

## 2018-02-02 ENCOUNTER — Telehealth (INDEPENDENT_AMBULATORY_CARE_PROVIDER_SITE_OTHER): Payer: Self-pay | Admitting: Orthopaedic Surgery

## 2018-02-02 DIAGNOSIS — J449 Chronic obstructive pulmonary disease, unspecified: Secondary | ICD-10-CM | POA: Diagnosis not present

## 2018-02-02 DIAGNOSIS — R0902 Hypoxemia: Secondary | ICD-10-CM | POA: Diagnosis not present

## 2018-02-02 DIAGNOSIS — S92302A Fracture of unspecified metatarsal bone(s), left foot, initial encounter for closed fracture: Secondary | ICD-10-CM | POA: Diagnosis not present

## 2018-02-02 NOTE — Telephone Encounter (Signed)
Wells Guiles with [facility] is requesting physical therapy verbal orders for this patient: 1 week 2 2 week 2  CB # [phone]

## 2018-02-02 NOTE — Telephone Encounter (Signed)
Ok for orders? 

## 2018-02-03 NOTE — Telephone Encounter (Signed)
I left voicemail for Wells Guiles advising.

## 2018-02-03 NOTE — Telephone Encounter (Signed)
Ok thanks 

## 2018-02-04 ENCOUNTER — Ambulatory Visit (INDEPENDENT_AMBULATORY_CARE_PROVIDER_SITE_OTHER): Payer: PPO

## 2018-02-04 ENCOUNTER — Encounter (INDEPENDENT_AMBULATORY_CARE_PROVIDER_SITE_OTHER): Payer: Self-pay | Admitting: Orthopaedic Surgery

## 2018-02-04 ENCOUNTER — Ambulatory Visit (INDEPENDENT_AMBULATORY_CARE_PROVIDER_SITE_OTHER): Payer: PPO | Admitting: Orthopaedic Surgery

## 2018-02-04 VITALS — BP 141/74 | HR 99 | Ht 65.5 in | Wt 170.0 lb

## 2018-02-04 DIAGNOSIS — S42402A Unspecified fracture of lower end of left humerus, initial encounter for closed fracture: Secondary | ICD-10-CM

## 2018-02-04 MED ORDER — OXYCODONE-ACETAMINOPHEN 5-325 MG PO TABS: 1.0000 | ORAL_TABLET | Freq: Four times a day (QID) | ORAL | 0 refills | Status: DC | PRN

## 2018-02-04 NOTE — Progress Notes (Signed)
Post-Op Visit Note   Patient: Virginia Burns           Date of Birth: 1946-01-23           MRN: [id number] Visit Date: 02/04/2018 PCP: Berkley Harvey, NP   Assessment & Plan: Post-ORIF left distal humerus transcondylar fracture with olecranon osteotomy. Ulnar nerve has weak motor and some sensory decrease.  Posterior splint made that she can unwrap to work on gentle elbow range of motion and then rewrap the posterior splint.  Recheck 1 week.  Chief Complaint: Follow-up ORIF distal humerus fracture. Chief Complaint  Patient presents with  . Left Elbow - Routine Post Op    01/24/18   ORIF Left Distal Humerus Elbow/olecranon Osteotomy Fracture   Visit Diagnoses:  1. Closed fracture of distal end of left humerus, unspecified fracture morphology, initial encounter     Plan: Today patient was put back into a posterior splint.  We will follow-up next week for possible staples out.  We will also possibly put patient in a cast at that point.  Follow-Up Instructions: Return in about 1 week (around 02/11/2018) for Possible staple removal.   Orders:  Orders Placed This Encounter  Procedures  . XR Elbow 2 Views Left   Meds ordered this encounter  Medications  . oxyCODONE-acetaminophen (PERCOCET) 5-325 MG tablet    Sig: Take 1 tablet by mouth every 6 (six) hours as needed for severe pain.    Dispense:  40 tablet    Refill:  0    Imaging: Xr Elbow 2 Views Left  Result Date: 02/04/2018 2 view x-rays left elbow shows previous olecranon osteotomy ORIF medial lateral plate fixation transcondylar fracture in satisfactory position and alignment. Impression post ORIF distal humerus with olecranon osteotomy.   PMFS History: Patient Active Problem List   Diagnosis Date Noted  . Humerus distal fracture 01/24/2018  . Chronic respiratory failure ([place]) 03/04/2015  . Hx of colonic polyps   . Benign neoplasm of ascending colon   . Benign neoplasm of descending colon   . OSA (obstructive sleep  apnea) 09/18/2013  . History of tobacco abuse 09/18/2013  . Benign essential tremor 05/22/2013  . COPD (chronic obstructive pulmonary disease) ([place]) 02/10/2013  . EROSIVE ESOPHAGITIS 10/12/2007  . HYPERTENSION 07/19/2007   Past Medical History:  Diagnosis Date  . Allergic rhinitis   . Asthma   . Complication of anesthesia    Versed- hard time working - "dinging for days" - Colonoscopy  . COPD (chronic obstructive pulmonary disease) ([place])   . Diverticulosis of colon (without mention of hemorrhage)   . Emphysema of lung ([place])   . Family history of adverse reaction to anesthesia    Mother- hard to awaken and confusion.  . Family history of malignant neoplasm of gastrointestinal tract   . Gastroparesis   . GERD (gastroesophageal reflux disease)   . Hypertension   . On home oxygen therapy    oxygen at night  . Osteoporosis   . Other esophagitis   . Personal history of colonic polyps 10/07/2009   tubular adenoma  . Shortness of breath dyspnea   . Urinary tract infection   . Vitamin B12 deficiency     Family History  Problem Relation Age of Onset  . COPD Mother        Deceased, 23  . Tremor Mother   . Colon cancer Cousin   . Diabetes Brother   . Colitis Son   . Tremor Maternal Aunt   .  Other Father        Deceased, 65    Past Surgical History:  Procedure Laterality Date  . ABDOMINAL HYSTERECTOMY    . ABDOMINAL HYSTERECTOMY    . CATARACT EXTRACTION Right   . CHOLECYSTECTOMY    . COLONOSCOPY WITH PROPOFOL N/A 11/21/2014   Procedure: COLONOSCOPY WITH PROPOFOL;  Surgeon: Irene Shipper, MD;  Location: [facility];  Service: Endoscopy;  Laterality: N/A;  . FRACTURE SURGERY    . [place]  . LIGAMENT REPAIR Left 04/18/2013   Procedure: Triangular Fibrocartilage complex open repair;  Surgeon: Jolyn Nap, MD;  Location: [facility];  Service: Orthopedics;  Laterality: Left;  . OOPHORECTOMY    . OPEN REDUCTION INTERNAL FIXATION (ORIF) DISTAL  RADIAL FRACTURE Left 04/18/2013   Procedure: LEFT OPEN REDUCTION INTERNAL FIXATION (ORIF) DISTAL RADIAL FRACTURE;  Surgeon: Jolyn Nap, MD;  Location: [facility];  Service: Orthopedics;  Laterality: Left;  . ORIF ELBOW FRACTURE Left 01/24/2018   Procedure: OPEN REDUCTION INTERNAL FIXATION (ORIF) DISTAL HUMERUS  ELBOW/OLECRANON OSTEOTOMY FRACTURE;  Surgeon: [name], MD;  Location: [facility];  Service: Orthopedics;  Laterality: Left;   Social History   Occupational History  . Occupation: Architectural technologist: [employer]  Tobacco Use  . Smoking status: Former Smoker    Packs/day: 1.00    Years: 25.00    Pack years: 25.00    Types: Cigarettes    Last attempt to quit: 05/18/2009    Years since quitting: 8.7  . Smokeless tobacco: Never Used  Substance and Sexual Activity  . Alcohol use: Yes    Alcohol/week: 14.0 standard drinks    Types: 14 Glasses of wine per week  . Drug use: No  . Sexual activity: Not on file   Exam Wound looks good.  Staples intact.  No drainage or signs of infection.  The splint padding was stuck to patient's skin pretty good.  On the left forearm she did have possibly a nickel size skin tear.  This was very superficial.  No bleeding; patient continues to have some numbness and tingling in the fourth and fifth finger.  Still no active movement of the fourth and fifth finger.

## 2018-02-07 ENCOUNTER — Telehealth (INDEPENDENT_AMBULATORY_CARE_PROVIDER_SITE_OTHER): Payer: Self-pay | Admitting: Orthopaedic Surgery

## 2018-02-07 NOTE — Telephone Encounter (Signed)
I left voicemail for patient asking for return call.

## 2018-02-07 NOTE — Telephone Encounter (Signed)
Patient left a voicemail requesting a return call from you, did not leave any details. Patients # [phone]

## 2018-02-08 NOTE — Telephone Encounter (Signed)
I called patient and discussed. She is supposed to remove the ace wrap and splint and work on slight elbow ROM.  She is then to put the splint back and rewrap the ace wrap. I did instruct patient not to remove all of the cast padding under the splint, and to only remove what was on top of it. Patient expressed understanding and will call back if further questions.

## 2018-02-08 NOTE — Telephone Encounter (Signed)
Patient called back will stay near the phone

## 2018-02-11 ENCOUNTER — Encounter (INDEPENDENT_AMBULATORY_CARE_PROVIDER_SITE_OTHER): Payer: Self-pay | Admitting: Orthopaedic Surgery

## 2018-02-11 ENCOUNTER — Ambulatory Visit (INDEPENDENT_AMBULATORY_CARE_PROVIDER_SITE_OTHER): Payer: PPO | Admitting: Orthopaedic Surgery

## 2018-02-11 VITALS — BP 135/82 | HR 98 | Ht 65.5 in | Wt 170.0 lb

## 2018-02-11 DIAGNOSIS — S42402D Unspecified fracture of lower end of left humerus, subsequent encounter for fracture with routine healing: Secondary | ICD-10-CM

## 2018-02-11 NOTE — Progress Notes (Signed)
Office Visit Note   Patient: Virginia Burns           Date of Birth: 11-Jan-1946           MRN: [id number] Visit Date: 02/11/2018              Requested by: Berkley Harvey, NP [place], [place] [id number] PCP: Berkley Harvey, NP   Assessment & Plan: Visit Diagnoses:  1. Closed fracture of distal end of left humerus with routine healing, unspecified fracture morphology, subsequent encounter     Plan: Staples removed DC splint she is extremely thin skin she can remove her arm out of the sling to work on gentle elbow flexion extension as well as supination pronation.  Recheck 2 weeks.  Hydrocodone 20 tablets prescribed for pain she can stop the Percocet.  Follow-Up Instructions: Return in about 2 weeks (around 02/25/2018).   Orders:  No orders of the defined types were placed in this encounter.  No orders of the defined types were placed in this encounter.     Procedures: No procedures performed   Clinical Data: No additional findings.   Subjective: Chief Complaint  Patient presents with  . Left Elbow - Follow-up    01/24/18  ORIF Left Distal Humerus Elbow/Olecranon Osteotomy Fracture    HPI follow-up transcondylar fracture humerus.  Medial lateral plating olecranon osteotomy.  No new x-rays today.  She has thin skin and RN added some DuoDERM adjacent to the incision where the splint was rubbing.  Staples are harvested today return 2 weeks.  Review of Systems unchanged.   Objective: Vital Signs: BP 135/82   Pulse 98   Ht 5' 5.5" (1.664 m)   Wt 170 lb (77.1 kg)   BMI 27.86 kg/m   Physical Exam no change.  Ortho Exam she still has some ulnar nerve sensory deficit and ulnar nerve weakness.  This is been persistent since her fracture.  Specialty Comments:  No specialty comments available.  Imaging: No results found.   PMFS History: Patient Active Problem List   Diagnosis Date Noted  . Humerus distal fracture 01/24/2018  . Chronic  respiratory failure ([place]) 03/04/2015  . Hx of colonic polyps   . Benign neoplasm of ascending colon   . Benign neoplasm of descending colon   . OSA (obstructive sleep apnea) 09/18/2013  . History of tobacco abuse 09/18/2013  . Benign essential tremor 05/22/2013  . COPD (chronic obstructive pulmonary disease) ([place]) 02/10/2013  . EROSIVE ESOPHAGITIS 10/12/2007  . HYPERTENSION 07/19/2007   Past Medical History:  Diagnosis Date  . Allergic rhinitis   . Asthma   . Complication of anesthesia    Versed- hard time working - "dinging for days" - Colonoscopy  . COPD (chronic obstructive pulmonary disease) ([place])   . Diverticulosis of colon (without mention of hemorrhage)   . Emphysema of lung ([place])   . Family history of adverse reaction to anesthesia    Mother- hard to awaken and confusion.  . Family history of malignant neoplasm of gastrointestinal tract   . Gastroparesis   . GERD (gastroesophageal reflux disease)   . Hypertension   . On home oxygen therapy    oxygen at night  . Osteoporosis   . Other esophagitis   . Personal history of colonic polyps 10/07/2009   tubular adenoma  . Shortness of breath dyspnea   . Urinary tract infection   . Vitamin B12 deficiency     Family  History  Problem Relation Age of Onset  . COPD Mother        Deceased, 47  . Tremor Mother   . Colon cancer Cousin   . Diabetes Brother   . Colitis Son   . Tremor Maternal Aunt   . Other Father        Deceased, 78    Past Surgical History:  Procedure Laterality Date  . ABDOMINAL HYSTERECTOMY    . ABDOMINAL HYSTERECTOMY    . CATARACT EXTRACTION Right   . CHOLECYSTECTOMY    . COLONOSCOPY WITH PROPOFOL N/A 11/21/2014   Procedure: COLONOSCOPY WITH PROPOFOL;  Surgeon: Irene Shipper, MD;  Location: [facility];  Service: Endoscopy;  Laterality: N/A;  . FRACTURE SURGERY    . [place]  . LIGAMENT REPAIR Left 04/18/2013   Procedure: Triangular Fibrocartilage complex open repair;  Surgeon:  Jolyn Nap, MD;  Location: [facility];  Service: Orthopedics;  Laterality: Left;  . OOPHORECTOMY    . OPEN REDUCTION INTERNAL FIXATION (ORIF) DISTAL RADIAL FRACTURE Left 04/18/2013   Procedure: LEFT OPEN REDUCTION INTERNAL FIXATION (ORIF) DISTAL RADIAL FRACTURE;  Surgeon: Jolyn Nap, MD;  Location: [facility];  Service: Orthopedics;  Laterality: Left;  . ORIF ELBOW FRACTURE Left 01/24/2018   Procedure: OPEN REDUCTION INTERNAL FIXATION (ORIF) DISTAL HUMERUS  ELBOW/OLECRANON OSTEOTOMY FRACTURE;  Surgeon: [name], MD;  Location: [facility];  Service: Orthopedics;  Laterality: Left;   Social History   Occupational History  . 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 Smoking status: Former Smoker    Packs/day: 1.00    Years: 25.00    Pack years: 25.00    Types: Cigarettes    Last attempt to quit: 05/18/2009    Years since quitting: 8.7  . Smokeless tobacco: Never Used  Substance and Sexual Activity  . Alcohol use: Yes    Alcohol/week: 14.0 standard drinks    Types: 14 Glasses of wine per week  . Drug use: No  . Sexual activity: Not on file

## 2018-02-15 ENCOUNTER — Telehealth (INDEPENDENT_AMBULATORY_CARE_PROVIDER_SITE_OTHER): Payer: Self-pay | Admitting: Orthopaedic Surgery

## 2018-02-15 NOTE — Telephone Encounter (Signed)
Ok for orders? 

## 2018-02-15 NOTE — Telephone Encounter (Signed)
Virginia Burns with Home Health request verbal orders for OT, states patient will be discharged from PT doing good. Kelly's call back # [phone]

## 2018-02-15 NOTE — Telephone Encounter (Signed)
Sorry , no thank you. She is doing her own excercises.

## 2018-02-15 NOTE — Telephone Encounter (Signed)
I left voicemail for Virginia Burns advising.  

## 2018-02-17 ENCOUNTER — Telehealth (INDEPENDENT_AMBULATORY_CARE_PROVIDER_SITE_OTHER): Payer: Self-pay | Admitting: Radiology

## 2018-02-17 DIAGNOSIS — M545 Low back pain: Secondary | ICD-10-CM | POA: Diagnosis not present

## 2018-02-17 NOTE — Telephone Encounter (Signed)
Noted. FYI 

## 2018-02-17 NOTE — Telephone Encounter (Signed)
Virginia Burns called and said that they will be d/c patient from [facility].  She does however ask for a nurse visit to be ordered to asses a bed sore, stage 1, and to run a urinalysis and culture.  Patient is c/o strong odor and darkness to urine.  I gave verbal order for this.  HHPT will be d/c as planned though, just FYI.  Virginia Burns or [facility] will call us back to advise further on the bedsore and urinalysis.  Will refer to PCP if needed at that time.

## 2018-02-21 ENCOUNTER — Telehealth (INDEPENDENT_AMBULATORY_CARE_PROVIDER_SITE_OTHER): Payer: Self-pay | Admitting: Orthopaedic Surgery

## 2018-02-21 NOTE — Telephone Encounter (Signed)
[facility]  [phone]  Please leave detailed message if Virginia Burns is not available    Verbal Order  1 time a week for five weeks  [place]  Will continue to check wet wound and surgical would of left elbow   Will cover med teaching and safety teaching and assessment

## 2018-02-21 NOTE — Telephone Encounter (Signed)
Ok thanks 

## 2018-02-21 NOTE — Telephone Encounter (Signed)
OK thanks.call pt and see if she still wants them to come if so, order it if not D/C HHRN. I do not need a nurse to check the wound every week.

## 2018-02-21 NOTE — Telephone Encounter (Signed)
[facility] for order?  Please see previous message in chart in regards to verbal order given by Abigail Butts.

## 2018-02-22 MED ORDER — CIPROFLOXACIN HCL 500 MG PO TABS: 500.0000 mg | ORAL_TABLET | Freq: Two times a day (BID) | ORAL | 0 refills | Status: DC

## 2018-02-22 NOTE — Telephone Encounter (Signed)
Medication sent to pharmacy. Patient and Virginia Burns advised.

## 2018-02-22 NOTE — Telephone Encounter (Signed)
Send in cipro 500mg  one po bid times 5 days. Has UTI thanks [facility]

## 2018-02-22 NOTE — Telephone Encounter (Signed)
Spoke with Virginia Burns she would like a call to clarify the information pertaining to verbal orders

## 2018-02-22 NOTE — Telephone Encounter (Signed)
I called and spoke with patient. She definitely wants them to come out and monitor her as she says the bedsore is getting worse.  She also states that her urine has a foul odor and is very dark.  She has had labwork and spoken with her PCP who did not prescribe an antibiotic but told her to follow with [place] as Dr. Lorin Mercy is her treating provider.  UA results available for Dr. Lorin Mercy to review today and advise. I explained to patient that I would call her once he has reviewed results.    I spoke with Glenard Haring and gave verbal orders.  She questioned if Dr. Lorin Mercy had seen UA results on patient. I advised we received them yesterday afternoon and he will be in clinic to advise this afternoon. Angel asked for return call in regards to treatment.  Please advise.

## 2018-02-25 DIAGNOSIS — M545 Low back pain: Secondary | ICD-10-CM | POA: Diagnosis not present

## 2018-02-25 DIAGNOSIS — Z7951 Long term (current) use of inhaled steroids: Secondary | ICD-10-CM | POA: Diagnosis not present

## 2018-02-25 DIAGNOSIS — W19XXXD Unspecified fall, subsequent encounter: Secondary | ICD-10-CM | POA: Diagnosis not present

## 2018-02-25 DIAGNOSIS — S42472D Displaced transcondylar fracture of left humerus, subsequent encounter for fracture with routine healing: Secondary | ICD-10-CM | POA: Diagnosis not present

## 2018-02-25 DIAGNOSIS — Z87891 Personal history of nicotine dependence: Secondary | ICD-10-CM | POA: Diagnosis not present

## 2018-02-25 DIAGNOSIS — K210 Gastro-esophageal reflux disease with esophagitis: Secondary | ICD-10-CM | POA: Diagnosis not present

## 2018-02-28 ENCOUNTER — Telehealth: Payer: Self-pay | Admitting: Pulmonary Disease

## 2018-02-28 DIAGNOSIS — G4733 Obstructive sleep apnea (adult) (pediatric): Secondary | ICD-10-CM | POA: Diagnosis not present

## 2018-02-28 NOTE — Telephone Encounter (Signed)
Called the 888 number provided and its a wrong number.   Asked Rodena Piety is she received the form and she has not.   Called [facility] and talk with Angelica Chessman and he states he cannot see where it was sent, but left a note for the person so they can review and call us back tomorrow (03/01/2018)  Will leave in triage for follow up Will route to [place] as Juluis Rainier

## 2018-03-01 ENCOUNTER — Encounter (INDEPENDENT_AMBULATORY_CARE_PROVIDER_SITE_OTHER): Payer: Self-pay | Admitting: Orthopaedic Surgery

## 2018-03-01 ENCOUNTER — Ambulatory Visit (INDEPENDENT_AMBULATORY_CARE_PROVIDER_SITE_OTHER): Payer: PPO | Admitting: Orthopaedic Surgery

## 2018-03-01 ENCOUNTER — Ambulatory Visit (INDEPENDENT_AMBULATORY_CARE_PROVIDER_SITE_OTHER): Payer: Self-pay

## 2018-03-01 VITALS — BP 137/81 | HR 102 | Ht 65.5 in | Wt 170.0 lb

## 2018-03-01 DIAGNOSIS — G5632 Lesion of radial nerve, left upper limb: Secondary | ICD-10-CM | POA: Insufficient documentation

## 2018-03-01 DIAGNOSIS — S42402D Unspecified fracture of lower end of left humerus, subsequent encounter for fracture with routine healing: Secondary | ICD-10-CM | POA: Diagnosis not present

## 2018-03-01 HISTORY — DX: Lesion of radial nerve, left upper limb: G56.32

## 2018-03-01 NOTE — Progress Notes (Signed)
Post-Op Visit Note   Patient: Virginia Burns           Date of Birth: 1946-02-05           MRN: [id number] Visit Date: 03/01/2018 PCP: Berkley Harvey, NP   Assessment & Plan: Patient returns post ORIF distal humerus fracture with olecranon osteotomy.  She has inability to extend her fingers and thumb as well as wrist consistent with radial nerve injury.  She had distal humerus plating which was exposed for a posterior approach.  Radial nerve was not exposed plate was below the radial groove.   We will send her for OT for splinting and begin some gentle elbow range of motion exercises.  She has 45 to 80 degrees elbow range of motion currently.  Medially postop patient had some problems with ulnar sensation this is improved in her hand.  She had intact median ulnar sensation before surgery with decreased in the ulnar sensation after surgery for a short period time which is improved.  She has not had finger extension since her fracture and splint will be ordered recheck 4 weeks.  Chief Complaint:  Chief Complaint  Patient presents with  . Left Elbow - Fracture    01/24/18  ORIF Left Distal Humerus Elbow/Olecranon Osteotomy Fracture   Visit Diagnoses:  1. Closed fracture of distal end of left humerus with routine healing, unspecified fracture morphology, subsequent encounter     Plan: Patient has extremely thin skin from long history of smoking and using some cortisone inhalers.  There is strips removed.  She has some eschar over the incision but there is no evidence of cellulitis no infection no drainage.  She can apply soap water and lotion.  She can work on continue work on elbow range of motion currently she has 45 to 80 degrees range of motion of the left elbow.  She does not have wrist and finger extension and had a radial nerve injury at the time of her fracture.  We discussed with her that it is variable time for this to return.  We will fix her up with a OT visit with splint.  They can  work on passive elbow range of motion.  Copies of her x-ray was made for elbow that she can take to her therapist.  I plan to recheck her in 3 to 4 weeks.  Follow-Up Instructions: No follow-ups on file.   Orders:  Orders Placed This Encounter  Procedures  . XR Elbow 2 Views Left   No orders of the defined types were placed in this encounter.   Imaging: No results found.  PMFS History: Patient Active Problem List   Diagnosis Date Noted  . Humerus distal fracture 01/24/2018  . Chronic respiratory failure ([place]) 03/04/2015  . Hx of colonic polyps   . Benign neoplasm of ascending colon   . Benign neoplasm of descending colon   . OSA (obstructive sleep apnea) 09/18/2013  . History of tobacco abuse 09/18/2013  . Benign essential tremor 05/22/2013  . COPD (chronic obstructive pulmonary disease) ([place]) 02/10/2013  . EROSIVE ESOPHAGITIS 10/12/2007  . HYPERTENSION 07/19/2007   Past Medical History:  Diagnosis Date  . Allergic rhinitis   . Asthma   . Complication of anesthesia    Versed- hard time working - "dinging for days" - Colonoscopy  . COPD (chronic obstructive pulmonary disease) ([place])   . Diverticulosis of colon (without mention of hemorrhage)   . Emphysema of lung ([place])   . Family history  of adverse reaction to anesthesia    Mother- hard to awaken and confusion.  . Family history of malignant neoplasm of gastrointestinal tract   . Gastroparesis   . GERD (gastroesophageal reflux disease)   . Hypertension   . On home oxygen therapy    oxygen at night  . Osteoporosis   . Other esophagitis   . Personal history of colonic polyps 10/07/2009   tubular adenoma  . Shortness of breath dyspnea   . Urinary tract infection   . Vitamin B12 deficiency     Family History  Problem Relation Age of Onset  . COPD Mother        Deceased, 80  . Tremor Mother   . Colon cancer Cousin   . Diabetes Brother   . Colitis Son   . Tremor Maternal Aunt   . Other Father        Deceased, 53     Past Surgical History:  Procedure Laterality Date  . ABDOMINAL HYSTERECTOMY    . ABDOMINAL HYSTERECTOMY    . CATARACT EXTRACTION Right   . CHOLECYSTECTOMY    . COLONOSCOPY WITH PROPOFOL N/A 11/21/2014   Procedure: COLONOSCOPY WITH PROPOFOL;  Surgeon: Irene Shipper, MD;  Location: [facility];  Service: Endoscopy;  Laterality: N/A;  . FRACTURE SURGERY    . [place]  . LIGAMENT REPAIR Left 04/18/2013   Procedure: Triangular Fibrocartilage complex open repair;  Surgeon: Jolyn Nap, MD;  Location: [facility];  Service: Orthopedics;  Laterality: Left;  . OOPHORECTOMY    . OPEN REDUCTION INTERNAL FIXATION (ORIF) DISTAL RADIAL FRACTURE Left 04/18/2013   Procedure: LEFT OPEN REDUCTION INTERNAL FIXATION (ORIF) DISTAL RADIAL FRACTURE;  Surgeon: Jolyn Nap, MD;  Location: [facility];  Service: Orthopedics;  Laterality: Left;  . ORIF ELBOW FRACTURE Left 01/24/2018   Procedure: OPEN REDUCTION INTERNAL FIXATION (ORIF) DISTAL HUMERUS  ELBOW/OLECRANON OSTEOTOMY FRACTURE;  Surgeon: [name], MD;  Location: [facility];  Service: Orthopedics;  Laterality: Left;   Social History   Occupational History  . Occupation: Architectural technologist: [employer]  Tobacco Use  . Smoking status: Former Smoker    Packs/day: 1.00    Years: 25.00    Pack years: 25.00    Types: Cigarettes    Last attempt to quit: 05/18/2009    Years since quitting: 8.7  . Smokeless tobacco: Never Used  Substance and Sexual Activity  . Alcohol use: Yes    Alcohol/week: 14.0 standard drinks    Types: 14 Glasses of wine per week  . Drug use: No  . Sexual activity: Not on file

## 2018-03-02 ENCOUNTER — Encounter (INDEPENDENT_AMBULATORY_CARE_PROVIDER_SITE_OTHER): Payer: Self-pay | Admitting: Orthopaedic Surgery

## 2018-03-02 NOTE — Telephone Encounter (Signed)
Called and spoke with Johnathan at [facility] in [place]. Per Molli Barrows they do not need anything at this time. He is not sure why they contacted [place] because them being a third party should not contact us but them meaning [employer]. Informed Roderic Palau we would close this message for now and If they needed any further information from [place] just give [place] a call. Voiced understanding. Nothing further is needed at this time.

## 2018-03-04 DIAGNOSIS — S92302A Fracture of unspecified metatarsal bone(s), left foot, initial encounter for closed fracture: Secondary | ICD-10-CM | POA: Diagnosis not present

## 2018-03-04 DIAGNOSIS — J449 Chronic obstructive pulmonary disease, unspecified: Secondary | ICD-10-CM | POA: Diagnosis not present

## 2018-03-04 DIAGNOSIS — R0902 Hypoxemia: Secondary | ICD-10-CM | POA: Diagnosis not present

## 2018-03-14 DIAGNOSIS — M25622 Stiffness of left elbow, not elsewhere classified: Secondary | ICD-10-CM | POA: Diagnosis not present

## 2018-03-14 DIAGNOSIS — R203 Hyperesthesia: Secondary | ICD-10-CM | POA: Diagnosis not present

## 2018-03-14 DIAGNOSIS — S42412D Displaced simple supracondylar fracture without intercondylar fracture of left humerus, subsequent encounter for fracture with routine healing: Secondary | ICD-10-CM | POA: Diagnosis not present

## 2018-03-14 DIAGNOSIS — M79602 Pain in left arm: Secondary | ICD-10-CM | POA: Diagnosis not present

## 2018-03-14 DIAGNOSIS — S5422XD Injury of radial nerve at forearm level, left arm, subsequent encounter: Secondary | ICD-10-CM | POA: Diagnosis not present

## 2018-03-16 ENCOUNTER — Telehealth (INDEPENDENT_AMBULATORY_CARE_PROVIDER_SITE_OTHER): Payer: Self-pay | Admitting: Radiology

## 2018-03-16 NOTE — Telephone Encounter (Signed)
Ok thanks 

## 2018-03-16 NOTE — Telephone Encounter (Signed)
Arbie Cookey from [facility] called and states you had ordered a PAN splint for patient but she has hand muscles that are tight.  She would like to know if they can put on a radial palsy splint instead so the patient is able to move her fingers?  She was unsure if you did not want patient to move her hand.   She also states that you had checked PT/OT/CHT. She is a PT and CHT so she would like to change the order to PT if that is ok. She can take care of everything that you need.  Please advise.  CB for Arbie Cookey  [phone]

## 2018-03-16 NOTE — Telephone Encounter (Signed)
I left voicemail for Virginia Burns advising. I asked for a return call if she needed anything further from me.

## 2018-03-18 DIAGNOSIS — M25622 Stiffness of left elbow, not elsewhere classified: Secondary | ICD-10-CM | POA: Diagnosis not present

## 2018-03-18 DIAGNOSIS — S42412D Displaced simple supracondylar fracture without intercondylar fracture of left humerus, subsequent encounter for fracture with routine healing: Secondary | ICD-10-CM | POA: Diagnosis not present

## 2018-03-18 DIAGNOSIS — M79602 Pain in left arm: Secondary | ICD-10-CM | POA: Diagnosis not present

## 2018-03-18 DIAGNOSIS — S5422XD Injury of radial nerve at forearm level, left arm, subsequent encounter: Secondary | ICD-10-CM | POA: Diagnosis not present

## 2018-03-18 DIAGNOSIS — R203 Hyperesthesia: Secondary | ICD-10-CM | POA: Diagnosis not present

## 2018-03-21 DIAGNOSIS — S5422XD Injury of radial nerve at forearm level, left arm, subsequent encounter: Secondary | ICD-10-CM | POA: Diagnosis not present

## 2018-03-21 DIAGNOSIS — Z79899 Other long term (current) drug therapy: Secondary | ICD-10-CM | POA: Diagnosis not present

## 2018-03-21 DIAGNOSIS — E559 Vitamin D deficiency, unspecified: Secondary | ICD-10-CM | POA: Diagnosis not present

## 2018-03-21 DIAGNOSIS — S42412D Displaced simple supracondylar fracture without intercondylar fracture of left humerus, subsequent encounter for fracture with routine healing: Secondary | ICD-10-CM | POA: Diagnosis not present

## 2018-03-21 DIAGNOSIS — M8589 Other specified disorders of bone density and structure, multiple sites: Secondary | ICD-10-CM | POA: Diagnosis not present

## 2018-03-21 DIAGNOSIS — Z0000 Encounter for general adult medical examination without abnormal findings: Secondary | ICD-10-CM | POA: Diagnosis not present

## 2018-03-21 DIAGNOSIS — I10 Essential (primary) hypertension: Secondary | ICD-10-CM | POA: Diagnosis not present

## 2018-03-21 DIAGNOSIS — M25622 Stiffness of left elbow, not elsewhere classified: Secondary | ICD-10-CM | POA: Diagnosis not present

## 2018-03-21 DIAGNOSIS — M79602 Pain in left arm: Secondary | ICD-10-CM | POA: Diagnosis not present

## 2018-03-21 DIAGNOSIS — R203 Hyperesthesia: Secondary | ICD-10-CM | POA: Diagnosis not present

## 2018-03-29 ENCOUNTER — Ambulatory Visit (INDEPENDENT_AMBULATORY_CARE_PROVIDER_SITE_OTHER): Payer: PPO | Admitting: Orthopaedic Surgery

## 2018-03-29 ENCOUNTER — Ambulatory Visit (INDEPENDENT_AMBULATORY_CARE_PROVIDER_SITE_OTHER): Payer: PPO

## 2018-03-29 ENCOUNTER — Encounter (INDEPENDENT_AMBULATORY_CARE_PROVIDER_SITE_OTHER): Payer: Self-pay | Admitting: Orthopaedic Surgery

## 2018-03-29 VITALS — BP 146/86 | HR 87 | Ht 65.5 in | Wt 170.0 lb

## 2018-03-29 DIAGNOSIS — S42402D Unspecified fracture of lower end of left humerus, subsequent encounter for fracture with routine healing: Secondary | ICD-10-CM | POA: Diagnosis not present

## 2018-03-29 NOTE — Progress Notes (Signed)
Post-Op Visit Note   Patient: Virginia Burns           Date of Birth: 04-Aug-1945           MRN: [id number] Visit Date: 03/29/2018 PCP: Berkley Harvey, NP   Assessment & Plan: Follow-up distal humerus fracture treated with medial lateral plate fixation olecranon osteotomy.  X-rays show fracture is healed.  She has 2 staples that are underneath the skin she does not want them removed today but will return in 1 month and we can apply some local and remove the 2 staples.  Chief Complaint:  Chief Complaint  Patient presents with  . Left Elbow - Follow-up    01/24/18 ORIF Left Distal Humerus Fracture, Olecranon Osteotomy   Visit Diagnoses:  1. Closed fracture of distal end of left humerus with routine healing, unspecified fracture morphology, subsequent encounter     Plan: Month plan for lidocaine local removal of the 2 subcutaneous staples midportion of the olecranon which are palpable and slightly tender.  She has intact radial sensory nerve but still has the radial motor out but is gotten a flicker of return.  She is noticed improvement with some finger extension.  EPL less than 3 out of 5.  Recheck 1 month and plan for probable staple removal at that time under local.  Discontinue sling.  Progress on elbow range of motion.  Continue radial nerve static splint to maintain finger extension.  Follow-Up Instructions: Return in about 1 month (around 04/28/2018).   Orders:  Orders Placed This Encounter  Procedures  . XR Elbow 2 Views Left   No orders of the defined types were placed in this encounter.   Imaging: Xr Elbow 2 Views Left  Result Date: 03/29/2018 AP lateral left elbow obtained and reviewed.  This shows supracondylar humerus fracture with interval consolidation and healing of the olecranon osteotomy.  2 retained subcutaneous staples are noted overlying the olecranon figure-of-eight wire. Impression: Healed supracondylar humerus fracture and olecranon osteotomy.  2 retained  staples present.   PMFS History: Patient Active Problem List   Diagnosis Date Noted  . 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 Humerus distal fracture 01/24/2018  . Chronic respiratory failure ([place]) 03/04/2015  . Hx of colonic polyps   . Benign neoplasm of ascending colon   . Benign neoplasm of descending colon   . OSA (obstructive sleep apnea) 09/18/2013  . History of tobacco abuse 09/18/2013  . Benign essential tremor 05/22/2013  . COPD (chronic obstructive pulmonary disease) ([place]) 02/10/2013  . EROSIVE ESOPHAGITIS 10/12/2007  . HYPERTENSION 07/19/2007   Past Medical History:  Diagnosis Date  . Allergic rhinitis   . Asthma   . Complication of anesthesia    Versed- hard time working - "dinging for days" - Colonoscopy  . COPD (chronic obstructive pulmonary disease) ([place])   . Diverticulosis of colon (without mention of hemorrhage)   . Emphysema of lung ([place])   . Family history of adverse reaction to anesthesia    Mother- hard to awaken and confusion.  . Family history of malignant neoplasm of gastrointestinal tract   . Gastroparesis   . GERD (gastroesophageal reflux disease)   . Hypertension   . On home oxygen therapy    oxygen at night  . Osteoporosis   . Other esophagitis   . Personal history of colonic polyps 10/07/2009   tubular adenoma  . Shortness of breath dyspnea   . Urinary tract infection   . Vitamin B12 deficiency  Family History  Problem Relation Age of Onset  . COPD Mother        Deceased, 9  . Tremor Mother   . Colon cancer Cousin   . Diabetes Brother   . Colitis Son   . Tremor Maternal Aunt   . Other Father        Deceased, 67    Past Surgical History:  Procedure Laterality Date  . ABDOMINAL HYSTERECTOMY    . ABDOMINAL HYSTERECTOMY    . CATARACT EXTRACTION Right   . CHOLECYSTECTOMY    . COLONOSCOPY WITH PROPOFOL N/A 11/21/2014   Procedure: COLONOSCOPY WITH PROPOFOL;  Surgeon: Irene Shipper, MD;  Location: [place];  Service:  Endoscopy;  Laterality: N/A;  . FRACTURE SURGERY    . [place]  . LIGAMENT REPAIR Left 04/18/2013   Procedure: Triangular Fibrocartilage complex open repair;  Surgeon: Jolyn Nap, MD;  Location: [place];  Service: Orthopedics;  Laterality: Left;  . OOPHORECTOMY    . OPEN REDUCTION INTERNAL FIXATION (ORIF) DISTAL RADIAL FRACTURE Left 04/18/2013   Procedure: LEFT OPEN REDUCTION INTERNAL FIXATION (ORIF) DISTAL RADIAL FRACTURE;  Surgeon: Jolyn Nap, MD;  Location: [place];  Service: Orthopedics;  Laterality: Left;  . ORIF ELBOW FRACTURE Left 01/24/2018   Procedure: OPEN REDUCTION INTERNAL FIXATION (ORIF) DISTAL HUMERUS  ELBOW/OLECRANON OSTEOTOMY FRACTURE;  Surgeon: [name], MD;  Location: [place] [facility];  Service: Orthopedics;  Laterality: Left;   Social History   Occupational History  . Occupation: Architectural technologist: [employer]  Tobacco Use  . Smoking status: Former Smoker    Packs/day: 1.00    Years: 25.00    Pack years: 25.00    Types: Cigarettes    Last attempt to quit: 05/18/2009    Years since quitting: 8.8  . Smokeless tobacco: Never Used  Substance and Sexual Activity  . Alcohol use: Yes    Alcohol/week: 14.0 standard drinks    Types: 14 Glasses of wine per week  . Drug use: No  . Sexual activity: Not on file

## 2018-03-30 DIAGNOSIS — M25622 Stiffness of left elbow, not elsewhere classified: Secondary | ICD-10-CM | POA: Diagnosis not present

## 2018-03-30 DIAGNOSIS — S5422XD Injury of radial nerve at forearm level, left arm, subsequent encounter: Secondary | ICD-10-CM | POA: Diagnosis not present

## 2018-03-30 DIAGNOSIS — R203 Hyperesthesia: Secondary | ICD-10-CM | POA: Diagnosis not present

## 2018-03-30 DIAGNOSIS — S42412D Displaced simple supracondylar fracture without intercondylar fracture of left humerus, subsequent encounter for fracture with routine healing: Secondary | ICD-10-CM | POA: Diagnosis not present

## 2018-03-30 DIAGNOSIS — M79602 Pain in left arm: Secondary | ICD-10-CM | POA: Diagnosis not present

## 2018-03-31 DIAGNOSIS — G4733 Obstructive sleep apnea (adult) (pediatric): Secondary | ICD-10-CM | POA: Diagnosis not present

## 2018-04-01 DIAGNOSIS — S42412D Displaced simple supracondylar fracture without intercondylar fracture of left humerus, subsequent encounter for fracture with routine healing: Secondary | ICD-10-CM | POA: Diagnosis not present

## 2018-04-01 DIAGNOSIS — M79602 Pain in left arm: Secondary | ICD-10-CM | POA: Diagnosis not present

## 2018-04-01 DIAGNOSIS — R203 Hyperesthesia: Secondary | ICD-10-CM | POA: Diagnosis not present

## 2018-04-01 DIAGNOSIS — S5422XD Injury of radial nerve at forearm level, left arm, subsequent encounter: Secondary | ICD-10-CM | POA: Diagnosis not present

## 2018-04-01 DIAGNOSIS — M25622 Stiffness of left elbow, not elsewhere classified: Secondary | ICD-10-CM | POA: Diagnosis not present

## 2018-04-04 DIAGNOSIS — J449 Chronic obstructive pulmonary disease, unspecified: Secondary | ICD-10-CM | POA: Diagnosis not present

## 2018-04-04 DIAGNOSIS — S5422XD Injury of radial nerve at forearm level, left arm, subsequent encounter: Secondary | ICD-10-CM | POA: Diagnosis not present

## 2018-04-04 DIAGNOSIS — R203 Hyperesthesia: Secondary | ICD-10-CM | POA: Diagnosis not present

## 2018-04-04 DIAGNOSIS — R0902 Hypoxemia: Secondary | ICD-10-CM | POA: Diagnosis not present

## 2018-04-04 DIAGNOSIS — M79602 Pain in left arm: Secondary | ICD-10-CM | POA: Diagnosis not present

## 2018-04-04 DIAGNOSIS — S42412D Displaced simple supracondylar fracture without intercondylar fracture of left humerus, subsequent encounter for fracture with routine healing: Secondary | ICD-10-CM | POA: Diagnosis not present

## 2018-04-04 DIAGNOSIS — S92302A Fracture of unspecified metatarsal bone(s), left foot, initial encounter for closed fracture: Secondary | ICD-10-CM | POA: Diagnosis not present

## 2018-04-04 DIAGNOSIS — M25622 Stiffness of left elbow, not elsewhere classified: Secondary | ICD-10-CM | POA: Diagnosis not present

## 2018-04-06 DIAGNOSIS — M79602 Pain in left arm: Secondary | ICD-10-CM | POA: Diagnosis not present

## 2018-04-06 DIAGNOSIS — S5422XD Injury of radial nerve at forearm level, left arm, subsequent encounter: Secondary | ICD-10-CM | POA: Diagnosis not present

## 2018-04-06 DIAGNOSIS — R203 Hyperesthesia: Secondary | ICD-10-CM | POA: Diagnosis not present

## 2018-04-06 DIAGNOSIS — M25622 Stiffness of left elbow, not elsewhere classified: Secondary | ICD-10-CM | POA: Diagnosis not present

## 2018-04-06 DIAGNOSIS — S42412D Displaced simple supracondylar fracture without intercondylar fracture of left humerus, subsequent encounter for fracture with routine healing: Secondary | ICD-10-CM | POA: Diagnosis not present

## 2018-04-11 DIAGNOSIS — M25622 Stiffness of left elbow, not elsewhere classified: Secondary | ICD-10-CM | POA: Diagnosis not present

## 2018-04-11 DIAGNOSIS — S5422XD Injury of radial nerve at forearm level, left arm, subsequent encounter: Secondary | ICD-10-CM | POA: Diagnosis not present

## 2018-04-11 DIAGNOSIS — S42412D Displaced simple supracondylar fracture without intercondylar fracture of left humerus, subsequent encounter for fracture with routine healing: Secondary | ICD-10-CM | POA: Diagnosis not present

## 2018-04-11 DIAGNOSIS — M79602 Pain in left arm: Secondary | ICD-10-CM | POA: Diagnosis not present

## 2018-04-11 DIAGNOSIS — R203 Hyperesthesia: Secondary | ICD-10-CM | POA: Diagnosis not present

## 2018-04-12 DIAGNOSIS — S42412D Displaced simple supracondylar fracture without intercondylar fracture of left humerus, subsequent encounter for fracture with routine healing: Secondary | ICD-10-CM | POA: Diagnosis not present

## 2018-04-12 DIAGNOSIS — M25622 Stiffness of left elbow, not elsewhere classified: Secondary | ICD-10-CM | POA: Diagnosis not present

## 2018-04-12 DIAGNOSIS — R203 Hyperesthesia: Secondary | ICD-10-CM | POA: Diagnosis not present

## 2018-04-12 DIAGNOSIS — M79602 Pain in left arm: Secondary | ICD-10-CM | POA: Diagnosis not present

## 2018-04-12 DIAGNOSIS — S5422XD Injury of radial nerve at forearm level, left arm, subsequent encounter: Secondary | ICD-10-CM | POA: Diagnosis not present

## 2018-04-19 DIAGNOSIS — M25622 Stiffness of left elbow, not elsewhere classified: Secondary | ICD-10-CM | POA: Diagnosis not present

## 2018-04-19 DIAGNOSIS — R203 Hyperesthesia: Secondary | ICD-10-CM | POA: Diagnosis not present

## 2018-04-19 DIAGNOSIS — G5632 Lesion of radial nerve, left upper limb: Secondary | ICD-10-CM | POA: Diagnosis not present

## 2018-04-19 DIAGNOSIS — M79602 Pain in left arm: Secondary | ICD-10-CM | POA: Diagnosis not present

## 2018-04-19 DIAGNOSIS — S42492D Other displaced fracture of lower end of left humerus, subsequent encounter for fracture with routine healing: Secondary | ICD-10-CM | POA: Diagnosis not present

## 2018-04-22 DIAGNOSIS — S42492D Other displaced fracture of lower end of left humerus, subsequent encounter for fracture with routine healing: Secondary | ICD-10-CM | POA: Diagnosis not present

## 2018-04-22 DIAGNOSIS — R203 Hyperesthesia: Secondary | ICD-10-CM | POA: Diagnosis not present

## 2018-04-22 DIAGNOSIS — M79602 Pain in left arm: Secondary | ICD-10-CM | POA: Diagnosis not present

## 2018-04-22 DIAGNOSIS — M25622 Stiffness of left elbow, not elsewhere classified: Secondary | ICD-10-CM | POA: Diagnosis not present

## 2018-04-22 DIAGNOSIS — G5632 Lesion of radial nerve, left upper limb: Secondary | ICD-10-CM | POA: Diagnosis not present

## 2018-04-25 DIAGNOSIS — G5632 Lesion of radial nerve, left upper limb: Secondary | ICD-10-CM | POA: Diagnosis not present

## 2018-04-25 DIAGNOSIS — R203 Hyperesthesia: Secondary | ICD-10-CM | POA: Diagnosis not present

## 2018-04-25 DIAGNOSIS — S42492D Other displaced fracture of lower end of left humerus, subsequent encounter for fracture with routine healing: Secondary | ICD-10-CM | POA: Diagnosis not present

## 2018-04-25 DIAGNOSIS — M25622 Stiffness of left elbow, not elsewhere classified: Secondary | ICD-10-CM | POA: Diagnosis not present

## 2018-04-25 DIAGNOSIS — M79602 Pain in left arm: Secondary | ICD-10-CM | POA: Diagnosis not present

## 2018-04-28 DIAGNOSIS — G5632 Lesion of radial nerve, left upper limb: Secondary | ICD-10-CM | POA: Diagnosis not present

## 2018-04-28 DIAGNOSIS — S42492D Other displaced fracture of lower end of left humerus, subsequent encounter for fracture with routine healing: Secondary | ICD-10-CM | POA: Diagnosis not present

## 2018-04-28 DIAGNOSIS — M79602 Pain in left arm: Secondary | ICD-10-CM | POA: Diagnosis not present

## 2018-04-28 DIAGNOSIS — M25622 Stiffness of left elbow, not elsewhere classified: Secondary | ICD-10-CM | POA: Diagnosis not present

## 2018-04-28 DIAGNOSIS — R203 Hyperesthesia: Secondary | ICD-10-CM | POA: Diagnosis not present

## 2018-04-29 ENCOUNTER — Encounter (INDEPENDENT_AMBULATORY_CARE_PROVIDER_SITE_OTHER): Payer: Self-pay | Admitting: Orthopaedic Surgery

## 2018-04-29 ENCOUNTER — Ambulatory Visit (INDEPENDENT_AMBULATORY_CARE_PROVIDER_SITE_OTHER): Payer: PPO | Admitting: Orthopaedic Surgery

## 2018-04-29 VITALS — BP 151/83 | HR 83 | Ht 65.5 in | Wt 170.0 lb

## 2018-04-29 DIAGNOSIS — S42402D Unspecified fracture of lower end of left humerus, subsequent encounter for fracture with routine healing: Secondary | ICD-10-CM | POA: Diagnosis not present

## 2018-04-30 DIAGNOSIS — G4733 Obstructive sleep apnea (adult) (pediatric): Secondary | ICD-10-CM | POA: Diagnosis not present

## 2018-05-01 NOTE — Progress Notes (Signed)
Office Visit Note   Patient: Virginia Burns           Date of Birth: 07/03/1945           MRN: [id number] Visit Date: 04/29/2018              Requested by: Berkley Harvey, NP [place], [place] [address] PCP: Berkley Harvey, NP   Assessment & Plan: Visit Diagnoses:  1. Closed fracture of distal end of left humerus with routine healing, unspecified fracture morphology, subsequent encounter     Plan: Patient had 2 retained staples underneath the skin which bother her.  She returned skin looks good Betadine prep sterile technique Xylocaine local small stab incision and removal with a hemostat of the 2 skin staples that were underneath the skin.  She is making good progress with physical therapy with her shoulder and has improved elbow range of motion.  She is happy with the surgical result.  She will continue to work on elbow range of motion.  Follow-Up Instructions: No follow-ups on file.   Orders:  No orders of the defined types were placed in this encounter.  No orders of the defined types were placed in this encounter.     Procedures: No procedures performed   Clinical Data: No additional findings.   Subjective: Chief Complaint  Patient presents with  . Left Elbow - Follow-up    01/24/18 ORIF Left Distal Humerus Fracture, Olecranon Osteotomy    HPI returns post olecranon osteotomy for ORIF distal humerus fracture.  Humerus is healed she is doing therapy.  She is here for removal of 2 retained subcutaneous staples for the ulna.  Review of Systems unchanged from elbow surgery date.   Objective: Vital Signs: BP (!) 151/83   Pulse 83   Ht 5' 5.5" (1.664 m)   Wt 170 lb (77.1 kg)   BMI 27.86 kg/m   Physical Exam Constitutional:      Appearance: She is well-developed.  HENT:     Head: Normocephalic.     Right Ear: External ear normal.     Left Ear: External ear normal.  Eyes:     Pupils: Pupils are equal, round, and reactive to light.   Neck:     Thyroid: No thyromegaly.     Trachea: No tracheal deviation.  Cardiovascular:     Rate and Rhythm: Normal rate.  Pulmonary:     Effort: Pulmonary effort is normal.  Abdominal:     Palpations: Abdomen is soft.  Skin:    General: Skin is warm and dry.  Neurological:     Mental Status: She is alert and oriented to person, place, and time.  Psychiatric:        Behavior: Behavior normal.     Ortho Exam 2 staples are palpable subtendinous exactly where the tension band wire is present for the olecranon osteotomy.  No cellulitis.  Elbow range of motion 30-125 degrees.  Happy with her progress.  Supination 30 degrees pronation 80 degrees.  Specialty Comments:  No specialty comments available.  Imaging: No results found.   PMFS History: Patient Active Problem List   Diagnosis Date Noted  . 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 Humerus distal fracture 01/24/2018  . Chronic respiratory failure ([place]) 03/04/2015  . Hx of colonic polyps   . Benign neoplasm of ascending colon   . Benign neoplasm of descending colon   . OSA (obstructive sleep apnea) 09/18/2013  .  History of tobacco abuse 09/18/2013  . Benign essential tremor 05/22/2013  . COPD (chronic obstructive pulmonary disease) ([place]) 02/10/2013  . EROSIVE ESOPHAGITIS 10/12/2007  . HYPERTENSION 07/19/2007   Past Medical History:  Diagnosis Date  . Allergic rhinitis   . Asthma   . Complication of anesthesia    Versed- hard time working - "dinging for days" - Colonoscopy  . COPD (chronic obstructive pulmonary disease) ([place])   . Diverticulosis of colon (without mention of hemorrhage)   . Emphysema of lung ([place])   . Family history of adverse reaction to anesthesia    Mother- hard to awaken and confusion.  . Family history of malignant neoplasm of gastrointestinal tract   . Gastroparesis   . GERD (gastroesophageal reflux disease)   . Hypertension   . On home oxygen therapy    oxygen at night  .  Osteoporosis   . Other esophagitis   . Personal history of colonic polyps 10/07/2009   tubular adenoma  . Shortness of breath dyspnea   . Urinary tract infection   . Vitamin B12 deficiency     Family History  Problem Relation Age of Onset  . COPD Mother        Deceased, 24  . Tremor Mother   . Colon cancer Cousin   . Diabetes Brother   . Colitis Son   . Tremor Maternal Aunt   . Other Father        Deceased, 75    Past Surgical History:  Procedure Laterality Date  . ABDOMINAL HYSTERECTOMY    . ABDOMINAL HYSTERECTOMY    . CATARACT EXTRACTION Right   . CHOLECYSTECTOMY    . COLONOSCOPY WITH PROPOFOL N/A 11/21/2014   Procedure: COLONOSCOPY WITH PROPOFOL;  Surgeon: Irene Shipper, MD;  Location: [place];  Service: Endoscopy;  Laterality: N/A;  . FRACTURE SURGERY    . [place]  . LIGAMENT REPAIR Left 04/18/2013   Procedure: Triangular Fibrocartilage complex open repair;  Surgeon: Jolyn Nap, MD;  Location: [place];  Service: Orthopedics;  Laterality: Left;  . OOPHORECTOMY    . OPEN REDUCTION INTERNAL FIXATION (ORIF) DISTAL RADIAL FRACTURE Left 04/18/2013   Procedure: LEFT OPEN REDUCTION INTERNAL FIXATION (ORIF) DISTAL RADIAL FRACTURE;  Surgeon: Jolyn Nap, MD;  Location: [place];  Service: Orthopedics;  Laterality: Left;  . ORIF ELBOW FRACTURE Left 01/24/2018   Procedure: OPEN REDUCTION INTERNAL FIXATION (ORIF) DISTAL HUMERUS  ELBOW/OLECRANON OSTEOTOMY FRACTURE;  Surgeon: [name], MD;  Location: [facility];  Service: Orthopedics;  Laterality: Left;   Social History   Occupational History  . Occupation: Architectural technologist: [employer]  Tobacco Use  . Smoking status: Former Smoker    Packs/day: 1.00    Years: 25.00    Pack years: 25.00    Types: Cigarettes    Last attempt to quit: 05/18/2009    Years since quitting: 8.9  . Smokeless tobacco: Never Used  Substance and Sexual Activity  . Alcohol use: Yes     Alcohol/week: 14.0 standard drinks    Types: 14 Glasses of wine per week  . Drug use: No  . Sexual activity: Not on file

## 2018-05-02 DIAGNOSIS — R203 Hyperesthesia: Secondary | ICD-10-CM | POA: Diagnosis not present

## 2018-05-02 DIAGNOSIS — M79602 Pain in left arm: Secondary | ICD-10-CM | POA: Diagnosis not present

## 2018-05-02 DIAGNOSIS — M25622 Stiffness of left elbow, not elsewhere classified: Secondary | ICD-10-CM | POA: Diagnosis not present

## 2018-05-02 DIAGNOSIS — S42492D Other displaced fracture of lower end of left humerus, subsequent encounter for fracture with routine healing: Secondary | ICD-10-CM | POA: Diagnosis not present

## 2018-05-02 DIAGNOSIS — G5632 Lesion of radial nerve, left upper limb: Secondary | ICD-10-CM | POA: Diagnosis not present

## 2018-05-04 DIAGNOSIS — S92302A Fracture of unspecified metatarsal bone(s), left foot, initial encounter for closed fracture: Secondary | ICD-10-CM | POA: Diagnosis not present

## 2018-05-04 DIAGNOSIS — J449 Chronic obstructive pulmonary disease, unspecified: Secondary | ICD-10-CM | POA: Diagnosis not present

## 2018-05-04 DIAGNOSIS — R0902 Hypoxemia: Secondary | ICD-10-CM | POA: Diagnosis not present

## 2018-05-06 DIAGNOSIS — M25622 Stiffness of left elbow, not elsewhere classified: Secondary | ICD-10-CM | POA: Diagnosis not present

## 2018-05-06 DIAGNOSIS — S42492D Other displaced fracture of lower end of left humerus, subsequent encounter for fracture with routine healing: Secondary | ICD-10-CM | POA: Diagnosis not present

## 2018-05-06 DIAGNOSIS — M79602 Pain in left arm: Secondary | ICD-10-CM | POA: Diagnosis not present

## 2018-05-06 DIAGNOSIS — R203 Hyperesthesia: Secondary | ICD-10-CM | POA: Diagnosis not present

## 2018-05-06 DIAGNOSIS — G5632 Lesion of radial nerve, left upper limb: Secondary | ICD-10-CM | POA: Diagnosis not present

## 2018-05-13 ENCOUNTER — Telehealth: Payer: Self-pay | Admitting: Pulmonary Disease

## 2018-05-13 ENCOUNTER — Telehealth (INDEPENDENT_AMBULATORY_CARE_PROVIDER_SITE_OTHER): Payer: Self-pay | Admitting: Orthopaedic Surgery

## 2018-05-13 NOTE — Telephone Encounter (Signed)
Ok for letter

## 2018-05-13 NOTE — Telephone Encounter (Signed)
Give her a one month excuse , sorry I cannot do more or longer but it was elbow and not back or lower extremity. Let her know thanks.

## 2018-05-13 NOTE — Telephone Encounter (Signed)
Patient called stating that she has been summoned for jury duty.  She is requesting a letter from Dr. Lorin Mercy stating that she can not attend jury duty due to having surgery.  She would like for you to mail that letter to her.  CB#[phone].  Thank you.

## 2018-05-16 NOTE — Telephone Encounter (Signed)
Letter mailed.  I called, unable to reach patient, will try again.

## 2018-05-17 NOTE — Telephone Encounter (Signed)
Patient returned call, CB is [phone].

## 2018-05-17 NOTE — Telephone Encounter (Signed)
Attempted to call pt but unable to reach her and unable to leave a VM due to no mailbox being set up. Will try to call back later.

## 2018-05-17 NOTE — Telephone Encounter (Signed)
Called and spoke with patient, she stated that she was called for [employer] duty and she is not able to do this due to her COPD. She would like a letter stating that she cannot. Please advise, thank you.

## 2018-05-18 HISTORY — PX: PELVIC FRACTURE SURGERY: SHX119

## 2018-05-20 DIAGNOSIS — S42492D Other displaced fracture of lower end of left humerus, subsequent encounter for fracture with routine healing: Secondary | ICD-10-CM | POA: Diagnosis not present

## 2018-05-20 DIAGNOSIS — G5632 Lesion of radial nerve, left upper limb: Secondary | ICD-10-CM | POA: Diagnosis not present

## 2018-05-20 DIAGNOSIS — M25622 Stiffness of left elbow, not elsewhere classified: Secondary | ICD-10-CM | POA: Diagnosis not present

## 2018-05-20 DIAGNOSIS — M79602 Pain in left arm: Secondary | ICD-10-CM | POA: Diagnosis not present

## 2018-05-20 DIAGNOSIS — R203 Hyperesthesia: Secondary | ICD-10-CM | POA: Diagnosis not present

## 2018-05-23 DIAGNOSIS — G5632 Lesion of radial nerve, left upper limb: Secondary | ICD-10-CM | POA: Diagnosis not present

## 2018-05-23 DIAGNOSIS — S42492D Other displaced fracture of lower end of left humerus, subsequent encounter for fracture with routine healing: Secondary | ICD-10-CM | POA: Diagnosis not present

## 2018-05-23 DIAGNOSIS — M25622 Stiffness of left elbow, not elsewhere classified: Secondary | ICD-10-CM | POA: Diagnosis not present

## 2018-05-23 DIAGNOSIS — R203 Hyperesthesia: Secondary | ICD-10-CM | POA: Diagnosis not present

## 2018-05-23 DIAGNOSIS — M79602 Pain in left arm: Secondary | ICD-10-CM | POA: Diagnosis not present

## 2018-05-24 ENCOUNTER — Encounter: Payer: Self-pay | Admitting: Pulmonary Disease

## 2018-05-24 NOTE — Telephone Encounter (Signed)
I called and spoke with patient. She received the letter, but states that it is not going to work. She is supposed to report to jury duty the end of February. She is still attending therapy and is unable to drive. She asks if we can do a note excusing her for at least 3 months?  She is not going to be able to drive herself there.  I advised I would ask Dr. Lorin Mercy and call her back. Please advise.

## 2018-05-24 NOTE — Telephone Encounter (Signed)
VS please advise on this, thank you.

## 2018-05-24 NOTE — Telephone Encounter (Signed)
Ok thanks 

## 2018-05-24 NOTE — Telephone Encounter (Signed)
Letter has been completed.  Can you please print letter, and I can sign in office tomorrow.

## 2018-05-24 NOTE — Telephone Encounter (Signed)
VS is aware the letter requested by the patient for jury duty He has been out of the office, will f/u with him on this 05/27/2018

## 2018-05-25 DIAGNOSIS — G5632 Lesion of radial nerve, left upper limb: Secondary | ICD-10-CM | POA: Diagnosis not present

## 2018-05-25 NOTE — Telephone Encounter (Signed)
Letter entered into system and mailed to patient. I called patient and advised.

## 2018-05-26 NOTE — Telephone Encounter (Signed)
Letter has been printed Waiting for VS signature in clinic 05/27/2018 Will update once signed.

## 2018-05-26 NOTE — Telephone Encounter (Signed)
Virginia Burns, has this been done?  Will route to [facility] to follow up

## 2018-05-27 NOTE — Telephone Encounter (Signed)
Letter is signed Called and spoke with pt to see if letter needs mailed or p/u by pt Pt prefers to have it mailed to her; placed in mail today Nothing further needed.

## 2018-05-30 DIAGNOSIS — M79602 Pain in left arm: Secondary | ICD-10-CM | POA: Diagnosis not present

## 2018-05-30 DIAGNOSIS — S42492D Other displaced fracture of lower end of left humerus, subsequent encounter for fracture with routine healing: Secondary | ICD-10-CM | POA: Diagnosis not present

## 2018-05-30 DIAGNOSIS — R203 Hyperesthesia: Secondary | ICD-10-CM | POA: Diagnosis not present

## 2018-05-30 DIAGNOSIS — G5632 Lesion of radial nerve, left upper limb: Secondary | ICD-10-CM | POA: Diagnosis not present

## 2018-05-30 DIAGNOSIS — M25622 Stiffness of left elbow, not elsewhere classified: Secondary | ICD-10-CM | POA: Diagnosis not present

## 2018-05-31 DIAGNOSIS — G4733 Obstructive sleep apnea (adult) (pediatric): Secondary | ICD-10-CM | POA: Diagnosis not present

## 2018-06-01 DIAGNOSIS — M25622 Stiffness of left elbow, not elsewhere classified: Secondary | ICD-10-CM | POA: Diagnosis not present

## 2018-06-01 DIAGNOSIS — G5632 Lesion of radial nerve, left upper limb: Secondary | ICD-10-CM | POA: Diagnosis not present

## 2018-06-01 DIAGNOSIS — R203 Hyperesthesia: Secondary | ICD-10-CM | POA: Diagnosis not present

## 2018-06-01 DIAGNOSIS — S42492D Other displaced fracture of lower end of left humerus, subsequent encounter for fracture with routine healing: Secondary | ICD-10-CM | POA: Diagnosis not present

## 2018-06-01 DIAGNOSIS — M79602 Pain in left arm: Secondary | ICD-10-CM | POA: Diagnosis not present

## 2018-06-03 DIAGNOSIS — J441 Chronic obstructive pulmonary disease with (acute) exacerbation: Secondary | ICD-10-CM | POA: Diagnosis not present

## 2018-06-04 DIAGNOSIS — S92302A Fracture of unspecified metatarsal bone(s), left foot, initial encounter for closed fracture: Secondary | ICD-10-CM | POA: Diagnosis not present

## 2018-06-04 DIAGNOSIS — R0902 Hypoxemia: Secondary | ICD-10-CM | POA: Diagnosis not present

## 2018-06-04 DIAGNOSIS — J449 Chronic obstructive pulmonary disease, unspecified: Secondary | ICD-10-CM | POA: Diagnosis not present

## 2018-06-13 DIAGNOSIS — G5632 Lesion of radial nerve, left upper limb: Secondary | ICD-10-CM | POA: Diagnosis not present

## 2018-06-13 DIAGNOSIS — R203 Hyperesthesia: Secondary | ICD-10-CM | POA: Diagnosis not present

## 2018-06-13 DIAGNOSIS — S42492D Other displaced fracture of lower end of left humerus, subsequent encounter for fracture with routine healing: Secondary | ICD-10-CM | POA: Diagnosis not present

## 2018-06-13 DIAGNOSIS — M25622 Stiffness of left elbow, not elsewhere classified: Secondary | ICD-10-CM | POA: Diagnosis not present

## 2018-06-13 DIAGNOSIS — M79602 Pain in left arm: Secondary | ICD-10-CM | POA: Diagnosis not present

## 2018-06-15 DIAGNOSIS — G5632 Lesion of radial nerve, left upper limb: Secondary | ICD-10-CM | POA: Diagnosis not present

## 2018-06-15 DIAGNOSIS — S42492D Other displaced fracture of lower end of left humerus, subsequent encounter for fracture with routine healing: Secondary | ICD-10-CM | POA: Diagnosis not present

## 2018-06-15 DIAGNOSIS — R203 Hyperesthesia: Secondary | ICD-10-CM | POA: Diagnosis not present

## 2018-06-15 DIAGNOSIS — M25622 Stiffness of left elbow, not elsewhere classified: Secondary | ICD-10-CM | POA: Diagnosis not present

## 2018-06-15 DIAGNOSIS — M79602 Pain in left arm: Secondary | ICD-10-CM | POA: Diagnosis not present

## 2018-06-16 DIAGNOSIS — R011 Cardiac murmur, unspecified: Secondary | ICD-10-CM | POA: Diagnosis not present

## 2018-06-16 DIAGNOSIS — E876 Hypokalemia: Secondary | ICD-10-CM | POA: Diagnosis not present

## 2018-06-16 DIAGNOSIS — R002 Palpitations: Secondary | ICD-10-CM | POA: Diagnosis not present

## 2018-06-16 DIAGNOSIS — J449 Chronic obstructive pulmonary disease, unspecified: Secondary | ICD-10-CM | POA: Diagnosis not present

## 2018-06-21 DIAGNOSIS — M25622 Stiffness of left elbow, not elsewhere classified: Secondary | ICD-10-CM | POA: Diagnosis not present

## 2018-06-21 DIAGNOSIS — S42492D Other displaced fracture of lower end of left humerus, subsequent encounter for fracture with routine healing: Secondary | ICD-10-CM | POA: Diagnosis not present

## 2018-06-21 DIAGNOSIS — G5632 Lesion of radial nerve, left upper limb: Secondary | ICD-10-CM | POA: Diagnosis not present

## 2018-06-21 DIAGNOSIS — R203 Hyperesthesia: Secondary | ICD-10-CM | POA: Diagnosis not present

## 2018-06-21 DIAGNOSIS — M79602 Pain in left arm: Secondary | ICD-10-CM | POA: Diagnosis not present

## 2018-06-24 DIAGNOSIS — S42492D Other displaced fracture of lower end of left humerus, subsequent encounter for fracture with routine healing: Secondary | ICD-10-CM | POA: Diagnosis not present

## 2018-06-24 DIAGNOSIS — M79602 Pain in left arm: Secondary | ICD-10-CM | POA: Diagnosis not present

## 2018-06-24 DIAGNOSIS — G5632 Lesion of radial nerve, left upper limb: Secondary | ICD-10-CM | POA: Diagnosis not present

## 2018-06-24 DIAGNOSIS — M25622 Stiffness of left elbow, not elsewhere classified: Secondary | ICD-10-CM | POA: Diagnosis not present

## 2018-06-24 DIAGNOSIS — R203 Hyperesthesia: Secondary | ICD-10-CM | POA: Diagnosis not present

## 2018-06-27 DIAGNOSIS — M25622 Stiffness of left elbow, not elsewhere classified: Secondary | ICD-10-CM | POA: Diagnosis not present

## 2018-06-27 DIAGNOSIS — R203 Hyperesthesia: Secondary | ICD-10-CM | POA: Diagnosis not present

## 2018-06-27 DIAGNOSIS — G5632 Lesion of radial nerve, left upper limb: Secondary | ICD-10-CM | POA: Diagnosis not present

## 2018-06-27 DIAGNOSIS — M79602 Pain in left arm: Secondary | ICD-10-CM | POA: Diagnosis not present

## 2018-06-30 DIAGNOSIS — M25632 Stiffness of left wrist, not elsewhere classified: Secondary | ICD-10-CM | POA: Diagnosis not present

## 2018-06-30 DIAGNOSIS — M25622 Stiffness of left elbow, not elsewhere classified: Secondary | ICD-10-CM | POA: Diagnosis not present

## 2018-06-30 DIAGNOSIS — R203 Hyperesthesia: Secondary | ICD-10-CM | POA: Diagnosis not present

## 2018-06-30 DIAGNOSIS — G5632 Lesion of radial nerve, left upper limb: Secondary | ICD-10-CM | POA: Diagnosis not present

## 2018-06-30 DIAGNOSIS — M79602 Pain in left arm: Secondary | ICD-10-CM | POA: Diagnosis not present

## 2018-06-30 DIAGNOSIS — S42492D Other displaced fracture of lower end of left humerus, subsequent encounter for fracture with routine healing: Secondary | ICD-10-CM | POA: Diagnosis not present

## 2018-07-01 ENCOUNTER — Ambulatory Visit (INDEPENDENT_AMBULATORY_CARE_PROVIDER_SITE_OTHER): Payer: PPO | Admitting: Orthopaedic Surgery

## 2018-07-01 DIAGNOSIS — G4733 Obstructive sleep apnea (adult) (pediatric): Secondary | ICD-10-CM | POA: Diagnosis not present

## 2018-07-04 ENCOUNTER — Ambulatory Visit: Payer: PPO | Admitting: Primary Care

## 2018-07-04 ENCOUNTER — Encounter: Payer: Self-pay | Admitting: Primary Care

## 2018-07-04 DIAGNOSIS — G5761 Lesion of plantar nerve, right lower limb: Secondary | ICD-10-CM | POA: Diagnosis not present

## 2018-07-04 DIAGNOSIS — M2041 Other hammer toe(s) (acquired), right foot: Secondary | ICD-10-CM | POA: Diagnosis not present

## 2018-07-04 DIAGNOSIS — M2042 Other hammer toe(s) (acquired), left foot: Secondary | ICD-10-CM | POA: Diagnosis not present

## 2018-07-04 DIAGNOSIS — I479 Paroxysmal tachycardia, unspecified: Secondary | ICD-10-CM

## 2018-07-04 DIAGNOSIS — G5762 Lesion of plantar nerve, left lower limb: Secondary | ICD-10-CM | POA: Diagnosis not present

## 2018-07-04 DIAGNOSIS — B351 Tinea unguium: Secondary | ICD-10-CM | POA: Diagnosis not present

## 2018-07-04 DIAGNOSIS — I739 Peripheral vascular disease, unspecified: Secondary | ICD-10-CM | POA: Diagnosis not present

## 2018-07-04 HISTORY — DX: Paroxysmal tachycardia, unspecified: I47.9

## 2018-07-04 MED ORDER — PREDNISONE 10 MG PO TABS: ORAL_TABLET | ORAL | 0 refills | Status: DC

## 2018-07-04 NOTE — Assessment & Plan Note (Addendum)
Ambulatory HR 121  EKG in Jan showed NSR  Scheduled for echocardiogram in March  Recommend Holter monitor

## 2018-07-04 NOTE — Assessment & Plan Note (Signed)
Ambulatory O2 low 94% room air  Continue 2L nocturnal oxygen

## 2018-07-04 NOTE — Assessment & Plan Note (Addendum)
Recent exacerbation, treated by UC CXR showed mild atelectasis and hyperinflation Needs additional prednisone taper  Continue Symbicort 160 2bid, spiriva 21mcg daily PRN Albuterol hfa or neb q6hrs for sob/wheezing

## 2018-07-04 NOTE — Progress Notes (Addendum)
@Patient  ID: Virginia Burns, female    DOB: 1946/03/12, 73 y.o.   MRN: [id number]  Chief Complaint  Patient presents with  . Follow-up    Decreased O2 and High heart rate x3 weeks.      Referring provider: Berkley Harvey, NP  HPI: 73 year old female, former smoker. PMH significant for COPD and nocturnal hypoxemia. Patient of Dr. Halford Chessman, last seen August 2019. She uses 2L oxygen at night, has not worn CPAP since 2017. Recent UC visit for COPD exacerbation in Jan 2020.   07/04/2018 Patient presents today for acute visit with reports of oxygen desaturation with ambulation and increased HR. Reports some shortness of breath and wheezing. Denies cough. States that she had porcelin tile placed in her [place] 1 month ago. Reports that she inhaled some of the dust and experienced a panic attack. Over the next few days she still didn't feel right and went to urgent care. She was treated for COPD exacerbation and received Duoneb breathing treatment, 10mg  dexamethasone injection and was given RX prednisone. Saw her GP 1 week later for follow-up. Reports increased HR and ordered echocardiogram. She has been using nebulizer. Walked into her bathroom to wash up and reports O2 was 88% and HR 118.  She has an echocardiogram scheduled for march 4th. Denies chest pain.   Testing reviewed: 06/03/18 CXR showed hyperinflation and minimal left basilar atelectasis 06/03/18 EKG showed sinus rhythm, HR 92. No T-wave abnormalites  06/03/18 BMET/TSH normal   Allergies  Allergen Reactions  . Clindamycin/Lincomycin Hives  . Sulfur Rash    Immunization History  Administered Date(s) Administered  . Influenza Split 02/15/2013  . Influenza Whole 02/16/2012  . Influenza, High Dose Seasonal PF 02/28/2013, 02/20/2016, 03/05/2017, 03/18/2018  . Pneumococcal Conjugate-13 03/07/2013  . Pneumococcal Polysaccharide-23 03/02/2016  . Pneumococcal-Unspecified 01/10/2008  . Zoster 01/09/2010    Past Medical History:    Diagnosis Date  . Allergic rhinitis   . Asthma   . Complication of anesthesia    Versed- hard time working - "dinging for days" - Colonoscopy  . COPD (chronic obstructive pulmonary disease) ([place])   . Diverticulosis of colon (without mention of hemorrhage)   . Emphysema of lung ([place])   . Family history of adverse reaction to anesthesia    Mother- hard to awaken and confusion.  . Family history of malignant neoplasm of gastrointestinal tract   . Gastroparesis   . GERD (gastroesophageal reflux disease)   . Hypertension   . On home oxygen therapy    oxygen at night  . Osteoporosis   . Other esophagitis   . Personal history of colonic polyps 10/07/2009   tubular adenoma  . Shortness of breath dyspnea   . Urinary tract infection   . Vitamin B12 deficiency     Tobacco History: Social History   Tobacco Use  Smoking Status Former Smoker  . Packs/day: 1.00  . Years: 25.00  . Pack years: 25.00  . Types: Cigarettes  . Last attempt to quit: 05/18/2009  . Years since quitting: 9.1  Smokeless Tobacco Never Used   Counseling given: Not Answered   Outpatient Medications Prior to Visit  Medication Sig Dispense Refill  . albuterol (PROAIR HFA) 108 (90 BASE) MCG/ACT inhaler Inhale 2 puffs into the lungs every 6 (six) hours as needed for wheezing or shortness of breath.    Marland Dewalt albuterol (PROVENTIL) (2.5 MG/3ML) 0.083% nebulizer solution Inhale into the lungs every 6 (six) hours as needed for wheezing or shortness of  breath.     Marland Coonan amLODipine (NORVASC) 5 MG tablet Take 5 mg by mouth daily.     . budesonide-formoterol (SYMBICORT) 160-4.5 MCG/ACT inhaler Inhale 2 puffs into the lungs 2 (two) times daily. (Patient taking differently: Inhale 2 puffs into the lungs 2 (two) times daily. ) 1 Inhaler 5  . CARAFATE 1 GM/10ML suspension TAKE 2 TEASPOONFUL (10 MLS) BY MOUTH  4 TIMES DAILY WITH MEALS AND AT BEDTIME (Patient taking differently: Take 1 g by mouth 4 (four) times daily. ) 960 mL 1  . LORazepam  (ATIVAN) 0.5 MG tablet Take 0.5 mg by mouth as needed for anxiety.     . pantoprazole (PROTONIX) 40 MG tablet Take 40 mg by mouth 2 (two) times daily.     . polyethylene glycol (MIRALAX / GLYCOLAX) packet Take 17 g by mouth daily as needed for mild constipation.    Marland Bateson tiotropium (SPIRIVA) 18 MCG inhalation capsule Place 1 capsule (18 mcg total) into inhaler and inhale daily. (Patient taking differently: Place 18 mcg into inhaler and inhale daily. ) 90 capsule 3   No facility-administered medications prior to visit.     Review of Systems  Review of Systems  Constitutional: Negative.   HENT: Negative.   Respiratory: Positive for shortness of breath and wheezing. Negative for cough.   Cardiovascular: Negative for chest pain and leg swelling.    Physical Exam  BP 134/74 (BP Location: Right Arm, Cuff Size: Normal)   Pulse 96   Ht 5' 5.5" (1.664 m)   Wt 167 lb (75.8 kg)   SpO2 94%   BMI 27.37 kg/m  Physical Exam Constitutional:      General: She is not in acute distress.    Appearance: Normal appearance. She is not ill-appearing.  HENT:     Head: Normocephalic and atraumatic.     Nose: Nose normal.     Mouth/Throat:     Mouth: Mucous membranes are moist.     Pharynx: Oropharynx is clear.  Cardiovascular:     Rate and Rhythm: Normal rate and regular rhythm.     Heart sounds: Murmur present.  Pulmonary:     Effort: Pulmonary effort is normal.     Breath sounds: Normal breath sounds.  Skin:    General: Skin is warm and dry.  Neurological:     General: No focal deficit present.     Mental Status: She is alert and oriented to person, place, and time. Mental status is at baseline.  Psychiatric:        Mood and Affect: Mood normal.        Behavior: Behavior normal.        Thought Content: Thought content normal.        Judgment: Judgment normal.      Lab Results:  CBC    Component Value Date/Time   WBC 15.2 (H) 01/22/2018 1620   RBC 4.01 01/22/2018 1620   HGB 12.7  01/22/2018 1620   HCT 36.9 01/22/2018 1620   PLT 435 (H) 01/22/2018 1620   MCV 92.0 01/22/2018 1620   MCH 31.7 01/22/2018 1620   MCHC 34.4 01/22/2018 1620   RDW 13.0 01/22/2018 1620   LYMPHSABS 1.2 01/22/2018 1620   MONOABS 2.1 (H) 01/22/2018 1620   EOSABS 0.0 01/22/2018 1620   BASOSABS 0.0 01/22/2018 1620    BMET    Component Value Date/Time   NA 136 01/27/2018 0353   K 3.9 01/27/2018 0353   CL 98 01/27/2018 0353  CO2 34 (H) 01/27/2018 0353   GLUCOSE 91 01/27/2018 0353   BUN 7 (L) 01/27/2018 0353   CREATININE 0.58 01/27/2018 0353   CALCIUM 8.4 (L) 01/27/2018 0353   GFRNONAA >60 01/27/2018 0353   GFRAA >60 01/27/2018 0353    BNP No results found for: BNP  ProBNP    Component Value Date/Time   PROBNP 102.4 12/27/2012 0540    Imaging: No results found.   Assessment & Plan:   COPD (chronic obstructive pulmonary disease) Recent exacerbation, treated by UC CXR showed mild atelectasis and hyperinflation Needs additional prednisone taper  Continue Symbicort 160 2bid, spiriva 40mcg daily PRN Albuterol hfa or neb q6hrs for sob/wheezing   Tachycardia, paroxysmal (H[facility]) Ambulatory HR 121  EKG in Jan showed NSR  Scheduled for echocardiogram in March  Recommend Holter monitor   Chronic respiratory failure ([place]) Ambulatory O2 low 94% room air  Continue 2L nocturnal oxygen    Martyn Ehrich, NP 07/04/2018

## 2018-07-04 NOTE — Patient Instructions (Addendum)
Tachycardia: Echocardiogram in March  Please contact PCP and let them know we recommend 24 hour Holter monitor  Re: tachycardia   Awaiting for urgent care to fax [place] your CXR and EKG to review. If any concerns with results we will contact you.   COPD: Continue Symbicort and Spiriva as prescribed Ambulatory O2 saturation was normal today- lowest you went was 94% on room air. Continue wearing 2L oxygen at night  Use nebulizer every 6 hours as needed for shortness of breath/wheezing   RX: Prednsione taper   Follow up: Dr. Halford Chessman in August 2020

## 2018-07-05 DIAGNOSIS — M25632 Stiffness of left wrist, not elsewhere classified: Secondary | ICD-10-CM | POA: Diagnosis not present

## 2018-07-07 DIAGNOSIS — M25622 Stiffness of left elbow, not elsewhere classified: Secondary | ICD-10-CM | POA: Diagnosis not present

## 2018-07-07 DIAGNOSIS — G5632 Lesion of radial nerve, left upper limb: Secondary | ICD-10-CM | POA: Diagnosis not present

## 2018-07-07 DIAGNOSIS — R203 Hyperesthesia: Secondary | ICD-10-CM | POA: Diagnosis not present

## 2018-07-07 DIAGNOSIS — M25632 Stiffness of left wrist, not elsewhere classified: Secondary | ICD-10-CM | POA: Diagnosis not present

## 2018-07-07 DIAGNOSIS — M79602 Pain in left arm: Secondary | ICD-10-CM | POA: Diagnosis not present

## 2018-07-08 ENCOUNTER — Encounter (INDEPENDENT_AMBULATORY_CARE_PROVIDER_SITE_OTHER): Payer: Self-pay | Admitting: Orthopaedic Surgery

## 2018-07-08 ENCOUNTER — Ambulatory Visit (INDEPENDENT_AMBULATORY_CARE_PROVIDER_SITE_OTHER): Payer: PPO | Admitting: Orthopaedic Surgery

## 2018-07-08 VITALS — BP 147/73 | HR 87 | Ht 65.0 in | Wt 168.0 lb

## 2018-07-08 DIAGNOSIS — S42402D Unspecified fracture of lower end of left humerus, subsequent encounter for fracture with routine healing: Secondary | ICD-10-CM | POA: Diagnosis not present

## 2018-07-08 NOTE — Progress Notes (Signed)
Office Visit Note   Patient: Virginia Burns           Date of Birth: Sep 27, 1945           MRN: [id number] Visit Date: 07/08/2018              Requested by: Berkley Harvey, NP [place], [place] [address] PCP: Berkley Harvey, NP   Assessment & Plan: Visit Diagnoses:  1. Closed fracture of distal end of left humerus with routine healing, unspecified fracture morphology, subsequent encounter     Plan: Patient is gotten partial return of wrist extension.  She still has problems with a very thin forearm and problems with the wire which is subcutaneous in the ulna after the olecranon osteotomy.  We will check her back again in 2 months repeat x-rays of her left elbow AP and lateral and we can discuss outpatient removal of the figure-of-eight wire and olecranon screw since it is prominent bothersome and requires her to wear a sleeve to protect it.  Follow-Up Instructions: Return in about 2 months (around 09/06/2018).   Orders:  No orders of the defined types were placed in this encounter.  No orders of the defined types were placed in this encounter.     Procedures: No procedures performed   Clinical Data: No additional findings.   Subjective: Chief Complaint  Patient presents with  . Left Elbow - Follow-up    01/24/18 ORIF Left Distal Humerus, Olecranon Osteotomy    HPI 73 year old female returns post distal humerus fracture treated with double plating.  She had olecranon osteotomy and had radial nerve injury from her fracture.  She is gotten partial wrist return does not have finger extension yet but is still continue to work with therapy keeping her wrist and fingers loose to prevent contracture.  She still using a static splint.  She has problems with the figure-of-eight wire and screw prominent in the olecranon has to wear pad over the arm since she has a very thin forearm and bumps it and it causes pain.  Review of Systems reviewed updated unchanged  from last office visit.  Patient's had some recent pulmonary flare problems and was placed on prednisone.   Objective: Vital Signs: BP (!) 147/73   Pulse 87   Ht 5\' 5"  (1.651 m)   Wt 168 lb (76.2 kg)   BMI 27.96 kg/m   Physical Exam Constitutional:      Appearance: She is well-developed.  HENT:     Head: Normocephalic.     Right Ear: External ear normal.     Left Ear: External ear normal.  Eyes:     Pupils: Pupils are equal, round, and reactive to light.  Neck:     Thyroid: No thyromegaly.     Trachea: No tracheal deviation.  Cardiovascular:     Rate and Rhythm: Normal rate.  Pulmonary:     Effort: Pulmonary effort is normal.  Abdominal:     Palpations: Abdomen is soft.  Skin:    General: Skin is warm and dry.  Neurological:     Mental Status: She is alert and oriented to person, place, and time.  Psychiatric:        Behavior: Behavior normal.     Ortho Exam patient has some prominence of the olecranon figure-of-eight wire.  The palpable square knot is tender wires palpable due to extreme thin skin that she has she has multiple ecchymosis present over her  hands and forearms from prednisone in the past.  Tenderness over the tip of the olecranon screw.  There is some prominence of the lateral plate but not as tender as olecranon screw in the figure-of-eight wire.  Specialty Comments:  No specialty comments available.  Imaging: No results found.   PMFS History: Patient Active Problem List   Diagnosis Date Noted  . Tachycardia, paroxysmal ([place]) 07/04/2018  . 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 Humerus distal fracture 01/24/2018  . Chronic respiratory failure ([place]) 03/04/2015  . Hx of colonic polyps   . Benign neoplasm of ascending colon   . Benign neoplasm of descending colon   . OSA (obstructive sleep apnea) 09/18/2013  . History of tobacco abuse 09/18/2013  . Benign essential tremor 05/22/2013  . COPD (chronic obstructive pulmonary disease) ([place])  02/10/2013  . EROSIVE ESOPHAGITIS 10/12/2007  . HYPERTENSION 07/19/2007   Past Medical History:  Diagnosis Date  . Allergic rhinitis   . Asthma   . Complication of anesthesia    Versed- hard time working - "dinging for days" - Colonoscopy  . COPD (chronic obstructive pulmonary disease) ([place])   . Diverticulosis of colon (without mention of hemorrhage)   . Emphysema of lung ([place])   . Family history of adverse reaction to anesthesia    Mother- hard to awaken and confusion.  . Family history of malignant neoplasm of gastrointestinal tract   . Gastroparesis   . GERD (gastroesophageal reflux disease)   . Hypertension   . On home oxygen therapy    oxygen at night  . Osteoporosis   . Other esophagitis   . Personal history of colonic polyps 10/07/2009   tubular adenoma  . Shortness of breath dyspnea   . Urinary tract infection   . Vitamin B12 deficiency     Family History  Problem Relation Age of Onset  . COPD Mother        Deceased, 30  . Tremor Mother   . Colon cancer Cousin   . Diabetes Brother   . Colitis Son   . Tremor Maternal Aunt   . Other Father        Deceased, 93    Past Surgical History:  Procedure Laterality Date  . ABDOMINAL HYSTERECTOMY    . ABDOMINAL HYSTERECTOMY    . CATARACT EXTRACTION Right   . CHOLECYSTECTOMY    . COLONOSCOPY WITH PROPOFOL N/A 11/21/2014   Procedure: COLONOSCOPY WITH PROPOFOL;  Surgeon: Irene Shipper, MD;  Location: [facility];  Service: Endoscopy;  Laterality: N/A;  . FRACTURE SURGERY    . [place]  . LIGAMENT REPAIR Left 04/18/2013   Procedure: Triangular Fibrocartilage complex open repair;  Surgeon: Jolyn Nap, MD;  Location: [facility];  Service: Orthopedics;  Laterality: Left;  . OOPHORECTOMY    . OPEN REDUCTION INTERNAL FIXATION (ORIF) DISTAL RADIAL FRACTURE Left 04/18/2013   Procedure: LEFT OPEN REDUCTION INTERNAL FIXATION (ORIF) DISTAL RADIAL FRACTURE;  Surgeon: Jolyn Nap, MD;   Location: [facility];  Service: Orthopedics;  Laterality: Left;  . ORIF ELBOW FRACTURE Left 01/24/2018   Procedure: OPEN REDUCTION INTERNAL FIXATION (ORIF) DISTAL HUMERUS  ELBOW/OLECRANON OSTEOTOMY FRACTURE;  Surgeon: [name], MD;  Location: [facility];  Service: Orthopedics;  Laterality: Left;   Social History   Occupational History  . Occupation: Architectural technologist: [employer]  Tobacco Use  . Smoking status: Former Smoker    Packs/day: 1.00  Years: 25.00    Pack years: 25.00    Types: Cigarettes    Last attempt to quit: 05/18/2009    Years since quitting: 9.1  . Smokeless tobacco: Never Used  Substance and Sexual Activity  . Alcohol use: Yes    Alcohol/week: 14.0 standard drinks    Types: 14 Glasses of wine per week  . Drug use: No  . Sexual activity: Not on file

## 2018-07-11 NOTE — Progress Notes (Signed)
Reviewed and agree with assessment/plan.   Rylin Saez, MD [facility] 05/13/2016, 12:24 PM Pager:  [phone]  

## 2018-07-12 DIAGNOSIS — M25622 Stiffness of left elbow, not elsewhere classified: Secondary | ICD-10-CM | POA: Diagnosis not present

## 2018-07-12 DIAGNOSIS — M79602 Pain in left arm: Secondary | ICD-10-CM | POA: Diagnosis not present

## 2018-07-12 DIAGNOSIS — R203 Hyperesthesia: Secondary | ICD-10-CM | POA: Diagnosis not present

## 2018-07-12 DIAGNOSIS — M25632 Stiffness of left wrist, not elsewhere classified: Secondary | ICD-10-CM | POA: Diagnosis not present

## 2018-07-12 DIAGNOSIS — G5632 Lesion of radial nerve, left upper limb: Secondary | ICD-10-CM | POA: Diagnosis not present

## 2018-07-12 NOTE — Progress Notes (Signed)
Cardiology Office Note:    Date:  07/14/2018   ID:  Virginia Burns, DOB [birth year], MRN [id number]  PCP:  Berkley Harvey, NP  Cardiologist:  Shirlee More, MD   Referring MD: Berkley Harvey, NP  ASSESSMENT:    1. Aortic ejection murmur   2. Hypertension, benign   3. Palpitations   4. COPD (chronic obstructive pulmonary disease) with chronic bronchitis (HCC)    PLAN:    In order of problems listed above:  1. 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  There is value in having her echocardiogram performed I can see it myself and after discussion will have it done in my organization and I told her I would contact her with the results.  If she had severe stenosis she did need close monitoring or referral for intervention. 2. Stable hypertension continue treatment with her calcium channel blocker which is ideal to avoid interactions with her lung disease 3. She had a single episode of rapid heart rhythm associated with an acute exacerbation COPD with seen urgently had an EKG that she relates is normal has had no clinical recurrence at this time I would not pursue an ischemia evaluation unless she has recurrent episodes 4. Stable well managed by her primary care physician  Next appointment 3 to 4 weeks   Medication Adjustments/Labs and Tests Ordered: Current medicines are reviewed at length with the patient today.  Concerns regarding medicines are outlined above.  Orders Placed This Encounter  Procedures  . EKG 12-Lead  . ECHOCARDIOGRAM COMPLETE   No orders of the defined types were placed in this encounter.    Chief Complaint  Patient presents with  . Palpitations    for cariology consultation  . Heart Murmur  3 to 4 weeks after echocardiogram  History of Present Illness:    Virginia Burns is a 73 y.o. female with COPD and sleep apnea who is being seen today for the evaluation of palpitation at  the request of Berkley Harvey, NP. This visit was prompted by an episode 2 months ago where she had worked on her home dust inhalation acute bronchospasm had her heart racing was diaphoretic short of breath wheezing was seen in urgent care she had an EKG chest x-ray treated with inhaled nebulizer and steroids and improved.  Since that time she has had no clinical recurrence I do not have the records but she told me she did not have arrhythmia.  She has a background history of an admission at [facility] in 2012 and had a stress test performed that was related to is normal.  She also has had an echocardiogram in the past for heart murmur.  She remains very active despite her COPD is short of breath with more than usual activity stable pattern and has had no edema orthopnea chest pain syncope or TIA she has no known history of congenital or rheumatic heart disease.  At home her heart rates have been running in the range of 70 to 85 bpm and oxygen saturation in the mid 90s.  Past Medical History:  Diagnosis Date  . Adjustment disorder with anxiety 08/17/2013  . Allergic rhinitis   . Anxiety state 08/17/2013   Overview:  IMPRESSION: hx of anxiety, lost a friend and has friend in ICU, using the ativan prn. had refilled 07/27/13  . Asthma   . Benign neoplasm of ascending colon 12/07/2014  .  Benign neoplasm of descending colon 12/07/2014  . Centrilobular emphysema ([place]) 12/07/2014  . Chronic respiratory failure ([place]) 03/04/2015  . Complication of anesthesia    Versed- hard time working - "dinging for days" - Colonoscopy  . COPD (chronic obstructive pulmonary disease) ([place])   . COPD (chronic obstructive pulmonary disease) with chronic bronchitis ([place]) 02/10/2013   Overnight pulse oximetry shows desaturations greater than 5 minutes at a time less than 88%.  Order signed for nocturnal O2 at 2 L/m February 21, 2016  Overview:  Overnight pulse oximetry shows desaturations greater than 5 minutes at a time  less than 88%.  Order signed for nocturnal O2 at 2 L/m February 21, 2016 Overview:  Overnight pulse oximetry shows desaturations greater than 5 minutes at a time l  . 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 therapy 12/07/2014  . Diverticulosis of colon 10/12/2007   Qualifier: Diagnosis of  By: Nils Pyle CMA (AAMA), Mearl Latin    . Diverticulosis of colon (without mention of hemorrhage)   . Emphysema of lung ([place])   . Essential tremor 05/22/2013  . Family history of adverse reaction to anesthesia    Mother- hard to awaken and confusion.  . Family history of malignant neoplasm of gastrointestinal tract   . Gastroparesis   . GERD (gastroesophageal reflux disease)   . GERD with esophagitis 10/12/2007   Qualifier: Diagnosis of  By: Nils Pyle CMA (AAMA), Mearl Latin    Overview:  Overview:  Qualifier: Diagnosis of  By: Nils Pyle CMA Deborra Medina), Mearl Latin  Overview:  Overview:  Qualifier: Diagnosis of  By: Nils Pyle CMA (Deborra Medina), Mearl Latin   . History of tobacco abuse 09/18/2013  . Humerus distal fracture 01/24/2018  . Hx of colonic polyps   . Hypertension   . Hypertension, benign 07/19/2007   Qualifier: Diagnosis of  By: Garen Grams    Overview:  Overview:  STORY: The goal for blood pressure is less than 140/90.  If you are checking your blood pressure at home, please record it and bring it to your next office visit. Following the Dietary Approaches to Stop Hypertension (DASH) diet (3 servings of fruit and vegetables daily, whole grains, low sodium, low-fat proteins) and exercisin  . Insomnia 10/22/2012  . Multinodular goiter 06/01/2017  . 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 Obstructive sleep apnea (adult) (pediatric) 09/18/2013   Overview:  Last Assessment & Plan:  She has mild sleep apnea.  I have reviewed the recent sleep study results with the patient.  We discussed how sleep apnea can affect various health problems including risks for hypertension, cardiovascular disease, and diabetes.  We also discussed how sleep disruption can increase  risks for accident, such as while driving.  Weight loss as a means of improving sl  . On home oxygen therapy    oxygen at night  . OSA (obstructive sleep apnea) 09/18/2013  . Osteoarthrosis 12/09/2012  . Osteopenia of multiple sites 10/12/2007   Qualifier: Diagnosis of  By: Nils Pyle CMA ([place]), Mearl Latin    . Osteoporosis   . Other esophagitis   . Personal history of colonic polyps 10/07/2009   tubular adenoma  . Shortness of breath dyspnea   . Tachycardia, paroxysmal ([place]) 07/04/2018  . Urinary tract infection   . Vitamin B12 deficiency   . Vitamin D deficiency 12/09/2012   Overview:  Overview:  IMPRESSION: Appropiate labs done today. We will send the results and adjust treatment as needed. Bone density discussed. There has  been an improvement in her bone density comparing the one in 2009  from the one 2011. From osteoporotic she went to osteopenic. She did take Actonel 3 years ago for several years and stopped it due to GI side effects. We will continue mantaining f    Past Surgical History:  Procedure Laterality Date  . ABDOMINAL HYSTERECTOMY    . ABDOMINAL HYSTERECTOMY    . CATARACT EXTRACTION Right   . CHOLECYSTECTOMY    . COLONOSCOPY WITH PROPOFOL N/A 11/21/2014   Procedure: COLONOSCOPY WITH PROPOFOL;  Surgeon: Irene Shipper, MD;  Location: [facility];  Service: Endoscopy;  Laterality: N/A;  . FRACTURE SURGERY    . [place]  . LIGAMENT REPAIR Left 04/18/2013   Procedure: Triangular Fibrocartilage complex open repair;  Surgeon: Jolyn Nap, MD;  Location: [facility];  Service: Orthopedics;  Laterality: Left;  . OOPHORECTOMY    . OPEN REDUCTION INTERNAL FIXATION (ORIF) DISTAL RADIAL FRACTURE Left 04/18/2013   Procedure: LEFT OPEN REDUCTION INTERNAL FIXATION (ORIF) DISTAL RADIAL FRACTURE;  Surgeon: Jolyn Nap, MD;  Location: [facility];  Service: Orthopedics;  Laterality: Left;  . ORIF ELBOW FRACTURE Left 01/24/2018   Procedure: OPEN  REDUCTION INTERNAL FIXATION (ORIF) DISTAL HUMERUS  ELBOW/OLECRANON OSTEOTOMY FRACTURE;  Surgeon: [name], MD;  Location: [facility];  Service: Orthopedics;  Laterality: Left;    Current Medications: Current Meds  Medication Sig  . albuterol (PROAIR HFA) 108 (90 BASE) MCG/ACT inhaler Inhale 2 puffs into the lungs every 6 (six) hours as needed for wheezing or shortness of breath.  Marland Debenedetto albuterol (PROVENTIL) (2.5 MG/3ML) 0.083% nebulizer solution Inhale into the lungs every 6 (six) hours as needed for wheezing or shortness of breath.   Marland Crossen amLODipine (NORVASC) 5 MG tablet Take 5 mg by mouth daily.   Marland Ruud aspirin EC 81 MG tablet Take 81 mg by mouth every other day.  . budesonide-formoterol (SYMBICORT) 160-4.5 MCG/ACT inhaler Inhale 2 puffs into the lungs 2 (two) times daily.  Marland Pound CARAFATE 1 GM/10ML suspension TAKE 2 TEASPOONFUL (10 MLS) BY MOUTH  4 TIMES DAILY WITH MEALS AND AT BEDTIME  . LORazepam (ATIVAN) 0.5 MG tablet Take 0.5 mg by mouth as needed for anxiety.   . pantoprazole (PROTONIX) 40 MG tablet Take 40 mg by mouth 2 (two) times daily.   . polyethylene glycol (MIRALAX / GLYCOLAX) packet Take 17 g by mouth daily as needed for mild constipation.  . predniSONE (DELTASONE) 10 MG tablet Take 2 tabs x 1 week; then 1 tab x 1 week; stop  . tiotropium (SPIRIVA) 18 MCG inhalation capsule Place 1 capsule (18 mcg total) into inhaler and inhale daily.     Allergies:   Clindamycin/lincomycin and Sulfur   Social History   Socioeconomic History  . Marital status: Single    Spouse name: Not on file  . Number of children: 1  . Years of education: Not on file  . Highest education level: Not on file  Occupational History  . Occupation: Architectural technologist: [employer]  . Financial resource strain: Not on file  . Food insecurity:    Worry: Not on file    Inability: Not on file  . Transportation needs:    Medical: Not on file    Non-medical: Not on file  Tobacco Use  . Smoking  status: Former Smoker    Packs/day: 1.00    Years: 25.00    Pack years: 25.00    Types: Cigarettes    Last attempt to quit:  05/18/2009    Years since quitting: 9.1  . Smokeless tobacco: Never Used  Substance and Sexual Activity  . Alcohol use: Yes    Alcohol/week: 2.0 standard drinks    Types: 2 Glasses of wine per week    Comment: 2 glasses of wine each night  . Drug use: No  . Sexual activity: Not on file  Lifestyle  . Physical activity:    Days per week: Not on file    Minutes per session: Not on file  . Stress: Not on file  Relationships  . Social connections:    Talks on phone: Not on file    Gets together: Not on file    Attends religious service: Not on file    Active member of club or organization: Not on file    Attends meetings of clubs or organizations: Not on file    Relationship status: Not on file  Other Topics Concern  . 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    She lives with son.  She is divorced.           Family History: The patient's family history includes COPD in her mother; Colitis in her son; Colon cancer in her cousin; Diabetes in her brother; Diabetes type II in her son; Emphysema in her mother; Other in her father; Tremor in her maternal aunt and mother.  ROS:   Review of Systems  Constitution: Negative.  HENT: Negative.   Eyes: Negative.   Cardiovascular: Positive for palpitations (with acute COPD exacerbation).  Respiratory: Positive for shortness of breath and wheezing.   Endocrine: Negative.   Hematologic/Lymphatic: Negative.   Skin: Negative.   Musculoskeletal: Positive for joint pain.  Gastrointestinal: Positive for constipation.  Genitourinary: Negative.   Neurological: Negative.   Psychiatric/Behavioral: Negative.   Allergic/Immunologic: Negative.    Please see the history of present illness.     All other systems reviewed and are negative.  EKGs/Labs/Other Studies  Reviewed:    The following studies were reviewed today:   EKG:  EKG is  ordered today.  The ekg ordered today demonstrates to my personal review sinus rhythm left axis deviation low voltage QS in V2 possible previous septal MI  Recent Labs: 01/22/2018: Hemoglobin 12.7; Platelets 435 01/27/2018: BUN 7; Creatinine, Ser 0.58; Potassium 3.9; Sodium 136  Recent Lipid Panel No results found for: CHOL, TRIG, HDL, CHOLHDL, VLDL, LDLCALC, LDLDIRECT  Physical Exam:    VS:  BP (!) 150/76 (BP Location: Right Arm, Patient Position: Sitting, Cuff Size: Normal)   Pulse 84   Ht 5\' 6"  (1.676 m)   Wt 168 lb 12.8 oz (76.6 kg)   SpO2 94%   BMI 27.25 kg/m     Wt Readings from Last 3 Encounters:  07/14/18 168 lb 12.8 oz (76.6 kg)  07/08/18 168 lb (76.2 kg)  07/04/18 167 lb (75.8 kg)     GEN: She does not look chronically ill or debilitated well nourished, well developed in no acute distress HEENT: Normal NECK: No JVD; No carotid bruits LYMPHATICS: No lymphadenopathy CARDIAC: Grade 2/6 midsystolic peaking ejection murmur radiates to the right clavicle S2 is normal no aortic regurgitation full carotids without transmitted murmur or bruit RRR, no murmurs, rubs, gallops RESPIRATORY: Bilateral diminished breath sounds without rales, wheezing or rhonchi  ABDOMEN: Soft, non-tender, non-distended MUSCULOSKELETAL:  No edema; No deformity  SKIN: Warm and dry NEUROLOGIC:  Alert and oriented x 3 PSYCHIATRIC:  Normal affect     Signed, Shirlee More, MD  07/14/2018 12:26 PM    [facility]

## 2018-07-14 ENCOUNTER — Ambulatory Visit (INDEPENDENT_AMBULATORY_CARE_PROVIDER_SITE_OTHER): Payer: PPO | Admitting: Cardiology

## 2018-07-14 ENCOUNTER — Encounter: Payer: Self-pay | Admitting: Cardiology

## 2018-07-14 VITALS — BP 150/76 | HR 84 | Ht 66.0 in | Wt 168.8 lb

## 2018-07-14 DIAGNOSIS — J449 Chronic obstructive pulmonary disease, unspecified: Secondary | ICD-10-CM | POA: Diagnosis not present

## 2018-07-14 DIAGNOSIS — I10 Essential (primary) hypertension: Secondary | ICD-10-CM

## 2018-07-14 DIAGNOSIS — R203 Hyperesthesia: Secondary | ICD-10-CM | POA: Diagnosis not present

## 2018-07-14 DIAGNOSIS — R002 Palpitations: Secondary | ICD-10-CM

## 2018-07-14 DIAGNOSIS — I351 Nonrheumatic aortic (valve) insufficiency: Secondary | ICD-10-CM

## 2018-07-14 DIAGNOSIS — M79602 Pain in left arm: Secondary | ICD-10-CM | POA: Diagnosis not present

## 2018-07-14 DIAGNOSIS — G5632 Lesion of radial nerve, left upper limb: Secondary | ICD-10-CM | POA: Diagnosis not present

## 2018-07-14 DIAGNOSIS — M25622 Stiffness of left elbow, not elsewhere classified: Secondary | ICD-10-CM | POA: Diagnosis not present

## 2018-07-14 DIAGNOSIS — M25632 Stiffness of left wrist, not elsewhere classified: Secondary | ICD-10-CM | POA: Diagnosis not present

## 2018-07-14 HISTORY — DX: Palpitations: R00.2

## 2018-07-14 HISTORY — DX: Nonrheumatic aortic (valve) insufficiency: I35.1

## 2018-07-14 NOTE — Patient Instructions (Signed)
Medication Instructions:  Your physician recommends that you continue on your current medications as directed. Please refer to the Current Medication list given to you today.  If you need a refill on your cardiac medications before your next appointment, please call your pharmacy.   Lab work: None  If you have labs (blood work) drawn today and your tests are completely normal, you will receive your results only by: Virginia Burns MyChart Message (if you have MyChart) OR . A paper copy in the mail If you have any lab test that is abnormal or we need to change your treatment, we will call you to review the results.  Testing/Procedures: You had an EKG today.   Your physician has requested that you have an echocardiogram. Echocardiography is a painless test that uses sound waves to create images of your heart. It provides your doctor with information about the size and shape of your heart and how well your heart's chambers and valves are working. This procedure takes approximately one hour. There are no restrictions for this procedure.  Follow-Up: At [facility], you and your health needs are our priority.  As part of our continuing mission to provide you with exceptional heart care, we have created designated Provider Care Teams.  These Care Teams include your primary Cardiologist (physician) and Advanced Practice Providers (APPs -  Physician Assistants and Nurse Practitioners) who all work together to provide you with the care you need, when you need it. You will need a follow up appointment in 4 weeks.       Echocardiogram An echocardiogram is a procedure that uses painless sound waves (ultrasound) to produce an image of the heart. Images from an echocardiogram can provide important information about:  Signs of coronary artery disease (CAD).  Aneurysm detection. An aneurysm is a weak or damaged part of an artery wall that bulges out from the normal force of blood pumping through the body.  Heart  size and shape. Changes in the size or shape of the heart can be associated with certain conditions, including heart failure, aneurysm, and CAD.  Heart muscle function.  Heart valve function.  Signs of a past heart attack.  Fluid buildup around the heart.  Thickening of the heart muscle.  A tumor or infectious growth around the heart valves. Tell a health care provider about:  Any allergies you have.  All medicines you are taking, including vitamins, herbs, eye drops, creams, and over-the-counter medicines.  Any blood disorders you have.  Any surgeries you have had.  Any medical conditions you have.  Whether you are pregnant or may be pregnant. What are the risks? Generally, this is a safe procedure. However, problems may occur, including:  Allergic reaction to dye (contrast) that may be used during the procedure. What happens before the procedure? No specific preparation is needed. You may eat and drink normally. What happens during the procedure?   An IV tube may be inserted into one of your veins.  You may receive contrast through this tube. A contrast is an injection that improves the quality of the pictures from your heart.  A gel will be applied to your chest.  A wand-like tool (transducer) will be moved over your chest. The gel will help to transmit the sound waves from the transducer.  The sound waves will harmlessly bounce off of your heart to allow the heart images to be captured in real-time motion. The images will be recorded on a computer. The procedure may vary among health  care providers and hospitals. What happens after the procedure?  You may return to your normal, everyday life, including diet, activities, and medicines, unless your health care provider tells you not to do that. Summary  An echocardiogram is a procedure that uses painless sound waves (ultrasound) to produce an image of the heart.  Images from an echocardiogram can provide  important information about the size and shape of your heart, heart muscle function, heart valve function, and fluid buildup around your heart.  You do not need to do anything to prepare before this procedure. You may eat and drink normally.  After the echocardiogram is completed, you may return to your normal, everyday life, unless your health care provider tells you not to do that. This information is not intended to replace advice given to you by your health care provider. Make sure you discuss any questions you have with your health care provider. Document Released: 05/01/2000 Document Revised: 06/06/2016 Document Reviewed: 06/06/2016 Elsevier Interactive Patient Education  2019 [employer].

## 2018-07-18 ENCOUNTER — Ambulatory Visit (HOSPITAL_BASED_OUTPATIENT_CLINIC_OR_DEPARTMENT_OTHER)
Admission: RE | Admit: 2018-07-18 | Discharge: 2018-07-18 | Disposition: A | Discharge status: Home / Self Care | Payer: PPO | Source: Ambulatory Visit | Attending: Cardiology | Admitting: Cardiology

## 2018-07-18 DIAGNOSIS — I351 Nonrheumatic aortic (valve) insufficiency: Secondary | ICD-10-CM

## 2018-07-18 DIAGNOSIS — I10 Essential (primary) hypertension: Secondary | ICD-10-CM

## 2018-07-18 DIAGNOSIS — R002 Palpitations: Secondary | ICD-10-CM | POA: Diagnosis not present

## 2018-07-18 NOTE — Progress Notes (Signed)
  Echocardiogram 2D Echocardiogram has been performed.  Virginia Burns 07/18/2018, 3:15 PM

## 2018-07-19 DIAGNOSIS — S42492D Other displaced fracture of lower end of left humerus, subsequent encounter for fracture with routine healing: Secondary | ICD-10-CM | POA: Diagnosis not present

## 2018-07-19 DIAGNOSIS — G5632 Lesion of radial nerve, left upper limb: Secondary | ICD-10-CM | POA: Diagnosis not present

## 2018-07-19 DIAGNOSIS — M25632 Stiffness of left wrist, not elsewhere classified: Secondary | ICD-10-CM | POA: Diagnosis not present

## 2018-07-19 DIAGNOSIS — M25622 Stiffness of left elbow, not elsewhere classified: Secondary | ICD-10-CM | POA: Diagnosis not present

## 2018-07-19 DIAGNOSIS — R203 Hyperesthesia: Secondary | ICD-10-CM | POA: Diagnosis not present

## 2018-07-19 DIAGNOSIS — M79602 Pain in left arm: Secondary | ICD-10-CM | POA: Diagnosis not present

## 2018-07-21 DIAGNOSIS — M79602 Pain in left arm: Secondary | ICD-10-CM | POA: Diagnosis not present

## 2018-07-21 DIAGNOSIS — M25632 Stiffness of left wrist, not elsewhere classified: Secondary | ICD-10-CM | POA: Diagnosis not present

## 2018-07-21 DIAGNOSIS — G5632 Lesion of radial nerve, left upper limb: Secondary | ICD-10-CM | POA: Diagnosis not present

## 2018-07-21 DIAGNOSIS — R203 Hyperesthesia: Secondary | ICD-10-CM | POA: Diagnosis not present

## 2018-07-21 DIAGNOSIS — M25622 Stiffness of left elbow, not elsewhere classified: Secondary | ICD-10-CM | POA: Diagnosis not present

## 2018-07-30 DIAGNOSIS — G4733 Obstructive sleep apnea (adult) (pediatric): Secondary | ICD-10-CM | POA: Diagnosis not present

## 2018-08-01 DIAGNOSIS — M25622 Stiffness of left elbow, not elsewhere classified: Secondary | ICD-10-CM | POA: Diagnosis not present

## 2018-08-01 DIAGNOSIS — M25522 Pain in left elbow: Secondary | ICD-10-CM | POA: Diagnosis not present

## 2018-08-01 DIAGNOSIS — G5632 Lesion of radial nerve, left upper limb: Secondary | ICD-10-CM | POA: Diagnosis not present

## 2018-08-01 DIAGNOSIS — M25632 Stiffness of left wrist, not elsewhere classified: Secondary | ICD-10-CM | POA: Diagnosis not present

## 2018-08-04 ENCOUNTER — Ambulatory Visit: Payer: PPO | Admitting: Cardiology

## 2018-08-30 DIAGNOSIS — H524 Presbyopia: Secondary | ICD-10-CM | POA: Diagnosis not present

## 2018-08-30 DIAGNOSIS — G4733 Obstructive sleep apnea (adult) (pediatric): Secondary | ICD-10-CM | POA: Diagnosis not present

## 2018-08-30 DIAGNOSIS — H5203 Hypermetropia, bilateral: Secondary | ICD-10-CM | POA: Diagnosis not present

## 2018-08-30 DIAGNOSIS — H52223 Regular astigmatism, bilateral: Secondary | ICD-10-CM | POA: Diagnosis not present

## 2018-08-30 DIAGNOSIS — Z961 Presence of intraocular lens: Secondary | ICD-10-CM | POA: Diagnosis not present

## 2018-09-05 DIAGNOSIS — J069 Acute upper respiratory infection, unspecified: Secondary | ICD-10-CM | POA: Diagnosis not present

## 2018-09-05 DIAGNOSIS — J449 Chronic obstructive pulmonary disease, unspecified: Secondary | ICD-10-CM | POA: Diagnosis not present

## 2018-09-05 DIAGNOSIS — F4322 Adjustment disorder with anxiety: Secondary | ICD-10-CM | POA: Diagnosis not present

## 2018-09-05 DIAGNOSIS — R062 Wheezing: Secondary | ICD-10-CM | POA: Diagnosis not present

## 2018-09-09 ENCOUNTER — Ambulatory Visit (INDEPENDENT_AMBULATORY_CARE_PROVIDER_SITE_OTHER): Payer: PPO | Admitting: Orthopaedic Surgery

## 2018-09-20 DIAGNOSIS — M5431 Sciatica, right side: Secondary | ICD-10-CM | POA: Diagnosis not present

## 2018-09-20 DIAGNOSIS — Z87891 Personal history of nicotine dependence: Secondary | ICD-10-CM | POA: Diagnosis not present

## 2018-09-20 DIAGNOSIS — G250 Essential tremor: Secondary | ICD-10-CM | POA: Diagnosis not present

## 2018-09-29 DIAGNOSIS — G4733 Obstructive sleep apnea (adult) (pediatric): Secondary | ICD-10-CM | POA: Diagnosis not present

## 2018-10-02 DIAGNOSIS — G250 Essential tremor: Secondary | ICD-10-CM | POA: Diagnosis not present

## 2018-10-02 DIAGNOSIS — S32121A Minimally displaced Zone II fracture of sacrum, initial encounter for closed fracture: Secondary | ICD-10-CM | POA: Diagnosis not present

## 2018-10-02 DIAGNOSIS — Z7982 Long term (current) use of aspirin: Secondary | ICD-10-CM | POA: Diagnosis not present

## 2018-10-02 DIAGNOSIS — M8588 Other specified disorders of bone density and structure, other site: Secondary | ICD-10-CM | POA: Diagnosis not present

## 2018-10-02 DIAGNOSIS — E871 Hypo-osmolality and hyponatremia: Secondary | ICD-10-CM | POA: Diagnosis not present

## 2018-10-02 DIAGNOSIS — R339 Retention of urine, unspecified: Secondary | ICD-10-CM | POA: Diagnosis not present

## 2018-10-02 DIAGNOSIS — S3210XA Unspecified fracture of sacrum, initial encounter for closed fracture: Secondary | ICD-10-CM | POA: Diagnosis not present

## 2018-10-02 DIAGNOSIS — M549 Dorsalgia, unspecified: Secondary | ICD-10-CM | POA: Diagnosis not present

## 2018-10-02 DIAGNOSIS — J439 Emphysema, unspecified: Secondary | ICD-10-CM | POA: Diagnosis not present

## 2018-10-02 DIAGNOSIS — G4700 Insomnia, unspecified: Secondary | ICD-10-CM | POA: Diagnosis not present

## 2018-10-02 DIAGNOSIS — R000 Tachycardia, unspecified: Secondary | ICD-10-CM | POA: Diagnosis not present

## 2018-10-02 DIAGNOSIS — R262 Difficulty in walking, not elsewhere classified: Secondary | ICD-10-CM | POA: Diagnosis not present

## 2018-10-02 DIAGNOSIS — F4322 Adjustment disorder with anxiety: Secondary | ICD-10-CM | POA: Diagnosis not present

## 2018-10-02 DIAGNOSIS — Z7951 Long term (current) use of inhaled steroids: Secondary | ICD-10-CM | POA: Diagnosis not present

## 2018-10-02 DIAGNOSIS — Y999 Unspecified external cause status: Secondary | ICD-10-CM | POA: Diagnosis not present

## 2018-10-02 DIAGNOSIS — M545 Low back pain: Secondary | ICD-10-CM | POA: Diagnosis not present

## 2018-10-02 DIAGNOSIS — Z87891 Personal history of nicotine dependence: Secondary | ICD-10-CM | POA: Diagnosis not present

## 2018-10-02 DIAGNOSIS — X58XXXA Exposure to other specified factors, initial encounter: Secondary | ICD-10-CM | POA: Diagnosis not present

## 2018-10-02 DIAGNOSIS — I10 Essential (primary) hypertension: Secondary | ICD-10-CM | POA: Diagnosis not present

## 2018-10-02 DIAGNOSIS — R52 Pain, unspecified: Secondary | ICD-10-CM | POA: Diagnosis not present

## 2018-10-02 DIAGNOSIS — Z7409 Other reduced mobility: Secondary | ICD-10-CM | POA: Diagnosis not present

## 2018-10-02 DIAGNOSIS — K210 Gastro-esophageal reflux disease with esophagitis: Secondary | ICD-10-CM | POA: Diagnosis not present

## 2018-10-02 DIAGNOSIS — M8589 Other specified disorders of bone density and structure, multiple sites: Secondary | ICD-10-CM | POA: Diagnosis not present

## 2018-10-02 DIAGNOSIS — M810 Age-related osteoporosis without current pathological fracture: Secondary | ICD-10-CM | POA: Diagnosis not present

## 2018-10-02 DIAGNOSIS — M5442 Lumbago with sciatica, left side: Secondary | ICD-10-CM | POA: Diagnosis not present

## 2018-10-02 DIAGNOSIS — E042 Nontoxic multinodular goiter: Secondary | ICD-10-CM | POA: Diagnosis not present

## 2018-10-02 DIAGNOSIS — Z90710 Acquired absence of both cervix and uterus: Secondary | ICD-10-CM | POA: Diagnosis not present

## 2018-10-02 DIAGNOSIS — M5441 Lumbago with sciatica, right side: Secondary | ICD-10-CM | POA: Diagnosis not present

## 2018-10-02 DIAGNOSIS — M5489 Other dorsalgia: Secondary | ICD-10-CM | POA: Diagnosis not present

## 2018-10-02 DIAGNOSIS — Z86010 Personal history of colonic polyps: Secondary | ICD-10-CM | POA: Diagnosis not present

## 2018-10-02 DIAGNOSIS — M8468XA Pathological fracture in other disease, other site, initial encounter for fracture: Secondary | ICD-10-CM | POA: Diagnosis not present

## 2018-10-02 DIAGNOSIS — M8008XA Age-related osteoporosis with current pathological fracture, vertebra(e), initial encounter for fracture: Secondary | ICD-10-CM | POA: Diagnosis not present

## 2018-10-02 DIAGNOSIS — K3184 Gastroparesis: Secondary | ICD-10-CM | POA: Diagnosis not present

## 2018-10-02 DIAGNOSIS — J449 Chronic obstructive pulmonary disease, unspecified: Secondary | ICD-10-CM | POA: Diagnosis not present

## 2018-10-02 DIAGNOSIS — Z79899 Other long term (current) drug therapy: Secondary | ICD-10-CM | POA: Diagnosis not present

## 2018-10-02 DIAGNOSIS — Z9981 Dependence on supplemental oxygen: Secondary | ICD-10-CM | POA: Diagnosis not present

## 2018-10-02 DIAGNOSIS — Z1159 Encounter for screening for other viral diseases: Secondary | ICD-10-CM | POA: Diagnosis not present

## 2018-10-02 DIAGNOSIS — J432 Centrilobular emphysema: Secondary | ICD-10-CM | POA: Diagnosis not present

## 2018-10-02 DIAGNOSIS — G4733 Obstructive sleep apnea (adult) (pediatric): Secondary | ICD-10-CM | POA: Diagnosis not present

## 2018-10-02 DIAGNOSIS — E559 Vitamin D deficiency, unspecified: Secondary | ICD-10-CM | POA: Diagnosis not present

## 2018-10-02 DIAGNOSIS — Z9181 History of falling: Secondary | ICD-10-CM | POA: Diagnosis not present

## 2018-10-02 DIAGNOSIS — N3281 Overactive bladder: Secondary | ICD-10-CM | POA: Diagnosis not present

## 2018-10-02 DIAGNOSIS — Z8249 Family history of ischemic heart disease and other diseases of the circulatory system: Secondary | ICD-10-CM | POA: Diagnosis not present

## 2018-10-02 DIAGNOSIS — S32111A Minimally displaced Zone I fracture of sacrum, initial encounter for closed fracture: Secondary | ICD-10-CM | POA: Diagnosis not present

## 2018-10-02 DIAGNOSIS — R39198 Other difficulties with micturition: Secondary | ICD-10-CM | POA: Diagnosis not present

## 2018-10-02 DIAGNOSIS — G4730 Sleep apnea, unspecified: Secondary | ICD-10-CM | POA: Diagnosis not present

## 2018-10-02 DIAGNOSIS — J9610 Chronic respiratory failure, unspecified whether with hypoxia or hypercapnia: Secondary | ICD-10-CM | POA: Diagnosis not present

## 2018-10-02 DIAGNOSIS — S3219XA Other fracture of sacrum, initial encounter for closed fracture: Secondary | ICD-10-CM | POA: Diagnosis not present

## 2018-10-02 DIAGNOSIS — G629 Polyneuropathy, unspecified: Secondary | ICD-10-CM | POA: Diagnosis not present

## 2018-10-02 DIAGNOSIS — R0689 Other abnormalities of breathing: Secondary | ICD-10-CM | POA: Diagnosis not present

## 2018-10-02 DIAGNOSIS — M8448XA Pathological fracture, other site, initial encounter for fracture: Secondary | ICD-10-CM | POA: Diagnosis not present

## 2018-10-03 ENCOUNTER — Other Ambulatory Visit: Payer: Self-pay

## 2018-10-03 DIAGNOSIS — R262 Difficulty in walking, not elsewhere classified: Secondary | ICD-10-CM | POA: Insufficient documentation

## 2018-10-03 DIAGNOSIS — S322XXA Fracture of coccyx, initial encounter for closed fracture: Secondary | ICD-10-CM

## 2018-10-03 DIAGNOSIS — M5459 Other low back pain: Secondary | ICD-10-CM

## 2018-10-03 DIAGNOSIS — S3210XA Unspecified fracture of sacrum, initial encounter for closed fracture: Secondary | ICD-10-CM

## 2018-10-03 HISTORY — DX: Other low back pain: M54.59

## 2018-10-03 HISTORY — DX: Fracture of coccyx, initial encounter for closed fracture: S32.10XA

## 2018-10-03 HISTORY — DX: Difficulty in walking, not elsewhere classified: R26.2

## 2018-10-03 HISTORY — DX: Unspecified fracture of sacrum, initial encounter for closed fracture: S32.2XXA

## 2018-10-03 NOTE — Patient Outreach (Addendum)
[facility]) Care Management  10/03/2018  Virginia Burns 12-24-1945 [id number]   TELEPHONE SCREENING Referral date: 10/03/18 Referral source: nurse advise line Referral reason: leg and back pain Insurance: health team advantage  Received a return call from patient. HIPAA verified.  RNCM introduced herself and explained reason for call. Patient states she went to the emergency room last night and had CT scan and MRI done. Patient states her pelvis was broken. She states she was admitted to the hospital and she will eventually be transferred to a skilled nursing facility for rehab.  Patient states the physical therapist came to the hospital bedside today but she was unable to participate in therapy due to the pain.  Patient reports she was admitted to [facility] in  [place], [place].   PLAN: RNCM will close patient due to patient being admitted to the hospital.  RNCM will send closure letter to patients primary MD    Quinn Plowman RN,BSN,CCM [facility] Telephonic  [phone]

## 2018-10-03 NOTE — Patient Outreach (Signed)
[facility]) Care Management  10/03/2018  Virginia Burns 1945-07-26 [id number]   TELEPHONE SCREENING Referral date: 10/03/18 Referral source: nurse advise line Referral reason: leg and back pain Insurance: health team advantage  Attempt #1  Telephone call to patient regarding nurse advise line.  Unable to reach. HIPAA compliant voice message left with call back phone number.   PLAN: RNCM will attempt 2nd telephone call to patient within 4 business day. RNCM will send patient outreach letter to attempt contact.   Quinn Plowman RN,BSN,CCM [facility] Telephonic  [phone]

## 2018-10-04 DIAGNOSIS — R2681 Unsteadiness on feet: Secondary | ICD-10-CM | POA: Diagnosis not present

## 2018-10-04 DIAGNOSIS — M8588 Other specified disorders of bone density and structure, other site: Secondary | ICD-10-CM | POA: Diagnosis not present

## 2018-10-04 DIAGNOSIS — R279 Unspecified lack of coordination: Secondary | ICD-10-CM | POA: Diagnosis not present

## 2018-10-04 DIAGNOSIS — G4733 Obstructive sleep apnea (adult) (pediatric): Secondary | ICD-10-CM | POA: Diagnosis not present

## 2018-10-04 DIAGNOSIS — S32121A Minimally displaced Zone II fracture of sacrum, initial encounter for closed fracture: Secondary | ICD-10-CM | POA: Diagnosis not present

## 2018-10-04 DIAGNOSIS — M8468XA Pathological fracture in other disease, other site, initial encounter for fracture: Secondary | ICD-10-CM | POA: Diagnosis not present

## 2018-10-04 DIAGNOSIS — M6281 Muscle weakness (generalized): Secondary | ICD-10-CM | POA: Diagnosis not present

## 2018-10-04 DIAGNOSIS — M8008XD Age-related osteoporosis with current pathological fracture, vertebra(e), subsequent encounter for fracture with routine healing: Secondary | ICD-10-CM | POA: Diagnosis not present

## 2018-10-04 DIAGNOSIS — G4730 Sleep apnea, unspecified: Secondary | ICD-10-CM | POA: Diagnosis not present

## 2018-10-04 DIAGNOSIS — Z9181 History of falling: Secondary | ICD-10-CM | POA: Diagnosis not present

## 2018-10-04 DIAGNOSIS — I959 Hypotension, unspecified: Secondary | ICD-10-CM | POA: Diagnosis not present

## 2018-10-04 DIAGNOSIS — Z743 Need for continuous supervision: Secondary | ICD-10-CM | POA: Diagnosis not present

## 2018-10-04 DIAGNOSIS — Z90710 Acquired absence of both cervix and uterus: Secondary | ICD-10-CM | POA: Diagnosis not present

## 2018-10-04 DIAGNOSIS — Z86010 Personal history of colonic polyps: Secondary | ICD-10-CM | POA: Diagnosis not present

## 2018-10-04 DIAGNOSIS — M8589 Other specified disorders of bone density and structure, multiple sites: Secondary | ICD-10-CM | POA: Diagnosis not present

## 2018-10-04 DIAGNOSIS — Z87891 Personal history of nicotine dependence: Secondary | ICD-10-CM | POA: Diagnosis not present

## 2018-10-04 DIAGNOSIS — F4322 Adjustment disorder with anxiety: Secondary | ICD-10-CM | POA: Diagnosis not present

## 2018-10-04 DIAGNOSIS — Z8249 Family history of ischemic heart disease and other diseases of the circulatory system: Secondary | ICD-10-CM | POA: Diagnosis not present

## 2018-10-04 DIAGNOSIS — J432 Centrilobular emphysema: Secondary | ICD-10-CM | POA: Diagnosis not present

## 2018-10-04 DIAGNOSIS — M4848XA Fatigue fracture of vertebra, sacral and sacrococcygeal region, initial encounter for fracture: Secondary | ICD-10-CM | POA: Diagnosis not present

## 2018-10-04 DIAGNOSIS — Z01810 Encounter for preprocedural cardiovascular examination: Secondary | ICD-10-CM | POA: Diagnosis not present

## 2018-10-04 DIAGNOSIS — Z9989 Dependence on other enabling machines and devices: Secondary | ICD-10-CM | POA: Diagnosis not present

## 2018-10-04 DIAGNOSIS — R2689 Other abnormalities of gait and mobility: Secondary | ICD-10-CM | POA: Diagnosis not present

## 2018-10-04 DIAGNOSIS — E871 Hypo-osmolality and hyponatremia: Secondary | ICD-10-CM | POA: Diagnosis not present

## 2018-10-04 DIAGNOSIS — Z1159 Encounter for screening for other viral diseases: Secondary | ICD-10-CM | POA: Diagnosis not present

## 2018-10-04 DIAGNOSIS — I10 Essential (primary) hypertension: Secondary | ICD-10-CM | POA: Diagnosis not present

## 2018-10-04 DIAGNOSIS — J449 Chronic obstructive pulmonary disease, unspecified: Secondary | ICD-10-CM | POA: Diagnosis not present

## 2018-10-05 ENCOUNTER — Ambulatory Visit: Payer: Self-pay

## 2018-10-05 DIAGNOSIS — E871 Hypo-osmolality and hyponatremia: Secondary | ICD-10-CM

## 2018-10-05 HISTORY — DX: Hypo-osmolality and hyponatremia: E87.1

## 2018-10-08 DIAGNOSIS — N3000 Acute cystitis without hematuria: Secondary | ICD-10-CM | POA: Diagnosis not present

## 2018-10-08 DIAGNOSIS — R279 Unspecified lack of coordination: Secondary | ICD-10-CM | POA: Diagnosis not present

## 2018-10-08 DIAGNOSIS — I959 Hypotension, unspecified: Secondary | ICD-10-CM | POA: Diagnosis not present

## 2018-10-08 DIAGNOSIS — R2689 Other abnormalities of gait and mobility: Secondary | ICD-10-CM | POA: Diagnosis not present

## 2018-10-08 DIAGNOSIS — Z87310 Personal history of (healed) osteoporosis fracture: Secondary | ICD-10-CM | POA: Diagnosis not present

## 2018-10-08 DIAGNOSIS — M8008XD Age-related osteoporosis with current pathological fracture, vertebra(e), subsequent encounter for fracture with routine healing: Secondary | ICD-10-CM | POA: Diagnosis not present

## 2018-10-08 DIAGNOSIS — S32121D Minimally displaced Zone II fracture of sacrum, subsequent encounter for fracture with routine healing: Secondary | ICD-10-CM | POA: Diagnosis not present

## 2018-10-08 DIAGNOSIS — Z743 Need for continuous supervision: Secondary | ICD-10-CM | POA: Diagnosis not present

## 2018-10-08 DIAGNOSIS — M818 Other osteoporosis without current pathological fracture: Secondary | ICD-10-CM | POA: Diagnosis not present

## 2018-10-08 DIAGNOSIS — Z7189 Other specified counseling: Secondary | ICD-10-CM | POA: Diagnosis not present

## 2018-10-08 DIAGNOSIS — M810 Age-related osteoporosis without current pathological fracture: Secondary | ICD-10-CM | POA: Diagnosis not present

## 2018-10-08 DIAGNOSIS — J449 Chronic obstructive pulmonary disease, unspecified: Secondary | ICD-10-CM | POA: Diagnosis not present

## 2018-10-08 DIAGNOSIS — R2681 Unsteadiness on feet: Secondary | ICD-10-CM | POA: Diagnosis not present

## 2018-10-08 DIAGNOSIS — J432 Centrilobular emphysema: Secondary | ICD-10-CM | POA: Diagnosis not present

## 2018-10-08 DIAGNOSIS — M6281 Muscle weakness (generalized): Secondary | ICD-10-CM | POA: Diagnosis not present

## 2018-10-08 DIAGNOSIS — I10 Essential (primary) hypertension: Secondary | ICD-10-CM | POA: Diagnosis not present

## 2018-10-08 DIAGNOSIS — T148XXA Other injury of unspecified body region, initial encounter: Secondary | ICD-10-CM | POA: Diagnosis not present

## 2018-10-08 DIAGNOSIS — M8448XD Pathological fracture, other site, subsequent encounter for fracture with routine healing: Secondary | ICD-10-CM | POA: Diagnosis not present

## 2018-10-08 DIAGNOSIS — M8589 Other specified disorders of bone density and structure, multiple sites: Secondary | ICD-10-CM | POA: Diagnosis not present

## 2018-10-08 DIAGNOSIS — G4733 Obstructive sleep apnea (adult) (pediatric): Secondary | ICD-10-CM | POA: Diagnosis not present

## 2018-10-08 DIAGNOSIS — M8000XD Age-related osteoporosis with current pathological fracture, unspecified site, subsequent encounter for fracture with routine healing: Secondary | ICD-10-CM | POA: Diagnosis not present

## 2018-10-12 DIAGNOSIS — J449 Chronic obstructive pulmonary disease, unspecified: Secondary | ICD-10-CM | POA: Diagnosis not present

## 2018-10-12 DIAGNOSIS — M8000XD Age-related osteoporosis with current pathological fracture, unspecified site, subsequent encounter for fracture with routine healing: Secondary | ICD-10-CM | POA: Diagnosis not present

## 2018-10-12 DIAGNOSIS — N3000 Acute cystitis without hematuria: Secondary | ICD-10-CM | POA: Diagnosis not present

## 2018-10-12 DIAGNOSIS — I10 Essential (primary) hypertension: Secondary | ICD-10-CM | POA: Diagnosis not present

## 2018-10-21 DIAGNOSIS — M810 Age-related osteoporosis without current pathological fracture: Secondary | ICD-10-CM | POA: Diagnosis not present

## 2018-10-21 DIAGNOSIS — T148XXA Other injury of unspecified body region, initial encounter: Secondary | ICD-10-CM | POA: Diagnosis not present

## 2018-10-27 DIAGNOSIS — S32121D Minimally displaced Zone II fracture of sacrum, subsequent encounter for fracture with routine healing: Secondary | ICD-10-CM | POA: Diagnosis not present

## 2018-10-27 DIAGNOSIS — M8448XD Pathological fracture, other site, subsequent encounter for fracture with routine healing: Secondary | ICD-10-CM | POA: Diagnosis not present

## 2018-10-30 DIAGNOSIS — G4733 Obstructive sleep apnea (adult) (pediatric): Secondary | ICD-10-CM | POA: Diagnosis not present

## 2018-11-02 DIAGNOSIS — M818 Other osteoporosis without current pathological fracture: Secondary | ICD-10-CM | POA: Diagnosis not present

## 2018-11-02 DIAGNOSIS — Z7189 Other specified counseling: Secondary | ICD-10-CM | POA: Diagnosis not present

## 2018-11-02 DIAGNOSIS — Z87310 Personal history of (healed) osteoporosis fracture: Secondary | ICD-10-CM | POA: Diagnosis not present

## 2018-11-12 DIAGNOSIS — G4700 Insomnia, unspecified: Secondary | ICD-10-CM | POA: Diagnosis not present

## 2018-11-12 DIAGNOSIS — F4322 Adjustment disorder with anxiety: Secondary | ICD-10-CM | POA: Diagnosis not present

## 2018-11-12 DIAGNOSIS — J432 Centrilobular emphysema: Secondary | ICD-10-CM | POA: Diagnosis not present

## 2018-11-12 DIAGNOSIS — K210 Gastro-esophageal reflux disease with esophagitis: Secondary | ICD-10-CM | POA: Diagnosis not present

## 2018-11-12 DIAGNOSIS — Z87891 Personal history of nicotine dependence: Secondary | ICD-10-CM | POA: Diagnosis not present

## 2018-11-12 DIAGNOSIS — S60421D Blister (nonthermal) of left index finger, subsequent encounter: Secondary | ICD-10-CM | POA: Diagnosis not present

## 2018-11-12 DIAGNOSIS — K3184 Gastroparesis: Secondary | ICD-10-CM | POA: Diagnosis not present

## 2018-11-12 DIAGNOSIS — Z7951 Long term (current) use of inhaled steroids: Secondary | ICD-10-CM | POA: Diagnosis not present

## 2018-11-12 DIAGNOSIS — K5901 Slow transit constipation: Secondary | ICD-10-CM | POA: Diagnosis not present

## 2018-11-12 DIAGNOSIS — Z85038 Personal history of other malignant neoplasm of large intestine: Secondary | ICD-10-CM | POA: Diagnosis not present

## 2018-11-12 DIAGNOSIS — E871 Hypo-osmolality and hyponatremia: Secondary | ICD-10-CM | POA: Diagnosis not present

## 2018-11-12 DIAGNOSIS — M8008XD Age-related osteoporosis with current pathological fracture, vertebra(e), subsequent encounter for fracture with routine healing: Secondary | ICD-10-CM | POA: Diagnosis not present

## 2018-11-12 DIAGNOSIS — Z9181 History of falling: Secondary | ICD-10-CM | POA: Diagnosis not present

## 2018-11-12 DIAGNOSIS — Z7982 Long term (current) use of aspirin: Secondary | ICD-10-CM | POA: Diagnosis not present

## 2018-11-12 DIAGNOSIS — N390 Urinary tract infection, site not specified: Secondary | ICD-10-CM | POA: Diagnosis not present

## 2018-11-12 DIAGNOSIS — N3281 Overactive bladder: Secondary | ICD-10-CM | POA: Diagnosis not present

## 2018-11-12 DIAGNOSIS — G4733 Obstructive sleep apnea (adult) (pediatric): Secondary | ICD-10-CM | POA: Diagnosis not present

## 2018-11-13 DIAGNOSIS — N3281 Overactive bladder: Secondary | ICD-10-CM | POA: Diagnosis not present

## 2018-11-13 DIAGNOSIS — J432 Centrilobular emphysema: Secondary | ICD-10-CM | POA: Diagnosis not present

## 2018-11-13 DIAGNOSIS — Z85038 Personal history of other malignant neoplasm of large intestine: Secondary | ICD-10-CM | POA: Diagnosis not present

## 2018-11-13 DIAGNOSIS — G4700 Insomnia, unspecified: Secondary | ICD-10-CM | POA: Diagnosis not present

## 2018-11-13 DIAGNOSIS — Z87891 Personal history of nicotine dependence: Secondary | ICD-10-CM | POA: Diagnosis not present

## 2018-11-13 DIAGNOSIS — E871 Hypo-osmolality and hyponatremia: Secondary | ICD-10-CM | POA: Diagnosis not present

## 2018-11-13 DIAGNOSIS — M8008XD Age-related osteoporosis with current pathological fracture, vertebra(e), subsequent encounter for fracture with routine healing: Secondary | ICD-10-CM | POA: Diagnosis not present

## 2018-11-13 DIAGNOSIS — Z9181 History of falling: Secondary | ICD-10-CM | POA: Diagnosis not present

## 2018-11-13 DIAGNOSIS — S60421D Blister (nonthermal) of left index finger, subsequent encounter: Secondary | ICD-10-CM | POA: Diagnosis not present

## 2018-11-13 DIAGNOSIS — F4322 Adjustment disorder with anxiety: Secondary | ICD-10-CM | POA: Diagnosis not present

## 2018-11-13 DIAGNOSIS — K5901 Slow transit constipation: Secondary | ICD-10-CM | POA: Diagnosis not present

## 2018-11-13 DIAGNOSIS — K3184 Gastroparesis: Secondary | ICD-10-CM | POA: Diagnosis not present

## 2018-11-13 DIAGNOSIS — N390 Urinary tract infection, site not specified: Secondary | ICD-10-CM | POA: Diagnosis not present

## 2018-11-13 DIAGNOSIS — K210 Gastro-esophageal reflux disease with esophagitis: Secondary | ICD-10-CM | POA: Diagnosis not present

## 2018-11-13 DIAGNOSIS — Z7982 Long term (current) use of aspirin: Secondary | ICD-10-CM | POA: Diagnosis not present

## 2018-11-13 DIAGNOSIS — Z7951 Long term (current) use of inhaled steroids: Secondary | ICD-10-CM | POA: Diagnosis not present

## 2018-11-13 DIAGNOSIS — G4733 Obstructive sleep apnea (adult) (pediatric): Secondary | ICD-10-CM | POA: Diagnosis not present

## 2018-11-14 DIAGNOSIS — M8008XD Age-related osteoporosis with current pathological fracture, vertebra(e), subsequent encounter for fracture with routine healing: Secondary | ICD-10-CM | POA: Diagnosis not present

## 2018-11-14 DIAGNOSIS — M810 Age-related osteoporosis without current pathological fracture: Secondary | ICD-10-CM | POA: Diagnosis not present

## 2018-11-14 DIAGNOSIS — M8589 Other specified disorders of bone density and structure, multiple sites: Secondary | ICD-10-CM | POA: Diagnosis not present

## 2018-11-14 DIAGNOSIS — Z780 Asymptomatic menopausal state: Secondary | ICD-10-CM | POA: Diagnosis not present

## 2018-11-17 DIAGNOSIS — S32121D Minimally displaced Zone II fracture of sacrum, subsequent encounter for fracture with routine healing: Secondary | ICD-10-CM | POA: Diagnosis not present

## 2018-11-17 DIAGNOSIS — M8448XD Pathological fracture, other site, subsequent encounter for fracture with routine healing: Secondary | ICD-10-CM | POA: Diagnosis not present

## 2018-11-25 DIAGNOSIS — I739 Peripheral vascular disease, unspecified: Secondary | ICD-10-CM | POA: Diagnosis not present

## 2018-11-25 DIAGNOSIS — M2042 Other hammer toe(s) (acquired), left foot: Secondary | ICD-10-CM | POA: Diagnosis not present

## 2018-11-25 DIAGNOSIS — G5762 Lesion of plantar nerve, left lower limb: Secondary | ICD-10-CM | POA: Diagnosis not present

## 2018-11-25 DIAGNOSIS — G5761 Lesion of plantar nerve, right lower limb: Secondary | ICD-10-CM | POA: Diagnosis not present

## 2018-11-25 DIAGNOSIS — M2041 Other hammer toe(s) (acquired), right foot: Secondary | ICD-10-CM | POA: Diagnosis not present

## 2018-11-25 DIAGNOSIS — B351 Tinea unguium: Secondary | ICD-10-CM | POA: Diagnosis not present

## 2018-11-29 DIAGNOSIS — G4733 Obstructive sleep apnea (adult) (pediatric): Secondary | ICD-10-CM | POA: Diagnosis not present

## 2018-12-06 ENCOUNTER — Telehealth: Payer: Self-pay | Admitting: Internal Medicine

## 2018-12-06 NOTE — Telephone Encounter (Signed)
Patient is needing a refill for medication

## 2018-12-09 MED ORDER — AMBULATORY NON FORMULARY MEDICATION: 10.0000 mg | Freq: Three times a day (TID) | 3 refills | Status: DC

## 2018-12-09 NOTE — Telephone Encounter (Signed)
Faxed rx for Domiperdone to [place] [facility]

## 2018-12-09 NOTE — Telephone Encounter (Signed)
Virginia Burns needs a refill on her Domperidone but she is about 6 months overdue for an office visit.  I called her to schedule this and she told me she has been in rehab from a broken pelvis and can barely walk.  She said she should be able to come without problems in just a couple of months.  Would that be ok to refill?

## 2018-12-09 NOTE — Telephone Encounter (Signed)
Okay to refill? 

## 2018-12-13 DIAGNOSIS — J432 Centrilobular emphysema: Secondary | ICD-10-CM | POA: Diagnosis not present

## 2018-12-13 DIAGNOSIS — L989 Disorder of the skin and subcutaneous tissue, unspecified: Secondary | ICD-10-CM | POA: Diagnosis not present

## 2018-12-13 DIAGNOSIS — R0902 Hypoxemia: Secondary | ICD-10-CM | POA: Diagnosis not present

## 2018-12-21 DIAGNOSIS — Z7951 Long term (current) use of inhaled steroids: Secondary | ICD-10-CM | POA: Diagnosis not present

## 2018-12-21 DIAGNOSIS — K5901 Slow transit constipation: Secondary | ICD-10-CM | POA: Diagnosis not present

## 2018-12-21 DIAGNOSIS — M8008XD Age-related osteoporosis with current pathological fracture, vertebra(e), subsequent encounter for fracture with routine healing: Secondary | ICD-10-CM | POA: Diagnosis not present

## 2018-12-21 DIAGNOSIS — G4733 Obstructive sleep apnea (adult) (pediatric): Secondary | ICD-10-CM | POA: Diagnosis not present

## 2018-12-21 DIAGNOSIS — Z9181 History of falling: Secondary | ICD-10-CM | POA: Diagnosis not present

## 2018-12-21 DIAGNOSIS — K3184 Gastroparesis: Secondary | ICD-10-CM | POA: Diagnosis not present

## 2018-12-21 DIAGNOSIS — N3281 Overactive bladder: Secondary | ICD-10-CM | POA: Diagnosis not present

## 2018-12-21 DIAGNOSIS — E871 Hypo-osmolality and hyponatremia: Secondary | ICD-10-CM | POA: Diagnosis not present

## 2018-12-21 DIAGNOSIS — Z87891 Personal history of nicotine dependence: Secondary | ICD-10-CM | POA: Diagnosis not present

## 2018-12-21 DIAGNOSIS — N390 Urinary tract infection, site not specified: Secondary | ICD-10-CM | POA: Diagnosis not present

## 2018-12-21 DIAGNOSIS — S60421D Blister (nonthermal) of left index finger, subsequent encounter: Secondary | ICD-10-CM | POA: Diagnosis not present

## 2018-12-21 DIAGNOSIS — Z85038 Personal history of other malignant neoplasm of large intestine: Secondary | ICD-10-CM | POA: Diagnosis not present

## 2018-12-21 DIAGNOSIS — F4322 Adjustment disorder with anxiety: Secondary | ICD-10-CM | POA: Diagnosis not present

## 2018-12-21 DIAGNOSIS — G4700 Insomnia, unspecified: Secondary | ICD-10-CM | POA: Diagnosis not present

## 2018-12-21 DIAGNOSIS — K210 Gastro-esophageal reflux disease with esophagitis: Secondary | ICD-10-CM | POA: Diagnosis not present

## 2018-12-21 DIAGNOSIS — J432 Centrilobular emphysema: Secondary | ICD-10-CM | POA: Diagnosis not present

## 2018-12-21 DIAGNOSIS — Z7982 Long term (current) use of aspirin: Secondary | ICD-10-CM | POA: Diagnosis not present

## 2018-12-22 DIAGNOSIS — Z87891 Personal history of nicotine dependence: Secondary | ICD-10-CM | POA: Diagnosis not present

## 2018-12-22 DIAGNOSIS — G4700 Insomnia, unspecified: Secondary | ICD-10-CM | POA: Diagnosis not present

## 2018-12-22 DIAGNOSIS — K210 Gastro-esophageal reflux disease with esophagitis: Secondary | ICD-10-CM | POA: Diagnosis not present

## 2018-12-22 DIAGNOSIS — Z7951 Long term (current) use of inhaled steroids: Secondary | ICD-10-CM | POA: Diagnosis not present

## 2018-12-22 DIAGNOSIS — K3184 Gastroparesis: Secondary | ICD-10-CM | POA: Diagnosis not present

## 2018-12-22 DIAGNOSIS — F4322 Adjustment disorder with anxiety: Secondary | ICD-10-CM | POA: Diagnosis not present

## 2018-12-22 DIAGNOSIS — Z7982 Long term (current) use of aspirin: Secondary | ICD-10-CM | POA: Diagnosis not present

## 2018-12-22 DIAGNOSIS — N390 Urinary tract infection, site not specified: Secondary | ICD-10-CM | POA: Diagnosis not present

## 2018-12-22 DIAGNOSIS — M8008XD Age-related osteoporosis with current pathological fracture, vertebra(e), subsequent encounter for fracture with routine healing: Secondary | ICD-10-CM | POA: Diagnosis not present

## 2018-12-22 DIAGNOSIS — E871 Hypo-osmolality and hyponatremia: Secondary | ICD-10-CM | POA: Diagnosis not present

## 2018-12-22 DIAGNOSIS — S60421D Blister (nonthermal) of left index finger, subsequent encounter: Secondary | ICD-10-CM | POA: Diagnosis not present

## 2018-12-22 DIAGNOSIS — K5901 Slow transit constipation: Secondary | ICD-10-CM | POA: Diagnosis not present

## 2018-12-22 DIAGNOSIS — G4733 Obstructive sleep apnea (adult) (pediatric): Secondary | ICD-10-CM | POA: Diagnosis not present

## 2018-12-22 DIAGNOSIS — Z9181 History of falling: Secondary | ICD-10-CM | POA: Diagnosis not present

## 2018-12-22 DIAGNOSIS — N3281 Overactive bladder: Secondary | ICD-10-CM | POA: Diagnosis not present

## 2018-12-22 DIAGNOSIS — J432 Centrilobular emphysema: Secondary | ICD-10-CM | POA: Diagnosis not present

## 2018-12-22 DIAGNOSIS — Z85038 Personal history of other malignant neoplasm of large intestine: Secondary | ICD-10-CM | POA: Diagnosis not present

## 2018-12-27 DIAGNOSIS — E871 Hypo-osmolality and hyponatremia: Secondary | ICD-10-CM | POA: Diagnosis not present

## 2018-12-27 DIAGNOSIS — G4700 Insomnia, unspecified: Secondary | ICD-10-CM | POA: Diagnosis not present

## 2018-12-27 DIAGNOSIS — N390 Urinary tract infection, site not specified: Secondary | ICD-10-CM | POA: Diagnosis not present

## 2018-12-27 DIAGNOSIS — S60421D Blister (nonthermal) of left index finger, subsequent encounter: Secondary | ICD-10-CM | POA: Diagnosis not present

## 2018-12-27 DIAGNOSIS — K3184 Gastroparesis: Secondary | ICD-10-CM | POA: Diagnosis not present

## 2018-12-27 DIAGNOSIS — Z87891 Personal history of nicotine dependence: Secondary | ICD-10-CM | POA: Diagnosis not present

## 2018-12-27 DIAGNOSIS — K5901 Slow transit constipation: Secondary | ICD-10-CM | POA: Diagnosis not present

## 2018-12-27 DIAGNOSIS — N3281 Overactive bladder: Secondary | ICD-10-CM | POA: Diagnosis not present

## 2018-12-27 DIAGNOSIS — M8008XD Age-related osteoporosis with current pathological fracture, vertebra(e), subsequent encounter for fracture with routine healing: Secondary | ICD-10-CM | POA: Diagnosis not present

## 2018-12-27 DIAGNOSIS — J432 Centrilobular emphysema: Secondary | ICD-10-CM | POA: Diagnosis not present

## 2018-12-27 DIAGNOSIS — K210 Gastro-esophageal reflux disease with esophagitis: Secondary | ICD-10-CM | POA: Diagnosis not present

## 2018-12-27 DIAGNOSIS — F4322 Adjustment disorder with anxiety: Secondary | ICD-10-CM | POA: Diagnosis not present

## 2018-12-27 DIAGNOSIS — Z85038 Personal history of other malignant neoplasm of large intestine: Secondary | ICD-10-CM | POA: Diagnosis not present

## 2018-12-27 DIAGNOSIS — Z7951 Long term (current) use of inhaled steroids: Secondary | ICD-10-CM | POA: Diagnosis not present

## 2018-12-27 DIAGNOSIS — Z9181 History of falling: Secondary | ICD-10-CM | POA: Diagnosis not present

## 2018-12-27 DIAGNOSIS — Z7982 Long term (current) use of aspirin: Secondary | ICD-10-CM | POA: Diagnosis not present

## 2018-12-27 DIAGNOSIS — G4733 Obstructive sleep apnea (adult) (pediatric): Secondary | ICD-10-CM | POA: Diagnosis not present

## 2018-12-28 DIAGNOSIS — R05 Cough: Secondary | ICD-10-CM | POA: Diagnosis not present

## 2018-12-28 DIAGNOSIS — J449 Chronic obstructive pulmonary disease, unspecified: Secondary | ICD-10-CM | POA: Diagnosis not present

## 2018-12-30 DIAGNOSIS — G4733 Obstructive sleep apnea (adult) (pediatric): Secondary | ICD-10-CM | POA: Diagnosis not present

## 2019-01-02 DIAGNOSIS — S32121D Minimally displaced Zone II fracture of sacrum, subsequent encounter for fracture with routine healing: Secondary | ICD-10-CM | POA: Diagnosis not present

## 2019-01-02 DIAGNOSIS — M8448XD Pathological fracture, other site, subsequent encounter for fracture with routine healing: Secondary | ICD-10-CM | POA: Diagnosis not present

## 2019-01-04 DIAGNOSIS — G250 Essential tremor: Secondary | ICD-10-CM | POA: Diagnosis not present

## 2019-01-04 DIAGNOSIS — D485 Neoplasm of uncertain behavior of skin: Secondary | ICD-10-CM | POA: Diagnosis not present

## 2019-01-05 DIAGNOSIS — D1801 Hemangioma of skin and subcutaneous tissue: Secondary | ICD-10-CM | POA: Diagnosis not present

## 2019-01-06 DIAGNOSIS — K5901 Slow transit constipation: Secondary | ICD-10-CM | POA: Diagnosis not present

## 2019-01-06 DIAGNOSIS — K3184 Gastroparesis: Secondary | ICD-10-CM | POA: Diagnosis not present

## 2019-01-06 DIAGNOSIS — M8008XD Age-related osteoporosis with current pathological fracture, vertebra(e), subsequent encounter for fracture with routine healing: Secondary | ICD-10-CM | POA: Diagnosis not present

## 2019-01-06 DIAGNOSIS — Z7982 Long term (current) use of aspirin: Secondary | ICD-10-CM | POA: Diagnosis not present

## 2019-01-06 DIAGNOSIS — F4322 Adjustment disorder with anxiety: Secondary | ICD-10-CM | POA: Diagnosis not present

## 2019-01-06 DIAGNOSIS — Z85038 Personal history of other malignant neoplasm of large intestine: Secondary | ICD-10-CM | POA: Diagnosis not present

## 2019-01-06 DIAGNOSIS — G4733 Obstructive sleep apnea (adult) (pediatric): Secondary | ICD-10-CM | POA: Diagnosis not present

## 2019-01-06 DIAGNOSIS — Z9181 History of falling: Secondary | ICD-10-CM | POA: Diagnosis not present

## 2019-01-06 DIAGNOSIS — Z7951 Long term (current) use of inhaled steroids: Secondary | ICD-10-CM | POA: Diagnosis not present

## 2019-01-06 DIAGNOSIS — G4700 Insomnia, unspecified: Secondary | ICD-10-CM | POA: Diagnosis not present

## 2019-01-06 DIAGNOSIS — K210 Gastro-esophageal reflux disease with esophagitis: Secondary | ICD-10-CM | POA: Diagnosis not present

## 2019-01-06 DIAGNOSIS — J432 Centrilobular emphysema: Secondary | ICD-10-CM | POA: Diagnosis not present

## 2019-01-06 DIAGNOSIS — Z87891 Personal history of nicotine dependence: Secondary | ICD-10-CM | POA: Diagnosis not present

## 2019-01-06 DIAGNOSIS — N390 Urinary tract infection, site not specified: Secondary | ICD-10-CM | POA: Diagnosis not present

## 2019-01-06 DIAGNOSIS — E871 Hypo-osmolality and hyponatremia: Secondary | ICD-10-CM | POA: Diagnosis not present

## 2019-01-06 DIAGNOSIS — N3281 Overactive bladder: Secondary | ICD-10-CM | POA: Diagnosis not present

## 2019-01-06 DIAGNOSIS — S60421D Blister (nonthermal) of left index finger, subsequent encounter: Secondary | ICD-10-CM | POA: Diagnosis not present

## 2019-01-09 DIAGNOSIS — N3281 Overactive bladder: Secondary | ICD-10-CM | POA: Diagnosis not present

## 2019-01-09 DIAGNOSIS — Z9181 History of falling: Secondary | ICD-10-CM | POA: Diagnosis not present

## 2019-01-09 DIAGNOSIS — Z87891 Personal history of nicotine dependence: Secondary | ICD-10-CM | POA: Diagnosis not present

## 2019-01-09 DIAGNOSIS — Z7951 Long term (current) use of inhaled steroids: Secondary | ICD-10-CM | POA: Diagnosis not present

## 2019-01-09 DIAGNOSIS — G4733 Obstructive sleep apnea (adult) (pediatric): Secondary | ICD-10-CM | POA: Diagnosis not present

## 2019-01-09 DIAGNOSIS — Z85038 Personal history of other malignant neoplasm of large intestine: Secondary | ICD-10-CM | POA: Diagnosis not present

## 2019-01-09 DIAGNOSIS — E871 Hypo-osmolality and hyponatremia: Secondary | ICD-10-CM | POA: Diagnosis not present

## 2019-01-09 DIAGNOSIS — Z7982 Long term (current) use of aspirin: Secondary | ICD-10-CM | POA: Diagnosis not present

## 2019-01-09 DIAGNOSIS — J432 Centrilobular emphysema: Secondary | ICD-10-CM | POA: Diagnosis not present

## 2019-01-09 DIAGNOSIS — M8008XD Age-related osteoporosis with current pathological fracture, vertebra(e), subsequent encounter for fracture with routine healing: Secondary | ICD-10-CM | POA: Diagnosis not present

## 2019-01-09 DIAGNOSIS — F4322 Adjustment disorder with anxiety: Secondary | ICD-10-CM | POA: Diagnosis not present

## 2019-01-09 DIAGNOSIS — K210 Gastro-esophageal reflux disease with esophagitis: Secondary | ICD-10-CM | POA: Diagnosis not present

## 2019-01-09 DIAGNOSIS — G4700 Insomnia, unspecified: Secondary | ICD-10-CM | POA: Diagnosis not present

## 2019-01-09 DIAGNOSIS — K5901 Slow transit constipation: Secondary | ICD-10-CM | POA: Diagnosis not present

## 2019-01-09 DIAGNOSIS — S60421D Blister (nonthermal) of left index finger, subsequent encounter: Secondary | ICD-10-CM | POA: Diagnosis not present

## 2019-01-09 DIAGNOSIS — N390 Urinary tract infection, site not specified: Secondary | ICD-10-CM | POA: Diagnosis not present

## 2019-01-09 DIAGNOSIS — K3184 Gastroparesis: Secondary | ICD-10-CM | POA: Diagnosis not present

## 2019-01-18 ENCOUNTER — Other Ambulatory Visit: Payer: Self-pay

## 2019-01-18 ENCOUNTER — Ambulatory Visit: Payer: PPO | Attending: Orthopedic Surgery | Admitting: Physical Therapy

## 2019-01-18 DIAGNOSIS — R2689 Other abnormalities of gait and mobility: Secondary | ICD-10-CM | POA: Diagnosis not present

## 2019-01-18 DIAGNOSIS — M25571 Pain in right ankle and joints of right foot: Secondary | ICD-10-CM | POA: Insufficient documentation

## 2019-01-18 DIAGNOSIS — E559 Vitamin D deficiency, unspecified: Secondary | ICD-10-CM | POA: Diagnosis not present

## 2019-01-18 DIAGNOSIS — M6281 Muscle weakness (generalized): Secondary | ICD-10-CM | POA: Insufficient documentation

## 2019-01-18 DIAGNOSIS — M818 Other osteoporosis without current pathological fracture: Secondary | ICD-10-CM | POA: Diagnosis not present

## 2019-01-18 DIAGNOSIS — Z7189 Other specified counseling: Secondary | ICD-10-CM | POA: Diagnosis not present

## 2019-01-18 DIAGNOSIS — Z87311 Personal history of (healed) other pathological fracture: Secondary | ICD-10-CM | POA: Diagnosis not present

## 2019-01-18 NOTE — Therapy (Signed)
[address] [place], [place], [address] Phone: [phone]   Fax:  [phone]  Physical Therapy Evaluation  Patient Details  Name: Virginia Burns MRN: [id number] Date of Birth: 07-May-1946 Referring Provider (PT): Kary Kos   Encounter Date: 01/18/2019  PT End of Session - 01/18/19 1258    Visit Number  1    Number of Visits  16    Date for PT Re-Evaluation  03/15/19    PT Start Time  1300    PT Stop Time  1345    PT Time Calculation (min)  45 min    Equipment Utilized During Treatment  Gait belt    Activity Tolerance  Patient tolerated treatment well    Behavior During Therapy  [facility] for tasks assessed/performed       Past Medical History:  Diagnosis Date  . Adjustment disorder with anxiety 08/17/2013  . Allergic rhinitis   . Anxiety state 08/17/2013   Overview:  IMPRESSION: hx of anxiety, lost a friend and has friend in ICU, using the ativan prn. had refilled 07/27/13  . Asthma   . Benign neoplasm of ascending colon 12/07/2014  . Benign neoplasm of descending colon 12/07/2014  . Centrilobular emphysema ([place]) 12/07/2014  . Chronic respiratory failure ([place]) 03/04/2015  . Complication of anesthesia    Versed- hard time working - "dinging for days" - Colonoscopy  . COPD (chronic obstructive pulmonary disease) ([place])   . COPD (chronic obstructive pulmonary disease) with chronic bronchitis ([place]) 02/10/2013   Overnight pulse oximetry shows desaturations greater than 5 minutes at a time less than 88%.  Order signed for nocturnal O2 at 2 L/m February 21, 2016  Overview:  Overnight pulse oximetry shows desaturations greater than 5 minutes at a time less than 88%.  Order signed for nocturnal O2 at 2 L/m February 21, 2016 Overview:  Overnight pulse oximetry shows desaturations greater than 5 minutes at a time l  . 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 therapy 12/07/2014  . Diverticulosis of colon 10/12/2007   Qualifier: Diagnosis of   By: Nils Pyle CMA (AAMA), Mearl Latin    . Diverticulosis of colon (without mention of hemorrhage)   . Emphysema of lung ([place])   . Essential tremor 05/22/2013  . Family history of adverse reaction to anesthesia    Mother- hard to awaken and confusion.  . Family history of malignant neoplasm of gastrointestinal tract   . Gastroparesis   . GERD (gastroesophageal reflux disease)   . GERD with esophagitis 10/12/2007   Qualifier: Diagnosis of  By: Nils Pyle CMA (AAMA), Mearl Latin    Overview:  Overview:  Qualifier: Diagnosis of  By: Nils Pyle CMA Deborra Medina), Mearl Latin  Overview:  Overview:  Qualifier: Diagnosis of  By: Nils Pyle CMA (Deborra Medina), Mearl Latin   . History of tobacco abuse 09/18/2013  . Humerus distal fracture 01/24/2018  . Hx of colonic polyps   . Hypertension   . Hypertension, benign 07/19/2007   Qualifier: Diagnosis of  By: Garen Grams    Overview:  Overview:  STORY: The goal for blood pressure is less than 140/90.  If you are checking your blood pressure at home, please record it and bring it to your next office visit. Following the Dietary Approaches to Stop Hypertension (DASH) diet (3 servings of fruit and vegetables daily, whole grains, low sodium, low-fat proteins) and exercisin  . Insomnia 10/22/2012  . Multinodular goiter 06/01/2017  . 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 Obstructive sleep apnea (adult) (pediatric)  09/18/2013   Overview:  Last Assessment & Plan:  She has mild sleep apnea.  I have reviewed the recent sleep study results with the patient.  We discussed how sleep apnea can affect various health problems including risks for hypertension, cardiovascular disease, and diabetes.  We also discussed how sleep disruption can increase risks for accident, such as while driving.  Weight loss as a means of improving sl  . On home oxygen therapy    oxygen at night  . OSA (obstructive sleep apnea) 09/18/2013  . Osteoarthrosis 12/09/2012  . Osteopenia of multiple sites 10/12/2007   Qualifier: Diagnosis of  By:  Nils Pyle CMA ([place]), Mearl Latin    . Osteoporosis   . Other esophagitis   . Personal history of colonic polyps 10/07/2009   tubular adenoma  . Shortness of breath dyspnea   . Tachycardia, paroxysmal ([place]) 07/04/2018  . Urinary tract infection   . Vitamin B12 deficiency   . Vitamin D deficiency 12/09/2012   Overview:  Overview:  IMPRESSION: Appropiate labs done today. We will send the results and adjust treatment as needed. Bone density discussed. There has  been an improvement in her bone density comparing the one in 2009 from the one 2011. From osteoporotic she went to osteopenic. She did take Actonel 3 years ago for several years and stopped it due to GI side effects. We will continue mantaining f    Past Surgical History:  Procedure Laterality Date  . ABDOMINAL HYSTERECTOMY    . ABDOMINAL HYSTERECTOMY    . CATARACT EXTRACTION Right   . CHOLECYSTECTOMY    . COLONOSCOPY WITH PROPOFOL N/A 11/21/2014   Procedure: COLONOSCOPY WITH PROPOFOL;  Surgeon: Irene Shipper, MD;  Location: [facility];  Service: Endoscopy;  Laterality: N/A;  . FRACTURE SURGERY    . [place]  . LIGAMENT REPAIR Left 04/18/2013   Procedure: Triangular Fibrocartilage complex open repair;  Surgeon: Jolyn Nap, MD;  Location: [facility];  Service: Orthopedics;  Laterality: Left;  . OOPHORECTOMY    . OPEN REDUCTION INTERNAL FIXATION (ORIF) DISTAL RADIAL FRACTURE Left 04/18/2013   Procedure: LEFT OPEN REDUCTION INTERNAL FIXATION (ORIF) DISTAL RADIAL FRACTURE;  Surgeon: Jolyn Nap, MD;  Location: [facility];  Service: Orthopedics;  Laterality: Left;  . ORIF ELBOW FRACTURE Left 01/24/2018   Procedure: OPEN REDUCTION INTERNAL FIXATION (ORIF) DISTAL HUMERUS  ELBOW/OLECRANON OSTEOTOMY FRACTURE;  Surgeon: [name], MD;  Location: [facility];  Service: Orthopedics;  Laterality: Left;    There were no vitals filed for this visit.   Subjective Assessment - 01/18/19 1308     Subjective  Patient experienced spontaneous pelvic fracture from OP in May 2020. She had surgery 10/05/2018 and has 30+ screws in her pelvis. She also fell in September 2019 coming out of the bathroom and fractured her left elbow which also has screws in it. She presently amb using a RW and practices with SPC at home. She is WBAT.    Pertinent History  OP, Asthma, COPD, HTN, left elbow fx 2019, neuropathy bil feet, right ankle pain    Limitations  Sitting;Standing;Walking    How long can you sit comfortably?  15 min    How long can you stand comfortably?  10-15 min    How long can you walk comfortably?  10-15 min    Diagnostic tests  xrays    Patient Stated Goals  to get back to driving, walking and working    Currently in  Pain?  Yes    Pain Score  5     Pain Location  Pelvis    Pain Orientation  Right;Left    Pain Descriptors / Indicators  Dull    Pain Type  Acute pain    Pain Onset  More than a month ago    Pain Frequency  Intermittent    Aggravating Factors   prolonged sitting; hard chairs    Pain Relieving Factors  moving around    Effect of Pain on Daily Activities  limited with housework    Multiple Pain Sites  Yes    Pain Score  7    Pain Location  Ankle    Pain Orientation  Right    Pain Descriptors / Indicators  Tender;Aching    Pain Type  Acute pain    Pain Onset  1 to 4 weeks ago    Aggravating Factors   unsure    Pain Relieving Factors  proppiing feet         [facility] PT Assessment - 01/18/19 0001      Assessment   Medical Diagnosis  broken pelvis    Referring Provider (PT)  Kary Kos    Onset Date/Surgical Date  10/20/18    Hand Dominance  Right    Next MD Visit  Apr 06, 2019    Prior Therapy  no      Precautions   Precautions  Fall      Restrictions   Weight Bearing Restrictions  Yes    Other Position/Activity Restrictions  --   WBAT     Balance Screen   Has the patient fallen in the past 6 months  No    Has the patient had a decrease in activity level  because of a fear of falling?   No    Is the patient reluctant to leave their home because of a fear of falling?   No      Home Environment   Living Environment  Private residence    Living Arrangements  Children    Available Help at Discharge  Family    Type of [place] to enter    Entrance Stairs-Number of Steps  1   4 steps at [employer]  Can reach both    [place]  One level    [place] - 2 wheels;Cane - single point;Shower seat;Grab bars - tub/shower;Wheelchair - manual      Prior Function   Level of Independence  Independent    Vocation  Full time employment    Education officer, environmental      ROM / Strength   AROM / PROM / Strength  AROM;Strength;PROM      AROM   AROM Assessment Site  Hip    Right/Left Hip  Right;Left    Right Hip Flexion  95    Left Hip Flexion  81      PROM   Overall PROM Comments  RT hip flex 100    PROM Assessment Site  Hip    Right/Left Hip  Right;Left    Left Hip Flexion  88      Strength   Overall Strength Comments  Bil hip flex 4+/5, Rt knee ext 4/5, else bil knee 5/5,  Seated hip ABD/ADD 4+/5 bil, Right ankle 4-/5, able to perform partial bridge      Flexibility   Soft Tissue Assessment /Muscle Length  yes    Hamstrings  bil tightness and gastrocs      Palpation   Palpation comment  bil gluteals Rt>Lt      Transfers   Five time sit to stand comments   22 sec from mat table      Ambulation/Gait   Ambulation/Gait  Yes    Ambulation/Gait Assistance  6: Modified independent (Device/Increase time)    Ambulation Distance (Feet)  120 Feet    Assistive device  Rolling walker;Straight cane    Gait Pattern  Step-through pattern;Decreased step length - right;Decreased step length - left;Decreased stride length    Ambulation Surface  Level                Objective measurements completed on examination: See above findings.              PT Education -  01/18/19 1625    Education Details  HEP    Person(s) Educated  Patient    Methods  Explanation;Demonstration;Handout    Comprehension  Verbalized understanding;Returned demonstration       PT Short Term Goals - 01/18/19 1632      PT SHORT TERM GOAL #1   Title  independent with initial HEP    Time  2    Period  Weeks    Status  New    Target Date  02/01/19        PT Long Term Goals - 01/18/19 1633      PT LONG TERM GOAL #1   Title  Independent with HEP for strengthening and flexibility to increase function.    Time  8    Period  Weeks    Status  New    Target Date  03/15/19      PT LONG TERM GOAL #2   Title  Patient to demo 5/5 strength in BLE  including right ankle to improve ADLs and increase function.    Baseline  --    Time  8    Period  Weeks    Status  New      PT LONG TERM GOAL #3   Title  Patient able to climb at least 4 stairs with a reciprocal gait pattern    Time  8    Period  Weeks    Status  New      PT LONG TERM GOAL #4   Title  Patient able to safely walk community distances level/unlevel surfaces with LRAD.    Time  8    Period  Weeks    Status  New      PT LONG TERM GOAL #5   Title  Pt to report decreased pain in right ankle to 2/10 or less with ambulation    Time  8    Period  Weeks    Status  New             Plan - 01/18/19 1348    Clinical Impression Statement  Pt presents s/p pelvic fx and also c/o of right ankle pain. Patient experienced spontaneous pelvic fracture from OP in May 2020. She had surgery 10/05/2018 and has 30+ screws in her pelvis. Her ankle pain began a few months ago and she recently noticed swelling. She has decreased strength and flexibility in BLE limiting ROM and affecting gait and balance. Patient will benefit from PT to address these deficits to increase function and decrease fall risk.    Personal Factors and Comorbidities  Age;Comorbidity 3+  Comorbidities  OP, Asthma, COPD, HTN, left elbow fx 2019,  neuropathy bil feet, right ankle pain    Examination-Activity Limitations  Sit;Locomotion Level;Stairs;Stand    Examination-Participation Restrictions  Driving;Community Activity    Stability/Clinical Decision Making  Evolving/Moderate complexity    Clinical Decision Making  Low    Rehab Potential  Good    PT Frequency  2x / week    PT Duration  8 weeks    PT Treatment/Interventions  ADLs/Self Care Home Management;Cryotherapy;Electrical Stimulation;Moist Heat;Stair training;Gait training;Therapeutic exercise;Balance training;Neuromuscular re-education;Patient/family education;Manual techniques;Passive range of motion;Dry needling;Iontophoresis 4mg /ml Dexamethasone    PT Next Visit Plan  begin strengthening, gait and balance; right ankle strength and gastroc stretch    PT Home Exercise Plan  sit to stand, bridge       Patient will benefit from skilled therapeutic intervention in order to improve the following deficits and impairments:  Abnormal gait, Pain, Decreased range of motion, Decreased strength, Decreased balance, Impaired flexibility, Increased edema  Visit Diagnosis: Other abnormalities of gait and mobility - Plan: PT plan of care cert/re-cert  Muscle weakness (generalized) - Plan: PT plan of care cert/re-cert  Pain in right ankle and joints of right foot - Plan: PT plan of care cert/re-cert     Problem List Patient Active Problem List   Diagnosis Date Noted  . Palpitations 07/14/2018  . Aortic ejection murmur 07/14/2018  . Tachycardia, paroxysmal ([place]) 07/04/2018  . 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 Humerus distal fracture 01/24/2018  . Multinodular goiter 06/01/2017  . Chronic respiratory failure ([place]) 03/04/2015  . Benign neoplasm of ascending colon 12/07/2014  . Benign neoplasm of descending colon 12/07/2014  . Centrilobular emphysema ([place]) 12/07/2014  . 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 therapy 12/07/2014  . Hx of colonic polyps   . OSA (obstructive  sleep apnea) 09/18/2013  . History of tobacco abuse 09/18/2013  . Obstructive sleep apnea (adult) (pediatric) 09/18/2013  . Adjustment disorder with anxiety 08/17/2013  . Anxiety state 08/17/2013  . Essential tremor 05/22/2013  . COPD (chronic obstructive pulmonary disease) ([place]) 02/10/2013  . COPD (chronic obstructive pulmonary disease) with chronic bronchitis ([place]) 02/10/2013  . Osteoarthrosis 12/09/2012  . Vitamin D deficiency 12/09/2012  . Insomnia 10/22/2012  . GERD with esophagitis 10/12/2007  . Diverticulosis of colon 10/12/2007  . Osteopenia of multiple sites 10/12/2007  . Hypertension, benign 07/19/2007  . Gastroparesis 07/19/2007    Madelyn Flavors PT 01/18/2019, 4:40 PM  [address] [place], [place], [address] Phone: [phone]   Fax:  [phone]  Name: Virginia Burns MRN: [id number] Date of Birth: Feb 03, 1946

## 2019-01-23 DIAGNOSIS — K5900 Constipation, unspecified: Secondary | ICD-10-CM | POA: Diagnosis not present

## 2019-01-23 DIAGNOSIS — S199XXA Unspecified injury of neck, initial encounter: Secondary | ICD-10-CM | POA: Diagnosis not present

## 2019-01-23 DIAGNOSIS — Z20828 Contact with and (suspected) exposure to other viral communicable diseases: Secondary | ICD-10-CM | POA: Diagnosis not present

## 2019-01-23 DIAGNOSIS — Z9181 History of falling: Secondary | ICD-10-CM | POA: Diagnosis not present

## 2019-01-23 DIAGNOSIS — E871 Hypo-osmolality and hyponatremia: Secondary | ICD-10-CM | POA: Diagnosis not present

## 2019-01-23 DIAGNOSIS — J432 Centrilobular emphysema: Secondary | ICD-10-CM | POA: Diagnosis not present

## 2019-01-23 DIAGNOSIS — D649 Anemia, unspecified: Secondary | ICD-10-CM | POA: Diagnosis not present

## 2019-01-23 DIAGNOSIS — S52501A Unspecified fracture of the lower end of right radius, initial encounter for closed fracture: Secondary | ICD-10-CM | POA: Diagnosis not present

## 2019-01-23 DIAGNOSIS — N3281 Overactive bladder: Secondary | ICD-10-CM | POA: Diagnosis not present

## 2019-01-23 DIAGNOSIS — S61501D Unspecified open wound of right wrist, subsequent encounter: Secondary | ICD-10-CM | POA: Diagnosis not present

## 2019-01-23 DIAGNOSIS — E878 Other disorders of electrolyte and fluid balance, not elsewhere classified: Secondary | ICD-10-CM | POA: Diagnosis not present

## 2019-01-23 DIAGNOSIS — M6281 Muscle weakness (generalized): Secondary | ICD-10-CM | POA: Diagnosis not present

## 2019-01-23 DIAGNOSIS — R2681 Unsteadiness on feet: Secondary | ICD-10-CM | POA: Diagnosis not present

## 2019-01-23 DIAGNOSIS — K3184 Gastroparesis: Secondary | ICD-10-CM | POA: Diagnosis not present

## 2019-01-23 DIAGNOSIS — M810 Age-related osteoporosis without current pathological fracture: Secondary | ICD-10-CM | POA: Diagnosis not present

## 2019-01-23 DIAGNOSIS — Y998 Other external cause status: Secondary | ICD-10-CM | POA: Diagnosis not present

## 2019-01-23 DIAGNOSIS — Z01818 Encounter for other preprocedural examination: Secondary | ICD-10-CM | POA: Diagnosis not present

## 2019-01-23 DIAGNOSIS — S59901A Unspecified injury of right elbow, initial encounter: Secondary | ICD-10-CM | POA: Diagnosis not present

## 2019-01-23 DIAGNOSIS — S52501B Unspecified fracture of the lower end of right radius, initial encounter for open fracture type I or II: Secondary | ICD-10-CM | POA: Diagnosis not present

## 2019-01-23 DIAGNOSIS — W010XXA Fall on same level from slipping, tripping and stumbling without subsequent striking against object, initial encounter: Secondary | ICD-10-CM | POA: Diagnosis not present

## 2019-01-23 DIAGNOSIS — S52571A Other intraarticular fracture of lower end of right radius, initial encounter for closed fracture: Secondary | ICD-10-CM | POA: Diagnosis not present

## 2019-01-23 DIAGNOSIS — Z8781 Personal history of (healed) traumatic fracture: Secondary | ICD-10-CM | POA: Diagnosis not present

## 2019-01-23 DIAGNOSIS — J449 Chronic obstructive pulmonary disease, unspecified: Secondary | ICD-10-CM | POA: Diagnosis not present

## 2019-01-23 DIAGNOSIS — Z79899 Other long term (current) drug therapy: Secondary | ICD-10-CM | POA: Diagnosis not present

## 2019-01-23 DIAGNOSIS — G4733 Obstructive sleep apnea (adult) (pediatric): Secondary | ICD-10-CM | POA: Diagnosis not present

## 2019-01-23 DIAGNOSIS — G629 Polyneuropathy, unspecified: Secondary | ICD-10-CM | POA: Diagnosis not present

## 2019-01-23 DIAGNOSIS — E559 Vitamin D deficiency, unspecified: Secondary | ICD-10-CM | POA: Diagnosis not present

## 2019-01-23 DIAGNOSIS — E042 Nontoxic multinodular goiter: Secondary | ICD-10-CM | POA: Diagnosis not present

## 2019-01-23 DIAGNOSIS — F4322 Adjustment disorder with anxiety: Secondary | ICD-10-CM | POA: Diagnosis not present

## 2019-01-23 DIAGNOSIS — J9610 Chronic respiratory failure, unspecified whether with hypoxia or hypercapnia: Secondary | ICD-10-CM | POA: Diagnosis not present

## 2019-01-23 DIAGNOSIS — Z01812 Encounter for preprocedural laboratory examination: Secondary | ICD-10-CM | POA: Diagnosis not present

## 2019-01-23 DIAGNOSIS — K219 Gastro-esophageal reflux disease without esophagitis: Secondary | ICD-10-CM | POA: Diagnosis not present

## 2019-01-23 DIAGNOSIS — Z87891 Personal history of nicotine dependence: Secondary | ICD-10-CM | POA: Diagnosis not present

## 2019-01-23 DIAGNOSIS — M8589 Other specified disorders of bone density and structure, multiple sites: Secondary | ICD-10-CM | POA: Diagnosis not present

## 2019-01-23 DIAGNOSIS — S52571D Other intraarticular fracture of lower end of right radius, subsequent encounter for closed fracture with routine healing: Secondary | ICD-10-CM | POA: Diagnosis not present

## 2019-01-23 DIAGNOSIS — K210 Gastro-esophageal reflux disease with esophagitis: Secondary | ICD-10-CM | POA: Diagnosis not present

## 2019-01-23 DIAGNOSIS — R2689 Other abnormalities of gait and mobility: Secondary | ICD-10-CM | POA: Diagnosis not present

## 2019-01-23 DIAGNOSIS — Z23 Encounter for immunization: Secondary | ICD-10-CM | POA: Diagnosis not present

## 2019-01-23 DIAGNOSIS — I10 Essential (primary) hypertension: Secondary | ICD-10-CM | POA: Diagnosis not present

## 2019-01-23 DIAGNOSIS — Z9981 Dependence on supplemental oxygen: Secondary | ICD-10-CM | POA: Diagnosis not present

## 2019-01-23 DIAGNOSIS — Z90710 Acquired absence of both cervix and uterus: Secondary | ICD-10-CM | POA: Diagnosis not present

## 2019-01-23 DIAGNOSIS — Z01810 Encounter for preprocedural cardiovascular examination: Secondary | ICD-10-CM | POA: Diagnosis not present

## 2019-01-23 DIAGNOSIS — S0990XA Unspecified injury of head, initial encounter: Secondary | ICD-10-CM | POA: Diagnosis not present

## 2019-01-23 DIAGNOSIS — S52571B Other intraarticular fracture of lower end of right radius, initial encounter for open fracture type I or II: Secondary | ICD-10-CM | POA: Diagnosis not present

## 2019-01-23 DIAGNOSIS — G8918 Other acute postprocedural pain: Secondary | ICD-10-CM | POA: Diagnosis not present

## 2019-01-23 DIAGNOSIS — S61501A Unspecified open wound of right wrist, initial encounter: Secondary | ICD-10-CM | POA: Diagnosis not present

## 2019-01-26 ENCOUNTER — Ambulatory Visit: Payer: PPO

## 2019-01-26 DIAGNOSIS — K5900 Constipation, unspecified: Secondary | ICD-10-CM | POA: Diagnosis not present

## 2019-01-26 DIAGNOSIS — S61501D Unspecified open wound of right wrist, subsequent encounter: Secondary | ICD-10-CM | POA: Diagnosis not present

## 2019-01-26 DIAGNOSIS — J449 Chronic obstructive pulmonary disease, unspecified: Secondary | ICD-10-CM | POA: Diagnosis not present

## 2019-01-26 DIAGNOSIS — I959 Hypotension, unspecified: Secondary | ICD-10-CM | POA: Diagnosis not present

## 2019-01-26 DIAGNOSIS — S52601D Unspecified fracture of lower end of right ulna, subsequent encounter for closed fracture with routine healing: Secondary | ICD-10-CM | POA: Diagnosis not present

## 2019-01-26 DIAGNOSIS — D649 Anemia, unspecified: Secondary | ICD-10-CM | POA: Diagnosis not present

## 2019-01-26 DIAGNOSIS — M25421 Effusion, right elbow: Secondary | ICD-10-CM | POA: Diagnosis not present

## 2019-01-26 DIAGNOSIS — M25431 Effusion, right wrist: Secondary | ICD-10-CM | POA: Diagnosis not present

## 2019-01-26 DIAGNOSIS — G4733 Obstructive sleep apnea (adult) (pediatric): Secondary | ICD-10-CM | POA: Diagnosis not present

## 2019-01-26 DIAGNOSIS — M79631 Pain in right forearm: Secondary | ICD-10-CM | POA: Diagnosis not present

## 2019-01-26 DIAGNOSIS — S5291XD Unspecified fracture of right forearm, subsequent encounter for closed fracture with routine healing: Secondary | ICD-10-CM | POA: Diagnosis not present

## 2019-01-26 DIAGNOSIS — I10 Essential (primary) hypertension: Secondary | ICD-10-CM | POA: Diagnosis not present

## 2019-01-26 DIAGNOSIS — S92302A Fracture of unspecified metatarsal bone(s), left foot, initial encounter for closed fracture: Secondary | ICD-10-CM | POA: Diagnosis not present

## 2019-01-26 DIAGNOSIS — E871 Hypo-osmolality and hyponatremia: Secondary | ICD-10-CM | POA: Diagnosis not present

## 2019-01-26 DIAGNOSIS — J432 Centrilobular emphysema: Secondary | ICD-10-CM | POA: Diagnosis not present

## 2019-01-26 DIAGNOSIS — Z87891 Personal history of nicotine dependence: Secondary | ICD-10-CM | POA: Diagnosis not present

## 2019-01-26 DIAGNOSIS — R0902 Hypoxemia: Secondary | ICD-10-CM | POA: Diagnosis not present

## 2019-01-26 DIAGNOSIS — M6281 Muscle weakness (generalized): Secondary | ICD-10-CM | POA: Diagnosis not present

## 2019-01-26 DIAGNOSIS — E559 Vitamin D deficiency, unspecified: Secondary | ICD-10-CM | POA: Diagnosis not present

## 2019-01-26 DIAGNOSIS — Z4789 Encounter for other orthopedic aftercare: Secondary | ICD-10-CM | POA: Diagnosis not present

## 2019-01-26 DIAGNOSIS — F4322 Adjustment disorder with anxiety: Secondary | ICD-10-CM | POA: Diagnosis not present

## 2019-01-26 DIAGNOSIS — S52571D Other intraarticular fracture of lower end of right radius, subsequent encounter for closed fracture with routine healing: Secondary | ICD-10-CM | POA: Diagnosis not present

## 2019-01-26 DIAGNOSIS — S52501D Unspecified fracture of the lower end of right radius, subsequent encounter for closed fracture with routine healing: Secondary | ICD-10-CM | POA: Diagnosis not present

## 2019-01-26 DIAGNOSIS — Z8781 Personal history of (healed) traumatic fracture: Secondary | ICD-10-CM | POA: Diagnosis not present

## 2019-01-26 DIAGNOSIS — S52501B Unspecified fracture of the lower end of right radius, initial encounter for open fracture type I or II: Secondary | ICD-10-CM | POA: Diagnosis not present

## 2019-01-26 DIAGNOSIS — Z9181 History of falling: Secondary | ICD-10-CM | POA: Diagnosis not present

## 2019-01-26 DIAGNOSIS — J9610 Chronic respiratory failure, unspecified whether with hypoxia or hypercapnia: Secondary | ICD-10-CM | POA: Diagnosis not present

## 2019-01-26 DIAGNOSIS — R2689 Other abnormalities of gait and mobility: Secondary | ICD-10-CM | POA: Diagnosis not present

## 2019-01-26 DIAGNOSIS — M25531 Pain in right wrist: Secondary | ICD-10-CM | POA: Diagnosis not present

## 2019-01-26 DIAGNOSIS — R2681 Unsteadiness on feet: Secondary | ICD-10-CM | POA: Diagnosis not present

## 2019-01-26 DIAGNOSIS — R609 Edema, unspecified: Secondary | ICD-10-CM | POA: Diagnosis not present

## 2019-01-26 DIAGNOSIS — G629 Polyneuropathy, unspecified: Secondary | ICD-10-CM | POA: Diagnosis not present

## 2019-01-26 DIAGNOSIS — Z9981 Dependence on supplemental oxygen: Secondary | ICD-10-CM | POA: Diagnosis not present

## 2019-01-26 DIAGNOSIS — M810 Age-related osteoporosis without current pathological fracture: Secondary | ICD-10-CM | POA: Diagnosis not present

## 2019-01-27 DIAGNOSIS — I10 Essential (primary) hypertension: Secondary | ICD-10-CM | POA: Diagnosis not present

## 2019-01-27 DIAGNOSIS — E871 Hypo-osmolality and hyponatremia: Secondary | ICD-10-CM | POA: Diagnosis not present

## 2019-01-27 DIAGNOSIS — J449 Chronic obstructive pulmonary disease, unspecified: Secondary | ICD-10-CM | POA: Diagnosis not present

## 2019-01-27 DIAGNOSIS — S52501D Unspecified fracture of the lower end of right radius, subsequent encounter for closed fracture with routine healing: Secondary | ICD-10-CM | POA: Diagnosis not present

## 2019-01-30 ENCOUNTER — Ambulatory Visit: Payer: PPO

## 2019-02-01 ENCOUNTER — Ambulatory Visit: Payer: PPO

## 2019-02-10 DIAGNOSIS — S52501D Unspecified fracture of the lower end of right radius, subsequent encounter for closed fracture with routine healing: Secondary | ICD-10-CM | POA: Diagnosis not present

## 2019-02-10 DIAGNOSIS — S52601D Unspecified fracture of lower end of right ulna, subsequent encounter for closed fracture with routine healing: Secondary | ICD-10-CM | POA: Diagnosis not present

## 2019-02-12 DIAGNOSIS — M79631 Pain in right forearm: Secondary | ICD-10-CM | POA: Diagnosis not present

## 2019-02-12 DIAGNOSIS — M25431 Effusion, right wrist: Secondary | ICD-10-CM | POA: Diagnosis not present

## 2019-02-12 DIAGNOSIS — Z4789 Encounter for other orthopedic aftercare: Secondary | ICD-10-CM | POA: Diagnosis not present

## 2019-02-12 DIAGNOSIS — M25421 Effusion, right elbow: Secondary | ICD-10-CM | POA: Diagnosis not present

## 2019-02-12 DIAGNOSIS — S5291XD Unspecified fracture of right forearm, subsequent encounter for closed fracture with routine healing: Secondary | ICD-10-CM | POA: Diagnosis not present

## 2019-02-23 DIAGNOSIS — K219 Gastro-esophageal reflux disease without esophagitis: Secondary | ICD-10-CM | POA: Diagnosis not present

## 2019-02-23 DIAGNOSIS — M80031D Age-related osteoporosis with current pathological fracture, right forearm, subsequent encounter for fracture with routine healing: Secondary | ICD-10-CM | POA: Diagnosis not present

## 2019-02-23 DIAGNOSIS — K5730 Diverticulosis of large intestine without perforation or abscess without bleeding: Secondary | ICD-10-CM | POA: Diagnosis not present

## 2019-02-23 DIAGNOSIS — G4733 Obstructive sleep apnea (adult) (pediatric): Secondary | ICD-10-CM | POA: Diagnosis not present

## 2019-02-23 DIAGNOSIS — Z9181 History of falling: Secondary | ICD-10-CM | POA: Diagnosis not present

## 2019-02-23 DIAGNOSIS — G250 Essential tremor: Secondary | ICD-10-CM | POA: Diagnosis not present

## 2019-02-23 DIAGNOSIS — Z87891 Personal history of nicotine dependence: Secondary | ICD-10-CM | POA: Diagnosis not present

## 2019-02-23 DIAGNOSIS — I10 Essential (primary) hypertension: Secondary | ICD-10-CM | POA: Diagnosis not present

## 2019-02-23 DIAGNOSIS — G4700 Insomnia, unspecified: Secondary | ICD-10-CM | POA: Diagnosis not present

## 2019-02-23 DIAGNOSIS — M1990 Unspecified osteoarthritis, unspecified site: Secondary | ICD-10-CM | POA: Diagnosis not present

## 2019-02-23 DIAGNOSIS — J432 Centrilobular emphysema: Secondary | ICD-10-CM | POA: Diagnosis not present

## 2019-02-23 DIAGNOSIS — J9610 Chronic respiratory failure, unspecified whether with hypoxia or hypercapnia: Secondary | ICD-10-CM | POA: Diagnosis not present

## 2019-03-01 DIAGNOSIS — M1990 Unspecified osteoarthritis, unspecified site: Secondary | ICD-10-CM | POA: Diagnosis not present

## 2019-03-01 DIAGNOSIS — Z9181 History of falling: Secondary | ICD-10-CM | POA: Diagnosis not present

## 2019-03-01 DIAGNOSIS — J432 Centrilobular emphysema: Secondary | ICD-10-CM | POA: Diagnosis not present

## 2019-03-01 DIAGNOSIS — G4700 Insomnia, unspecified: Secondary | ICD-10-CM | POA: Diagnosis not present

## 2019-03-01 DIAGNOSIS — G4733 Obstructive sleep apnea (adult) (pediatric): Secondary | ICD-10-CM | POA: Diagnosis not present

## 2019-03-01 DIAGNOSIS — K5730 Diverticulosis of large intestine without perforation or abscess without bleeding: Secondary | ICD-10-CM | POA: Diagnosis not present

## 2019-03-01 DIAGNOSIS — Z87891 Personal history of nicotine dependence: Secondary | ICD-10-CM | POA: Diagnosis not present

## 2019-03-01 DIAGNOSIS — G250 Essential tremor: Secondary | ICD-10-CM | POA: Diagnosis not present

## 2019-03-01 DIAGNOSIS — I10 Essential (primary) hypertension: Secondary | ICD-10-CM | POA: Diagnosis not present

## 2019-03-01 DIAGNOSIS — M80031D Age-related osteoporosis with current pathological fracture, right forearm, subsequent encounter for fracture with routine healing: Secondary | ICD-10-CM | POA: Diagnosis not present

## 2019-03-01 DIAGNOSIS — K219 Gastro-esophageal reflux disease without esophagitis: Secondary | ICD-10-CM | POA: Diagnosis not present

## 2019-03-01 DIAGNOSIS — J9610 Chronic respiratory failure, unspecified whether with hypoxia or hypercapnia: Secondary | ICD-10-CM | POA: Diagnosis not present

## 2019-03-04 DIAGNOSIS — J432 Centrilobular emphysema: Secondary | ICD-10-CM | POA: Diagnosis not present

## 2019-03-04 DIAGNOSIS — J449 Chronic obstructive pulmonary disease, unspecified: Secondary | ICD-10-CM | POA: Diagnosis not present

## 2019-03-04 DIAGNOSIS — R0902 Hypoxemia: Secondary | ICD-10-CM | POA: Diagnosis not present

## 2019-03-08 DIAGNOSIS — G4733 Obstructive sleep apnea (adult) (pediatric): Secondary | ICD-10-CM | POA: Diagnosis not present

## 2019-03-08 DIAGNOSIS — J432 Centrilobular emphysema: Secondary | ICD-10-CM | POA: Diagnosis not present

## 2019-03-08 DIAGNOSIS — Z87891 Personal history of nicotine dependence: Secondary | ICD-10-CM | POA: Diagnosis not present

## 2019-03-08 DIAGNOSIS — M1990 Unspecified osteoarthritis, unspecified site: Secondary | ICD-10-CM | POA: Diagnosis not present

## 2019-03-08 DIAGNOSIS — K5730 Diverticulosis of large intestine without perforation or abscess without bleeding: Secondary | ICD-10-CM | POA: Diagnosis not present

## 2019-03-08 DIAGNOSIS — K219 Gastro-esophageal reflux disease without esophagitis: Secondary | ICD-10-CM | POA: Diagnosis not present

## 2019-03-08 DIAGNOSIS — J9610 Chronic respiratory failure, unspecified whether with hypoxia or hypercapnia: Secondary | ICD-10-CM | POA: Diagnosis not present

## 2019-03-08 DIAGNOSIS — G250 Essential tremor: Secondary | ICD-10-CM | POA: Diagnosis not present

## 2019-03-08 DIAGNOSIS — Z9181 History of falling: Secondary | ICD-10-CM | POA: Diagnosis not present

## 2019-03-08 DIAGNOSIS — M80031D Age-related osteoporosis with current pathological fracture, right forearm, subsequent encounter for fracture with routine healing: Secondary | ICD-10-CM | POA: Diagnosis not present

## 2019-03-08 DIAGNOSIS — I10 Essential (primary) hypertension: Secondary | ICD-10-CM | POA: Diagnosis not present

## 2019-03-08 DIAGNOSIS — G4700 Insomnia, unspecified: Secondary | ICD-10-CM | POA: Diagnosis not present

## 2019-03-10 DIAGNOSIS — S52571D Other intraarticular fracture of lower end of right radius, subsequent encounter for closed fracture with routine healing: Secondary | ICD-10-CM | POA: Diagnosis not present

## 2019-03-14 DIAGNOSIS — J449 Chronic obstructive pulmonary disease, unspecified: Secondary | ICD-10-CM | POA: Diagnosis not present

## 2019-03-14 DIAGNOSIS — J432 Centrilobular emphysema: Secondary | ICD-10-CM | POA: Diagnosis not present

## 2019-03-14 DIAGNOSIS — R0902 Hypoxemia: Secondary | ICD-10-CM | POA: Diagnosis not present

## 2019-03-17 DIAGNOSIS — G4700 Insomnia, unspecified: Secondary | ICD-10-CM | POA: Diagnosis not present

## 2019-03-17 DIAGNOSIS — I739 Peripheral vascular disease, unspecified: Secondary | ICD-10-CM | POA: Diagnosis not present

## 2019-03-17 DIAGNOSIS — B351 Tinea unguium: Secondary | ICD-10-CM | POA: Diagnosis not present

## 2019-03-17 DIAGNOSIS — G250 Essential tremor: Secondary | ICD-10-CM | POA: Diagnosis not present

## 2019-03-17 DIAGNOSIS — K219 Gastro-esophageal reflux disease without esophagitis: Secondary | ICD-10-CM | POA: Diagnosis not present

## 2019-03-17 DIAGNOSIS — K5730 Diverticulosis of large intestine without perforation or abscess without bleeding: Secondary | ICD-10-CM | POA: Diagnosis not present

## 2019-03-17 DIAGNOSIS — G4733 Obstructive sleep apnea (adult) (pediatric): Secondary | ICD-10-CM | POA: Diagnosis not present

## 2019-03-17 DIAGNOSIS — Z87891 Personal history of nicotine dependence: Secondary | ICD-10-CM | POA: Diagnosis not present

## 2019-03-17 DIAGNOSIS — M65871 Other synovitis and tenosynovitis, right ankle and foot: Secondary | ICD-10-CM | POA: Diagnosis not present

## 2019-03-17 DIAGNOSIS — I10 Essential (primary) hypertension: Secondary | ICD-10-CM | POA: Diagnosis not present

## 2019-03-17 DIAGNOSIS — Z9181 History of falling: Secondary | ICD-10-CM | POA: Diagnosis not present

## 2019-03-17 DIAGNOSIS — M1990 Unspecified osteoarthritis, unspecified site: Secondary | ICD-10-CM | POA: Diagnosis not present

## 2019-03-17 DIAGNOSIS — J432 Centrilobular emphysema: Secondary | ICD-10-CM | POA: Diagnosis not present

## 2019-03-17 DIAGNOSIS — J9610 Chronic respiratory failure, unspecified whether with hypoxia or hypercapnia: Secondary | ICD-10-CM | POA: Diagnosis not present

## 2019-03-17 DIAGNOSIS — M80031D Age-related osteoporosis with current pathological fracture, right forearm, subsequent encounter for fracture with routine healing: Secondary | ICD-10-CM | POA: Diagnosis not present

## 2019-03-20 DIAGNOSIS — I10 Essential (primary) hypertension: Secondary | ICD-10-CM | POA: Diagnosis not present

## 2019-03-20 DIAGNOSIS — Z87891 Personal history of nicotine dependence: Secondary | ICD-10-CM | POA: Diagnosis not present

## 2019-03-20 DIAGNOSIS — K5730 Diverticulosis of large intestine without perforation or abscess without bleeding: Secondary | ICD-10-CM | POA: Diagnosis not present

## 2019-03-20 DIAGNOSIS — M1990 Unspecified osteoarthritis, unspecified site: Secondary | ICD-10-CM | POA: Diagnosis not present

## 2019-03-20 DIAGNOSIS — G4700 Insomnia, unspecified: Secondary | ICD-10-CM | POA: Diagnosis not present

## 2019-03-20 DIAGNOSIS — G250 Essential tremor: Secondary | ICD-10-CM | POA: Diagnosis not present

## 2019-03-20 DIAGNOSIS — M80031D Age-related osteoporosis with current pathological fracture, right forearm, subsequent encounter for fracture with routine healing: Secondary | ICD-10-CM | POA: Diagnosis not present

## 2019-03-20 DIAGNOSIS — Z9181 History of falling: Secondary | ICD-10-CM | POA: Diagnosis not present

## 2019-03-20 DIAGNOSIS — J9610 Chronic respiratory failure, unspecified whether with hypoxia or hypercapnia: Secondary | ICD-10-CM | POA: Diagnosis not present

## 2019-03-20 DIAGNOSIS — K219 Gastro-esophageal reflux disease without esophagitis: Secondary | ICD-10-CM | POA: Diagnosis not present

## 2019-03-20 DIAGNOSIS — J432 Centrilobular emphysema: Secondary | ICD-10-CM | POA: Diagnosis not present

## 2019-03-20 DIAGNOSIS — S52571D Other intraarticular fracture of lower end of right radius, subsequent encounter for closed fracture with routine healing: Secondary | ICD-10-CM | POA: Diagnosis not present

## 2019-03-20 DIAGNOSIS — G4733 Obstructive sleep apnea (adult) (pediatric): Secondary | ICD-10-CM | POA: Diagnosis not present

## 2019-03-22 DIAGNOSIS — J9610 Chronic respiratory failure, unspecified whether with hypoxia or hypercapnia: Secondary | ICD-10-CM | POA: Diagnosis not present

## 2019-03-22 DIAGNOSIS — M80031D Age-related osteoporosis with current pathological fracture, right forearm, subsequent encounter for fracture with routine healing: Secondary | ICD-10-CM | POA: Diagnosis not present

## 2019-03-22 DIAGNOSIS — M1990 Unspecified osteoarthritis, unspecified site: Secondary | ICD-10-CM | POA: Diagnosis not present

## 2019-03-22 DIAGNOSIS — K219 Gastro-esophageal reflux disease without esophagitis: Secondary | ICD-10-CM | POA: Diagnosis not present

## 2019-03-22 DIAGNOSIS — Z87891 Personal history of nicotine dependence: Secondary | ICD-10-CM | POA: Diagnosis not present

## 2019-03-22 DIAGNOSIS — Z9181 History of falling: Secondary | ICD-10-CM | POA: Diagnosis not present

## 2019-03-22 DIAGNOSIS — G250 Essential tremor: Secondary | ICD-10-CM | POA: Diagnosis not present

## 2019-03-22 DIAGNOSIS — I10 Essential (primary) hypertension: Secondary | ICD-10-CM | POA: Diagnosis not present

## 2019-03-22 DIAGNOSIS — G4733 Obstructive sleep apnea (adult) (pediatric): Secondary | ICD-10-CM | POA: Diagnosis not present

## 2019-03-22 DIAGNOSIS — K5730 Diverticulosis of large intestine without perforation or abscess without bleeding: Secondary | ICD-10-CM | POA: Diagnosis not present

## 2019-03-22 DIAGNOSIS — G4700 Insomnia, unspecified: Secondary | ICD-10-CM | POA: Diagnosis not present

## 2019-03-22 DIAGNOSIS — J432 Centrilobular emphysema: Secondary | ICD-10-CM | POA: Diagnosis not present

## 2019-03-27 DIAGNOSIS — Z0000 Encounter for general adult medical examination without abnormal findings: Secondary | ICD-10-CM | POA: Diagnosis not present

## 2019-03-27 DIAGNOSIS — M8589 Other specified disorders of bone density and structure, multiple sites: Secondary | ICD-10-CM | POA: Diagnosis not present

## 2019-03-27 DIAGNOSIS — D649 Anemia, unspecified: Secondary | ICD-10-CM | POA: Diagnosis not present

## 2019-03-27 DIAGNOSIS — I10 Essential (primary) hypertension: Secondary | ICD-10-CM | POA: Diagnosis not present

## 2019-03-27 DIAGNOSIS — Z130 Encounter for screening for diseases of the blood and blood-forming organs and certain disorders involving the immune mechanism: Secondary | ICD-10-CM | POA: Diagnosis not present

## 2019-03-27 DIAGNOSIS — Z87891 Personal history of nicotine dependence: Secondary | ICD-10-CM | POA: Diagnosis not present

## 2019-03-27 DIAGNOSIS — Z13228 Encounter for screening for other metabolic disorders: Secondary | ICD-10-CM | POA: Diagnosis not present

## 2019-03-27 DIAGNOSIS — Z1321 Encounter for screening for nutritional disorder: Secondary | ICD-10-CM | POA: Diagnosis not present

## 2019-03-27 DIAGNOSIS — Z13220 Encounter for screening for lipoid disorders: Secondary | ICD-10-CM | POA: Diagnosis not present

## 2019-03-28 DIAGNOSIS — R0902 Hypoxemia: Secondary | ICD-10-CM | POA: Diagnosis not present

## 2019-03-28 DIAGNOSIS — J449 Chronic obstructive pulmonary disease, unspecified: Secondary | ICD-10-CM | POA: Diagnosis not present

## 2019-03-28 DIAGNOSIS — S92302A Fracture of unspecified metatarsal bone(s), left foot, initial encounter for closed fracture: Secondary | ICD-10-CM | POA: Diagnosis not present

## 2019-04-04 DIAGNOSIS — J432 Centrilobular emphysema: Secondary | ICD-10-CM | POA: Diagnosis not present

## 2019-04-04 DIAGNOSIS — J449 Chronic obstructive pulmonary disease, unspecified: Secondary | ICD-10-CM | POA: Diagnosis not present

## 2019-04-04 DIAGNOSIS — R0902 Hypoxemia: Secondary | ICD-10-CM | POA: Diagnosis not present

## 2019-04-06 DIAGNOSIS — Z96698 Presence of other orthopedic joint implants: Secondary | ICD-10-CM | POA: Diagnosis not present

## 2019-04-06 DIAGNOSIS — M8588 Other specified disorders of bone density and structure, other site: Secondary | ICD-10-CM | POA: Diagnosis not present

## 2019-04-06 DIAGNOSIS — M8448XD Pathological fracture, other site, subsequent encounter for fracture with routine healing: Secondary | ICD-10-CM | POA: Diagnosis not present

## 2019-04-07 DIAGNOSIS — M80031D Age-related osteoporosis with current pathological fracture, right forearm, subsequent encounter for fracture with routine healing: Secondary | ICD-10-CM | POA: Diagnosis not present

## 2019-04-07 DIAGNOSIS — J9610 Chronic respiratory failure, unspecified whether with hypoxia or hypercapnia: Secondary | ICD-10-CM | POA: Diagnosis not present

## 2019-04-07 DIAGNOSIS — G4733 Obstructive sleep apnea (adult) (pediatric): Secondary | ICD-10-CM | POA: Diagnosis not present

## 2019-04-07 DIAGNOSIS — Z9181 History of falling: Secondary | ICD-10-CM | POA: Diagnosis not present

## 2019-04-07 DIAGNOSIS — J432 Centrilobular emphysema: Secondary | ICD-10-CM | POA: Diagnosis not present

## 2019-04-07 DIAGNOSIS — M1990 Unspecified osteoarthritis, unspecified site: Secondary | ICD-10-CM | POA: Diagnosis not present

## 2019-04-07 DIAGNOSIS — K219 Gastro-esophageal reflux disease without esophagitis: Secondary | ICD-10-CM | POA: Diagnosis not present

## 2019-04-07 DIAGNOSIS — Z87891 Personal history of nicotine dependence: Secondary | ICD-10-CM | POA: Diagnosis not present

## 2019-04-07 DIAGNOSIS — K5730 Diverticulosis of large intestine without perforation or abscess without bleeding: Secondary | ICD-10-CM | POA: Diagnosis not present

## 2019-04-07 DIAGNOSIS — G4700 Insomnia, unspecified: Secondary | ICD-10-CM | POA: Diagnosis not present

## 2019-04-07 DIAGNOSIS — I10 Essential (primary) hypertension: Secondary | ICD-10-CM | POA: Diagnosis not present

## 2019-04-07 DIAGNOSIS — G250 Essential tremor: Secondary | ICD-10-CM | POA: Diagnosis not present

## 2019-04-14 DIAGNOSIS — J432 Centrilobular emphysema: Secondary | ICD-10-CM | POA: Diagnosis not present

## 2019-04-14 DIAGNOSIS — R0902 Hypoxemia: Secondary | ICD-10-CM | POA: Diagnosis not present

## 2019-04-14 DIAGNOSIS — J449 Chronic obstructive pulmonary disease, unspecified: Secondary | ICD-10-CM | POA: Diagnosis not present

## 2019-04-18 DIAGNOSIS — K5730 Diverticulosis of large intestine without perforation or abscess without bleeding: Secondary | ICD-10-CM | POA: Diagnosis not present

## 2019-04-18 DIAGNOSIS — J432 Centrilobular emphysema: Secondary | ICD-10-CM | POA: Diagnosis not present

## 2019-04-18 DIAGNOSIS — M80031D Age-related osteoporosis with current pathological fracture, right forearm, subsequent encounter for fracture with routine healing: Secondary | ICD-10-CM | POA: Diagnosis not present

## 2019-04-18 DIAGNOSIS — Z87891 Personal history of nicotine dependence: Secondary | ICD-10-CM | POA: Diagnosis not present

## 2019-04-18 DIAGNOSIS — G4733 Obstructive sleep apnea (adult) (pediatric): Secondary | ICD-10-CM | POA: Diagnosis not present

## 2019-04-18 DIAGNOSIS — I10 Essential (primary) hypertension: Secondary | ICD-10-CM | POA: Diagnosis not present

## 2019-04-18 DIAGNOSIS — G4700 Insomnia, unspecified: Secondary | ICD-10-CM | POA: Diagnosis not present

## 2019-04-18 DIAGNOSIS — M1990 Unspecified osteoarthritis, unspecified site: Secondary | ICD-10-CM | POA: Diagnosis not present

## 2019-04-18 DIAGNOSIS — K219 Gastro-esophageal reflux disease without esophagitis: Secondary | ICD-10-CM | POA: Diagnosis not present

## 2019-04-18 DIAGNOSIS — Z9181 History of falling: Secondary | ICD-10-CM | POA: Diagnosis not present

## 2019-04-18 DIAGNOSIS — J9610 Chronic respiratory failure, unspecified whether with hypoxia or hypercapnia: Secondary | ICD-10-CM | POA: Diagnosis not present

## 2019-04-18 DIAGNOSIS — G250 Essential tremor: Secondary | ICD-10-CM | POA: Diagnosis not present

## 2019-04-19 DIAGNOSIS — G250 Essential tremor: Secondary | ICD-10-CM | POA: Diagnosis not present

## 2019-04-19 DIAGNOSIS — G4733 Obstructive sleep apnea (adult) (pediatric): Secondary | ICD-10-CM | POA: Diagnosis not present

## 2019-04-19 DIAGNOSIS — M80031D Age-related osteoporosis with current pathological fracture, right forearm, subsequent encounter for fracture with routine healing: Secondary | ICD-10-CM | POA: Diagnosis not present

## 2019-04-19 DIAGNOSIS — M1990 Unspecified osteoarthritis, unspecified site: Secondary | ICD-10-CM | POA: Diagnosis not present

## 2019-04-19 DIAGNOSIS — I10 Essential (primary) hypertension: Secondary | ICD-10-CM | POA: Diagnosis not present

## 2019-04-19 DIAGNOSIS — Z9181 History of falling: Secondary | ICD-10-CM | POA: Diagnosis not present

## 2019-04-19 DIAGNOSIS — K219 Gastro-esophageal reflux disease without esophagitis: Secondary | ICD-10-CM | POA: Diagnosis not present

## 2019-04-19 DIAGNOSIS — J9610 Chronic respiratory failure, unspecified whether with hypoxia or hypercapnia: Secondary | ICD-10-CM | POA: Diagnosis not present

## 2019-04-19 DIAGNOSIS — K5730 Diverticulosis of large intestine without perforation or abscess without bleeding: Secondary | ICD-10-CM | POA: Diagnosis not present

## 2019-04-19 DIAGNOSIS — Z87891 Personal history of nicotine dependence: Secondary | ICD-10-CM | POA: Diagnosis not present

## 2019-04-19 DIAGNOSIS — J432 Centrilobular emphysema: Secondary | ICD-10-CM | POA: Diagnosis not present

## 2019-04-19 DIAGNOSIS — G4700 Insomnia, unspecified: Secondary | ICD-10-CM | POA: Diagnosis not present

## 2019-04-27 DIAGNOSIS — S92302A Fracture of unspecified metatarsal bone(s), left foot, initial encounter for closed fracture: Secondary | ICD-10-CM | POA: Diagnosis not present

## 2019-04-27 DIAGNOSIS — R0902 Hypoxemia: Secondary | ICD-10-CM | POA: Diagnosis not present

## 2019-04-27 DIAGNOSIS — J449 Chronic obstructive pulmonary disease, unspecified: Secondary | ICD-10-CM | POA: Diagnosis not present

## 2019-05-04 DIAGNOSIS — J432 Centrilobular emphysema: Secondary | ICD-10-CM | POA: Diagnosis not present

## 2019-05-04 DIAGNOSIS — J449 Chronic obstructive pulmonary disease, unspecified: Secondary | ICD-10-CM | POA: Diagnosis not present

## 2019-05-04 DIAGNOSIS — R0902 Hypoxemia: Secondary | ICD-10-CM | POA: Diagnosis not present

## 2019-05-14 DIAGNOSIS — J432 Centrilobular emphysema: Secondary | ICD-10-CM | POA: Diagnosis not present

## 2019-05-14 DIAGNOSIS — J449 Chronic obstructive pulmonary disease, unspecified: Secondary | ICD-10-CM | POA: Diagnosis not present

## 2019-05-14 DIAGNOSIS — R0902 Hypoxemia: Secondary | ICD-10-CM | POA: Diagnosis not present

## 2019-05-15 DIAGNOSIS — S52501A Unspecified fracture of the lower end of right radius, initial encounter for closed fracture: Secondary | ICD-10-CM | POA: Diagnosis not present

## 2019-05-15 DIAGNOSIS — S52571D Other intraarticular fracture of lower end of right radius, subsequent encounter for closed fracture with routine healing: Secondary | ICD-10-CM | POA: Diagnosis not present

## 2019-05-28 DIAGNOSIS — S92302A Fracture of unspecified metatarsal bone(s), left foot, initial encounter for closed fracture: Secondary | ICD-10-CM | POA: Diagnosis not present

## 2019-05-28 DIAGNOSIS — R0902 Hypoxemia: Secondary | ICD-10-CM | POA: Diagnosis not present

## 2019-05-28 DIAGNOSIS — J449 Chronic obstructive pulmonary disease, unspecified: Secondary | ICD-10-CM | POA: Diagnosis not present

## 2019-06-04 DIAGNOSIS — J432 Centrilobular emphysema: Secondary | ICD-10-CM | POA: Diagnosis not present

## 2019-06-04 DIAGNOSIS — J449 Chronic obstructive pulmonary disease, unspecified: Secondary | ICD-10-CM | POA: Diagnosis not present

## 2019-06-04 DIAGNOSIS — R0902 Hypoxemia: Secondary | ICD-10-CM | POA: Diagnosis not present

## 2019-06-05 DIAGNOSIS — I739 Peripheral vascular disease, unspecified: Secondary | ICD-10-CM | POA: Diagnosis not present

## 2019-06-05 DIAGNOSIS — M65872 Other synovitis and tenosynovitis, left ankle and foot: Secondary | ICD-10-CM | POA: Diagnosis not present

## 2019-06-05 DIAGNOSIS — B351 Tinea unguium: Secondary | ICD-10-CM | POA: Diagnosis not present

## 2019-06-05 DIAGNOSIS — M65871 Other synovitis and tenosynovitis, right ankle and foot: Secondary | ICD-10-CM | POA: Diagnosis not present

## 2019-06-13 DIAGNOSIS — J449 Chronic obstructive pulmonary disease, unspecified: Secondary | ICD-10-CM | POA: Diagnosis not present

## 2019-06-13 DIAGNOSIS — H6123 Impacted cerumen, bilateral: Secondary | ICD-10-CM | POA: Diagnosis not present

## 2019-06-13 DIAGNOSIS — R5383 Other fatigue: Secondary | ICD-10-CM | POA: Diagnosis not present

## 2019-06-13 DIAGNOSIS — R0602 Shortness of breath: Secondary | ICD-10-CM | POA: Diagnosis not present

## 2019-06-14 DIAGNOSIS — J432 Centrilobular emphysema: Secondary | ICD-10-CM | POA: Diagnosis not present

## 2019-06-14 DIAGNOSIS — J449 Chronic obstructive pulmonary disease, unspecified: Secondary | ICD-10-CM | POA: Diagnosis not present

## 2019-06-14 DIAGNOSIS — R0902 Hypoxemia: Secondary | ICD-10-CM | POA: Diagnosis not present

## 2019-06-16 DIAGNOSIS — Z1231 Encounter for screening mammogram for malignant neoplasm of breast: Secondary | ICD-10-CM | POA: Diagnosis not present

## 2019-06-23 DIAGNOSIS — S52501D Unspecified fracture of the lower end of right radius, subsequent encounter for closed fracture with routine healing: Secondary | ICD-10-CM | POA: Diagnosis not present

## 2019-06-23 DIAGNOSIS — M25511 Pain in right shoulder: Secondary | ICD-10-CM | POA: Diagnosis not present

## 2019-06-23 DIAGNOSIS — M19011 Primary osteoarthritis, right shoulder: Secondary | ICD-10-CM | POA: Diagnosis not present

## 2019-06-28 DIAGNOSIS — S92302A Fracture of unspecified metatarsal bone(s), left foot, initial encounter for closed fracture: Secondary | ICD-10-CM | POA: Diagnosis not present

## 2019-06-28 DIAGNOSIS — R0902 Hypoxemia: Secondary | ICD-10-CM | POA: Diagnosis not present

## 2019-06-28 DIAGNOSIS — J449 Chronic obstructive pulmonary disease, unspecified: Secondary | ICD-10-CM | POA: Diagnosis not present

## 2019-06-29 DIAGNOSIS — M25511 Pain in right shoulder: Secondary | ICD-10-CM | POA: Diagnosis not present

## 2019-06-29 DIAGNOSIS — M8448XD Pathological fracture, other site, subsequent encounter for fracture with routine healing: Secondary | ICD-10-CM | POA: Diagnosis not present

## 2019-06-29 DIAGNOSIS — Z9181 History of falling: Secondary | ICD-10-CM | POA: Diagnosis not present

## 2019-06-29 DIAGNOSIS — G8929 Other chronic pain: Secondary | ICD-10-CM | POA: Diagnosis not present

## 2019-06-29 DIAGNOSIS — Z789 Other specified health status: Secondary | ICD-10-CM | POA: Diagnosis not present

## 2019-06-29 DIAGNOSIS — M19011 Primary osteoarthritis, right shoulder: Secondary | ICD-10-CM | POA: Diagnosis not present

## 2019-06-29 DIAGNOSIS — M25531 Pain in right wrist: Secondary | ICD-10-CM | POA: Diagnosis not present

## 2019-06-29 DIAGNOSIS — Z7409 Other reduced mobility: Secondary | ICD-10-CM | POA: Diagnosis not present

## 2019-06-29 DIAGNOSIS — R2689 Other abnormalities of gait and mobility: Secondary | ICD-10-CM | POA: Diagnosis not present

## 2019-06-29 DIAGNOSIS — S52501D Unspecified fracture of the lower end of right radius, subsequent encounter for closed fracture with routine healing: Secondary | ICD-10-CM | POA: Diagnosis not present

## 2019-06-29 DIAGNOSIS — M25559 Pain in unspecified hip: Secondary | ICD-10-CM | POA: Diagnosis not present

## 2019-06-29 DIAGNOSIS — S52601D Unspecified fracture of lower end of right ulna, subsequent encounter for closed fracture with routine healing: Secondary | ICD-10-CM | POA: Diagnosis not present

## 2019-07-05 DIAGNOSIS — J432 Centrilobular emphysema: Secondary | ICD-10-CM | POA: Diagnosis not present

## 2019-07-05 DIAGNOSIS — J449 Chronic obstructive pulmonary disease, unspecified: Secondary | ICD-10-CM | POA: Diagnosis not present

## 2019-07-05 DIAGNOSIS — R0902 Hypoxemia: Secondary | ICD-10-CM | POA: Diagnosis not present

## 2019-07-15 DIAGNOSIS — J449 Chronic obstructive pulmonary disease, unspecified: Secondary | ICD-10-CM | POA: Diagnosis not present

## 2019-07-15 DIAGNOSIS — R0902 Hypoxemia: Secondary | ICD-10-CM | POA: Diagnosis not present

## 2019-07-15 DIAGNOSIS — J432 Centrilobular emphysema: Secondary | ICD-10-CM | POA: Diagnosis not present

## 2019-07-20 DIAGNOSIS — Z789 Other specified health status: Secondary | ICD-10-CM | POA: Diagnosis not present

## 2019-07-20 DIAGNOSIS — R2689 Other abnormalities of gait and mobility: Secondary | ICD-10-CM | POA: Diagnosis not present

## 2019-07-20 DIAGNOSIS — M25631 Stiffness of right wrist, not elsewhere classified: Secondary | ICD-10-CM | POA: Diagnosis not present

## 2019-07-20 DIAGNOSIS — Z7409 Other reduced mobility: Secondary | ICD-10-CM | POA: Diagnosis not present

## 2019-07-20 DIAGNOSIS — G8929 Other chronic pain: Secondary | ICD-10-CM | POA: Diagnosis not present

## 2019-07-20 DIAGNOSIS — S52501D Unspecified fracture of the lower end of right radius, subsequent encounter for closed fracture with routine healing: Secondary | ICD-10-CM | POA: Diagnosis not present

## 2019-07-20 DIAGNOSIS — S52601D Unspecified fracture of lower end of right ulna, subsequent encounter for closed fracture with routine healing: Secondary | ICD-10-CM | POA: Diagnosis not present

## 2019-07-20 DIAGNOSIS — M25559 Pain in unspecified hip: Secondary | ICD-10-CM | POA: Diagnosis not present

## 2019-07-20 DIAGNOSIS — Z9181 History of falling: Secondary | ICD-10-CM | POA: Diagnosis not present

## 2019-07-20 DIAGNOSIS — M8448XD Pathological fracture, other site, subsequent encounter for fracture with routine healing: Secondary | ICD-10-CM | POA: Diagnosis not present

## 2019-07-20 DIAGNOSIS — M25511 Pain in right shoulder: Secondary | ICD-10-CM | POA: Diagnosis not present

## 2019-07-25 DIAGNOSIS — J449 Chronic obstructive pulmonary disease, unspecified: Secondary | ICD-10-CM | POA: Diagnosis not present

## 2019-07-25 DIAGNOSIS — M810 Age-related osteoporosis without current pathological fracture: Secondary | ICD-10-CM | POA: Diagnosis not present

## 2019-07-25 DIAGNOSIS — I10 Essential (primary) hypertension: Secondary | ICD-10-CM | POA: Diagnosis not present

## 2019-07-26 DIAGNOSIS — J449 Chronic obstructive pulmonary disease, unspecified: Secondary | ICD-10-CM | POA: Diagnosis not present

## 2019-07-26 DIAGNOSIS — R0902 Hypoxemia: Secondary | ICD-10-CM | POA: Diagnosis not present

## 2019-07-26 DIAGNOSIS — S92302A Fracture of unspecified metatarsal bone(s), left foot, initial encounter for closed fracture: Secondary | ICD-10-CM | POA: Diagnosis not present

## 2019-08-01 DIAGNOSIS — M25559 Pain in unspecified hip: Secondary | ICD-10-CM | POA: Diagnosis not present

## 2019-08-01 DIAGNOSIS — M25511 Pain in right shoulder: Secondary | ICD-10-CM | POA: Diagnosis not present

## 2019-08-01 DIAGNOSIS — R2689 Other abnormalities of gait and mobility: Secondary | ICD-10-CM | POA: Diagnosis not present

## 2019-08-01 DIAGNOSIS — M25622 Stiffness of left elbow, not elsewhere classified: Secondary | ICD-10-CM | POA: Diagnosis not present

## 2019-08-01 DIAGNOSIS — Z9181 History of falling: Secondary | ICD-10-CM | POA: Diagnosis not present

## 2019-08-01 DIAGNOSIS — Z7409 Other reduced mobility: Secondary | ICD-10-CM | POA: Diagnosis not present

## 2019-08-01 DIAGNOSIS — M8448XD Pathological fracture, other site, subsequent encounter for fracture with routine healing: Secondary | ICD-10-CM | POA: Diagnosis not present

## 2019-08-01 DIAGNOSIS — Z789 Other specified health status: Secondary | ICD-10-CM | POA: Diagnosis not present

## 2019-08-01 DIAGNOSIS — G8929 Other chronic pain: Secondary | ICD-10-CM | POA: Diagnosis not present

## 2019-08-02 DIAGNOSIS — J449 Chronic obstructive pulmonary disease, unspecified: Secondary | ICD-10-CM | POA: Diagnosis not present

## 2019-08-02 DIAGNOSIS — J432 Centrilobular emphysema: Secondary | ICD-10-CM | POA: Diagnosis not present

## 2019-08-02 DIAGNOSIS — R0902 Hypoxemia: Secondary | ICD-10-CM | POA: Diagnosis not present

## 2019-08-03 IMAGING — CR DG LUMBAR SPINE COMPLETE 4+V
5 series · 5 of 5 positions shown · non-contrast
Comparison: Body CT 09/15/2017

CLINICAL DATA: Mid lumbosacral spine pain post fall.

EXAM:
LUMBAR SPINE - COMPLETE 4+ VIEW

[t lumbar spine ap]
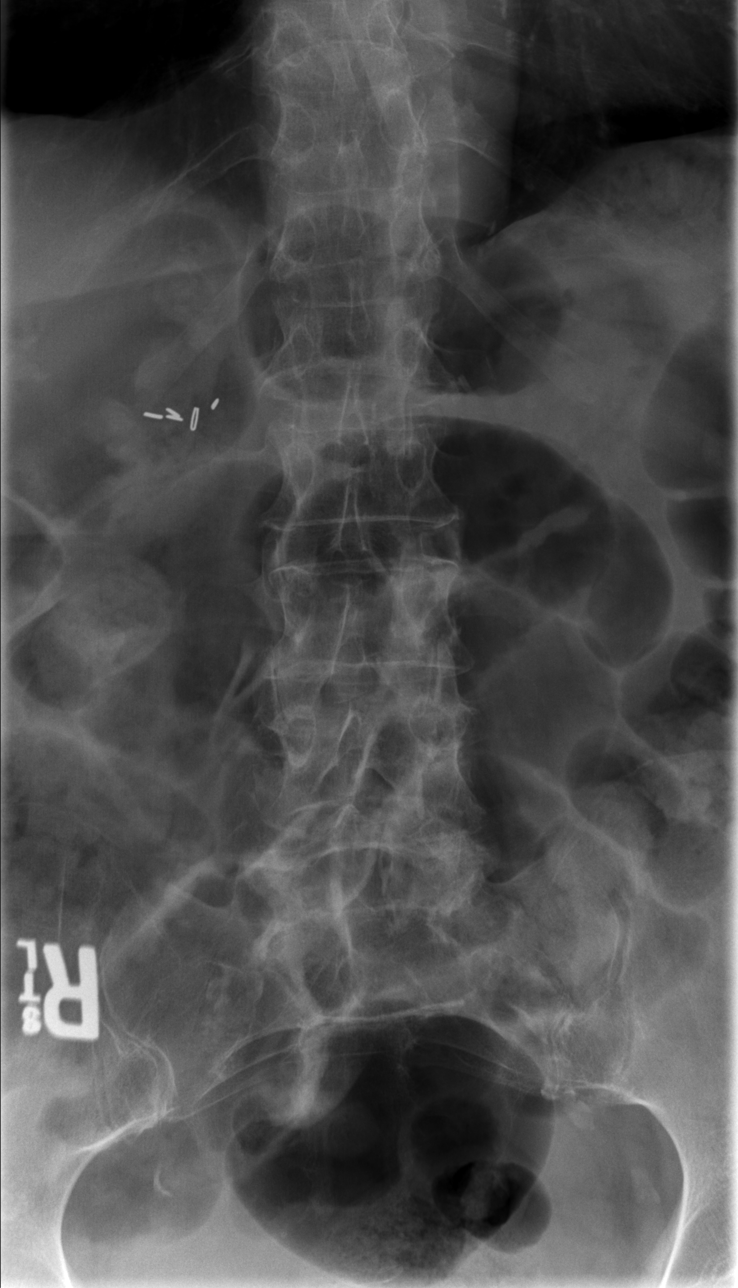

[t lumbar spine obl (1 of 2)]
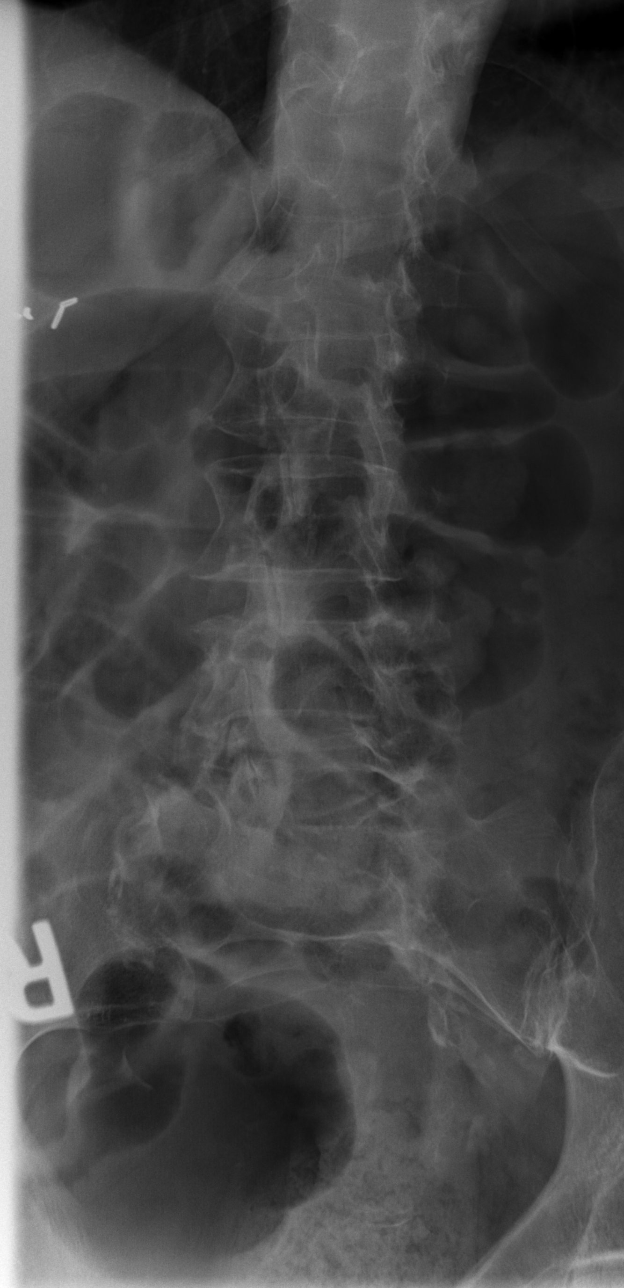

[t lumbar spine lat]
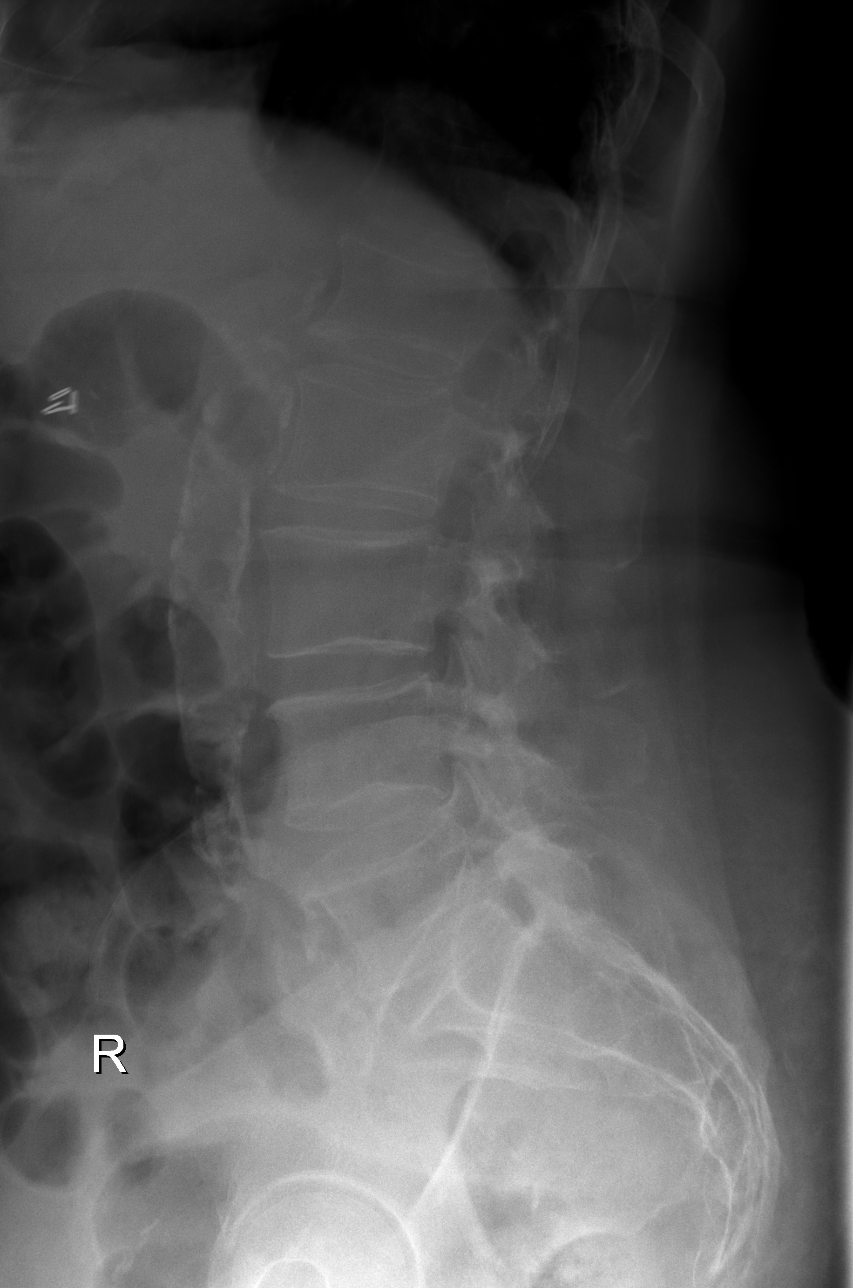

[t lumbar l-5 s-1 spot]
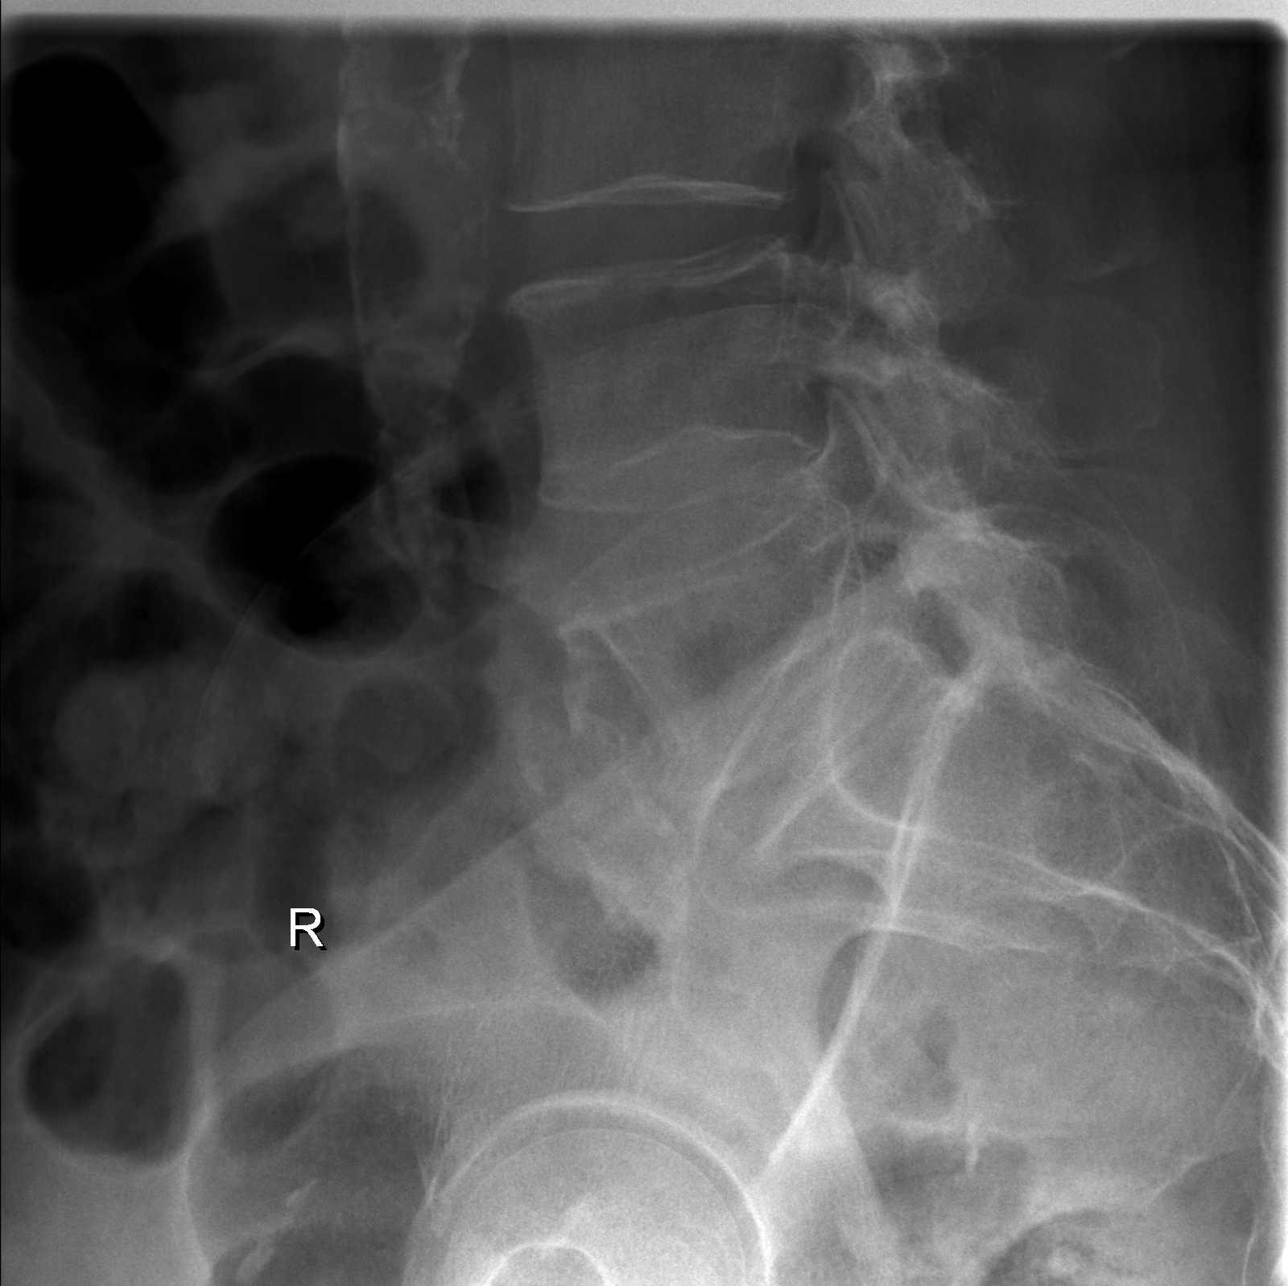

[t lumbar spine obl (2 of 2)]
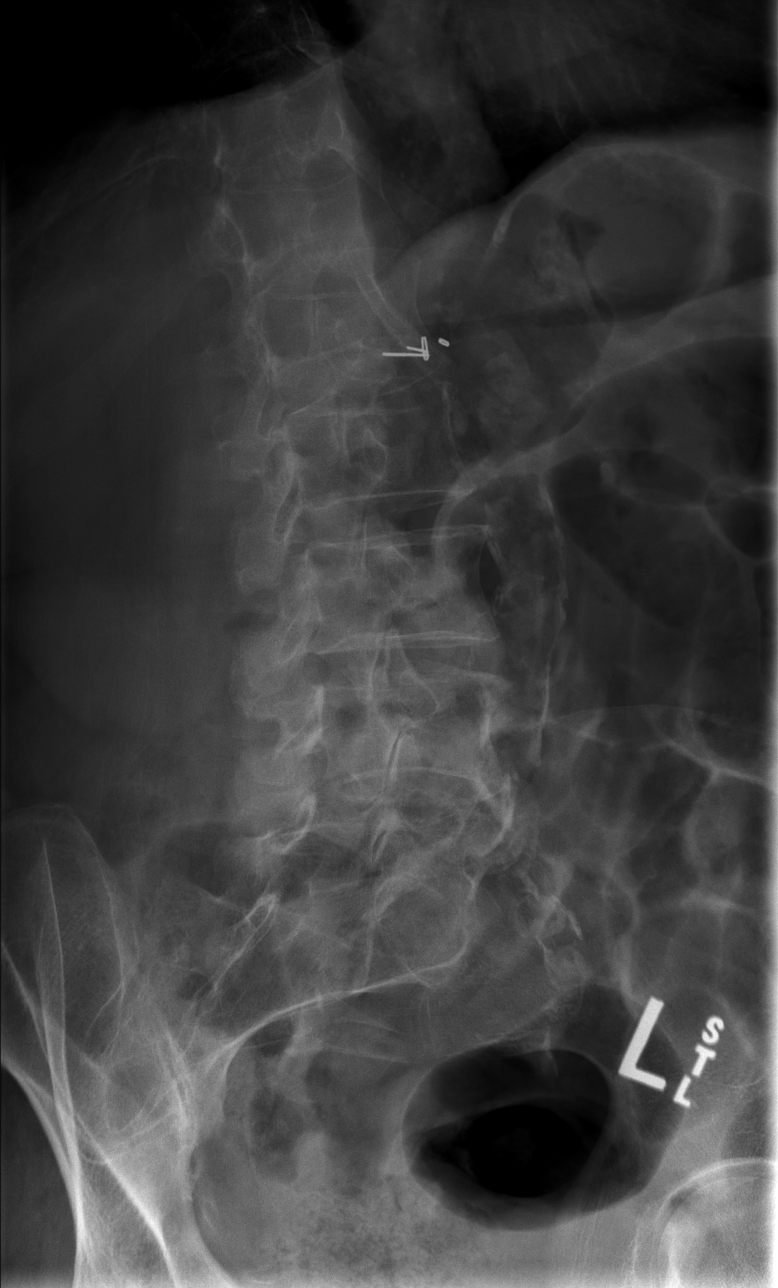

[5 of 5 positions shown; findings below may reference images not displayed]

FINDINGS: There is no evidence of lumbar spine fracture. Alignment is normal.
Mild height loss of T12, L1, L3, L4, favored to be degenerative in
etiology.

Heavy calcific atherosclerotic disease of the aorta.

Gas is distension of the abdomen.
IMPRESSION: No definite evidence of lumbosacral spine fracture.

Mild height loss of T12, L1, L3, L4, favored to be degenerative in
etiology.

## 2019-08-08 DIAGNOSIS — M818 Other osteoporosis without current pathological fracture: Secondary | ICD-10-CM | POA: Diagnosis not present

## 2019-08-08 DIAGNOSIS — Z7189 Other specified counseling: Secondary | ICD-10-CM | POA: Diagnosis not present

## 2019-08-08 DIAGNOSIS — Z87311 Personal history of (healed) other pathological fracture: Secondary | ICD-10-CM | POA: Diagnosis not present

## 2019-08-08 DIAGNOSIS — Z9181 History of falling: Secondary | ICD-10-CM | POA: Diagnosis not present

## 2019-08-08 DIAGNOSIS — Z789 Other specified health status: Secondary | ICD-10-CM | POA: Diagnosis not present

## 2019-08-08 DIAGNOSIS — M8448XD Pathological fracture, other site, subsequent encounter for fracture with routine healing: Secondary | ICD-10-CM | POA: Diagnosis not present

## 2019-08-08 DIAGNOSIS — M25511 Pain in right shoulder: Secondary | ICD-10-CM | POA: Diagnosis not present

## 2019-08-08 DIAGNOSIS — M25559 Pain in unspecified hip: Secondary | ICD-10-CM | POA: Diagnosis not present

## 2019-08-08 DIAGNOSIS — Z7409 Other reduced mobility: Secondary | ICD-10-CM | POA: Diagnosis not present

## 2019-08-08 DIAGNOSIS — S52501D Unspecified fracture of the lower end of right radius, subsequent encounter for closed fracture with routine healing: Secondary | ICD-10-CM | POA: Diagnosis not present

## 2019-08-08 DIAGNOSIS — S52601D Unspecified fracture of lower end of right ulna, subsequent encounter for closed fracture with routine healing: Secondary | ICD-10-CM | POA: Diagnosis not present

## 2019-08-08 DIAGNOSIS — G8929 Other chronic pain: Secondary | ICD-10-CM | POA: Diagnosis not present

## 2019-08-08 DIAGNOSIS — R2689 Other abnormalities of gait and mobility: Secondary | ICD-10-CM | POA: Diagnosis not present

## 2019-08-08 DIAGNOSIS — E559 Vitamin D deficiency, unspecified: Secondary | ICD-10-CM | POA: Diagnosis not present

## 2019-08-08 DIAGNOSIS — M25631 Stiffness of right wrist, not elsewhere classified: Secondary | ICD-10-CM | POA: Diagnosis not present

## 2019-08-08 DIAGNOSIS — M25531 Pain in right wrist: Secondary | ICD-10-CM | POA: Diagnosis not present

## 2019-08-08 IMAGING — DX DG ABDOMEN 1V
2 series · 2 of 2 positions shown · non-contrast
Comparison: 01/22/2018

CLINICAL DATA: Pt states her chest feels fine, she is a little sob,
but she states that is her normal, hx copd, , htn, , for her abd,
she has been constipated since her ORIF elbow, feels she needs to
have a Bm but cannot, no abd pains

EXAM:
ABDOMEN - 1 VIEW

[abdomen kub (1 of 2)]
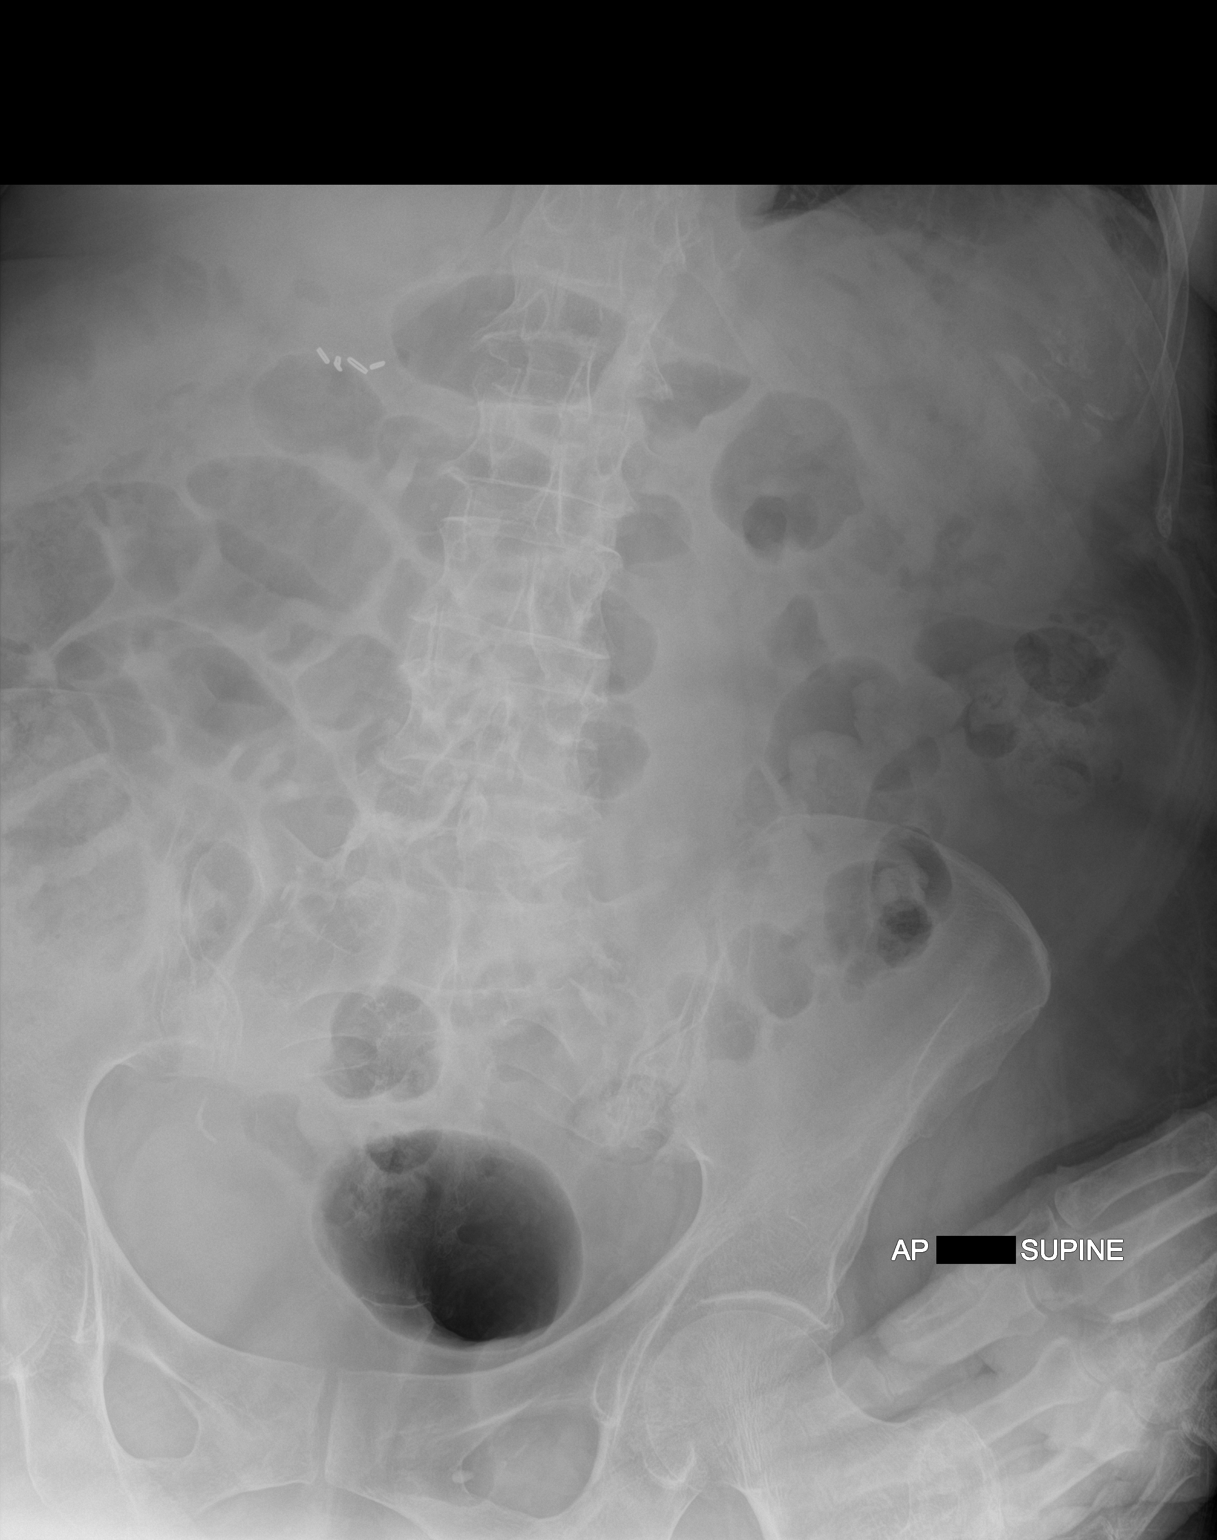

[abdomen kub (2 of 2)]
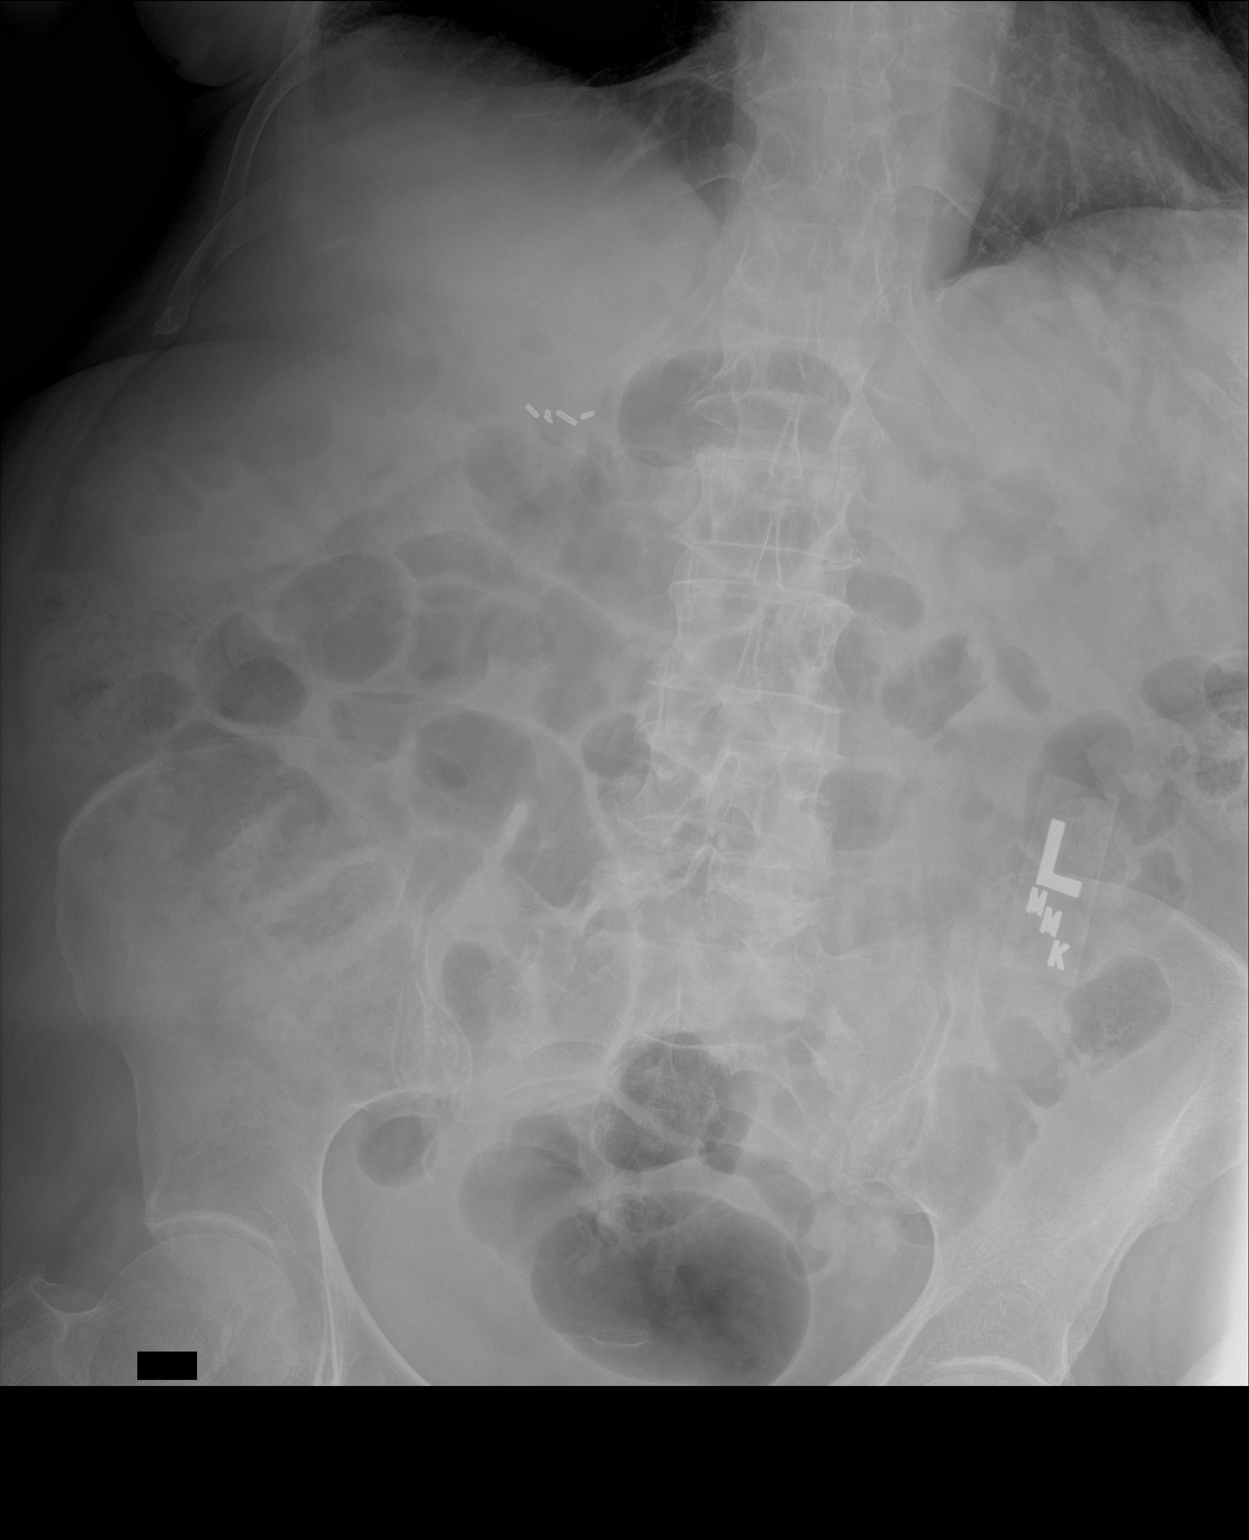

[2 of 2 positions shown; findings below may reference images not displayed]

FINDINGS: The bowel gas pattern is normal. Average stool burden. Surgical
clips are present in the RIGHT UPPER QUADRANT of the abdomen. No
radio-opaque calculi or other significant radiographic abnormality
are seen.
IMPRESSION: Negative.

## 2019-08-11 DIAGNOSIS — S300XXA Contusion of lower back and pelvis, initial encounter: Secondary | ICD-10-CM | POA: Diagnosis not present

## 2019-08-11 DIAGNOSIS — R102 Pelvic and perineal pain: Secondary | ICD-10-CM | POA: Diagnosis not present

## 2019-08-11 DIAGNOSIS — M160 Bilateral primary osteoarthritis of hip: Secondary | ICD-10-CM | POA: Diagnosis not present

## 2019-08-12 DIAGNOSIS — R0902 Hypoxemia: Secondary | ICD-10-CM | POA: Diagnosis not present

## 2019-08-12 DIAGNOSIS — J449 Chronic obstructive pulmonary disease, unspecified: Secondary | ICD-10-CM | POA: Diagnosis not present

## 2019-08-12 DIAGNOSIS — J432 Centrilobular emphysema: Secondary | ICD-10-CM | POA: Diagnosis not present

## 2019-08-15 DIAGNOSIS — M25531 Pain in right wrist: Secondary | ICD-10-CM | POA: Diagnosis not present

## 2019-08-15 DIAGNOSIS — M8448XD Pathological fracture, other site, subsequent encounter for fracture with routine healing: Secondary | ICD-10-CM | POA: Diagnosis not present

## 2019-08-15 DIAGNOSIS — Z789 Other specified health status: Secondary | ICD-10-CM | POA: Diagnosis not present

## 2019-08-15 DIAGNOSIS — R2689 Other abnormalities of gait and mobility: Secondary | ICD-10-CM | POA: Diagnosis not present

## 2019-08-15 DIAGNOSIS — M25631 Stiffness of right wrist, not elsewhere classified: Secondary | ICD-10-CM | POA: Diagnosis not present

## 2019-08-15 DIAGNOSIS — Z7409 Other reduced mobility: Secondary | ICD-10-CM | POA: Diagnosis not present

## 2019-08-15 DIAGNOSIS — M25559 Pain in unspecified hip: Secondary | ICD-10-CM | POA: Diagnosis not present

## 2019-08-15 DIAGNOSIS — Z9181 History of falling: Secondary | ICD-10-CM | POA: Diagnosis not present

## 2019-08-15 DIAGNOSIS — G8929 Other chronic pain: Secondary | ICD-10-CM | POA: Diagnosis not present

## 2019-08-15 DIAGNOSIS — M25511 Pain in right shoulder: Secondary | ICD-10-CM | POA: Diagnosis not present

## 2019-08-22 DIAGNOSIS — R2689 Other abnormalities of gait and mobility: Secondary | ICD-10-CM | POA: Diagnosis not present

## 2019-08-22 DIAGNOSIS — Z9181 History of falling: Secondary | ICD-10-CM | POA: Diagnosis not present

## 2019-08-22 DIAGNOSIS — Z789 Other specified health status: Secondary | ICD-10-CM | POA: Diagnosis not present

## 2019-08-22 DIAGNOSIS — Z7409 Other reduced mobility: Secondary | ICD-10-CM | POA: Diagnosis not present

## 2019-08-22 DIAGNOSIS — M25511 Pain in right shoulder: Secondary | ICD-10-CM | POA: Diagnosis not present

## 2019-08-22 DIAGNOSIS — G8929 Other chronic pain: Secondary | ICD-10-CM | POA: Diagnosis not present

## 2019-08-22 DIAGNOSIS — M25559 Pain in unspecified hip: Secondary | ICD-10-CM | POA: Diagnosis not present

## 2019-08-22 DIAGNOSIS — M8448XD Pathological fracture, other site, subsequent encounter for fracture with routine healing: Secondary | ICD-10-CM | POA: Diagnosis not present

## 2019-08-29 DIAGNOSIS — Z7409 Other reduced mobility: Secondary | ICD-10-CM | POA: Diagnosis not present

## 2019-08-29 DIAGNOSIS — G8929 Other chronic pain: Secondary | ICD-10-CM | POA: Diagnosis not present

## 2019-08-29 DIAGNOSIS — M25559 Pain in unspecified hip: Secondary | ICD-10-CM | POA: Diagnosis not present

## 2019-08-29 DIAGNOSIS — M25511 Pain in right shoulder: Secondary | ICD-10-CM | POA: Diagnosis not present

## 2019-08-29 DIAGNOSIS — Z9181 History of falling: Secondary | ICD-10-CM | POA: Diagnosis not present

## 2019-08-29 DIAGNOSIS — R2689 Other abnormalities of gait and mobility: Secondary | ICD-10-CM | POA: Diagnosis not present

## 2019-08-29 DIAGNOSIS — M8448XD Pathological fracture, other site, subsequent encounter for fracture with routine healing: Secondary | ICD-10-CM | POA: Diagnosis not present

## 2019-08-29 DIAGNOSIS — M25631 Stiffness of right wrist, not elsewhere classified: Secondary | ICD-10-CM | POA: Diagnosis not present

## 2019-08-29 DIAGNOSIS — M25531 Pain in right wrist: Secondary | ICD-10-CM | POA: Diagnosis not present

## 2019-08-29 DIAGNOSIS — Z789 Other specified health status: Secondary | ICD-10-CM | POA: Diagnosis not present

## 2019-09-02 DIAGNOSIS — J449 Chronic obstructive pulmonary disease, unspecified: Secondary | ICD-10-CM | POA: Diagnosis not present

## 2019-09-02 DIAGNOSIS — J432 Centrilobular emphysema: Secondary | ICD-10-CM | POA: Diagnosis not present

## 2019-09-05 DIAGNOSIS — Z7409 Other reduced mobility: Secondary | ICD-10-CM | POA: Diagnosis not present

## 2019-09-05 DIAGNOSIS — S52601D Unspecified fracture of lower end of right ulna, subsequent encounter for closed fracture with routine healing: Secondary | ICD-10-CM | POA: Diagnosis not present

## 2019-09-05 DIAGNOSIS — G8929 Other chronic pain: Secondary | ICD-10-CM | POA: Diagnosis not present

## 2019-09-05 DIAGNOSIS — M25631 Stiffness of right wrist, not elsewhere classified: Secondary | ICD-10-CM | POA: Diagnosis not present

## 2019-09-05 DIAGNOSIS — Z9181 History of falling: Secondary | ICD-10-CM | POA: Diagnosis not present

## 2019-09-05 DIAGNOSIS — M8448XD Pathological fracture, other site, subsequent encounter for fracture with routine healing: Secondary | ICD-10-CM | POA: Diagnosis not present

## 2019-09-05 DIAGNOSIS — M25531 Pain in right wrist: Secondary | ICD-10-CM | POA: Diagnosis not present

## 2019-09-05 DIAGNOSIS — M25559 Pain in unspecified hip: Secondary | ICD-10-CM | POA: Diagnosis not present

## 2019-09-05 DIAGNOSIS — S52501D Unspecified fracture of the lower end of right radius, subsequent encounter for closed fracture with routine healing: Secondary | ICD-10-CM | POA: Diagnosis not present

## 2019-09-05 DIAGNOSIS — Z789 Other specified health status: Secondary | ICD-10-CM | POA: Diagnosis not present

## 2019-09-05 DIAGNOSIS — M25511 Pain in right shoulder: Secondary | ICD-10-CM | POA: Diagnosis not present

## 2019-09-05 DIAGNOSIS — R2689 Other abnormalities of gait and mobility: Secondary | ICD-10-CM | POA: Diagnosis not present

## 2019-09-12 DIAGNOSIS — Z7409 Other reduced mobility: Secondary | ICD-10-CM | POA: Diagnosis not present

## 2019-09-12 DIAGNOSIS — Z789 Other specified health status: Secondary | ICD-10-CM | POA: Diagnosis not present

## 2019-09-12 DIAGNOSIS — J432 Centrilobular emphysema: Secondary | ICD-10-CM | POA: Diagnosis not present

## 2019-09-12 DIAGNOSIS — R2689 Other abnormalities of gait and mobility: Secondary | ICD-10-CM | POA: Diagnosis not present

## 2019-09-12 DIAGNOSIS — M8448XD Pathological fracture, other site, subsequent encounter for fracture with routine healing: Secondary | ICD-10-CM | POA: Diagnosis not present

## 2019-09-12 DIAGNOSIS — Z9181 History of falling: Secondary | ICD-10-CM | POA: Diagnosis not present

## 2019-09-12 DIAGNOSIS — J449 Chronic obstructive pulmonary disease, unspecified: Secondary | ICD-10-CM | POA: Diagnosis not present

## 2019-09-12 DIAGNOSIS — M25559 Pain in unspecified hip: Secondary | ICD-10-CM | POA: Diagnosis not present

## 2019-09-19 DIAGNOSIS — Z9181 History of falling: Secondary | ICD-10-CM | POA: Diagnosis not present

## 2019-09-19 DIAGNOSIS — R2689 Other abnormalities of gait and mobility: Secondary | ICD-10-CM | POA: Diagnosis not present

## 2019-09-19 DIAGNOSIS — M25559 Pain in unspecified hip: Secondary | ICD-10-CM | POA: Diagnosis not present

## 2019-09-19 DIAGNOSIS — M8448XD Pathological fracture, other site, subsequent encounter for fracture with routine healing: Secondary | ICD-10-CM | POA: Diagnosis not present

## 2019-09-19 DIAGNOSIS — Z7409 Other reduced mobility: Secondary | ICD-10-CM | POA: Diagnosis not present

## 2019-09-19 DIAGNOSIS — Z789 Other specified health status: Secondary | ICD-10-CM | POA: Diagnosis not present

## 2019-09-26 DIAGNOSIS — R2689 Other abnormalities of gait and mobility: Secondary | ICD-10-CM | POA: Diagnosis not present

## 2019-09-26 DIAGNOSIS — M8448XD Pathological fracture, other site, subsequent encounter for fracture with routine healing: Secondary | ICD-10-CM | POA: Diagnosis not present

## 2019-09-26 DIAGNOSIS — M25559 Pain in unspecified hip: Secondary | ICD-10-CM | POA: Diagnosis not present

## 2019-09-26 DIAGNOSIS — Z789 Other specified health status: Secondary | ICD-10-CM | POA: Diagnosis not present

## 2019-09-26 DIAGNOSIS — Z9181 History of falling: Secondary | ICD-10-CM | POA: Diagnosis not present

## 2019-09-26 DIAGNOSIS — Z7409 Other reduced mobility: Secondary | ICD-10-CM | POA: Diagnosis not present

## 2019-10-02 DIAGNOSIS — J449 Chronic obstructive pulmonary disease, unspecified: Secondary | ICD-10-CM | POA: Diagnosis not present

## 2019-10-02 DIAGNOSIS — J432 Centrilobular emphysema: Secondary | ICD-10-CM | POA: Diagnosis not present

## 2019-10-02 DIAGNOSIS — H10011 Acute follicular conjunctivitis, right eye: Secondary | ICD-10-CM | POA: Diagnosis not present

## 2019-10-02 DIAGNOSIS — H02842 Edema of right lower eyelid: Secondary | ICD-10-CM | POA: Diagnosis not present

## 2019-10-03 DIAGNOSIS — Z789 Other specified health status: Secondary | ICD-10-CM | POA: Diagnosis not present

## 2019-10-03 DIAGNOSIS — M25631 Stiffness of right wrist, not elsewhere classified: Secondary | ICD-10-CM | POA: Diagnosis not present

## 2019-10-03 DIAGNOSIS — M8448XD Pathological fracture, other site, subsequent encounter for fracture with routine healing: Secondary | ICD-10-CM | POA: Diagnosis not present

## 2019-10-03 DIAGNOSIS — M25531 Pain in right wrist: Secondary | ICD-10-CM | POA: Diagnosis not present

## 2019-10-03 DIAGNOSIS — Z7409 Other reduced mobility: Secondary | ICD-10-CM | POA: Diagnosis not present

## 2019-10-03 DIAGNOSIS — Z9181 History of falling: Secondary | ICD-10-CM | POA: Diagnosis not present

## 2019-10-03 DIAGNOSIS — M25559 Pain in unspecified hip: Secondary | ICD-10-CM | POA: Diagnosis not present

## 2019-10-03 DIAGNOSIS — R2689 Other abnormalities of gait and mobility: Secondary | ICD-10-CM | POA: Diagnosis not present

## 2019-10-03 DIAGNOSIS — M25632 Stiffness of left wrist, not elsewhere classified: Secondary | ICD-10-CM | POA: Diagnosis not present

## 2019-10-05 DIAGNOSIS — S32121D Minimally displaced Zone II fracture of sacrum, subsequent encounter for fracture with routine healing: Secondary | ICD-10-CM | POA: Diagnosis not present

## 2019-10-10 DIAGNOSIS — Z9181 History of falling: Secondary | ICD-10-CM | POA: Diagnosis not present

## 2019-10-10 DIAGNOSIS — Z789 Other specified health status: Secondary | ICD-10-CM | POA: Diagnosis not present

## 2019-10-10 DIAGNOSIS — M25631 Stiffness of right wrist, not elsewhere classified: Secondary | ICD-10-CM | POA: Diagnosis not present

## 2019-10-10 DIAGNOSIS — Z7409 Other reduced mobility: Secondary | ICD-10-CM | POA: Diagnosis not present

## 2019-10-10 DIAGNOSIS — M25559 Pain in unspecified hip: Secondary | ICD-10-CM | POA: Diagnosis not present

## 2019-10-10 DIAGNOSIS — R2689 Other abnormalities of gait and mobility: Secondary | ICD-10-CM | POA: Diagnosis not present

## 2019-10-10 DIAGNOSIS — M8448XD Pathological fracture, other site, subsequent encounter for fracture with routine healing: Secondary | ICD-10-CM | POA: Diagnosis not present

## 2019-10-12 DIAGNOSIS — J449 Chronic obstructive pulmonary disease, unspecified: Secondary | ICD-10-CM | POA: Diagnosis not present

## 2019-10-12 DIAGNOSIS — J432 Centrilobular emphysema: Secondary | ICD-10-CM | POA: Diagnosis not present

## 2019-10-17 ENCOUNTER — Encounter: Payer: Self-pay | Admitting: Internal Medicine

## 2019-10-17 DIAGNOSIS — M8448XD Pathological fracture, other site, subsequent encounter for fracture with routine healing: Secondary | ICD-10-CM | POA: Diagnosis not present

## 2019-10-17 DIAGNOSIS — Z9181 History of falling: Secondary | ICD-10-CM | POA: Diagnosis not present

## 2019-10-17 DIAGNOSIS — Z789 Other specified health status: Secondary | ICD-10-CM | POA: Diagnosis not present

## 2019-10-17 DIAGNOSIS — R2689 Other abnormalities of gait and mobility: Secondary | ICD-10-CM | POA: Diagnosis not present

## 2019-10-17 DIAGNOSIS — M25531 Pain in right wrist: Secondary | ICD-10-CM | POA: Diagnosis not present

## 2019-10-17 DIAGNOSIS — Z7409 Other reduced mobility: Secondary | ICD-10-CM | POA: Diagnosis not present

## 2019-10-17 DIAGNOSIS — M25632 Stiffness of left wrist, not elsewhere classified: Secondary | ICD-10-CM | POA: Diagnosis not present

## 2019-10-17 DIAGNOSIS — M25631 Stiffness of right wrist, not elsewhere classified: Secondary | ICD-10-CM | POA: Diagnosis not present

## 2019-10-17 DIAGNOSIS — M25559 Pain in unspecified hip: Secondary | ICD-10-CM | POA: Diagnosis not present

## 2019-10-27 DIAGNOSIS — M8448XD Pathological fracture, other site, subsequent encounter for fracture with routine healing: Secondary | ICD-10-CM | POA: Diagnosis not present

## 2019-10-27 DIAGNOSIS — R2689 Other abnormalities of gait and mobility: Secondary | ICD-10-CM | POA: Diagnosis not present

## 2019-10-27 DIAGNOSIS — M25559 Pain in unspecified hip: Secondary | ICD-10-CM | POA: Diagnosis not present

## 2019-10-27 DIAGNOSIS — Z9181 History of falling: Secondary | ICD-10-CM | POA: Diagnosis not present

## 2019-10-27 DIAGNOSIS — Z7409 Other reduced mobility: Secondary | ICD-10-CM | POA: Diagnosis not present

## 2019-10-27 DIAGNOSIS — Z789 Other specified health status: Secondary | ICD-10-CM | POA: Diagnosis not present

## 2019-10-31 DIAGNOSIS — Z7409 Other reduced mobility: Secondary | ICD-10-CM | POA: Diagnosis not present

## 2019-10-31 DIAGNOSIS — M8448XD Pathological fracture, other site, subsequent encounter for fracture with routine healing: Secondary | ICD-10-CM | POA: Diagnosis not present

## 2019-10-31 DIAGNOSIS — R2689 Other abnormalities of gait and mobility: Secondary | ICD-10-CM | POA: Diagnosis not present

## 2019-10-31 DIAGNOSIS — M25559 Pain in unspecified hip: Secondary | ICD-10-CM | POA: Diagnosis not present

## 2019-10-31 DIAGNOSIS — Z9181 History of falling: Secondary | ICD-10-CM | POA: Diagnosis not present

## 2019-10-31 DIAGNOSIS — Z789 Other specified health status: Secondary | ICD-10-CM | POA: Diagnosis not present

## 2019-11-02 DIAGNOSIS — J449 Chronic obstructive pulmonary disease, unspecified: Secondary | ICD-10-CM | POA: Diagnosis not present

## 2019-11-02 DIAGNOSIS — J432 Centrilobular emphysema: Secondary | ICD-10-CM | POA: Diagnosis not present

## 2019-11-03 DIAGNOSIS — J432 Centrilobular emphysema: Secondary | ICD-10-CM | POA: Diagnosis not present

## 2019-11-03 DIAGNOSIS — R8290 Unspecified abnormal findings in urine: Secondary | ICD-10-CM | POA: Diagnosis not present

## 2019-11-03 DIAGNOSIS — K3184 Gastroparesis: Secondary | ICD-10-CM | POA: Diagnosis not present

## 2019-11-03 DIAGNOSIS — J449 Chronic obstructive pulmonary disease, unspecified: Secondary | ICD-10-CM | POA: Diagnosis not present

## 2019-11-03 DIAGNOSIS — Z9981 Dependence on supplemental oxygen: Secondary | ICD-10-CM | POA: Diagnosis not present

## 2019-11-03 DIAGNOSIS — I10 Essential (primary) hypertension: Secondary | ICD-10-CM | POA: Diagnosis not present

## 2019-11-03 DIAGNOSIS — J42 Unspecified chronic bronchitis: Secondary | ICD-10-CM | POA: Diagnosis not present

## 2019-11-03 DIAGNOSIS — Z87891 Personal history of nicotine dependence: Secondary | ICD-10-CM | POA: Diagnosis not present

## 2019-11-09 DIAGNOSIS — Z789 Other specified health status: Secondary | ICD-10-CM | POA: Diagnosis not present

## 2019-11-09 DIAGNOSIS — R2689 Other abnormalities of gait and mobility: Secondary | ICD-10-CM | POA: Diagnosis not present

## 2019-11-09 DIAGNOSIS — Z9181 History of falling: Secondary | ICD-10-CM | POA: Diagnosis not present

## 2019-11-09 DIAGNOSIS — M25559 Pain in unspecified hip: Secondary | ICD-10-CM | POA: Diagnosis not present

## 2019-11-09 DIAGNOSIS — Z7409 Other reduced mobility: Secondary | ICD-10-CM | POA: Diagnosis not present

## 2019-11-09 DIAGNOSIS — M8448XD Pathological fracture, other site, subsequent encounter for fracture with routine healing: Secondary | ICD-10-CM | POA: Diagnosis not present

## 2019-11-10 DIAGNOSIS — E871 Hypo-osmolality and hyponatremia: Secondary | ICD-10-CM | POA: Diagnosis not present

## 2019-11-12 DIAGNOSIS — J449 Chronic obstructive pulmonary disease, unspecified: Secondary | ICD-10-CM | POA: Diagnosis not present

## 2019-11-12 DIAGNOSIS — J432 Centrilobular emphysema: Secondary | ICD-10-CM | POA: Diagnosis not present

## 2019-11-15 DIAGNOSIS — B351 Tinea unguium: Secondary | ICD-10-CM | POA: Diagnosis not present

## 2019-11-15 DIAGNOSIS — M65872 Other synovitis and tenosynovitis, left ankle and foot: Secondary | ICD-10-CM | POA: Diagnosis not present

## 2019-11-15 DIAGNOSIS — M65871 Other synovitis and tenosynovitis, right ankle and foot: Secondary | ICD-10-CM | POA: Diagnosis not present

## 2019-11-15 DIAGNOSIS — I739 Peripheral vascular disease, unspecified: Secondary | ICD-10-CM | POA: Diagnosis not present

## 2019-11-21 ENCOUNTER — Telehealth: Payer: Self-pay | Admitting: Cardiology

## 2019-11-21 DIAGNOSIS — M8448XD Pathological fracture, other site, subsequent encounter for fracture with routine healing: Secondary | ICD-10-CM | POA: Diagnosis not present

## 2019-11-21 DIAGNOSIS — R2689 Other abnormalities of gait and mobility: Secondary | ICD-10-CM | POA: Diagnosis not present

## 2019-11-21 DIAGNOSIS — M25559 Pain in unspecified hip: Secondary | ICD-10-CM | POA: Diagnosis not present

## 2019-11-21 DIAGNOSIS — Z9181 History of falling: Secondary | ICD-10-CM | POA: Diagnosis not present

## 2019-11-21 DIAGNOSIS — Z7409 Other reduced mobility: Secondary | ICD-10-CM | POA: Diagnosis not present

## 2019-11-21 DIAGNOSIS — Z789 Other specified health status: Secondary | ICD-10-CM | POA: Diagnosis not present

## 2019-11-21 NOTE — Telephone Encounter (Signed)
As DOD how do you advise?

## 2019-11-21 NOTE — Telephone Encounter (Signed)
She needs to be seen in the office, please have her make an appointment.

## 2019-11-21 NOTE — Telephone Encounter (Signed)
Virginia Burns is calling stating both her PCP and Podiatrist feel her feet problems are vascular and that Amedeo Plenty is needing a Doppler test performed on her legs. Please advise.

## 2019-11-22 ENCOUNTER — Telehealth: Payer: Self-pay | Admitting: Internal Medicine

## 2019-11-22 ENCOUNTER — Encounter: Payer: Self-pay | Admitting: Internal Medicine

## 2019-11-22 ENCOUNTER — Ambulatory Visit: Payer: PPO | Admitting: Internal Medicine

## 2019-11-22 VITALS — BP 130/78 | HR 80 | Ht 66.0 in | Wt 165.0 lb

## 2019-11-22 DIAGNOSIS — K219 Gastro-esophageal reflux disease without esophagitis: Secondary | ICD-10-CM | POA: Diagnosis not present

## 2019-11-22 DIAGNOSIS — R070 Pain in throat: Secondary | ICD-10-CM | POA: Diagnosis not present

## 2019-11-22 DIAGNOSIS — K3184 Gastroparesis: Secondary | ICD-10-CM

## 2019-11-22 MED ORDER — SUCRALFATE 1 GM/10ML PO SUSP: ORAL | 1 refills | Status: DC

## 2019-11-22 MED ORDER — MAGIC MOUTHWASH: 5.0000 mL | Freq: Three times a day (TID) | ORAL | 1 refills | Status: DC

## 2019-11-22 MED ORDER — AMBULATORY NON FORMULARY MEDICATION: 10.0000 mg | Freq: Three times a day (TID) | 3 refills | Status: DC

## 2019-11-22 NOTE — Telephone Encounter (Signed)
Faxed Magic Mouthwash rx to pharmacy

## 2019-11-22 NOTE — Patient Instructions (Signed)
We have sent the following medications to your pharmacy for you to pick up at your convenience:  Carafate, magic mouthwash.  I will fax the prescription for Domperidone to [facility].

## 2019-11-22 NOTE — Progress Notes (Signed)
HISTORY OF PRESENT ILLNESS:  Virginia Burns is a 74 y.o. 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  Previous patient of Dr. Verl Blalock.  She has been followed in this office over the years for chronic GERD, gastroparesis, constipation, and adenomatous colon polyps.  I last saw the patient November 2018 regarding gastroparesis for which he takes domperidone.  Also chronic GERD for which she is on pantoprazole 40 mg twice daily.  She presents today with chief complaint of throat burning.  This is been present over the past month.  She states that she has this despite twice daily PPI and Carafate therapy.  She has had significant decline in her health since her last visit.  She has had a number of orthopedic problems secondary to traumatic falls including left elbow fracture, pelvic fracture, right wrist fracture.  She requires a walker.  She denies significant classic reflux symptoms but this is that her new complaint of throat burning was related to the same.  She has been on regular oxygen therapy over the past 3 to 4 months.  No dysphagia.  Her last complete colonoscopy was performed July 2016.  She was found to have 1 diminutive adenoma and moderate diverticulosis.  She inquires about the need for future surveillance colonoscopy despite her health issues.  She does have chronic constipation for which she takes MiraLAX  REVIEW OF SYSTEMS:  All non-GI ROS negative as otherwise stated in HPI except for arthritis, back pain, sinus and allergy, fatigue, shortness of breath  Past Medical History:  Diagnosis Date   Adjustment disorder with anxiety 08/17/2013   Allergic rhinitis    Anxiety state 08/17/2013   Overview:  IMPRESSION: hx of anxiety, lost a friend and has friend in ICU, using the ativan prn. had refilled 07/27/13   Asthma    Benign neoplasm of ascending colon 12/07/2014   Benign neoplasm of  descending colon 12/07/2014   Centrilobular emphysema ([place]) 12/07/2014   Chronic respiratory failure ([place]) 35/57/3220   Complication of anesthesia    Versed- hard time working - "dinging for days" - Colonoscopy   COPD (chronic obstructive pulmonary disease) ([place])    COPD (chronic obstructive pulmonary disease) with chronic bronchitis ([place]) 02/10/2013   Overnight pulse oximetry shows desaturations greater than 5 minutes at a time less than 88%.  Order signed for nocturnal O2 at 2 L/m February 21, 2016  Overview:  Overnight pulse oximetry shows desaturations greater than 5 minutes at a time less than 88%.  Order signed for nocturnal O2 at 2 L/m February 21, 2016 Overview:  Overnight pulse oximetry shows desaturations greater than 5 minutes at a time l   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 therapy 12/07/2014   Diverticulosis of colon 10/12/2007   Qualifier: Diagnosis of  By: Nils Pyle CMA (AAMA), Mearl Latin     Diverticulosis of colon (without mention of hemorrhage)    Emphysema of lung ([place])    Essential tremor 05/22/2013   Family history of adverse reaction to anesthesia    Mother- hard to awaken and confusion.   Family history of malignant neoplasm of gastrointestinal tract    Gastroparesis    GERD (gastroesophageal reflux disease)    GERD with esophagitis 10/12/2007   Qualifier: Diagnosis of  By: Nils Pyle CMA (AAMA), Mearl Latin    Overview:  Overview:  Qualifier: Diagnosis of  By: Nils Pyle CMA Deborra Medina), Mearl Latin  Overview:  Overview:  Qualifier: Diagnosis of  By: Nils Pyle  CMA (AAMA), Leisha    History of tobacco abuse 09/18/2013   Humerus distal fracture 01/24/2018   Hx of colonic polyps    Hypertension    Hypertension, benign 07/19/2007   Qualifier: Diagnosis of  By: Garen Grams    Overview:  Overview:  STORY: The goal for blood pressure is less than 140/90.  If you are checking your blood pressure at home, please record it and bring it to your next office visit. 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  I have reviewed the recent sleep study results with the patient.  We discussed how sleep apnea can affect various health problems including risks for hypertension, cardiovascular disease, and diabetes.  We also discussed how sleep disruption can increase risks for accident, such as while driving.  Weight loss as a means of improving sl   On home oxygen therapy    oxygen at night   OSA (obstructive sleep apnea) 09/18/2013   Osteoarthrosis 12/09/2012   Osteopenia of multiple sites 10/12/2007   Qualifier: Diagnosis of  By: Nils Pyle CMA (AAMA), Mearl Latin     Osteoporosis    Other esophagitis    Personal history of colonic polyps 10/07/2009   tubular adenoma   Shortness of breath dyspnea    Tachycardia, paroxysmal ([place]) 07/04/2018   Urinary tract infection    Vitamin B12 deficiency    Vitamin D deficiency 12/09/2012   Overview:  Overview:  IMPRESSION: Appropiate labs done today. We will send the results and adjust treatment as needed. Bone density discussed. There has  been an improvement in her bone density comparing the one in 2009 from the one 2011. From osteoporotic she went to osteopenic. She did take Actonel 3 years ago for several years and stopped it due to GI side effects. We will continue mantaining f    Past Surgical History:  Procedure Laterality Date   ABDOMINAL HYSTERECTOMY     ABDOMINAL HYSTERECTOMY     CATARACT EXTRACTION Right    CHOLECYSTECTOMY     COLONOSCOPY WITH PROPOFOL N/A 11/21/2014   Procedure: COLONOSCOPY WITH PROPOFOL;  Surgeon: Irene Shipper, MD;  Location: [facility];  Service: Endoscopy;  Laterality: N/A;    FRACTURE SURGERY     GALLBLADDER SURGERY  1986   LIGAMENT REPAIR Left 04/18/2013   Procedure: Triangular Fibrocartilage complex open repair;  Surgeon: Jolyn Nap, MD;  Location: [facility];  Service: Orthopedics;  Laterality: Left;   OOPHORECTOMY     OPEN REDUCTION INTERNAL FIXATION (ORIF) DISTAL RADIAL FRACTURE Left 04/18/2013   Procedure: LEFT OPEN REDUCTION INTERNAL FIXATION (ORIF) DISTAL RADIAL FRACTURE;  Surgeon: Jolyn Nap, MD;  Location: [facility];  Service: Orthopedics;  Laterality: Left;   ORIF ELBOW FRACTURE Left 01/24/2018   Procedure: OPEN REDUCTION INTERNAL FIXATION (ORIF) DISTAL HUMERUS  ELBOW/OLECRANON OSTEOTOMY FRACTURE;  Surgeon: [name], MD;  Location: [facility];  Service: Orthopedics;  Laterality: Left;    Social History ZEVA LEBER  reports that she quit smoking about 10 years ago. Her smoking use included cigarettes. She has a 25.00 pack-year smoking history. She has never used smokeless tobacco. She reports current alcohol use of  about 2.0 standard drinks of alcohol per week. She reports that she does not use drugs.  family history includes COPD in her mother; Colitis in her son; Colon cancer in her cousin; Diabetes in her brother; Diabetes type II in her son; Emphysema in her mother; Other in her father; Tremor in her maternal aunt and mother.  Allergies  Allergen Reactions   Clindamycin/Lincomycin Hives   Sulfur Rash       PHYSICAL EXAMINATION: Vital signs: BP 130/78    Pulse 80    Ht 5\' 6"  (1.676 m)    Wt 165 lb (74.8 kg)    BMI 26.63 kg/m   Constitutional: Frail and chronically ill-appearing, no acute distress Psychiatric: alert and oriented x3, cooperative Eyes: extraocular movements intact, anicteric, conjunctiva pink Mouth: oral pharynx moist, no lesions.  No pharyngeal erythema posteriorly.  No thrush. Neck: supple no lymphadenopathy Cardiovascular: heart regular rate and rhythm, no murmur Lungs:  clear to auscultation bilaterally Abdomen: soft, nontender, nondistended, no obvious ascites, no peritoneal signs, normal bowel sounds, no organomegaly Rectal: Omitted Extremities: no clubbing, cyanosis, or lower extremity edema bilaterally Skin: no lesions on visible extremities Neuro: No focal deficits.  Cranial nerves intact  ASSESSMENT:  1.  Burning sensation in throat.  No obvious abnormalities on physical exam.  This is coincident with her more regular use of oxygen therapy.  Suspect dry throat from oxygen therapy as the etiology. 2.  GERD.  Classic symptoms controlled on reflux regimen 3.  History of gastroparesis remotely.  On domperidone 4.  History of colon polyps.  Colonoscopy July 2016 5.  Multiple significant medical problems including advanced progressive end-stage lung disease.   PLAN:  1.  Trial of Magic mouthwash.  Prescribe 2.  Throat lozenges 3.  Continue reflux regimen 4.  Refill reflux and promotility medications. 5.  I would not offer the patient surveillance colonoscopy given her advanced lung disease.  She understands 6.  Return to the care of her primary provider and pulmonologist 40 minutes was spent preparing to see the patient, obtaining history, performing physical exam, counseling the patient regarding her above listed issues, ordering medications, and documenting information in the clinical health record

## 2019-11-23 NOTE — Telephone Encounter (Signed)
Spoke with pharmacist and communicated correct recipe and amounts for Magic Mouthwash.  She can now fill the prescription.

## 2019-11-23 NOTE — Telephone Encounter (Signed)
Pharmacy called stating they need Ingredients of Magic Mouth wash ordered and Ratios.

## 2019-11-27 DIAGNOSIS — S8002XA Contusion of left knee, initial encounter: Secondary | ICD-10-CM | POA: Diagnosis not present

## 2019-11-27 DIAGNOSIS — S8992XA Unspecified injury of left lower leg, initial encounter: Secondary | ICD-10-CM | POA: Diagnosis not present

## 2019-11-27 DIAGNOSIS — M25562 Pain in left knee: Secondary | ICD-10-CM | POA: Diagnosis not present

## 2019-11-27 DIAGNOSIS — S8991XA Unspecified injury of right lower leg, initial encounter: Secondary | ICD-10-CM | POA: Diagnosis not present

## 2019-11-27 DIAGNOSIS — S8001XA Contusion of right knee, initial encounter: Secondary | ICD-10-CM | POA: Diagnosis not present

## 2019-11-27 DIAGNOSIS — M25561 Pain in right knee: Secondary | ICD-10-CM | POA: Diagnosis not present

## 2019-11-29 ENCOUNTER — Other Ambulatory Visit: Payer: Self-pay

## 2019-11-29 MED ORDER — AMBULATORY NON FORMULARY MEDICATION: 10.0000 mg | Freq: Three times a day (TID) | 3 refills | Status: DC

## 2019-12-01 ENCOUNTER — Other Ambulatory Visit: Payer: Self-pay

## 2019-12-01 ENCOUNTER — Encounter: Payer: Self-pay | Admitting: Pulmonary Disease

## 2019-12-01 ENCOUNTER — Ambulatory Visit: Payer: PPO | Admitting: Pulmonary Disease

## 2019-12-01 VITALS — BP 116/76 | HR 83 | Temp 98.3°F | Ht 65.5 in | Wt 167.0 lb

## 2019-12-01 DIAGNOSIS — J449 Chronic obstructive pulmonary disease, unspecified: Secondary | ICD-10-CM | POA: Diagnosis not present

## 2019-12-01 DIAGNOSIS — J9611 Chronic respiratory failure with hypoxia: Secondary | ICD-10-CM | POA: Diagnosis not present

## 2019-12-01 MED ORDER — BREZTRI AEROSPHERE 160-9-4.8 MCG/ACT IN AERO: 2.0000 | INHALATION_SPRAY | Freq: Two times a day (BID) | RESPIRATORY_TRACT | 0 refills | Status: DC

## 2019-12-01 NOTE — Patient Instructions (Signed)
Try breztri two puffs in the morning and two puffs in the evening.  Call to update how this is working in one week.  Don't use spiriva or symbicort while using breztri.  Follow up in 6 months.

## 2019-12-01 NOTE — Progress Notes (Signed)
[facility]  Chief Complaint  Patient presents with  . Follow-up    get sob with walking shorter distances. Doing PT.    Constitutional:  BP 116/76 (BP Location: Right Arm, Cuff Size: Normal)   Pulse 83   Temp 98.3 F (36.8 C) (Oral)   Ht 5' 5.5" (1.664 m)   Wt 167 lb (75.8 kg)   SpO2 98%   BMI 27.37 kg/m   Past Medical History:  Anxiety, Diverticulosis, Tremor, Gastroparesis, GERD, Colon polyps, HTN, Insomnia, Goiter, OA, Osteopenia, B12 deficiency, Vit D deficiency  Summary:  Virginia Burns is a 74 y.o. female former smoker with COPD and nocturnal hypoxemia.  Subjective:   She had pelvic fracture.  Has been using a walker until recently.  She fell again and hurt her hand.  Has to use wheelchair for now.  Using 2 liters oxygen with exertion and sleep at night.  Not having cough, wheeze, sputum, chest pain, leg swelling.  Still works in Personal assistant.   Physical Exam:   Appearance - well kempt   ENMT - no sinus tenderness, no oral exudate, no LAN, Mallampati 2 airway, no stridor  Respiratory - equal breath sounds bilaterally, no wheezing or rales  CV - s1s2 regular rate and rhythm, no murmurs  Ext - no clubbing, no edema  Skin - no rashes  Psych - normal mood and affect  Assessment/Plan:   COPD with emphysema. - will have her try breztri in place of symbicort and spiriva - sample of breztri provided for her - she will call to update status and let us know if she would like to continue breztri  - she will check if she can get financial assistance for breztri like she does for symbicort - prn albuterol  Chronic respiratory failure with hypoxia. - 2 liters oxygen with exertion and sleep  A total of  23 minutes spent addressing patient care issues on day of visit.  Follow up:  Patient Instructions  Try breztri two puffs in the morning and two puffs in the evening.  Call to update how this is working in one  week.  Don't use spiriva or symbicort while using breztri.  Follow up in 6 months.   Signature:  Chesley Mires, MD [facility] Pager: [phone] 12/01/2019, 1:09 PM  Flow Sheet     Pulmonary tests:   PFT 08/09/07>>FEV1 1.54 (63%), FEV1% 53, TLC 3.42 (65%), DLCO 64%, no BD  PFT 02/10/13>>FEV1 1.32 (52%), FEV1% 58, TLC 5.07 (96%), DLCO 60%, no BD  Sleep tests:   PSG 10/24/13>>AHI 6.8, SaO2 low 84%, Spent 28.3 min with SaO2 <90%.  Cardiac tests:   Echo 07/18/18 >> EF 60 to 65%  Medications:   Allergies as of 12/01/2019      Reactions   Clindamycin/lincomycin Hives   Bactrim [sulfamethoxazole-trimethoprim] Swelling   swelling   Sulfur Rash      Medication List       Accurate as of December 01, 2019  1:09 PM. If you have any questions, ask your nurse or doctor.        STOP taking these medications   polyethylene glycol 17 g packet Commonly known as: MIRALAX / GLYCOLAX Stopped by: Chesley Mires, MD   predniSONE 10 MG tablet Commonly known as: DELTASONE Stopped by: Chesley Mires, MD     TAKE these medications   AMBULATORY NON FORMULARY MEDICATION Take 10 mg by mouth 3 (three) times daily before meals. Medication Name: Domperidone  amLODipine 5 MG tablet Commonly known as: NORVASC Take 5 mg by mouth daily.   aspirin EC 81 MG tablet Take 81 mg by mouth every other day.   budesonide-formoterol 160-4.5 MCG/ACT inhaler Commonly known as: Symbicort Inhale 2 puffs into the lungs 2 (two) times daily.   calcium carbonate 1250 (500 Ca) MG tablet Commonly known as: OS-CAL - dosed in mg of elemental calcium Take 1 tablet by mouth. 600mg  bid   cephALEXin 500 MG capsule Commonly known as: KEFLEX Take by mouth.   Cholecalciferol 125 MCG (5000 UT) Tabs Take by mouth.   denosumab 60 MG/ML Sosy injection Commonly known as: PROLIA Inject into the skin.   LORazepam 0.5 MG tablet Commonly known as: ATIVAN Take 0.5 mg by mouth as needed for  anxiety.   magic mouthwash Soln Take 5 mLs by mouth 3 (three) times daily.   pantoprazole 40 MG tablet Commonly known as: PROTONIX Take 40 mg by mouth 2 (two) times daily.   albuterol (2.5 MG/3ML) 0.083% nebulizer solution Commonly known as: PROVENTIL Inhale into the lungs every 6 (six) hours as needed for wheezing or shortness of breath.   ProAir HFA 108 (90 Base) MCG/ACT inhaler Generic drug: albuterol Inhale 2 puffs into the lungs every 6 (six) hours as needed for wheezing or shortness of breath.   sucralfate 1 GM/10ML suspension Commonly known as: Carafate TAKE 2 TEASPOONFUL (10 MLS) BY MOUTH  4 TIMES DAILY WITH MEALS AND AT BEDTIME   tiotropium 18 MCG inhalation capsule Commonly known as: SPIRIVA Place 1 capsule (18 mcg total) into inhaler and inhale daily.       Past Surgical History:  She  has a past surgical history that includes Cholecystectomy; Abdominal hysterectomy; Open reduction internal fixation (orif) distal radial fracture (Left, 04/18/2013); Ligament repair (Left, 04/18/2013); Oophorectomy; Colonoscopy with propofol (N/A, 11/21/2014); Gallbladder surgery (1986); Cataract extraction (Right); Abdominal hysterectomy; Fracture surgery; and ORIF elbow fracture (Left, 01/24/2018).  Family History:  Her family history includes COPD in her mother; Colitis in her son; Colon cancer in her cousin; Diabetes in her brother; Diabetes type II in her son; Emphysema in her mother; Other in her father; Tremor in her maternal aunt and mother.  Social History:  She  reports that she quit smoking about 10 years ago. Her smoking use included cigarettes. She has a 25.00 pack-year smoking history. She has never used smokeless tobacco. She reports current alcohol use of about 2.0 standard drinks of alcohol per week. She reports that she does not use drugs.

## 2019-12-01 NOTE — Addendum Note (Signed)
Addended by: Vanessa Imari on: 12/01/2019 01:54 PM   Modules accepted: Orders

## 2019-12-02 DIAGNOSIS — J432 Centrilobular emphysema: Secondary | ICD-10-CM | POA: Diagnosis not present

## 2019-12-02 DIAGNOSIS — J449 Chronic obstructive pulmonary disease, unspecified: Secondary | ICD-10-CM | POA: Diagnosis not present

## 2019-12-04 NOTE — Telephone Encounter (Signed)
Dr. Halford Chessman, please advise.  Hi Dr. Halford Chessman, After my visit with you on Friday i did some research on [place].  It seems it doesn't help with asthma.  I remember several years back we where talking & you said you thought asthma was a big part of my problem.  I have at least 3 month supply of  medicine I have been on symbicort and spriva.  Do you think best I stick with that for now and we discuss on my 6 month follow up?  I have not used Breztri. Thanks, Virginia Burns

## 2019-12-05 DIAGNOSIS — J449 Chronic obstructive pulmonary disease, unspecified: Secondary | ICD-10-CM | POA: Diagnosis not present

## 2019-12-12 DIAGNOSIS — J449 Chronic obstructive pulmonary disease, unspecified: Secondary | ICD-10-CM | POA: Diagnosis not present

## 2019-12-12 DIAGNOSIS — W19XXXA Unspecified fall, initial encounter: Secondary | ICD-10-CM | POA: Diagnosis not present

## 2019-12-12 DIAGNOSIS — J432 Centrilobular emphysema: Secondary | ICD-10-CM | POA: Diagnosis not present

## 2019-12-12 DIAGNOSIS — S8002XA Contusion of left knee, initial encounter: Secondary | ICD-10-CM | POA: Diagnosis not present

## 2019-12-12 DIAGNOSIS — S51812A Laceration without foreign body of left forearm, initial encounter: Secondary | ICD-10-CM | POA: Diagnosis not present

## 2019-12-18 DIAGNOSIS — S8001XD Contusion of right knee, subsequent encounter: Secondary | ICD-10-CM | POA: Diagnosis not present

## 2019-12-18 DIAGNOSIS — S51812A Laceration without foreign body of left forearm, initial encounter: Secondary | ICD-10-CM | POA: Diagnosis not present

## 2019-12-19 NOTE — Progress Notes (Signed)
Cardiology Office Note:    Date:  12/20/2019   ID:  Virginia Burns, DOB 1946-02-17, MRN [id number]  PCP:  Berkley Harvey, NP  Cardiologist:  Shirlee More, MD    Referring MD: Berkley Harvey, NP    ASSESSMENT:    1. Pain in both lower extremities   2. Cold extremities   3. Hypertension, benign   4. COPD (chronic obstructive pulmonary disease) with chronic bronchitis (HCC)    PLAN:    In order of problems listed above:  1. Although she has complaints of pain in her feet and cold extremities she has no obvious ischemia and has full pulses bilaterally.  ABIs segmental pressures ordered as a screen if abnormal would need focused duplex either inflow aortoiliac or lower extremity for further evaluation. 2. BP at target presently her calcium channel blocker 3. Stable COPD continue oxygen bronchodilators   Next appointment: 1 month   Medication Adjustments/Labs and Tests Ordered: Current medicines are reviewed at length with the patient today.  Concerns regarding medicines are outlined above.  Orders Placed This Encounter  Procedures  . VAS [place] ABI WITH/WO TBI   No orders of the defined types were placed in this encounter.   Chief Complaint  Patient presents with  . Follow-up    From podiatry for foot pain cold extremities    History of Present Illness:    Virginia Burns is a 74 y.o. female with a hx of heart murmur hypertension palpitation COPD and sleep apnea   last seen 07/14/2018.  She was referred by podiatry with recommendation of lower extremity vascular studies. Compliance with diet, lifestyle and medications: Yes  Her echocardiogram performed 07/18/2018 showed normal valvular function EF 60 to 65%.  [facility] mention that her feet are cold at night and at times discolored and she is advised to see me in the office regarding peripheral arterial disease.  She is not having typical claudication she has been extremely limited because of her pelvic  fracture and surgical intervention and a recent fall traumatizing her right knee without fracture.  Physical examination shows no evidence of resting ischemia skin is warm sensorimotor intact in both dorsalis pedis and posterior tibial pulses are present and feel normal bilaterally.  For further evaluation she will undergo ABIs and segmental pressures. Past Medical History:  Diagnosis Date  . Allergic rhinitis   . Anxiety state 08/17/2013   Overview:  IMPRESSION: hx of anxiety, lost a friend and has friend in ICU, using the ativan prn. had refilled 07/27/13  . Chronic respiratory failure ([place]) 03/04/2015  . Complication of anesthesia    [place]- hard time working - "dinging for days" - Colonoscopy  . COPD (chronic obstructive pulmonary disease) with emphysema ([place])   . Diverticulosis of colon 10/12/2007   Qualifier: Diagnosis of  By: Nils Pyle CMA (AAMA), Mearl Latin    . Essential tremor 05/22/2013  . Gastroparesis   . GERD (gastroesophageal reflux disease)   . Humerus distal fracture 01/24/2018  . Hx of colonic polyps   . Hypertension   . Insomnia 10/22/2012  . Multinodular goiter 06/01/2017  . 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 Osteoarthrosis 12/09/2012  . Osteopenia of multiple sites 10/12/2007   Qualifier: Diagnosis of  By: Nils Pyle CMA ([place]), Mearl Latin    . Osteoporosis   . Personal history of colonic polyps 10/07/2009   tubular adenoma  . Tachycardia, paroxysmal ([place]) 07/04/2018  . Urinary tract infection   . Vitamin B12 deficiency   .  Vitamin D deficiency 12/09/2012   Overview:  Overview:  IMPRESSION: Appropiate labs done today. We will send the results and adjust treatment as needed. Bone density discussed. There has  been an improvement in her bone density comparing the one in 2009 from the one 2011. From osteoporotic she went to osteopenic. She did take Actonel 3 years ago for several years and stopped it due to GI side effects. We will continue mantaining f    Past Surgical History:    Procedure Laterality Date  . ABDOMINAL HYSTERECTOMY    . ABDOMINAL HYSTERECTOMY    . CATARACT EXTRACTION Right   . CHOLECYSTECTOMY    . COLONOSCOPY WITH PROPOFOL N/A 11/21/2014   Procedure: COLONOSCOPY WITH PROPOFOL;  Surgeon: Irene Shipper, MD;  Location: [facility];  Service: Endoscopy;  Laterality: N/A;  . FRACTURE SURGERY    . [place]  . LIGAMENT REPAIR Left 04/18/2013   Procedure: Triangular Fibrocartilage complex open repair;  Surgeon: Jolyn Nap, MD;  Location: [facility];  Service: Orthopedics;  Laterality: Left;  . OOPHORECTOMY    . OPEN REDUCTION INTERNAL FIXATION (ORIF) DISTAL RADIAL FRACTURE Left 04/18/2013   Procedure: LEFT OPEN REDUCTION INTERNAL FIXATION (ORIF) DISTAL RADIAL FRACTURE;  Surgeon: Jolyn Nap, MD;  Location: [facility];  Service: Orthopedics;  Laterality: Left;  . ORIF ELBOW FRACTURE Left 01/24/2018   Procedure: OPEN REDUCTION INTERNAL FIXATION (ORIF) DISTAL HUMERUS  ELBOW/OLECRANON OSTEOTOMY FRACTURE;  Surgeon: [name], MD;  Location: [facility];  Service: Orthopedics;  Laterality: Left;    Current Medications: Current Meds  Medication Sig  . albuterol (PROAIR HFA) 108 (90 BASE) MCG/ACT inhaler Inhale 2 puffs into the lungs every 6 (six) hours as needed for wheezing or shortness of breath.  Marland Braman albuterol (PROVENTIL) (2.5 MG/3ML) 0.083% nebulizer solution Inhale into the lungs every 6 (six) hours as needed for wheezing or shortness of breath.   . AMBULATORY NON FORMULARY MEDICATION Take 10 mg by mouth 3 (three) times daily before meals. Medication Name: Domperidone  . amLODipine (NORVASC) 5 MG tablet Take 5 mg by mouth daily.   Marland [place]etteway aspirin EC 81 MG tablet Take 81 mg by mouth every other day.  . budesonide-formoterol (SYMBICORT) 160-4.5 MCG/ACT inhaler Inhale 2 puffs into the lungs 2 (two) times daily.  . calcium carbonate (OS-CAL - DOSED IN MG OF ELEMENTAL CALCIUM) 1250 (500 Ca) MG tablet Take 1 tablet by  mouth. 600mg  bid  . Cholecalciferol 125 MCG (5000 UT) TABS Take by mouth.  . denosumab (PROLIA) 60 MG/ML SOSY injection Inject into the skin.  Marland Murnane LORazepam (ATIVAN) 0.5 MG tablet Take 0.5 mg by mouth as needed for anxiety.   . magic mouthwash SOLN Take 5 mLs by mouth 3 (three) times daily.  . pantoprazole (PROTONIX) 40 MG tablet Take 40 mg by mouth 2 (two) times daily.   . sucralfate (CARAFATE) 1 GM/10ML suspension TAKE 2 TEASPOONFUL (10 MLS) BY MOUTH  4 TIMES DAILY WITH MEALS AND AT BEDTIME  . tiotropium (SPIRIVA) 18 MCG inhalation capsule Place 1 capsule (18 mcg total) into inhaler and inhale daily.     Allergies:   Clindamycin/lincomycin, Bactrim [sulfamethoxazole-trimethoprim], and Sulfur   Social History   Socioeconomic History  . Marital status: Single    Spouse name: Not on file  . Number of children: 1  . Years of education: Not on file  . Highest education level: Not on file  Occupational History  . Occupation: Cabin crew  Employer: [employer]  Tobacco Use  . Smoking status: Former Smoker    Packs/day: 1.00    Years: 25.00    Pack years: 25.00    Types: Cigarettes    Quit date: 05/18/2009    Years since quitting: 10.5  . Smokeless tobacco: Never Used  Vaping Use  . Vaping Use: Never used  Substance and Sexual Activity  . Alcohol use: Yes    Alcohol/week: 2.0 standard drinks    Types: 2 Glasses of wine per week    Comment: 2 glasses of wine each night  . Drug use: No  . Sexual activity: Not on file  Other Topics Concern  . 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    She lives with son.  She is divorced.         Social Determinants of Health   Financial Resource Strain:   . Difficulty of Paying Living Expenses:   Food Insecurity:   . Worried About Charity fundraiser in the Last Year:   . Arboriculturist in the Last Year:   Transportation Needs:   . Film/video editor (Medical):   Marland Stober Lack of  Transportation (Non-Medical):   Physical Activity:   . Days of Exercise per Week:   . Minutes of Exercise per Session:   Stress:   . Feeling of Stress :   Social Connections:   . Frequency of Communication with Friends and Family:   . Frequency of Social Gatherings with Friends and Family:   . Attends Religious Services:   . Active Member of Clubs or Organizations:   . Attends Archivist Meetings:   Marland Neubecker Marital Status:      Family History: The patient's family history includes COPD in her mother; Colitis in her son; Colon cancer in her cousin; Diabetes in her brother; Diabetes type II in her son; Emphysema in her mother; Other in her father; Tremor in her maternal aunt and mother. ROS:   Please see the history of present illness.    All other systems reviewed and are negative.  EKGs/Labs/Other Studies Reviewed:    The following studies were reviewed today:    Recent Labs:  11/10/2019 potassium 4.31 GFR greater than 90 cc  Right knee x-ray 11/27/2019 no fracture Physical Exam:    VS:  BP 136/78 (BP Location: Right Arm, Patient Position: Sitting, Cuff Size: Normal)   Pulse 98   Ht 5' 5.5" (1.664 m)   Wt 166 lb (75.3 kg)   SpO2 95%   BMI 27.20 kg/m     Wt Readings from Last 3 Encounters:  12/20/19 166 lb (75.3 kg)  12/01/19 167 lb (75.8 kg)  11/22/19 165 lb (74.8 kg)     GEN: She looks frail well nourished, well developed in no acute distress HEENT: Normal NECK: No JVD; No carotid bruits LYMPHATICS: No lymphadenopathy CARDIAC: RRR, no murmurs, rubs, gallops RESPIRATORY:  Clear to auscultation without rales, wheezing or rhonchi  ABDOMEN: Soft, non-tender, non-distended MUSCULOSKELETAL:  No edema; No deformity  SKIN: Warm and dry NEUROLOGIC:  Alert and oriented x 3 PSYCHIATRIC:  Normal affect    Signed, Shirlee More, MD  12/20/2019 3:00 PM    [place]

## 2019-12-20 ENCOUNTER — Other Ambulatory Visit: Payer: Self-pay

## 2019-12-20 ENCOUNTER — Ambulatory Visit: Payer: PPO | Admitting: Cardiology

## 2019-12-20 ENCOUNTER — Encounter: Payer: Self-pay | Admitting: Cardiology

## 2019-12-20 VITALS — BP 136/78 | HR 98 | Ht 65.5 in | Wt 166.0 lb

## 2019-12-20 DIAGNOSIS — M79605 Pain in left leg: Secondary | ICD-10-CM

## 2019-12-20 DIAGNOSIS — M79604 Pain in right leg: Secondary | ICD-10-CM | POA: Diagnosis not present

## 2019-12-20 DIAGNOSIS — J449 Chronic obstructive pulmonary disease, unspecified: Secondary | ICD-10-CM

## 2019-12-20 DIAGNOSIS — R6889 Other general symptoms and signs: Secondary | ICD-10-CM

## 2019-12-20 DIAGNOSIS — R209 Unspecified disturbances of skin sensation: Secondary | ICD-10-CM

## 2019-12-20 DIAGNOSIS — I10 Essential (primary) hypertension: Secondary | ICD-10-CM

## 2019-12-20 NOTE — Patient Instructions (Addendum)
Medication Instructions:  Your physician recommends that you continue on your current medications as directed. Please refer to the Current Medication list given to you today.  *If you need a refill on your cardiac medications before your next appointment, please call your pharmacy*   Lab Work: None If you have labs (blood work) drawn today and your tests are completely normal, you will receive your results only by: Marland Balaguer MyChart Message (if you have MyChart) OR . A paper copy in the mail If you have any lab test that is abnormal or we need to change your treatment, we will call you to review the results.   Testing/Procedures: Your physician has requested that you have an ankle brachial index (ABI). During this test an ultrasound and blood pressure cuff are used to evaluate the arteries that supply the arms and legs with blood. Allow thirty minutes for this exam. There are no restrictions or special instructions.    Follow-Up: At [facility], you and your health needs are our priority.  As part of our continuing mission to provide you with exceptional heart care, we have created designated Provider Care Teams.  These Care Teams include your primary Cardiologist (physician) and Advanced Practice Providers (APPs -  Physician Assistants and Nurse Practitioners) who all work together to provide you with the care you need, when you need it.  We recommend signing up for the patient portal called "MyChart".  Sign up information is provided on this After Visit Summary.  MyChart is used to connect with patients for Virtual Visits (Telemedicine).  Patients are able to view lab/test results, encounter notes, upcoming appointments, etc.  Non-urgent messages can be sent to your provider as well.   To learn more about what you can do with MyChart, go to [email or web address].    Your next appointment:   6 week(s)  The format for your next appointment:   In Person  Provider:   Shirlee More,  MD   Other Instructions

## 2019-12-25 DIAGNOSIS — S51812A Laceration without foreign body of left forearm, initial encounter: Secondary | ICD-10-CM | POA: Diagnosis not present

## 2019-12-25 DIAGNOSIS — S8001XD Contusion of right knee, subsequent encounter: Secondary | ICD-10-CM | POA: Diagnosis not present

## 2019-12-26 DIAGNOSIS — Z789 Other specified health status: Secondary | ICD-10-CM | POA: Diagnosis not present

## 2019-12-26 DIAGNOSIS — Z7409 Other reduced mobility: Secondary | ICD-10-CM | POA: Diagnosis not present

## 2019-12-26 DIAGNOSIS — M25559 Pain in unspecified hip: Secondary | ICD-10-CM | POA: Diagnosis not present

## 2019-12-26 DIAGNOSIS — R2689 Other abnormalities of gait and mobility: Secondary | ICD-10-CM | POA: Diagnosis not present

## 2019-12-26 DIAGNOSIS — M8448XD Pathological fracture, other site, subsequent encounter for fracture with routine healing: Secondary | ICD-10-CM | POA: Diagnosis not present

## 2019-12-26 DIAGNOSIS — Z9181 History of falling: Secondary | ICD-10-CM | POA: Diagnosis not present

## 2020-01-02 DIAGNOSIS — R2689 Other abnormalities of gait and mobility: Secondary | ICD-10-CM | POA: Diagnosis not present

## 2020-01-02 DIAGNOSIS — Z7409 Other reduced mobility: Secondary | ICD-10-CM | POA: Diagnosis not present

## 2020-01-02 DIAGNOSIS — Z789 Other specified health status: Secondary | ICD-10-CM | POA: Diagnosis not present

## 2020-01-02 DIAGNOSIS — Z9181 History of falling: Secondary | ICD-10-CM | POA: Diagnosis not present

## 2020-01-02 DIAGNOSIS — M25559 Pain in unspecified hip: Secondary | ICD-10-CM | POA: Diagnosis not present

## 2020-01-02 DIAGNOSIS — M8448XD Pathological fracture, other site, subsequent encounter for fracture with routine healing: Secondary | ICD-10-CM | POA: Diagnosis not present

## 2020-01-05 ENCOUNTER — Ambulatory Visit (HOSPITAL_BASED_OUTPATIENT_CLINIC_OR_DEPARTMENT_OTHER)
Admission: RE | Admit: 2020-01-05 | Discharge: 2020-01-05 | Disposition: A | Discharge status: Home / Self Care | Payer: PPO | Source: Ambulatory Visit | Attending: Cardiology | Admitting: Cardiology

## 2020-01-05 ENCOUNTER — Telehealth: Payer: Self-pay

## 2020-01-05 DIAGNOSIS — M79604 Pain in right leg: Secondary | ICD-10-CM | POA: Diagnosis not present

## 2020-01-05 DIAGNOSIS — M79605 Pain in left leg: Secondary | ICD-10-CM | POA: Diagnosis not present

## 2020-01-05 NOTE — Telephone Encounter (Signed)
-----   Message from Richardo Priest, MD sent at 01/05/2020  3:24 PM EDT ----- Good result ABIs are normal does not have significant blockage in lower extremities

## 2020-01-05 NOTE — Telephone Encounter (Signed)
Spoke with patient regarding results and recommendation.  Patient verbalizes understanding and is agreeable to plan of care. Advised patient to call back with any issues or concerns.  

## 2020-01-08 ENCOUNTER — Telehealth: Payer: Self-pay

## 2020-01-08 NOTE — Telephone Encounter (Signed)
-----   Message from Richardo Priest, MD sent at 01/07/2020 11:05 AM EDT ----- Her ABIs are normal.

## 2020-01-08 NOTE — Telephone Encounter (Signed)
Spoke with patient regarding results and recommendation.  Patient verbalizes understanding and is agreeable to plan of care. Advised patient to call back with any issues or concerns.  

## 2020-01-09 DIAGNOSIS — Z789 Other specified health status: Secondary | ICD-10-CM | POA: Diagnosis not present

## 2020-01-09 DIAGNOSIS — M8448XD Pathological fracture, other site, subsequent encounter for fracture with routine healing: Secondary | ICD-10-CM | POA: Diagnosis not present

## 2020-01-09 DIAGNOSIS — M25559 Pain in unspecified hip: Secondary | ICD-10-CM | POA: Diagnosis not present

## 2020-01-09 DIAGNOSIS — Z9181 History of falling: Secondary | ICD-10-CM | POA: Diagnosis not present

## 2020-01-09 DIAGNOSIS — Z7409 Other reduced mobility: Secondary | ICD-10-CM | POA: Diagnosis not present

## 2020-01-09 DIAGNOSIS — R2689 Other abnormalities of gait and mobility: Secondary | ICD-10-CM | POA: Diagnosis not present

## 2020-01-12 DIAGNOSIS — J449 Chronic obstructive pulmonary disease, unspecified: Secondary | ICD-10-CM | POA: Diagnosis not present

## 2020-01-12 DIAGNOSIS — J432 Centrilobular emphysema: Secondary | ICD-10-CM | POA: Diagnosis not present

## 2020-01-15 ENCOUNTER — Encounter (HOSPITAL_BASED_OUTPATIENT_CLINIC_OR_DEPARTMENT_OTHER): Payer: PPO

## 2020-01-16 DIAGNOSIS — M8448XD Pathological fracture, other site, subsequent encounter for fracture with routine healing: Secondary | ICD-10-CM | POA: Diagnosis not present

## 2020-01-16 DIAGNOSIS — Z9181 History of falling: Secondary | ICD-10-CM | POA: Diagnosis not present

## 2020-01-16 DIAGNOSIS — M25559 Pain in unspecified hip: Secondary | ICD-10-CM | POA: Diagnosis not present

## 2020-01-16 DIAGNOSIS — Z7409 Other reduced mobility: Secondary | ICD-10-CM | POA: Diagnosis not present

## 2020-01-16 DIAGNOSIS — Z789 Other specified health status: Secondary | ICD-10-CM | POA: Diagnosis not present

## 2020-01-16 DIAGNOSIS — R2689 Other abnormalities of gait and mobility: Secondary | ICD-10-CM | POA: Diagnosis not present

## 2020-01-23 DIAGNOSIS — Z7409 Other reduced mobility: Secondary | ICD-10-CM | POA: Diagnosis not present

## 2020-01-23 DIAGNOSIS — Z789 Other specified health status: Secondary | ICD-10-CM | POA: Diagnosis not present

## 2020-01-23 DIAGNOSIS — M25511 Pain in right shoulder: Secondary | ICD-10-CM | POA: Diagnosis not present

## 2020-01-23 DIAGNOSIS — M25559 Pain in unspecified hip: Secondary | ICD-10-CM | POA: Diagnosis not present

## 2020-01-23 DIAGNOSIS — R2689 Other abnormalities of gait and mobility: Secondary | ICD-10-CM | POA: Diagnosis not present

## 2020-01-23 DIAGNOSIS — M8448XD Pathological fracture, other site, subsequent encounter for fracture with routine healing: Secondary | ICD-10-CM | POA: Diagnosis not present

## 2020-01-23 DIAGNOSIS — Z9181 History of falling: Secondary | ICD-10-CM | POA: Diagnosis not present

## 2020-01-23 DIAGNOSIS — G8929 Other chronic pain: Secondary | ICD-10-CM | POA: Diagnosis not present

## 2020-01-30 DIAGNOSIS — E538 Deficiency of other specified B group vitamins: Secondary | ICD-10-CM | POA: Insufficient documentation

## 2020-01-30 DIAGNOSIS — J439 Emphysema, unspecified: Secondary | ICD-10-CM | POA: Insufficient documentation

## 2020-01-30 DIAGNOSIS — Z789 Other specified health status: Secondary | ICD-10-CM

## 2020-01-30 DIAGNOSIS — J309 Allergic rhinitis, unspecified: Secondary | ICD-10-CM | POA: Insufficient documentation

## 2020-01-30 DIAGNOSIS — R2689 Other abnormalities of gait and mobility: Secondary | ICD-10-CM

## 2020-01-30 DIAGNOSIS — M25559 Pain in unspecified hip: Secondary | ICD-10-CM | POA: Diagnosis not present

## 2020-01-30 DIAGNOSIS — T8859XA Other complications of anesthesia, initial encounter: Secondary | ICD-10-CM | POA: Insufficient documentation

## 2020-01-30 DIAGNOSIS — M8448XD Pathological fracture, other site, subsequent encounter for fracture with routine healing: Secondary | ICD-10-CM | POA: Diagnosis not present

## 2020-01-30 DIAGNOSIS — I10 Essential (primary) hypertension: Secondary | ICD-10-CM | POA: Insufficient documentation

## 2020-01-30 DIAGNOSIS — Z7409 Other reduced mobility: Secondary | ICD-10-CM

## 2020-01-30 DIAGNOSIS — Z9181 History of falling: Secondary | ICD-10-CM | POA: Diagnosis not present

## 2020-01-30 DIAGNOSIS — N390 Urinary tract infection, site not specified: Secondary | ICD-10-CM | POA: Insufficient documentation

## 2020-01-30 DIAGNOSIS — M810 Age-related osteoporosis without current pathological fracture: Secondary | ICD-10-CM | POA: Insufficient documentation

## 2020-01-30 DIAGNOSIS — Z23 Encounter for immunization: Secondary | ICD-10-CM | POA: Diagnosis not present

## 2020-01-30 DIAGNOSIS — K219 Gastro-esophageal reflux disease without esophagitis: Secondary | ICD-10-CM | POA: Insufficient documentation

## 2020-01-30 HISTORY — DX: Other reduced mobility: Z74.09

## 2020-01-30 HISTORY — DX: Other specified health status: Z78.9

## 2020-01-30 HISTORY — DX: Other abnormalities of gait and mobility: R26.89

## 2020-01-31 ENCOUNTER — Encounter: Payer: Self-pay | Admitting: Cardiology

## 2020-01-31 ENCOUNTER — Other Ambulatory Visit: Payer: Self-pay

## 2020-01-31 ENCOUNTER — Ambulatory Visit: Payer: PPO | Admitting: Cardiology

## 2020-01-31 VITALS — BP 125/75 | HR 79 | Ht 65.5 in | Wt 170.0 lb

## 2020-01-31 DIAGNOSIS — I10 Essential (primary) hypertension: Secondary | ICD-10-CM | POA: Diagnosis not present

## 2020-01-31 DIAGNOSIS — R6889 Other general symptoms and signs: Secondary | ICD-10-CM

## 2020-01-31 DIAGNOSIS — I479 Paroxysmal tachycardia, unspecified: Secondary | ICD-10-CM | POA: Diagnosis not present

## 2020-01-31 DIAGNOSIS — J449 Chronic obstructive pulmonary disease, unspecified: Secondary | ICD-10-CM | POA: Diagnosis not present

## 2020-01-31 DIAGNOSIS — R209 Unspecified disturbances of skin sensation: Secondary | ICD-10-CM

## 2020-01-31 NOTE — Progress Notes (Signed)
Cardiology Office Note:    Date:  01/31/2020   ID:  Virginia Burns, DOB July 30, 1945, MRN [id number]  PCP:  Berkley Harvey, NP  Cardiologist:  Shirlee More, MD    Referring MD: Berkley Harvey, NP    ASSESSMENT:    1. Cold extremities   2. Essential hypertension   3. COPD (chronic obstructive pulmonary disease) with chronic bronchitis ([place])   4. Tachycardia, paroxysmal (HCC)    PLAN:    In order of problems listed above:  1. Fortunately she has no evidence of obstructive PAD and I do not think she needs further vascular testing.  I reviewed the test results with her 2. Stable at target continue calcium channel blocker avoid beta-blockers with COPD 3. Stable she is functionally improving and is doing better with ambulatory oxygen.  She will continue current treatment oxygen bronchodilators directed by her PCP 4. Sinus tachycardia related to deconditioning COPD given reassurance  Next appointment: As needed   Medication Adjustments/Labs and Tests Ordered: Current medicines are reviewed at length with the patient today.  Concerns regarding medicines are outlined above.  Orders Placed This Encounter  Procedures  . EKG 12-Lead   No orders of the defined types were placed in this encounter.   Chief Complaint  Patient presents with  . Follow-up    After ABI  . Tachycardia    History of Present Illness:    Virginia Burns is a 74 y.o. female with a hx of hypertension sleep apnea COPD previously seen for concerns of murmur and palpitation last seen 12/20/2019 referred by podiatry with recommendations for ABI.  Okay she 18. Compliance with diet, lifestyle and medications: Yes  I reviewed her results with her.  She does not have peripheral arterial disease.  I think the discolorations are due to her COPD.  She tells me when she does her PT her heart rate will accelerate to the 130s she does not have arrhythmia I think this is reflux due to deconditioning and COPD we  discussed putting her on a rate limiting calcium channel blocker she defers and I agree with her.  She is slowly and steadily improving she has no claudication chest pain.  Her EKG done today shows sinus rhythm poor R wave progression  Echo 07/18/2018: 1. The left ventricle has normal systolic function with an ejection  fraction of 60-65%. The cavity size was normal. Left ventricular diastolic  Doppler parameters are consistent with impaired relaxation.  2. The right ventricle has normal systolic function. The cavity was  normal. There is no increase in right ventricular wall thickness.  3. The mitral valve is normal in structure.  4. The tricuspid valve is normal in structure.  5. The aortic valve is normal in structure.  6. The pulmonic valve was normal in structure.  7. 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 No  evidence of significant right lower extremity arterial disease. The right  toe-brachial index is abnormal.   Left: Resting left ankle-brachial index is within normal range. No  evidence of significant left lower extremity arterial disease. The left  toe-brachial index is normal.  Past Medical History:  Diagnosis Date  . Adjustment disorder with anxiety 08/17/2013  . Allergic rhinitis   . Anxiety state 08/17/2013   Overview:  IMPRESSION: hx of anxiety, lost a friend and has friend in ICU, using the ativan prn. had refilled  07/27/13  . Aortic ejection murmur 07/14/2018  . Benign neoplasm of ascending colon 12/07/2014  . Benign neoplasm of descending colon 12/07/2014  . Centrilobular emphysema ([place]) 12/07/2014  . Chronic hyponatremia 10/05/2018   Last Assessment & Plan:  Formatting of this note is different from the original. Recent Labs    10/05/18 0007 10/06/18 0504 10/07/18 0733  NA 132* 131* 135   Baseline ~ 132   -stable  . Chronic respiratory failure ([place])  03/04/2015  . Closed fracture of sacrum and coccyx ([place]) 10/03/2018   Last Assessment & Plan:  Formatting of this note might be different from the original. Patient presented with progressively worsening lower back pain and inability to ambulate  MR: Acute fracture of the right greater than left sacral ala and S2 segment. CT: Nondisplaced, vertical and transverse acute sacral insufficiency Fractures  -s/p perQ fixation on 5/20  --Management per ortho primary team: P  . Complication of anesthesia    Versed- hard time working - "dinging for days" - Colonoscopy  . Constipation 12/09/2012   Formatting of this note might be different from the original. IMPRESSION: increased constipation: trial of amitiza, to push fiber, can use mirlax prn.  eat 4 prunes a day. call prn. has had colonoscopy in past. is due next year for f/u  . COPD (chronic obstructive pulmonary disease) ([place]) 02/10/2013  . COPD (chronic obstructive pulmonary disease) with chronic bronchitis ([place]) 02/10/2013   Overnight pulse oximetry shows desaturations greater than 5 minutes at a time less than 88%.  Order signed for nocturnal O2 at 2 L/m February 21, 2016  Overview:  Overnight pulse oximetry shows desaturations greater than 5 minutes at a time less than 88%.  Order signed for nocturnal O2 at 2 L/m February 21, 2016 Overview:  Overnight pulse oximetry shows desaturations greater than 5 minutes at a time l  . COPD (chronic obstructive pulmonary disease) with emphysema ([place])   . Decreased mobility 01/30/2020  . 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 therapy 12/07/2014  . Diverticulosis of colon 10/12/2007   Qualifier: Diagnosis of  By: Nils Pyle CMA (AAMA), Mearl Latin    . Essential tremor 05/22/2013  . Gastroparesis   . GERD (gastroesophageal reflux disease)   . GERD with esophagitis 10/12/2007   Qualifier: Diagnosis of  By: Nils Pyle CMA (AAMA), Mearl Latin    Overview:  Overview:  Qualifier: Diagnosis of  By: Nils Pyle CMA Deborra Medina), Mearl Latin  Overview:  Overview:  Qualifier:  Diagnosis of  By: Nils Pyle CMA (Deborra Medina), Mearl Latin   . History of tobacco abuse 09/18/2013  . Humerus distal fracture 01/24/2018  . Hx of colonic polyps   . Hypertension   . Hypertension, benign 07/19/2007   Qualifier: Diagnosis of  By: Garen Grams    Overview:  Overview:  STORY: The goal for blood pressure is less than 140/90.  If you are checking your blood pressure at home, please record it and bring it to your next office visit. Following the Dietary Approaches to Stop Hypertension (DASH) diet (3 servings of fruit and vegetables daily, whole grains, low sodium, low-fat proteins) and exercisin  . Impaired mobility and ADLs 01/30/2020  . Inability to walk 10/03/2018  . Insomnia 10/22/2012  . Intractable low back pain 10/03/2018  . Multinodular goiter 06/01/2017  . 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 Obstructive sleep apnea (adult) (pediatric) 09/18/2013   Overview:  Last Assessment & Plan:  She has mild sleep apnea.  I have reviewed the recent sleep study results with the patient.  We  discussed how sleep apnea can affect various health problems including risks for hypertension, cardiovascular disease, and diabetes.  We also discussed how sleep disruption can increase risks for accident, such as while driving.  Weight loss as a means of improving sl  . OSA (obstructive sleep apnea) 09/18/2013  . Osteoarthrosis 12/09/2012  . Osteopenia of multiple sites 10/12/2007   Qualifier: Diagnosis of  By: Nils Pyle CMA ([place]), Mearl Latin    . Osteoporosis   . Palpitations 07/14/2018  . Personal history of colonic polyps 10/07/2009   tubular adenoma  . Tachycardia, paroxysmal ([place]) 07/04/2018  . Urinary tract infection   . Vitamin B12 deficiency   . Vitamin D deficiency 12/09/2012   Overview:  Overview:  IMPRESSION: Appropiate labs done today. We will send the results and adjust treatment as needed. Bone density discussed. There has  been an improvement in her bone density comparing the one in 2009 from the one 2011. From  osteoporotic she went to osteopenic. She did take Actonel 3 years ago for several years and stopped it due to GI side effects. We will continue mantaining f    Past Surgical History:  Procedure Laterality Date  . ABDOMINAL HYSTERECTOMY    . ABDOMINAL HYSTERECTOMY    . CATARACT EXTRACTION Right   . CHOLECYSTECTOMY    . COLONOSCOPY WITH PROPOFOL N/A 11/21/2014   Procedure: COLONOSCOPY WITH PROPOFOL;  Surgeon: Irene Shipper, MD;  Location: [facility];  Service: Endoscopy;  Laterality: N/A;  . FRACTURE SURGERY    . [place]  . LIGAMENT REPAIR Left 04/18/2013   Procedure: Triangular Fibrocartilage complex open repair;  Surgeon: Jolyn Nap, MD;  Location: [facility];  Service: Orthopedics;  Laterality: Left;  . OOPHORECTOMY    . OPEN REDUCTION INTERNAL FIXATION (ORIF) DISTAL RADIAL FRACTURE Left 04/18/2013   Procedure: LEFT OPEN REDUCTION INTERNAL FIXATION (ORIF) DISTAL RADIAL FRACTURE;  Surgeon: Jolyn Nap, MD;  Location: [facility];  Service: Orthopedics;  Laterality: Left;  . ORIF ELBOW FRACTURE Left 01/24/2018   Procedure: OPEN REDUCTION INTERNAL FIXATION (ORIF) DISTAL HUMERUS  ELBOW/OLECRANON OSTEOTOMY FRACTURE;  Surgeon: [name], MD;  Location: [facility];  Service: Orthopedics;  Laterality: Left;    Current Medications: Current Meds  Medication Sig  . albuterol (PROAIR HFA) 108 (90 BASE) MCG/ACT inhaler Inhale 2 puffs into the lungs every 6 (six) hours as needed for wheezing or shortness of breath.  Marland Marcucci albuterol (PROVENTIL) (2.5 MG/3ML) 0.083% nebulizer solution Inhale into the lungs every 6 (six) hours as needed for wheezing or shortness of breath.   . AMBULATORY NON FORMULARY MEDICATION Take 10 mg by mouth 3 (three) times daily before meals. Medication Name: Domperidone  . amLODipine (NORVASC) 5 MG tablet Take 5 mg by mouth daily.   Marland Dobratz aspirin EC 81 MG tablet Take 81 mg by mouth every other day.  . budesonide-formoterol (SYMBICORT)  160-4.5 MCG/ACT inhaler Inhale 2 puffs into the lungs 2 (two) times daily.  . calcium carbonate (OS-CAL - DOSED IN MG OF ELEMENTAL CALCIUM) 1250 (500 Ca) MG tablet Take 1 tablet by mouth. 600mg  bid  . Cholecalciferol 125 MCG (5000 UT) TABS Take by mouth.  . denosumab (PROLIA) 60 MG/ML SOSY injection Inject into the skin.  Marland Bojarski gabapentin (NEURONTIN) 600 MG tablet Take 0.5 tablets by mouth 3 (three) times daily.  Marland Johns LORazepam (ATIVAN) 0.5 MG tablet Take 0.5 mg by mouth as needed for anxiety.   . magic mouthwash SOLN Take 5 mLs by  mouth 3 (three) times daily.  . pantoprazole (PROTONIX) 40 MG tablet Take 40 mg by mouth 2 (two) times daily.   . Probiotic CAPS Take 1 capsule by mouth daily.  . sodium chloride irrigation 0.9 % irrigation daily as needed.  . sucralfate (CARAFATE) 1 GM/10ML suspension TAKE 2 TEASPOONFUL (10 MLS) BY MOUTH  4 TIMES DAILY WITH MEALS AND AT BEDTIME  . tiotropium (SPIRIVA) 18 MCG inhalation capsule Place 1 capsule (18 mcg total) into inhaler and inhale daily.     Allergies:   Clindamycin/lincomycin, Bactrim [sulfamethoxazole-trimethoprim], and Sulfur   Social History   Socioeconomic History  . Marital status: Single    Spouse name: Not on file  . Number of children: 1  . Years of education: Not on file  . Highest education level: Not on file  Occupational History  . Occupation: Architectural technologist: [employer]  Tobacco Use  . Smoking status: Former Smoker    Packs/day: 1.00    Years: 25.00    Pack years: 25.00    Types: Cigarettes    Quit date: 05/18/2009    Years since quitting: 10.7  . Smokeless tobacco: Never Used  Vaping Use  . Vaping Use: Never used  Substance and Sexual Activity  . Alcohol use: Yes    Alcohol/week: 2.0 standard drinks    Types: 2 Glasses of wine per week    Comment: 2 glasses of wine each night  . Drug use: No  . Sexual activity: Not on file  Other Topics Concern  . 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    She lives with son.  She is divorced.         Social Determinants of Health   Financial Resource Strain:   . Difficulty of Paying Living Expenses: Not on file  Food Insecurity:   . Worried About Charity fundraiser in the Last Year: Not on file  . Ran Out of Food in the Last Year: Not on file  Transportation Needs:   . Lack of Transportation (Medical): Not on file  . Lack of Transportation (Non-Medical): Not on file  Physical Activity:   . Days of Exercise per Week: Not on file  . Minutes of Exercise per Session: Not on file  Stress:   . Feeling of Stress : Not on file  Social Connections:   . Frequency of Communication with Friends and Family: Not on file  . Frequency of Social Gatherings with Friends and Family: Not on file  . Attends Religious Services: Not on file  . Active Member of Clubs or Organizations: Not on file  . Attends Archivist Meetings: Not on file  . Marital Status: Not on file     Family History: The patient's family history includes COPD in her mother; Colitis in her son; Colon cancer in her cousin; Diabetes in her brother; Diabetes type II in her son; Emphysema in her mother; Other in her father; Tremor in her maternal aunt and mother. ROS:   Please see the history of present illness.    All other systems reviewed and are negative.  EKGs/Labs/Other Studies Reviewed:    The following studies were reviewed today:  EKG:  EKG ordered today and personally reviewed.  The ekg ordered today demonstrates sinus rhythm poor R wave progression    Physical Exam:    VS:  BP 125/75   Pulse 79  Ht 5' 5.5" (1.664 m)   Wt 170 lb (77.1 kg)   SpO2 98% Comment: 2 liters [place]  BMI 27.86 kg/m     Wt Readings from Last 3 Encounters:  01/31/20 170 lb (77.1 kg)  12/20/19 166 lb (75.3 kg)  12/01/19 167 lb (75.8 kg)     GEN: She looks stronger well nourished, well developed in no acute distress HEENT: Normal NECK:  No JVD; No carotid bruits LYMPHATICS: No lymphadenopathy CARDIAC: RRR, no murmurs, rubs, gallops RESPIRATORY:  Clear to auscultation without rales, wheezing or rhonchi  ABDOMEN: Soft, non-tender, non-distended MUSCULOSKELETAL:  No edema; No deformity  SKIN: Warm and dry NEUROLOGIC:  Alert and oriented x 3 PSYCHIATRIC:  Normal affect    Signed, Shirlee More, MD  01/31/2020 11:47 AM    [place]

## 2020-01-31 NOTE — Patient Instructions (Signed)

## 2020-02-06 DIAGNOSIS — M25559 Pain in unspecified hip: Secondary | ICD-10-CM | POA: Diagnosis not present

## 2020-02-09 DIAGNOSIS — Z9181 History of falling: Secondary | ICD-10-CM | POA: Diagnosis not present

## 2020-02-13 DIAGNOSIS — Z9181 History of falling: Secondary | ICD-10-CM | POA: Diagnosis not present

## 2020-02-13 DIAGNOSIS — R2689 Other abnormalities of gait and mobility: Secondary | ICD-10-CM | POA: Diagnosis not present

## 2020-02-13 DIAGNOSIS — M25559 Pain in unspecified hip: Secondary | ICD-10-CM | POA: Diagnosis not present

## 2020-02-13 DIAGNOSIS — Z7189 Other specified counseling: Secondary | ICD-10-CM | POA: Diagnosis not present

## 2020-02-13 DIAGNOSIS — E559 Vitamin D deficiency, unspecified: Secondary | ICD-10-CM | POA: Diagnosis not present

## 2020-02-13 DIAGNOSIS — Z7409 Other reduced mobility: Secondary | ICD-10-CM | POA: Diagnosis not present

## 2020-02-13 DIAGNOSIS — M8448XD Pathological fracture, other site, subsequent encounter for fracture with routine healing: Secondary | ICD-10-CM | POA: Diagnosis not present

## 2020-02-13 DIAGNOSIS — M818 Other osteoporosis without current pathological fracture: Secondary | ICD-10-CM | POA: Diagnosis not present

## 2020-02-13 DIAGNOSIS — Z789 Other specified health status: Secondary | ICD-10-CM | POA: Diagnosis not present

## 2020-03-18 DIAGNOSIS — H26493 Other secondary cataract, bilateral: Secondary | ICD-10-CM | POA: Diagnosis not present

## 2020-03-19 DIAGNOSIS — Z789 Other specified health status: Secondary | ICD-10-CM | POA: Diagnosis not present

## 2020-03-19 DIAGNOSIS — R2689 Other abnormalities of gait and mobility: Secondary | ICD-10-CM | POA: Diagnosis not present

## 2020-03-19 DIAGNOSIS — M25559 Pain in unspecified hip: Secondary | ICD-10-CM | POA: Diagnosis not present

## 2020-03-19 DIAGNOSIS — M8448XD Pathological fracture, other site, subsequent encounter for fracture with routine healing: Secondary | ICD-10-CM | POA: Diagnosis not present

## 2020-03-19 DIAGNOSIS — Z9181 History of falling: Secondary | ICD-10-CM | POA: Diagnosis not present

## 2020-03-19 DIAGNOSIS — Z7409 Other reduced mobility: Secondary | ICD-10-CM | POA: Diagnosis not present

## 2020-03-26 DIAGNOSIS — Z789 Other specified health status: Secondary | ICD-10-CM | POA: Diagnosis not present

## 2020-03-26 DIAGNOSIS — M8448XD Pathological fracture, other site, subsequent encounter for fracture with routine healing: Secondary | ICD-10-CM | POA: Diagnosis not present

## 2020-03-26 DIAGNOSIS — Z9181 History of falling: Secondary | ICD-10-CM | POA: Diagnosis not present

## 2020-03-26 DIAGNOSIS — M25559 Pain in unspecified hip: Secondary | ICD-10-CM | POA: Diagnosis not present

## 2020-03-26 DIAGNOSIS — R2689 Other abnormalities of gait and mobility: Secondary | ICD-10-CM | POA: Diagnosis not present

## 2020-03-26 DIAGNOSIS — Z7409 Other reduced mobility: Secondary | ICD-10-CM | POA: Diagnosis not present

## 2020-03-27 DIAGNOSIS — M810 Age-related osteoporosis without current pathological fracture: Secondary | ICD-10-CM | POA: Diagnosis not present

## 2020-03-27 DIAGNOSIS — E042 Nontoxic multinodular goiter: Secondary | ICD-10-CM | POA: Diagnosis not present

## 2020-03-27 DIAGNOSIS — Z9981 Dependence on supplemental oxygen: Secondary | ICD-10-CM | POA: Diagnosis not present

## 2020-03-27 DIAGNOSIS — E538 Deficiency of other specified B group vitamins: Secondary | ICD-10-CM | POA: Diagnosis not present

## 2020-03-27 DIAGNOSIS — J449 Chronic obstructive pulmonary disease, unspecified: Secondary | ICD-10-CM | POA: Diagnosis not present

## 2020-03-27 DIAGNOSIS — Z0001 Encounter for general adult medical examination with abnormal findings: Secondary | ICD-10-CM | POA: Diagnosis not present

## 2020-03-27 DIAGNOSIS — Z87891 Personal history of nicotine dependence: Secondary | ICD-10-CM | POA: Diagnosis not present

## 2020-03-27 DIAGNOSIS — G629 Polyneuropathy, unspecified: Secondary | ICD-10-CM | POA: Diagnosis not present

## 2020-03-27 DIAGNOSIS — Z0000 Encounter for general adult medical examination without abnormal findings: Secondary | ICD-10-CM | POA: Diagnosis not present

## 2020-03-27 DIAGNOSIS — I10 Essential (primary) hypertension: Secondary | ICD-10-CM | POA: Diagnosis not present

## 2020-04-02 DIAGNOSIS — R2689 Other abnormalities of gait and mobility: Secondary | ICD-10-CM | POA: Diagnosis not present

## 2020-04-02 DIAGNOSIS — Z7409 Other reduced mobility: Secondary | ICD-10-CM | POA: Diagnosis not present

## 2020-04-02 DIAGNOSIS — M25559 Pain in unspecified hip: Secondary | ICD-10-CM | POA: Diagnosis not present

## 2020-04-02 DIAGNOSIS — Z9181 History of falling: Secondary | ICD-10-CM | POA: Diagnosis not present

## 2020-04-02 DIAGNOSIS — M8448XD Pathological fracture, other site, subsequent encounter for fracture with routine healing: Secondary | ICD-10-CM | POA: Diagnosis not present

## 2020-04-02 DIAGNOSIS — Z789 Other specified health status: Secondary | ICD-10-CM | POA: Diagnosis not present

## 2020-04-09 DIAGNOSIS — Z789 Other specified health status: Secondary | ICD-10-CM | POA: Diagnosis not present

## 2020-04-09 DIAGNOSIS — M25559 Pain in unspecified hip: Secondary | ICD-10-CM | POA: Diagnosis not present

## 2020-04-09 DIAGNOSIS — R2689 Other abnormalities of gait and mobility: Secondary | ICD-10-CM | POA: Diagnosis not present

## 2020-04-09 DIAGNOSIS — Z7409 Other reduced mobility: Secondary | ICD-10-CM | POA: Diagnosis not present

## 2020-04-09 DIAGNOSIS — Z9181 History of falling: Secondary | ICD-10-CM | POA: Diagnosis not present

## 2020-04-09 DIAGNOSIS — M8448XD Pathological fracture, other site, subsequent encounter for fracture with routine healing: Secondary | ICD-10-CM | POA: Diagnosis not present

## 2020-04-16 DIAGNOSIS — Z7409 Other reduced mobility: Secondary | ICD-10-CM | POA: Diagnosis not present

## 2020-04-16 DIAGNOSIS — M8448XD Pathological fracture, other site, subsequent encounter for fracture with routine healing: Secondary | ICD-10-CM | POA: Diagnosis not present

## 2020-04-16 DIAGNOSIS — Z789 Other specified health status: Secondary | ICD-10-CM | POA: Diagnosis not present

## 2020-04-16 DIAGNOSIS — R2689 Other abnormalities of gait and mobility: Secondary | ICD-10-CM | POA: Diagnosis not present

## 2020-04-16 DIAGNOSIS — M25559 Pain in unspecified hip: Secondary | ICD-10-CM | POA: Diagnosis not present

## 2020-04-16 DIAGNOSIS — Z9181 History of falling: Secondary | ICD-10-CM | POA: Diagnosis not present

## 2020-04-24 DIAGNOSIS — E538 Deficiency of other specified B group vitamins: Secondary | ICD-10-CM | POA: Diagnosis not present

## 2020-04-29 DIAGNOSIS — M65872 Other synovitis and tenosynovitis, left ankle and foot: Secondary | ICD-10-CM | POA: Diagnosis not present

## 2020-04-29 DIAGNOSIS — M65871 Other synovitis and tenosynovitis, right ankle and foot: Secondary | ICD-10-CM | POA: Diagnosis not present

## 2020-04-29 DIAGNOSIS — B351 Tinea unguium: Secondary | ICD-10-CM | POA: Diagnosis not present

## 2020-04-29 DIAGNOSIS — I739 Peripheral vascular disease, unspecified: Secondary | ICD-10-CM | POA: Diagnosis not present

## 2020-05-26 ENCOUNTER — Telehealth: Payer: Self-pay | Admitting: Pulmonary Disease

## 2020-05-27 ENCOUNTER — Ambulatory Visit: Payer: PPO | Admitting: Pulmonary Disease

## 2020-05-27 NOTE — Telephone Encounter (Signed)
Spoke with pt. She states that her PCP and her physical therapist think she may need a PFT done due to her oxygen sats dropping some and heart rate going up during her PT. Pt reports that she wears her oxygen at night and occas in the car when she goes out and also at PT. I advised pt that I would check with Dr Halford Chessman to see what tests she may need (PFT or 6MW) and would get back with her . Pt states she is ok with scheduling f/u with a NP since Dr Halford Chessman dosent have any open appts until the end of February. PT is aware that Dr Halford Chessman is out of the office and she should receive a call back tomorrow.  Dr Halford Chessman please advsie.

## 2020-05-28 NOTE — Telephone Encounter (Signed)
Would make more sense to arrange for 6 minute walk test to determine her O2 needs.  Please schedule this. I don't think she needs a PFT at this time.

## 2020-05-28 NOTE — Telephone Encounter (Signed)
Called and spoke with pt letting her know that VS said pt needs to have a walk test done not a PFT so we can see if she needs to have o2 during the day. Pt verbalized understanding. Pt has been scheduled for an OV with Beth and have stated in notes that pt needs to have a walk test performed during that visit to see if she qualifies for O2 with exertion (pt stated that she already wears o2 at night). Nothing further needed.

## 2020-06-03 ENCOUNTER — Ambulatory Visit: Payer: PPO | Admitting: Primary Care

## 2020-06-05 ENCOUNTER — Ambulatory Visit: Payer: PPO | Admitting: Primary Care

## 2020-06-11 NOTE — Progress Notes (Signed)
@Patient  ID: Virginia Burns, female    DOB: 1946-01-11, 75 y.o.   MRN: [id number]  Chief Complaint  Patient presents with  . Shortness of Breath    Increased dyspnea and increased heart rate with minimal activity    Referring provider: Berkley Harvey, NP  HPI: 75 year old female, former smoker. PMH significant for emphysema, COPD, chronic respiratory failure, HTN, tachycardia, OSA, GERD, multinodular goiter, colon polyps, diverticulosis. Patient of Dr. Halford Chessman, last seen on 12/01/19. Recommended patient be started on Breztri. She is on nocturnal oxygen.  06/12/2020 Patient presents today for 6 month follow-up. She reports increase dyspnea and tachycardia on exertion over the last 5 months. She gets winded with minimal activity. Occasional wheezing. She does not have a cough. She wears 2L oxygen at night. Lately she has been requiring oxygen during the day on exertion. She has a POC which she has had for over a year. States that Symbicort and Spiriva work well for her. She did not like Breztri. She uses her rescue inhaler once a week on average. She completed physical therapy, they want her to return in February. She saw cardiology in September 2021 for tachycardia. No evidence of obstructive PSA. He felt tachycardia was related to deconditioning and COPD.     Pulmonary testing:  PFT 08/09/07>>FEV1 1.54 (63%), FEV1% 53, TLC 3.42 (65%), DLCO 64%, no BD  PFT 02/10/13>>FEV1 1.32 (52%), FEV1% 58, TLC 5.07 (96%), DLCO 60%, no BD  Sleep testing:  PSG 10/24/13>>AHI 6.8, SaO2 low 84%, Spent 28.3 min with SaO2 <90%.  Allergies  Allergen Reactions  . Clindamycin/Lincomycin Hives  . Bactrim [Sulfamethoxazole-Trimethoprim] Swelling    swelling  . Elemental Sulfur Rash    Immunization History  Administered Date(s) Administered  . Fluad Quad(high Dose 65+) 02/01/2019  . Influenza Split 02/27/2009, 02/18/2010, 03/24/2011, 02/05/2012, 02/15/2013, 02/28/2013, 02/23/2014, 03/18/2015,  02/20/2016  . Influenza Whole 02/16/2012  . Influenza, High Dose Seasonal PF 02/28/2013, 02/20/2016, 03/05/2017, 03/18/2018, 02/01/2019, 01/30/2020  . Influenza,inj,Quad PF,6+ Mos 03/18/2015  . Influenza,inj,quad, With Preservative 02/23/2014, 03/18/2015  . Influenza-Unspecified 02/20/2016  . PFIZER(Purple Top)SARS-COV-2 Vaccination 06/30/2019, 07/21/2019, 03/27/2020  . Pneumococcal Conjugate-13 01/10/2008, 03/07/2013  . Pneumococcal Polysaccharide-23 03/02/2016  . Pneumococcal-Unspecified 01/10/2008  . Tdap 07/23/2009, 01/23/2019  . Zoster 01/09/2010    Past Medical History:  Diagnosis Date  . Adjustment disorder with anxiety 08/17/2013  . Allergic rhinitis   . Anxiety state 08/17/2013   Overview:  IMPRESSION: hx of anxiety, lost a friend and has friend in ICU, using the ativan prn. had refilled 07/27/13  . Aortic ejection murmur 07/14/2018  . Benign neoplasm of ascending colon 12/07/2014  . Benign neoplasm of descending colon 12/07/2014  . Centrilobular emphysema ([place]) 12/07/2014  . Chronic hyponatremia 10/05/2018   Last Assessment & Plan:  Formatting of this note is different from the original. Recent Labs    10/05/18 0007 10/06/18 0504 10/07/18 0733  NA 132* 131* 135   Baseline ~ 132   -stable  . Chronic respiratory failure ([place]) 03/04/2015  . Closed fracture of sacrum and coccyx ([place]) 10/03/2018   Last Assessment & Plan:  Formatting of this note might be different from the original. Patient presented with progressively worsening lower back pain and inability to ambulate  MR: Acute fracture of the right greater than left sacral ala and S2 segment. CT: Nondisplaced, vertical and transverse acute sacral insufficiency Fractures  -s/p perQ fixation on 5/20  --Management per ortho primary team: P  . Complication of anesthesia  Versed- hard time working - "dinging for days" - Colonoscopy  . Constipation 12/09/2012   Formatting of this note might be different from the original. IMPRESSION:  increased constipation: trial of amitiza, to push fiber, can use mirlax prn.  eat 4 prunes a day. call prn. has had colonoscopy in past. is due next year for f/u  . COPD (chronic obstructive pulmonary disease) ([place]) 02/10/2013  . COPD (chronic obstructive pulmonary disease) with chronic bronchitis ([place]) 02/10/2013   Overnight pulse oximetry shows desaturations greater than 5 minutes at a time less than 88%.  Order signed for nocturnal O2 at 2 L/m February 21, 2016  Overview:  Overnight pulse oximetry shows desaturations greater than 5 minutes at a time less than 88%.  Order signed for nocturnal O2 at 2 L/m February 21, 2016 Overview:  Overnight pulse oximetry shows desaturations greater than 5 minutes at a time l  . COPD (chronic obstructive pulmonary disease) with emphysema ([place])   . Decreased mobility 01/30/2020  . 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 therapy 12/07/2014  . Diverticulosis of colon 10/12/2007   Qualifier: Diagnosis of  By: Nils Pyle CMA (AAMA), Mearl Latin    . Essential tremor 05/22/2013  . Gastroparesis   . GERD (gastroesophageal reflux disease)   . GERD with esophagitis 10/12/2007   Qualifier: Diagnosis of  By: Nils Pyle CMA (AAMA), Mearl Latin    Overview:  Overview:  Qualifier: Diagnosis of  By: Nils Pyle CMA Deborra Medina), Mearl Latin  Overview:  Overview:  Qualifier: Diagnosis of  By: Nils Pyle CMA (Deborra Medina), Mearl Latin   . History of tobacco abuse 09/18/2013  . Humerus distal fracture 01/24/2018  . Hx of colonic polyps   . Hypertension   . Hypertension, benign 07/19/2007   Qualifier: Diagnosis of  By: Garen Grams    Overview:  Overview:  STORY: The goal for blood pressure is less than 140/90.  If you are checking your blood pressure at home, please record it and bring it to your next office visit. Following the Dietary Approaches to Stop Hypertension (DASH) diet (3 servings of fruit and vegetables daily, whole grains, low sodium, low-fat proteins) and exercisin  . Impaired mobility and ADLs 01/30/2020  . Inability to walk  10/03/2018  . Insomnia 10/22/2012  . Intractable low back pain 10/03/2018  . Multinodular goiter 06/01/2017  . 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 Obstructive sleep apnea (adult) (pediatric) 09/18/2013   Overview:  Last Assessment & Plan:  She has mild sleep apnea.  I have reviewed the recent sleep study results with the patient.  We discussed how sleep apnea can affect various health problems including risks for hypertension, cardiovascular disease, and diabetes.  We also discussed how sleep disruption can increase risks for accident, such as while driving.  Weight loss as a means of improving sl  . OSA (obstructive sleep apnea) 09/18/2013  . Osteoarthrosis 12/09/2012  . Osteopenia of multiple sites 10/12/2007   Qualifier: Diagnosis of  By: Nils Pyle CMA ([place]), Mearl Latin    . Osteoporosis   . Palpitations 07/14/2018  . Personal history of colonic polyps 10/07/2009   tubular adenoma  . Tachycardia, paroxysmal ([place]) 07/04/2018  . Urinary tract infection   . Vitamin B12 deficiency   . Vitamin D deficiency 12/09/2012   Overview:  Overview:  IMPRESSION: Appropiate labs done today. We will send the results and adjust treatment as needed. Bone density discussed. There has  been an improvement in her bone density comparing the one in 2009 from the one 2011. From osteoporotic she went to  osteopenic. She did take Actonel 3 years ago for several years and stopped it due to GI side effects. We will continue mantaining f    Tobacco History: Social History   Tobacco Use  Smoking Status Former Smoker  . Packs/day: 1.00  . Years: 25.00  . Pack years: 25.00  . Types: Cigarettes  . Quit date: 05/18/2009  . Years since quitting: 11.0  Smokeless Tobacco Never Used   Counseling given: Not Answered   Outpatient Medications Prior to Visit  Medication Sig Dispense Refill  . albuterol (PROVENTIL) (2.5 MG/3ML) 0.083% nebulizer solution Inhale into the lungs every 6 (six) hours as needed for wheezing or shortness  of breath.     Marland Termini albuterol (VENTOLIN HFA) 108 (90 Base) MCG/ACT inhaler Inhale 2 puffs into the lungs every 6 (six) hours as needed for wheezing or shortness of breath.    . AMBULATORY NON FORMULARY MEDICATION Take 10 mg by mouth 3 (three) times daily before meals. Medication Name: Domperidone 270 tablet 3  . amLODipine (NORVASC) 5 MG tablet Take 5 mg by mouth daily.     Marland Mansour aspirin EC 81 MG tablet Take 81 mg by mouth every other day.    . budesonide-formoterol (SYMBICORT) 160-4.5 MCG/ACT inhaler Inhale 2 puffs into the lungs 2 (two) times daily. 1 Inhaler 5  . calcium carbonate (OS-CAL - DOSED IN MG OF ELEMENTAL CALCIUM) 1250 (500 Ca) MG tablet Take 1 tablet by mouth. 600mg  bid    . Cholecalciferol 125 MCG (5000 UT) TABS Take by mouth.    . denosumab (PROLIA) 60 MG/ML SOSY injection Inject into the skin.    Marland Rains gabapentin (NEURONTIN) 600 MG tablet Take 0.5 tablets by mouth 3 (three) times daily.    Marland Nolting LORazepam (ATIVAN) 0.5 MG tablet Take 0.5 mg by mouth as needed for anxiety.     . magic mouthwash SOLN Take 5 mLs by mouth 3 (three) times daily. 450 mL 1  . pantoprazole (PROTONIX) 40 MG tablet Take 40 mg by mouth 2 (two) times daily.     . Probiotic CAPS Take 1 capsule by mouth daily.    . sodium chloride irrigation 0.9 % irrigation daily as needed.    . sucralfate (CARAFATE) 1 GM/10ML suspension TAKE 2 TEASPOONFUL (10 MLS) BY MOUTH  4 TIMES DAILY WITH MEALS AND AT BEDTIME 960 mL 1  . tiotropium (SPIRIVA) 18 MCG inhalation capsule Place 1 capsule (18 mcg total) into inhaler and inhale daily. 90 capsule 3   No facility-administered medications prior to visit.    Review of Systems  Review of Systems  Constitutional: Negative.   Respiratory: Positive for shortness of breath and wheezing. Negative for cough and chest tightness.    Physical Exam  BP 138/80   Pulse 90   Temp 97.8 F (36.6 C)   Ht 5\' 6"  (1.676 m)   Wt 170 lb (77.1 kg)   SpO2 93%   BMI 27.44 kg/m  Physical  Exam Constitutional:      Appearance: She is well-developed.  HENT:     Head: Normocephalic and atraumatic.  Cardiovascular:     Rate and Rhythm: Normal rate and regular rhythm.     Heart sounds: Murmur heard.      Comments: Exertional tachycardia Pulmonary:     Effort: Pulmonary effort is normal.     Breath sounds: Examination of the right-lower field reveals rales. Rales present. No decreased breath sounds, wheezing or rhonchi.  Musculoskeletal:        General:  Normal range of motion.  Skin:    General: Skin is warm and dry.  Neurological:     General: No focal deficit present.     Mental Status: She is alert and oriented to person, place, and time.  Psychiatric:        Mood and Affect: Mood normal.        Behavior: Behavior normal.      Lab Results:  CBC    Component Value Date/Time   WBC 15.2 (H) 01/22/2018 1620   RBC 4.01 01/22/2018 1620   HGB 12.7 01/22/2018 1620   HCT 36.9 01/22/2018 1620   PLT 435 (H) 01/22/2018 1620   MCV 92.0 01/22/2018 1620   MCH 31.7 01/22/2018 1620   MCHC 34.4 01/22/2018 1620   RDW 13.0 01/22/2018 1620   LYMPHSABS 1.2 01/22/2018 1620   MONOABS 2.1 (H) 01/22/2018 1620   EOSABS 0.0 01/22/2018 1620   BASOSABS 0.0 01/22/2018 1620    BMET    Component Value Date/Time   NA 136 01/27/2018 0353   K 3.9 01/27/2018 0353   CL 98 01/27/2018 0353   CO2 34 (H) 01/27/2018 0353   GLUCOSE 91 01/27/2018 0353   BUN 7 (L) 01/27/2018 0353   CREATININE 0.58 01/27/2018 0353   CALCIUM 8.4 (L) 01/27/2018 0353   GFRNONAA >60 01/27/2018 0353   GFRAA >60 01/27/2018 0353    BNP No results found for: BNP  ProBNP    Component Value Date/Time   PROBNP 102.4 12/27/2012 0540    Imaging: No results found.   Assessment & Plan:   COPD (chronic obstructive pulmonary disease) - Patient reports some increased exertional dyspnea with occasional wheezing. Requires SABA once a week. She did not like Breztri. Lungs were mostly clear on exam, faint rales  right base. Continue Symbicort 160 two puffs twice daily + Spiriva handihaler (recommend adding spacer with HFA). Checking CXR today and ordering repeat PFTs   Tachycardia, paroxysmal [facility]) - Following with Cardiology, last seen in September 2021. Tachycardia felt to be related to deconditioning and COPD. Checking basic labs today, D-dimer and BNP.    Martyn Ehrich, NP 06/12/2020

## 2020-06-12 ENCOUNTER — Ambulatory Visit (INDEPENDENT_AMBULATORY_CARE_PROVIDER_SITE_OTHER): Payer: PPO

## 2020-06-12 ENCOUNTER — Other Ambulatory Visit: Payer: Self-pay

## 2020-06-12 ENCOUNTER — Encounter: Payer: Self-pay | Admitting: Primary Care

## 2020-06-12 ENCOUNTER — Ambulatory Visit: Payer: PPO | Admitting: Primary Care

## 2020-06-12 VITALS — BP 138/80 | HR 90 | Temp 97.8°F | Ht 66.0 in | Wt 170.0 lb

## 2020-06-12 DIAGNOSIS — J449 Chronic obstructive pulmonary disease, unspecified: Secondary | ICD-10-CM

## 2020-06-12 DIAGNOSIS — R0602 Shortness of breath: Secondary | ICD-10-CM

## 2020-06-12 DIAGNOSIS — I479 Paroxysmal tachycardia, unspecified: Secondary | ICD-10-CM | POA: Diagnosis not present

## 2020-06-12 DIAGNOSIS — J9811 Atelectasis: Secondary | ICD-10-CM | POA: Diagnosis not present

## 2020-06-12 LAB — BRAIN NATRIURETIC PEPTIDE: Pro B Natriuretic peptide (BNP): 37 pg/mL (ref 0.0–100.0)

## 2020-06-12 LAB — BASIC METABOLIC PANEL
BUN: 11 mg/dL (ref 6–23)
CO2: 28 mEq/L (ref 19–32)
Calcium: 9.9 mg/dL (ref 8.4–10.5)
Chloride: 94 mEq/L — ABNORMAL LOW (ref 96–112)
Creatinine, Ser: 0.55 mg/dL (ref 0.40–1.20)
GFR: 90.97 mL/min (ref >60.00)
Glucose, Bld: 77 mg/dL (ref 70–99)
Potassium: 4.1 mEq/L (ref 3.5–5.1)
Sodium: 129 mEq/L — ABNORMAL LOW (ref 135–145)

## 2020-06-12 LAB — CBC
HCT: 39.1 % (ref 36.0–46.0)
Hemoglobin: 13 g/dL (ref 12.0–15.0)
MCHC: 33.1 g/dL (ref 30.0–36.0)
MCV: 94.3 fl (ref 78.0–100.0)
Platelets: 379 10*3/uL (ref 150.0–400.0)
RBC: 4.15 Mil/uL (ref 3.87–5.11)
RDW: 13.7 % (ref 11.5–15.5)
WBC: 6.8 10*3/uL (ref 4.0–10.5)

## 2020-06-12 LAB — D-DIMER, QUANTITATIVE: D-Dimer, Quant: 0.25 mcg/mL FEU (ref <0.50)

## 2020-06-12 NOTE — Assessment & Plan Note (Addendum)
-   Following with Cardiology, last seen in September 2021. Tachycardia felt to be related to deconditioning and COPD. Checking basic labs today, D-dimer and BNP.

## 2020-06-12 NOTE — Progress Notes (Signed)
Please let patient know her labs were normal except sodium was low at 129. She needs to salt her food extra and restrict free water to 1-1.5L a day. She should follow-up with PCP this week if able.

## 2020-06-12 NOTE — Progress Notes (Signed)
Reviewed and agree with assessment/plan.   Chesley Mires, MD [facility] Pulmonary/Critical Care 06/12/2020, 7:47 PM Pager:  [phone]

## 2020-06-12 NOTE — Assessment & Plan Note (Addendum)
-   Patient reports some increased exertional dyspnea with occasional wheezing. Requires SABA once a week. She did not like Breztri. Lungs were mostly clear on exam, faint rales right base. Continue Symbicort 160 two puffs twice daily + Spiriva handihaler (recommend adding spacer with HFA). Checking CXR today and ordering repeat PFTs

## 2020-06-12 NOTE — Patient Instructions (Addendum)
Orders: - CXR today - Labs  - Pulmonary function test  Recommendations: - Continue Symbicort two puffs twice a day (Use with spacer and rinse mouth after use) - Continue Spiriva 2 puffs once a day  - I would increase oxygen to 3L with moderate exertion - I would follow-up with PCP (or cardiologist) about exertional tachycardia if testing comes back normal   Follow-up: - 2-4 weeks with PFTs with Dr. Halford Chessman or [facility]   COPD and Physical Activity Chronic obstructive pulmonary disease (COPD) is a long-term (chronic) condition that affects the lungs. COPD is a general term that can be used to describe many different lung problems that cause lung swelling (inflammation) and limit airflow, including chronic bronchitis and emphysema. The main symptom of COPD is shortness of breath, which makes it harder to do even simple tasks. This can also make it harder to exercise and be active. Talk with your health care provider about treatments to help you breathe better and actions you can take to prevent breathing problems during physical activity. What are the benefits of exercising with COPD? Exercising regularly is an important part of a healthy lifestyle. You can still exercise and do physical activities even though you have COPD. Exercise and physical activity improve your shortness of breath by increasing blood flow (circulation). This causes your heart to pump more oxygen through your body. Moderate exercise can improve your:  Oxygen use.  Energy level.  Shortness of breath.  Strength in your breathing muscles.  Heart health.  Sleep.  Self-esteem and feelings of self-worth.  Depression, stress, and anxiety levels. Exercise can benefit everyone with COPD. The severity of your disease may affect how hard you can exercise, especially at first, but everyone can benefit. Talk with your health care provider about how much exercise is safe for you, and which activities and exercises are safe for you.    What actions can I take to prevent breathing problems during physical activity?  Sign up for a pulmonary rehabilitation program. This type of program may include: ? Education about lung diseases. ? Exercise classes that teach you how to exercise and be more active while improving your breathing. This usually involves:  Exercise using your lower extremities, such as a stationary bicycle.  About 30 minutes of exercise, 2 to 5 times per week, for 6 to 12 weeks  Strength training, such as push ups or leg lifts. ? Nutrition education. ? Group classes in which you can talk with others who also have COPD and learn ways to manage stress.  If you use an oxygen tank, you should use it while you exercise. Work with your health care provider to adjust your oxygen for your physical activity. Your resting flow rate is different from your flow rate during physical activity.  While you are exercising: ? Take slow breaths. ? Pace yourself and do not try to go too fast. ? Purse your lips while breathing out. Pursing your lips is similar to a kissing or whistling position. ? If doing exercise that uses a quick burst of effort, such as weight lifting:  Breathe in before starting the exercise.  Breathe out during the hardest part of the exercise (such as raising the weights). Where to find support You can find support for exercising with COPD from:  Your health care provider.  A pulmonary rehabilitation program.  Your local health department or community health programs.  Support groups, online or in-person. Your health care provider may be able to recommend support  groups. Where to find more information You can find more information about exercising with COPD from:  American Lung Association: ClassInsider.se.  COPD Foundation: https://www.rivera.net/. Contact a health care provider if:  Your symptoms get worse.  You have chest pain.  You have nausea.  You have a fever.  You have trouble talking  or catching your breath.  You want to start a new exercise program or a new activity. Summary  COPD is a general term that can be used to describe many different lung problems that cause lung swelling (inflammation) and limit airflow. This includes chronic bronchitis and emphysema.  Exercise and physical activity improve your shortness of breath by increasing blood flow (circulation). This causes your heart to provide more oxygen to your body.  Contact your health care provider before starting any exercise program or new activity. Ask your health care provider what exercises and activities are safe for you. This information is not intended to replace advice given to you by your health care provider. Make sure you discuss any questions you have with your health care provider. Document Revised: 08/24/2018 Document Reviewed: 05/27/2017 Elsevier Patient Education  2021 [employer].

## 2020-06-13 NOTE — Progress Notes (Signed)
Please let patient know CXR showed slight increase in atelectasis in left lung. Recommend she use an Incentive spirometer if she has one 5-10 breaths every hour to help take deep breaths. IF she does not have an IS can we put an order in  NO evidence of pneumonia, pulmonary edema,suspicious pulmonary nodule/mass, pleural effusion, or pneumothorax.

## 2020-06-14 NOTE — Progress Notes (Signed)
Called and went over xray results per E. Volanda Napoleon NP with patient. All questions answered and patient expressed full understanding of result and of E. Volanda Napoleon recommendations to use Incentive Spirometer 5-10 breaths every hour. Patient stated she does have an incentive spirometer and will use it as recommended. Nothing further needed at this time.

## 2020-06-19 DIAGNOSIS — Z1231 Encounter for screening mammogram for malignant neoplasm of breast: Secondary | ICD-10-CM | POA: Diagnosis not present

## 2020-06-24 ENCOUNTER — Other Ambulatory Visit (HOSPITAL_COMMUNITY)
Admission: RE | Admit: 2020-06-24 | Discharge: 2020-06-24 | Disposition: A | Discharge status: Home / Self Care | Payer: PPO | Source: Ambulatory Visit | Attending: Primary Care | Admitting: Primary Care

## 2020-06-24 DIAGNOSIS — Z20822 Contact with and (suspected) exposure to covid-19: Secondary | ICD-10-CM | POA: Diagnosis not present

## 2020-06-24 DIAGNOSIS — Z01812 Encounter for preprocedural laboratory examination: Secondary | ICD-10-CM | POA: Diagnosis not present

## 2020-06-24 LAB — SARS CORONAVIRUS 2 (TAT 6-24 HRS): SARS Coronavirus 2: NEGATIVE

## 2020-06-27 ENCOUNTER — Encounter: Payer: Self-pay | Admitting: Primary Care

## 2020-06-27 ENCOUNTER — Encounter (HOSPITAL_COMMUNITY): Payer: Self-pay | Admitting: *Deleted

## 2020-06-27 ENCOUNTER — Ambulatory Visit: Payer: PPO | Admitting: Primary Care

## 2020-06-27 ENCOUNTER — Ambulatory Visit (INDEPENDENT_AMBULATORY_CARE_PROVIDER_SITE_OTHER): Payer: PPO | Admitting: Pulmonary Disease

## 2020-06-27 ENCOUNTER — Other Ambulatory Visit: Payer: Self-pay

## 2020-06-27 VITALS — BP 124/76 | HR 103 | Temp 97.4°F | Ht 65.5 in | Wt 170.0 lb

## 2020-06-27 DIAGNOSIS — J449 Chronic obstructive pulmonary disease, unspecified: Secondary | ICD-10-CM | POA: Diagnosis not present

## 2020-06-27 DIAGNOSIS — R0602 Shortness of breath: Secondary | ICD-10-CM

## 2020-06-27 DIAGNOSIS — I479 Paroxysmal tachycardia, unspecified: Secondary | ICD-10-CM | POA: Diagnosis not present

## 2020-06-27 LAB — PULMONARY FUNCTION TEST
DL/VA % pred: 80 %
DL/VA: 3.28 ml/min/mmHg/L
DLCO cor % pred: 55 %
DLCO cor: 11.26 ml/min/mmHg
DLCO unc % pred: 54 %
DLCO unc: 11.12 ml/min/mmHg
FEF 25-75 Post: 0.4 L/sec
FEF 25-75 Pre: 0.44 L/sec
FEF2575-%Change-Post: -9 %
FEF2575-%Pred-Post: 21 %
FEF2575-%Pred-Pre: 23 %
FEV1-%Change-Post: -3 %
FEV1-%Pred-Post: 40 %
FEV1-%Pred-Pre: 41 %
FEV1-Post: 0.93 L
FEV1-Pre: 0.96 L
FEV1FVC-%Change-Post: 1 %
FEV1FVC-%Pred-Pre: 69 %
FEV6-%Change-Post: -4 %
FEV6-%Pred-Post: 59 %
FEV6-%Pred-Pre: 61 %
FEV6-Post: 1.74 L
FEV6-Pre: 1.81 L
FEV6FVC-%Change-Post: 0 %
FEV6FVC-%Pred-Post: 103 %
FEV6FVC-%Pred-Pre: 102 %
FVC-%Change-Post: -4 %
FVC-%Pred-Post: 57 %
FVC-%Pred-Pre: 60 %
FVC-Post: 1.76 L
FVC-Pre: 1.85 L
Post FEV1/FVC ratio: 53 %
Post FEV6/FVC ratio: 99 %
Pre FEV1/FVC ratio: 52 %
Pre FEV6/FVC Ratio: 98 %
RV % pred: 157 %
RV: 3.66 L
TLC % pred: 105 %
TLC: 5.58 L

## 2020-06-27 MED ORDER — TRELEGY ELLIPTA 200-62.5-25 MCG/INH IN AEPB: 1.0000 | INHALATION_SPRAY | Freq: Every day | RESPIRATORY_TRACT | 2 refills | Status: DC

## 2020-06-27 MED ORDER — TRELEGY ELLIPTA 200-62.5-25 MCG/INH IN AEPB: 1.0000 | INHALATION_SPRAY | Freq: Every day | RESPIRATORY_TRACT | 0 refills | Status: DC

## 2020-06-27 NOTE — Assessment & Plan Note (Addendum)
-   Patient continue to experience moderate-severe dyspnea on exertion. Not acutely exacerbated today. Her lung function has significantly declined over the last several years. She has severe obstructive airway disease on PFTs/ FEV1 0.93L (40% predicted). - Recommend trial Trelegy 200 one puff daily. Referring to pulmonary rehab. Checking CT chest wo contrast d/t rales on exam and moderate-severe diffusion defect - FU in 4-6 weeks

## 2020-06-27 NOTE — Assessment & Plan Note (Addendum)
-   Seen by Cardiology in September 2021, tachycardia was felt to be related to deconditioning and COPD. HR increasing into 130-150s on exertion. Patient would like to be referred to Dr. Brien Mates

## 2020-06-27 NOTE — Patient Instructions (Addendum)
Recommendations: Trial Trelegy 200- take one puff in the morning (rinse mouth after use) Hold Symbicort/ Spiriva   Refer: Cardiology Virginia Burns  re: tachycardia  Pulmonary rehab re: severe COPD   Orders: CT chest wo contrast (ordered)  Follow-up: First available with Virginia Burns in 4-8 weeks

## 2020-06-27 NOTE — Progress Notes (Signed)
PFT done today. 

## 2020-06-27 NOTE — Progress Notes (Signed)
@Patient  ID: Virginia Burns, female    DOB: 1946-05-08, 76 y.o.   MRN: [id number]  Chief Complaint  Patient presents with  . Follow-up    Increased SOB and HR    Referring provider: Berkley Harvey, NP  HPI: 75 year old female, former smoker. PMH significant for emphysema, COPD, chronic respiratory failure, HTN, tachycardia, OSA, GERD, multinodular goiter, colon polyps, diverticulosis. Patient of Dr. Halford Chessman, last seen on 12/01/19. Recommended patient be started on Breztri. She is on nocturnal oxygen.  Previous [facility] pulmonary encounter: 06/12/2020 Patient presents today for 6 month follow-up. She reports increase dyspnea and tachycardia on exertion over the last 5 months. She gets winded with minimal activity. Occasional wheezing. She does not have a cough. She wears 2L oxygen at night. Lately she has been requiring oxygen during the day on exertion. She has a POC which she has had for over a year. States that Symbicort and Spiriva work well for her. She did not like Breztri. She uses her rescue inhaler once a week on average. She completed physical therapy, they want her to return in February. She saw cardiology in September 2021 for tachycardia. No evidence of obstructive PSA. He felt tachycardia was related to deconditioning and COPD.   06/27/2020 Patient presents today for 2 week follow-up with PFTs.  Patient was seen in January for 46-month follow-up and reported increased dyspnea and tachycardia on exertion over the last several months.  She is dyspneic with minimal activity. CXR showed slight increase in lingular atelectasis, no other significant change.  BNP and D-dimer were normal. Patient tells me that she did not feel well yesterday. She went to lunch with her friends and when she came home she had diarrhea. Reports that her HR went up to 150 and O2 was 88%. It took her awhile to recover, she needed to take Ativan to calm down. Her main issues is her heart rate increasing into 130-150s with  exertion. She is not requiring wearing oxygen during the day. She has been using incentive spirometer which helps her oxygen levels. Denies acute symptoms of cough, chest tightness or wheezing.   Pulmonary testing:  PFT 08/09/07>>FEV1 1.54 (63%), FEV1% 53, TLC 3.42 (65%), DLCO 64%, no BD  PFT 02/10/13>>FEV1 1.32 (52%), FEV1% 58, TLC 5.07 (96%), DLCO 60%, no BD  PFT 06/27/2020 >> FEV1 0.93 (40%), FEV1% 53, TLC 5.58 (105%), DLCO 55%, no BD  Sleep testing:  PSG 10/24/13>>AHI 6.8, SaO2 low 84%, Spent 28.3 min with SaO2 <90%.   Allergies  Allergen Reactions  . Clindamycin/Lincomycin Hives  . Bactrim [Sulfamethoxazole-Trimethoprim] Swelling    swelling  . Elemental Sulfur Rash    Immunization History  Administered Date(s) Administered  . Fluad Quad(high Dose 65+) 02/01/2019  . Influenza Split 02/27/2009, 02/18/2010, 03/24/2011, 02/05/2012, 02/15/2013, 02/28/2013, 02/23/2014, 03/18/2015, 02/20/2016  . Influenza Whole 02/16/2012  . Influenza, High Dose Seasonal PF 02/28/2013, 02/20/2016, 03/05/2017, 03/18/2018, 02/01/2019, 01/30/2020  . Influenza,inj,Quad PF,6+ Mos 03/18/2015  . Influenza,inj,quad, With Preservative 02/23/2014, 03/18/2015  . Influenza-Unspecified 02/20/2016  . PFIZER(Purple Top)SARS-COV-2 Vaccination 06/30/2019, 07/21/2019, 03/27/2020  . Pneumococcal Conjugate-13 01/10/2008, 03/07/2013  . Pneumococcal Polysaccharide-23 03/02/2016  . Pneumococcal-Unspecified 01/10/2008  . Tdap 07/23/2009, 01/23/2019  . Zoster 01/09/2010    Past Medical History:  Diagnosis Date  . Adjustment disorder with anxiety 08/17/2013  . Allergic rhinitis   . Anxiety state 08/17/2013   Overview:  IMPRESSION: hx of anxiety, lost a friend and has friend in ICU, using the ativan prn. had refilled 07/27/13  .  Aortic ejection murmur 07/14/2018  . Benign neoplasm of ascending colon 12/07/2014  . Benign neoplasm of descending colon 12/07/2014  . Centrilobular emphysema ([place]) 12/07/2014  . Chronic  hyponatremia 10/05/2018   Last Assessment & Plan:  Formatting of this note is different from the original. Recent Labs    10/05/18 0007 10/06/18 0504 10/07/18 0733  NA 132* 131* 135   Baseline ~ 132   -stable  . Chronic respiratory failure ([place]) 03/04/2015  . Closed fracture of sacrum and coccyx ([place]) 10/03/2018   Last Assessment & Plan:  Formatting of this note might be different from the original. Patient presented with progressively worsening lower back pain and inability to ambulate  MR: Acute fracture of the right greater than left sacral ala and S2 segment. CT: Nondisplaced, vertical and transverse acute sacral insufficiency Fractures  -s/p perQ fixation on 5/20  --Management per ortho primary team: P  . Complication of anesthesia    Versed- hard time working - "dinging for days" - Colonoscopy  . Constipation 12/09/2012   Formatting of this note might be different from the original. IMPRESSION: increased constipation: trial of amitiza, to push fiber, can use mirlax prn.  eat 4 prunes a day. call prn. has had colonoscopy in past. is due next year for f/u  . COPD (chronic obstructive pulmonary disease) ([place]) 02/10/2013  . COPD (chronic obstructive pulmonary disease) with chronic bronchitis ([place]) 02/10/2013   Overnight pulse oximetry shows desaturations greater than 5 minutes at a time less than 88%.  Order signed for nocturnal O2 at 2 L/m February 21, 2016  Overview:  Overnight pulse oximetry shows desaturations greater than 5 minutes at a time less than 88%.  Order signed for nocturnal O2 at 2 L/m February 21, 2016 Overview:  Overnight pulse oximetry shows desaturations greater than 5 minutes at a time l  . COPD (chronic obstructive pulmonary disease) with emphysema ([place])   . Decreased mobility 01/30/2020  . 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 therapy 12/07/2014  . Diverticulosis of colon 10/12/2007   Qualifier: Diagnosis of  By: Nils Pyle CMA (AAMA), Mearl Latin    . Essential tremor 05/22/2013  . Gastroparesis   .  GERD (gastroesophageal reflux disease)   . GERD with esophagitis 10/12/2007   Qualifier: Diagnosis of  By: Nils Pyle CMA (AAMA), Mearl Latin    Overview:  Overview:  Qualifier: Diagnosis of  By: Nils Pyle CMA Deborra Medina), Mearl Latin  Overview:  Overview:  Qualifier: Diagnosis of  By: Nils Pyle CMA (Deborra Medina), Mearl Latin   . History of tobacco abuse 09/18/2013  . Humerus distal fracture 01/24/2018  . Hx of colonic polyps   . Hypertension   . Hypertension, benign 07/19/2007   Qualifier: Diagnosis of  By: Garen Grams    Overview:  Overview:  STORY: The goal for blood pressure is less than 140/90.  If you are checking your blood pressure at home, please record it and bring it to your next office visit. Following the Dietary Approaches to Stop Hypertension (DASH) diet (3 servings of fruit and vegetables daily, whole grains, low sodium, low-fat proteins) and exercisin  . Impaired mobility and ADLs 01/30/2020  . Inability to walk 10/03/2018  . Insomnia 10/22/2012  . Intractable low back pain 10/03/2018  . Multinodular goiter 06/01/2017  . 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 Obstructive sleep apnea (adult) (pediatric) 09/18/2013   Overview:  Last Assessment & Plan:  She has mild sleep apnea.  I have reviewed the recent sleep study results with the patient.  We discussed how sleep  apnea can affect various health problems including risks for hypertension, cardiovascular disease, and diabetes.  We also discussed how sleep disruption can increase risks for accident, such as while driving.  Weight loss as a means of improving sl  . OSA (obstructive sleep apnea) 09/18/2013  . Osteoarthrosis 12/09/2012  . Osteopenia of multiple sites 10/12/2007   Qualifier: Diagnosis of  By: Nils Pyle CMA ([place]), Mearl Latin    . Osteoporosis   . Palpitations 07/14/2018  . Personal history of colonic polyps 10/07/2009   tubular adenoma  . Tachycardia, paroxysmal ([place]) 07/04/2018  . Urinary tract infection   . Vitamin B12 deficiency   . Vitamin D deficiency  12/09/2012   Overview:  Overview:  IMPRESSION: Appropiate labs done today. We will send the results and adjust treatment as needed. Bone density discussed. There has  been an improvement in her bone density comparing the one in 2009 from the one 2011. From osteoporotic she went to osteopenic. She did take Actonel 3 years ago for several years and stopped it due to GI side effects. We will continue mantaining f    Tobacco History: Social History   Tobacco Use  Smoking Status Former Smoker  . Packs/day: 1.00  . Years: 25.00  . Pack years: 25.00  . Types: Cigarettes  . Quit date: 05/18/2009  . Years since quitting: 11.1  Smokeless Tobacco Never Used   Counseling given: Not Answered   Outpatient Medications Prior to Visit  Medication Sig Dispense Refill  . albuterol (PROVENTIL) (2.5 MG/3ML) 0.083% nebulizer solution Inhale into the lungs every 6 (six) hours as needed for wheezing or shortness of breath.     Marland Haughton albuterol (VENTOLIN HFA) 108 (90 Base) MCG/ACT inhaler Inhale 2 puffs into the lungs every 6 (six) hours as needed for wheezing or shortness of breath.    . AMBULATORY NON FORMULARY MEDICATION Take 10 mg by mouth 3 (three) times daily before meals. Medication Name: Domperidone 270 tablet 3  . amLODipine (NORVASC) 5 MG tablet Take 5 mg by mouth daily.     Marland Trachtenberg aspirin EC 81 MG tablet Take 81 mg by mouth every other day.    . budesonide-formoterol (SYMBICORT) 160-4.5 MCG/ACT inhaler Inhale 2 puffs into the lungs 2 (two) times daily. 1 Inhaler 5  . calcium carbonate (OS-CAL - DOSED IN MG OF ELEMENTAL CALCIUM) 1250 (500 Ca) MG tablet Take 1 tablet by mouth. 600mg  bid    . Cholecalciferol 125 MCG (5000 UT) TABS Take by mouth.    . denosumab (PROLIA) 60 MG/ML SOSY injection Inject into the skin.    Marland Vanderheiden gabapentin (NEURONTIN) 600 MG tablet Take 0.5 tablets by mouth 3 (three) times daily.    Marland Tigges LORazepam (ATIVAN) 0.5 MG tablet Take 0.5 mg by mouth as needed for anxiety.     . magic mouthwash SOLN  Take 5 mLs by mouth 3 (three) times daily. 450 mL 1  . pantoprazole (PROTONIX) 40 MG tablet Take 40 mg by mouth 2 (two) times daily.     . Probiotic CAPS Take 1 capsule by mouth daily.    . sodium chloride irrigation 0.9 % irrigation daily as needed.    . sucralfate (CARAFATE) 1 GM/10ML suspension TAKE 2 TEASPOONFUL (10 MLS) BY MOUTH  4 TIMES DAILY WITH MEALS AND AT BEDTIME 960 mL 1  . tiotropium (SPIRIVA) 18 MCG inhalation capsule Place 1 capsule (18 mcg total) into inhaler and inhale daily. 90 capsule 3   No facility-administered medications prior to visit.   Review of  Systems  Review of Systems  Constitutional: Positive for fatigue.  Respiratory: Positive for shortness of breath.    Physical Exam  BP 124/76 (BP Location: Left Arm, Cuff Size: Normal)   Pulse (!) 103   Temp (!) 97.4 F (36.3 C) (Temporal)   Ht 5' 5.5" (1.664 m)   Wt 170 lb (77.1 kg)   SpO2 98%   BMI 27.86 kg/m  Physical Exam Constitutional:      Appearance: Normal appearance.  Cardiovascular:     Rate and Rhythm: Tachycardia present.  Pulmonary:     Breath sounds: Rales present.  Musculoskeletal:     Comments: Amb with rolling walker  Neurological:     Mental Status: She is alert.      Lab Results:  CBC    Component Value Date/Time   WBC 6.8 06/12/2020 1237   RBC 4.15 06/12/2020 1237   HGB 13.0 06/12/2020 1237   HCT 39.1 06/12/2020 1237   PLT 379.0 06/12/2020 1237   MCV 94.3 06/12/2020 1237   MCH 31.7 01/22/2018 1620   MCHC 33.1 06/12/2020 1237   RDW 13.7 06/12/2020 1237   LYMPHSABS 1.2 01/22/2018 1620   MONOABS 2.1 (H) 01/22/2018 1620   EOSABS 0.0 01/22/2018 1620   BASOSABS 0.0 01/22/2018 1620    BMET    Component Value Date/Time   NA 129 (L) 06/12/2020 1237   K 4.1 06/12/2020 1237   CL 94 (L) 06/12/2020 1237   CO2 28 06/12/2020 1237   GLUCOSE 77 06/12/2020 1237   BUN 11 06/12/2020 1237   CREATININE 0.55 06/12/2020 1237   CALCIUM 9.9 06/12/2020 1237   GFRNONAA >60 01/27/2018  0353   GFRAA >60 01/27/2018 0353    BNP No results found for: BNP  ProBNP    Component Value Date/Time   PROBNP 37.0 06/12/2020 1237    Imaging: DG Chest 2 View  Result Date: 06/12/2020 CLINICAL DATA:  Shortness of breath.  COPD. EXAM: CHEST - 2 VIEW COMPARISON:  01/23/2019 FINDINGS: The cardiomediastinal silhouette is unremarkable. Chronic peribronchial thickening again noted. Slightly increased lingular atelectasis identified. There is no evidence of focal airspace disease, pulmonary edema, suspicious pulmonary nodule/mass, pleural effusion, or pneumothorax. No acute bony abnormalities are identified. IMPRESSION: Slightly increased lingular atelectasis. No other significant change. Electronically Signed   By: Margarette Canada M.D.   On: 06/12/2020 20:11     Assessment & Plan:   COPD (chronic obstructive pulmonary disease) - Patient continue to experience moderate-severe dyspnea on exertion. Not acutely exacerbated today. Her lung function has significantly declined over the last several years. She has severe obstructive airway disease on PFTs/ FEV1 0.93L (40% predicted). - Recommend trial Trelegy 200 one puff daily. Referring to pulmonary rehab. Checking CT chest wo contrast d/t rales on exam and moderate-severe diffusion defect - FU in 4-6 weeks    Tachycardia, paroxysmal ([place]) - Seen by Cardiology in September 2021, tachycardia was felt to be related to deconditioning and COPD. HR increasing into 130-150s on exertion. Patient would like to be referred to Dr. Garth Bigness, NP 06/27/2020

## 2020-06-27 NOTE — Progress Notes (Signed)
Received referral from Dr. Halford Chessman for this pt to participate in pulmonary rehab with the the diagnosis of Shortness of Breath. Clinical review of pt follow up appt on 2/10 with Geraldo Pitter NP Pulmonary office note. Pt seen prior by Dr. Bettina Gavia for tachycardia. Pt with Covid Risk Score - 4 . Pt appropriate for scheduling for Pulmonary rehab.  Will forward to support staff for scheduling and verification of insurance eligibility/benefits with pt consent. Cherre Huger, BSN Cardiac and Training and development officer

## 2020-07-02 ENCOUNTER — Other Ambulatory Visit: Payer: Self-pay

## 2020-07-02 ENCOUNTER — Ambulatory Visit (INDEPENDENT_AMBULATORY_CARE_PROVIDER_SITE_OTHER)
Admission: RE | Admit: 2020-07-02 | Discharge: 2020-07-02 | Disposition: A | Discharge status: Home / Self Care | Payer: PPO | Source: Ambulatory Visit | Attending: Primary Care | Admitting: Primary Care

## 2020-07-02 DIAGNOSIS — R0602 Shortness of breath: Secondary | ICD-10-CM | POA: Diagnosis not present

## 2020-07-02 DIAGNOSIS — R0600 Dyspnea, unspecified: Secondary | ICD-10-CM | POA: Diagnosis not present

## 2020-07-02 DIAGNOSIS — R911 Solitary pulmonary nodule: Secondary | ICD-10-CM | POA: Diagnosis not present

## 2020-07-02 DIAGNOSIS — I700 Atherosclerosis of aorta: Secondary | ICD-10-CM | POA: Diagnosis not present

## 2020-07-02 DIAGNOSIS — M47814 Spondylosis without myelopathy or radiculopathy, thoracic region: Secondary | ICD-10-CM | POA: Diagnosis not present

## 2020-07-03 NOTE — Progress Notes (Signed)
Reviewed and agree with assessment/plan.   Chesley Mires, MD [facility] Pulmonary/Critical Care 07/03/2020, 11:00 AM Pager:  [phone]

## 2020-07-04 NOTE — Progress Notes (Signed)
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 Stable pulmonary nodule > 2 years and considered benign. No emphysema or ILD (fibrosis).  Incidental findings 79mm thyroid nodule, they are recommending an ultrasound- she should follow up with PCP regarding this. Moderate thoracic aortic atherosclerosis.

## 2020-07-05 ENCOUNTER — Telehealth: Payer: Self-pay | Admitting: Primary Care

## 2020-07-05 NOTE — Telephone Encounter (Signed)
Nothing to be concerned about pulmonary wise. There was a 56mm subpleural nodule that is stable and considered benign. No fluid.

## 2020-07-05 NOTE — Telephone Encounter (Signed)
Martyn Ehrich, NP  Cyril Railey, Waldemar Dickens, CMA Please let patient know she has some chronic post infectious/inflammatory scarring to lower lungs that is similar to previous. Stable pulmonary nodule > 2 years and considered benign. No emphysema or ILD (fibrosis).   Incidental findings 20mm thyroid nodule, they are recommending an ultrasound- she should follow up with PCP regarding this. Moderate thoracic aortic atherosclerosis.      Called and spoke with pt letting her know the results of CT and she verbalized understanding. Pt stated she has made appt with PCP in regards to thyroid nodule. Nothing further needed.

## 2020-07-10 NOTE — Progress Notes (Signed)
Cardiology Office Note:   Date:  07/11/2020  NAME:  Virginia Burns    MRN: [id number] DOB:  1945-09-25   PCP:  Berkley Harvey, NP  Cardiologist:  No primary care provider on file.   Referring MD: Martyn Ehrich, NP   Chief Complaint  Patient presents with  . New Patient (Initial Visit)  . Tachycardia   History of Present Illness:   Virginia Burns is a 75 y.o. female with a hx of COPD, HTN, GERD who presents for the evaluation of SOB/tachycardia at the request of Martyn Ehrich, NP. CT chest 07/03/2020 with mild coronary calcium and aortic atherosclerosis. Saw [place] last year and tachycardia and attributed to COPD. She reports for the last year since having her COVID-19 pneumonia shot in March 2021 she had episodes of heart racing with walking.  She reports her heart rate can increase up into the 150 bpm range.  She reports this occurs with exertion.  She does wear 2 L of oxygen reports her oxygen level does not decrease.  She was seen by Dr. Shirlee More in [facility] and an echocardiogram was performed that was normal.  She also underwent an ABI.  Symptoms were attributed to COPD.  No further testing was done.  No monitor.  Her EKG in office demonstrates sinus rhythm with anterior inferior Q waves.  Echo was normal in the past. Her EKG is largely unchanged from her visit with Dr. Bettina Gavia.  She reports she had a recent CT of her chest.  She will have thyroid nodules that will be followed up by her primary care physician.  I recommended a TSH, free T4 and T3.  She is not anemic.  No recent change in medications.  She is on trilogy.  She is a former smoker.  She smoked for 25 years.  She no longer smokes.  She is the mother of Virginia Burns who is a patient of mine.  CVD risk factor include hypertension and hyperlipidemia.  She has exceedingly high good HDL cholesterol.  She is never had a heart attack or stroke.  Recent CT chest shows aortic atherosclerosis and minimal coronary  calcifications.  She reports no exertional chest pain or pressure.  She does get short of breath with exertion but it is more the heart rates in the bothers her.  No history of atrial fibrillation.  Problem List 1. COPD -2L O2 -moderate to severe 2. HTN 3. GERD 4. T chol 208, HDL 103, LDL 93, TG 60 5.  Former Tobacco abuse -20 pack years   Past Medical History: Past Medical History:  Diagnosis Date  . Adjustment disorder with anxiety 08/17/2013  . Allergic rhinitis   . Anxiety state 08/17/2013   Overview:  IMPRESSION: hx of anxiety, lost a friend and has friend in ICU, using the ativan prn. had refilled 07/27/13  . Aortic ejection murmur 07/14/2018  . Benign neoplasm of ascending colon 12/07/2014  . Benign neoplasm of descending colon 12/07/2014  . Centrilobular emphysema ([place]) 12/07/2014  . Chronic hyponatremia 10/05/2018   Last Assessment & Plan:  Formatting of this note is different from the original. Recent Labs    10/05/18 0007 10/06/18 0504 10/07/18 0733  NA 132* 131* 135   Baseline ~ 132   -stable  . Chronic respiratory failure ([place]) 03/04/2015  . Closed fracture of sacrum and coccyx ([place]) 10/03/2018   Last Assessment & Plan:  Formatting of this note might be different from the original. Patient presented  with progressively worsening lower back pain and inability to ambulate  MR: Acute fracture of the right greater than left sacral ala and S2 segment. CT: Nondisplaced, vertical and transverse acute sacral insufficiency Fractures  -s/p perQ fixation on 5/20  --Management per ortho primary team: P  . Complication of anesthesia    Versed- hard time working - "dinging for days" - Colonoscopy  . Constipation 12/09/2012   Formatting of this note might be different from the original. IMPRESSION: increased constipation: trial of amitiza, to push fiber, can use mirlax prn.  eat 4 prunes a day. call prn. has had colonoscopy in past. is due next year for f/u  . COPD (chronic obstructive pulmonary  disease) ([place]) 02/10/2013  . COPD (chronic obstructive pulmonary disease) with chronic bronchitis ([place]) 02/10/2013   Overnight pulse oximetry shows desaturations greater than 5 minutes at a time less than 88%.  Order signed for nocturnal O2 at 2 L/m February 21, 2016  Overview:  Overnight pulse oximetry shows desaturations greater than 5 minutes at a time less than 88%.  Order signed for nocturnal O2 at 2 L/m February 21, 2016 Overview:  Overnight pulse oximetry shows desaturations greater than 5 minutes at a time l  . COPD (chronic obstructive pulmonary disease) with emphysema ([place])   . Decreased mobility 01/30/2020  . 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 therapy 12/07/2014  . Diverticulosis of colon 10/12/2007   Qualifier: Diagnosis of  By: Nils Pyle CMA (AAMA), Mearl Latin    . Essential tremor 05/22/2013  . Gastroparesis   . GERD (gastroesophageal reflux disease)   . GERD with esophagitis 10/12/2007   Qualifier: Diagnosis of  By: Nils Pyle CMA (AAMA), Mearl Latin    Overview:  Overview:  Qualifier: Diagnosis of  By: Nils Pyle CMA Deborra Medina), Mearl Latin  Overview:  Overview:  Qualifier: Diagnosis of  By: Nils Pyle CMA (Deborra Medina), Mearl Latin   . History of tobacco abuse 09/18/2013  . Humerus distal fracture 01/24/2018  . Hx of colonic polyps   . Hypertension   . Hypertension, benign 07/19/2007   Qualifier: Diagnosis of  By: Garen Grams    Overview:  Overview:  STORY: The goal for blood pressure is less than 140/90.  If you are checking your blood pressure at home, please record it and bring it to your next office visit. Following the Dietary Approaches to Stop Hypertension (DASH) diet (3 servings of fruit and vegetables daily, whole grains, low sodium, low-fat proteins) and exercisin  . Impaired mobility and ADLs 01/30/2020  . Inability to walk 10/03/2018  . Insomnia 10/22/2012  . Intractable low back pain 10/03/2018  . Multinodular goiter 06/01/2017  . 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 Obstructive sleep apnea (adult) (pediatric) 09/18/2013    Overview:  Last Assessment & Plan:  She has mild sleep apnea.  I have reviewed the recent sleep study results with the patient.  We discussed how sleep apnea can affect various health problems including risks for hypertension, cardiovascular disease, and diabetes.  We also discussed how sleep disruption can increase risks for accident, such as while driving.  Weight loss as a means of improving sl  . OSA (obstructive sleep apnea) 09/18/2013  . Osteoarthrosis 12/09/2012  . Osteopenia of multiple sites 10/12/2007   Qualifier: Diagnosis of  By: Nils Pyle CMA ([place]), Mearl Latin    . Osteoporosis   . Palpitations 07/14/2018  . Personal history of colonic polyps 10/07/2009   tubular adenoma  . Tachycardia, paroxysmal ([place]) 07/04/2018  . Urinary tract infection   . Vitamin B12 deficiency   .  Vitamin D deficiency 12/09/2012   Overview:  Overview:  IMPRESSION: Appropiate labs done today. We will send the results and adjust treatment as needed. Bone density discussed. There has  been an improvement in her bone density comparing the one in 2009 from the one 2011. From osteoporotic she went to osteopenic. She did take Actonel 3 years ago for several years and stopped it due to GI side effects. We will continue mantaining f    Past Surgical History: Past Surgical History:  Procedure Laterality Date  . ABDOMINAL HYSTERECTOMY    . ABDOMINAL HYSTERECTOMY    . CATARACT EXTRACTION Right   . CHOLECYSTECTOMY    . COLONOSCOPY WITH PROPOFOL N/A 11/21/2014   Procedure: COLONOSCOPY WITH PROPOFOL;  Surgeon: Irene Shipper, MD;  Location: [facility];  Service: Endoscopy;  Laterality: N/A;  . FRACTURE SURGERY    . [place]  . LIGAMENT REPAIR Left 04/18/2013   Procedure: Triangular Fibrocartilage complex open repair;  Surgeon: Jolyn Nap, MD;  Location: [facility];  Service: Orthopedics;  Laterality: Left;  . OOPHORECTOMY    . OPEN REDUCTION INTERNAL FIXATION (ORIF) DISTAL RADIAL FRACTURE  Left 04/18/2013   Procedure: LEFT OPEN REDUCTION INTERNAL FIXATION (ORIF) DISTAL RADIAL FRACTURE;  Surgeon: Jolyn Nap, MD;  Location: [facility];  Service: Orthopedics;  Laterality: Left;  . ORIF ELBOW FRACTURE Left 01/24/2018   Procedure: OPEN REDUCTION INTERNAL FIXATION (ORIF) DISTAL HUMERUS  ELBOW/OLECRANON OSTEOTOMY FRACTURE;  Surgeon: [name], MD;  Location: [place];  Service: Orthopedics;  Laterality: Left;    Current Medications: Current Meds  Medication Sig  . AMBULATORY NON FORMULARY MEDICATION Take 10 mg by mouth 3 (three) times daily before meals. Medication Name: Domperidone  . amLODipine (NORVASC) 5 MG tablet Take 5 mg by mouth daily.   Marland Bradeen aspirin EC 81 MG tablet Take 81 mg by mouth every other day.  . budesonide-formoterol (SYMBICORT) 160-4.5 MCG/ACT inhaler Inhale 2 puffs into the lungs 2 (two) times daily.  . calcium carbonate (OS-CAL - DOSED IN MG OF ELEMENTAL CALCIUM) 1250 (500 Ca) MG tablet Take 1 tablet by mouth. 600mg  bid  . Cholecalciferol 125 MCG (5000 UT) TABS Take by mouth.  . denosumab (PROLIA) 60 MG/ML SOSY injection Inject into the skin.  Marland Glidden Fluticasone-Umeclidin-Vilant (TRELEGY ELLIPTA) 200-62.5-25 MCG/INH AEPB Inhale 1 puff into the lungs daily.  Marland Newbold gabapentin (NEURONTIN) 600 MG tablet Take 0.5 tablets by mouth 3 (three) times daily.  Marland Freimuth LORazepam (ATIVAN) 0.5 MG tablet Take 0.5 mg by mouth as needed for anxiety.   . magic mouthwash SOLN Take 5 mLs by mouth 3 (three) times daily.  . pantoprazole (PROTONIX) 40 MG tablet Take 40 mg by mouth 2 (two) times daily.   . Probiotic CAPS Take 1 capsule by mouth daily.  . sucralfate (CARAFATE) 1 GM/10ML suspension TAKE 2 TEASPOONFUL (10 MLS) BY MOUTH  4 TIMES DAILY WITH MEALS AND AT BEDTIME  . tiotropium (SPIRIVA) 18 MCG inhalation capsule Place 1 capsule (18 mcg total) into inhaler and inhale daily.     Allergies:    Clindamycin/lincomycin, Bactrim [sulfamethoxazole-trimethoprim], and Elemental sulfur    Social History: Social History   Socioeconomic History  . Marital status: Single    Spouse name: Not on file  . Number of children: 1  . Years of education: Not on file  . Highest education level: Not on file  Occupational History  . Occupation: Architectural technologist: [employer]  Tobacco  Use  . Smoking status: Former Smoker    Packs/day: 1.00    Years: 25.00    Pack years: 25.00    Types: Cigarettes    Quit date: 05/18/2009    Years since quitting: 11.1  . Smokeless tobacco: Never Used  Vaping Use  . Vaping Use: Never used  Substance and Sexual Activity  . Alcohol use: Yes    Alcohol/week: 2.0 standard drinks    Types: 2 Glasses of wine per week    Comment: 2 glasses of wine each night  . Drug use: No  . Sexual activity: Not on file  Other Topics Concern  . 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    She lives with son.  She is divorced.         Social Determinants of Health   Financial Resource Strain: Not on file  Food Insecurity: Not on file  Transportation Needs: Not on file  Physical Activity: Not on file  Stress: Not on file  Social Connections: Not on file     Family History: The patient's family history includes COPD in her mother; Colitis in her son; Colon cancer in her cousin; Diabetes in her brother; Diabetes type II in her son; Emphysema in her mother; Other in her father; Tremor in her maternal aunt and mother.  ROS:   All other ROS reviewed and negative. Pertinent positives noted in the HPI.     EKGs/Labs/Other Studies Reviewed:   The following studies were personally reviewed by me today:  EKG:  EKG is ordered today.  The ekg ordered today demonstrates sinus tachycardia heart rate 106, nonspecific IVCD, likely inferior Q waves, old anterior infarct noted, and was personally reviewed by me.   TTE 07/18/2018 1. The left ventricle has normal systolic function with an ejection  fraction  of 60-65%. The cavity size was normal. Left ventricular diastolic  Doppler parameters are consistent with impaired relaxation.  2. The right ventricle has normal systolic function. The cavity was  normal. There is no increase in right ventricular wall thickness.  3. The mitral valve is normal in structure.  4. The tricuspid valve is normal in structure.  5. The aortic valve is normal in structure.  6. The pulmonic valve was normal in structure.  7. There is dilatation of the ascending aorta measuring 36 mm.   ABI 01/05/2020 Summary:  Right: Resting right ankle-brachial index is within normal range. No  evidence of significant right lower extremity arterial disease. The right  toe-brachial index is abnormal.   Left: Resting left ankle-brachial index is within normal range. No  evidence of significant left lower extremity arterial disease. The left  toe-brachial index is normal.   Recent Labs: 06/12/2020: BUN 11; Creatinine, Ser 0.55; Hemoglobin 13.0; Platelets 379.0; Potassium 4.1; Pro B Natriuretic peptide (BNP) 37.0; Sodium 129   Recent Lipid Panel No results found for: CHOL, TRIG, HDL, CHOLHDL, VLDL, LDLCALC, LDLDIRECT  Physical Exam:   VS:  BP 124/78 (BP Location: Left Arm, Patient Position: Sitting, Cuff Size: Normal)   Pulse (!) 106   Ht 5' 5.5" (1.664 m)   Wt 175 lb (79.4 kg)   BMI 28.68 kg/m    Wt Readings from Last 3 Encounters:  07/11/20 175 lb (79.4 kg)  06/27/20 170 lb (77.1 kg)  06/12/20 170 lb (77.1 kg)    General: Well nourished, well developed, in no acute distress Head: Atraumatic, normal size  Eyes: PEERLA, EOMI  Neck: Supple, no JVD Endocrine: No thryomegaly Cardiac: Normal S1, S2; RRR; no murmurs, rubs, or gallops Lungs: Clear to auscultation bilaterally, no wheezing, rhonchi or rales  Abd: Soft, nontender, no hepatomegaly  Ext: No edema, pulses 2+ Musculoskeletal: No deformities, BUE and BLE strength normal and equal Skin: Warm and dry, no  rashes   Neuro: Alert and oriented to person, place, time, and situation, CNII-XII grossly intact, no focal deficits  Psych: Normal mood and affect   ASSESSMENT:   NAYELI CALVERT is a 75 y.o. female who presents for the following: 1. SOB (shortness of breath) on exertion   2. Tachycardia   3. Coronary artery calcification seen on CT scan     PLAN:   1. SOB (shortness of breath) on exertion 2. Tachycardia 3. Coronary artery calcification seen on CT scan -History of moderate to severe COPD.  On 2 L of oxygen.  Gets short of breath and tachycardic with exertion.  EKG demonstrates sinus tachycardia.  She has poor R wave progression which is unchanged from EKG a year ago.  Echocardiogram last year also was normal.  No evidence of heart failure.  She reports no symptoms of exertional chest pain or pressure.  Nothing is concerning for angina. -She needs a TSH which will be done by her primary care physician.  She is not anemic.  She apparently had a thyroid nodule that will need to be followed up.  I will defer the testing of the thyroid to them.  They will follow this.  We seem to make sure her thyroid is normal. -I see no need for repeat echocardiogram. -She is extremely high risk for atrial fibrillation or other arrhythmias given her lung disease.  I would like to proceed with a 7-day Zio patch to exclude arrhythmias. -No symptoms of angina.  Exceedingly high HDL cholesterol.  We will hold on stress test for now.  We will plan to follow-up the results of her monitor by phone.  If it is abnormal we will see her back.   Disposition: Return if symptoms worsen or fail to improve.  Medication Adjustments/Labs and Tests Ordered: Current medicines are reviewed at length with the patient today.  Concerns regarding medicines are outlined above.  Orders Placed This Encounter  Procedures  . LONG TERM MONITOR (3-14 DAYS)  . EKG 12-Lead   No orders of the defined types were placed in this  encounter.   Patient Instructions  Medication Instructions:  The current medical regimen is effective;  continue present plan and medications.  *If you need a refill on your cardiac medications before your next appointment, please call your pharmacy*  Testing/Procedures: ZIO XT- Long Term Monitor Instructions   Your physician has requested you wear your ZIO patch monitor___7____days.   This is a single patch monitor.  [employer] supplies one patch monitor per enrollment.  Additional stickers are not available.   Please do not apply patch if you will be having a Nuclear Stress Test, Echocardiogram, Cardiac CT, MRI, or Chest Xray during the time frame you would be wearing the monitor. The patch cannot be worn during these tests.  You cannot remove and re-apply the ZIO XT patch monitor.   Your ZIO patch monitor will be sent USPS Priority mail from [facility] directly to your home address. The monitor may also be mailed to a PO BOX if home delivery is not available.   It may take 3-5 days to receive your monitor after you  have been enrolled.   Once you have received you monitor, please review enclosed instructions.  Your monitor has already been registered assigning a specific monitor serial # to you.   Applying the monitor   Shave hair from upper left chest.   Hold abrader disc by orange tab.  Rub abrader in 40 strokes over left upper chest as indicated in your monitor instructions.   Clean area with 4 enclosed alcohol pads .  Use all pads to assure are is cleaned thoroughly.  Let dry.   Apply patch as indicated in monitor instructions.  Patch will be place under collarbone on left side of chest with arrow pointing upward.   Rub patch adhesive wings for 2 minutes.Remove white label marked "1".  Remove white label marked "2".  Rub patch adhesive wings for 2 additional minutes.   While looking in a mirror, press and release button in center of patch.  A small green light will  flash 3-4 times .  This will be your only indicator the monitor has been turned on.     Do not shower for the first 24 hours.  You may shower after the first 24 hours.   Press button if you feel a symptom. You will hear a small click.  Record Date, Time and Symptom in the Patient Log Book.   When you are ready to remove patch, follow instructions on last 2 pages of Patient Log Book.  Stick patch monitor onto last page of Patient Log Book.   Place Patient Log Book in [place] box.  Use locking tab on box and tape box closed securely.  The [employer] and [employer] has [employer] on it.  Please place in mailbox as soon as possible.  Your physician should have your test results approximately 7 days after the monitor has been mailed back to [facility].   Call [place] at [phone] if you have questions regarding your ZIO XT patch monitor.  Call them immediately if you see an orange light blinking on your monitor.   If your monitor falls off in less than 4 days contact our Monitor department at [phone].  If your monitor becomes loose or falls off after 4 days call [employer] at [phone] for suggestions on securing your monitor.     Follow-Up: At [facility], you and your health needs are our priority.  As part of our continuing mission to provide you with exceptional heart care, we have created designated Provider Care Teams.  These Care Teams include your primary Cardiologist (physician) and Advanced Practice Providers (APPs -  Physician Assistants and Nurse Practitioners) who all work together to provide you with the care you need, when you need it.  We recommend signing up for the patient portal called "MyChart".  Sign up information is provided on this After Visit Summary.  MyChart is used to connect with patients for Virtual Visits (Telemedicine).  Patients are able to view lab/test results, encounter notes, upcoming appointments, etc.  Non-urgent messages can be  sent to your provider as well.   To learn more about what you can do with MyChart, go to [email or web address].    Your next appointment:   As needed  The format for your next appointment:   In Person  Provider:   Eleonore Chiquito, MD        Signed, Addison Naegeli. Audie Box, [place]  [address] [place], [place] [address] [phone]  07/11/2020 11:12 AM

## 2020-07-11 ENCOUNTER — Ambulatory Visit: Payer: PPO | Admitting: Cardiovascular Disease

## 2020-07-11 ENCOUNTER — Other Ambulatory Visit: Payer: Self-pay

## 2020-07-11 ENCOUNTER — Encounter: Payer: Self-pay | Admitting: Cardiovascular Disease

## 2020-07-11 ENCOUNTER — Ambulatory Visit (INDEPENDENT_AMBULATORY_CARE_PROVIDER_SITE_OTHER): Payer: PPO

## 2020-07-11 ENCOUNTER — Encounter: Payer: Self-pay | Admitting: Radiology

## 2020-07-11 VITALS — BP 124/78 | HR 106 | Ht 65.5 in | Wt 175.0 lb

## 2020-07-11 DIAGNOSIS — I2510 Atherosclerotic heart disease of native coronary artery without angina pectoris: Secondary | ICD-10-CM | POA: Diagnosis not present

## 2020-07-11 DIAGNOSIS — R000 Tachycardia, unspecified: Secondary | ICD-10-CM | POA: Diagnosis not present

## 2020-07-11 DIAGNOSIS — R0602 Shortness of breath: Secondary | ICD-10-CM

## 2020-07-11 NOTE — Patient Instructions (Signed)
Medication Instructions:  The current medical regimen is effective;  continue present plan and medications.  *If you need a refill on your cardiac medications before your next appointment, please call your pharmacy*  Testing/Procedures: ZIO XT- Long Term Monitor Instructions   Your physician has requested you wear your ZIO patch monitor___7____days.   This is a single patch monitor.  [employer] supplies one patch monitor per enrollment.  Additional stickers are not available.   Please do not apply patch if you will be having a Nuclear Stress Test, Echocardiogram, Cardiac CT, MRI, or Chest Xray during the time frame you would be wearing the monitor. The patch cannot be worn during these tests.  You cannot remove and re-apply the ZIO XT patch monitor.   Your ZIO patch monitor will be sent USPS Priority mail from [facility] directly to your home address. The monitor may also be mailed to a PO BOX if home delivery is not available.   It may take 3-5 days to receive your monitor after you have been enrolled.   Once you have received you monitor, please review enclosed instructions.  Your monitor has already been registered assigning a specific monitor serial # to you.   Applying the monitor   Shave hair from upper left chest.   Hold abrader disc by orange tab.  Rub abrader in 40 strokes over left upper chest as indicated in your monitor instructions.   Clean area with 4 enclosed alcohol pads .  Use all pads to assure are is cleaned thoroughly.  Let dry.   Apply patch as indicated in monitor instructions.  Patch will be place under collarbone on left side of chest with arrow pointing upward.   Rub patch adhesive wings for 2 minutes.Remove white label marked "1".  Remove white label marked "2".  Rub patch adhesive wings for 2 additional minutes.   While looking in a mirror, press and release button in center of patch.  A small green light will flash 3-4 times .  This will be your only  indicator the monitor has been turned on.     Do not shower for the first 24 hours.  You may shower after the first 24 hours.   Press button if you feel a symptom. You will hear a small click.  Record Date, Time and Symptom in the Patient Log Book.   When you are ready to remove patch, follow instructions on last 2 pages of Patient Log Book.  Stick patch monitor onto last page of Patient Log Book.   Place Patient Log Book in [place] box.  Use locking tab on box and tape box closed securely.  The [employer] has [employer] on it.  Please place in mailbox as soon as possible.  Your physician should have your test results approximately 7 days after the monitor has been mailed back to [facility].   Call [place] at [phone] if you have questions regarding your ZIO XT patch monitor.  Call them immediately if you see an orange light blinking on your monitor.   If your monitor falls off in less than 4 days contact our Monitor department at [phone].  If your monitor becomes loose or falls off after 4 days call [employer] at [phone] for suggestions on securing your monitor.     Follow-Up: At [facility], you and your health needs are our priority.  As part of our continuing mission to provide you with exceptional heart care, we have  created designated Provider Care Teams.  These Care Teams include your primary Cardiologist (physician) and Advanced Practice Providers (APPs -  Physician Assistants and Nurse Practitioners) who all work together to provide you with the care you need, when you need it.  We recommend signing up for the patient portal called "MyChart".  Sign up information is provided on this After Visit Summary.  MyChart is used to connect with patients for Virtual Visits (Telemedicine).  Patients are able to view lab/test results, encounter notes, upcoming appointments, etc.  Non-urgent messages can be sent to your provider as well.   To  learn more about what you can do with MyChart, go to [email or web address].    Your next appointment:   As needed  The format for your next appointment:   In Person  Provider:   Eleonore Chiquito, MD

## 2020-07-11 NOTE — Progress Notes (Signed)
Enrolled patient for a 7 day Zio XT monitor to be mailed to patients home.  

## 2020-07-12 DIAGNOSIS — E871 Hypo-osmolality and hyponatremia: Secondary | ICD-10-CM | POA: Diagnosis not present

## 2020-07-12 DIAGNOSIS — E042 Nontoxic multinodular goiter: Secondary | ICD-10-CM | POA: Diagnosis not present

## 2020-07-15 DIAGNOSIS — R000 Tachycardia, unspecified: Secondary | ICD-10-CM | POA: Diagnosis not present

## 2020-07-19 ENCOUNTER — Telehealth (HOSPITAL_COMMUNITY): Payer: Self-pay

## 2020-07-19 DIAGNOSIS — E041 Nontoxic single thyroid nodule: Secondary | ICD-10-CM | POA: Diagnosis not present

## 2020-07-19 DIAGNOSIS — E042 Nontoxic multinodular goiter: Secondary | ICD-10-CM | POA: Diagnosis not present

## 2020-07-19 NOTE — Telephone Encounter (Signed)
Called patient to see if she was interested in participating in the Pulmonary Rehab Program. Patient stated yes. Patient will come in for orientation on 09/02/20 @ 1030AM and will attend the 1:15PM exercise class. Went over insurance, patient verbalized understanding.   Tourist information centre manager.

## 2020-07-19 NOTE — Telephone Encounter (Signed)
Pt insurance is active and benefits verified through [employer]. Co-pay $5.00, DED $0.00/$0.00 met, out of pocket $3,450.00/$93.14 met, co-insurance 0%. No pre-authorization required. Yaimee/HTA, 07/19/20 @ 449QP, RFF#[id number]

## 2020-07-29 DIAGNOSIS — I10 Essential (primary) hypertension: Secondary | ICD-10-CM | POA: Diagnosis not present

## 2020-07-29 DIAGNOSIS — Z8781 Personal history of (healed) traumatic fracture: Secondary | ICD-10-CM | POA: Diagnosis not present

## 2020-07-29 DIAGNOSIS — K219 Gastro-esophageal reflux disease without esophagitis: Secondary | ICD-10-CM | POA: Diagnosis not present

## 2020-07-29 DIAGNOSIS — M810 Age-related osteoporosis without current pathological fracture: Secondary | ICD-10-CM | POA: Diagnosis not present

## 2020-07-29 DIAGNOSIS — E042 Nontoxic multinodular goiter: Secondary | ICD-10-CM | POA: Diagnosis not present

## 2020-07-29 DIAGNOSIS — Z6828 Body mass index (BMI) 28.0-28.9, adult: Secondary | ICD-10-CM | POA: Diagnosis not present

## 2020-07-30 DIAGNOSIS — R000 Tachycardia, unspecified: Secondary | ICD-10-CM | POA: Diagnosis not present

## 2020-07-30 NOTE — Telephone Encounter (Signed)
Message sent to patient  Ms Driskill   Dr Jenetta Downer' Nori Riis has not resulted on the monitor . It usually takes a week to 10 days for the information to be sent to Dr Jenetta DownerNori Riis for him to review. Once he see the information he will place a result note to your MyChart  along with the summary of  report.  Ivin Booty RN

## 2020-08-08 ENCOUNTER — Ambulatory Visit: Payer: PPO | Admitting: Pulmonary Disease

## 2020-08-08 ENCOUNTER — Other Ambulatory Visit: Payer: Self-pay

## 2020-08-08 ENCOUNTER — Encounter: Payer: Self-pay | Admitting: Pulmonary Disease

## 2020-08-08 VITALS — BP 150/70 | HR 101 | Temp 97.4°F | Ht 65.0 in | Wt 170.2 lb

## 2020-08-08 DIAGNOSIS — J9611 Chronic respiratory failure with hypoxia: Secondary | ICD-10-CM

## 2020-08-08 DIAGNOSIS — J449 Chronic obstructive pulmonary disease, unspecified: Secondary | ICD-10-CM

## 2020-08-08 NOTE — Progress Notes (Signed)
Virginia Burns, Virginia Burns, Virginia Burns  Chief Complaint  Patient presents with  . Follow-up    Patient states she has shortness of breath with exertion. Wears 2 liters but will bump it up on POC with activity. Wants to talk about inhalers. Denies cough.     Constitutional:  BP (!) 150/70 (BP Location: Right Arm, Patient Position: Sitting, Cuff Size: Normal)   Pulse (!) 101   Temp (!) 97.4 F (36.3 C) (Temporal)   Ht 5\' 5"  (1.651 m)   Wt 170 lb 3.2 oz (77.2 kg)   SpO2 93% Comment: 2 liters pulse  BMI 28.32 kg/m   Past Medical History:  Anxiety, Diverticulosis, Tremor, Gastroparesis, GERD, Colon polyps, HTN, Insomnia, Goiter, OA, Osteopenia, B12 deficiency, Vit D deficiency  Past Surgical History:  She  has a past surgical history that includes Cholecystectomy; Abdominal hysterectomy; Open reduction internal fixation (orif) distal radial fracture (Left, 04/18/2013); Ligament repair (Left, 04/18/2013); Oophorectomy; Colonoscopy with propofol (N/A, 11/21/2014); Gallbladder surgery (1986); Cataract extraction (Right); Abdominal hysterectomy; Fracture surgery; Virginia ORIF elbow fracture (Left, 01/24/2018).  Brief Summary:  Virginia Burns is a 75 y.o. female former smoker with COPD Virginia chronic hypoxemic respiratory failure.      Subjective:   Breztri wasn't effective.  Trelegy helps, but doesn't seem to last the whole day.  She would like to switch back to symbicort [place] spiriva.    She isn't having cough, wheeze, or sputum.  She has appointment with cardiology to assess tachycardia.  If that checks out okay, then she will be starting Burns rehab in couple of weeks.  Physical Exam:   Appearance - well kempt, wearing oxygen, using a walker  ENMT - no sinus tenderness, no oral exudate, no LAN, Mallampati 3 airway, no stridor  Respiratory - decreased breath sounds bilaterally, no wheezing or rales  CV - s1s2 regular rate Virginia rhythm, no murmurs  Ext - no clubbing,  no edema  Skin - no rashes  Psych - normal mood Virginia affect   Burns testing:   PFT 08/09/07>>FEV1 1.54 (63%), FEV1% 53, TLC 3.42 (65%), DLCO 64%, no BD  PFT 02/10/13>>FEV1 1.32 (52%), FEV1% 58, TLC 5.07 (96%), DLCO 60%, no BD  PFT 06/27/20 >> FEV1 0.96 (41%), FEV1% 52, TLC 5.58 (105%), RV 3.66 (157%), DLCO 54%  Chest Imaging:   CT chest 07/03/20 >> atherosclerosis, bandlike opacities in lingular/Lt Virginia Rt lower lobes, no change in nodules (benign), dextroscoliosis  Sleep Tests:   PSG 10/24/13>>AHI 6.8, SaO2 low 84%, Spent 28.3 min with SaO2 <90%.  Cardiac Tests:   Echo 07/18/18 >> EF 60 to 65%  Social History:  She  reports that she quit smoking about 11 years ago. Her smoking use included cigarettes. She has a 25.00 pack-year smoking history. She has never used smokeless tobacco. She reports current alcohol use of about 2.0 standard drinks of alcohol per week. She reports that she does not use drugs.  Family History:  Her family history includes COPD in her mother; Colitis in her son; Colon cancer in her cousin; Diabetes in her brother; Diabetes type II in her son; Emphysema in her mother; Other in her father; Tremor in her maternal aunt Virginia mother.     Assessment/Plan:   COPD with emphysema. - breztri not effective, Virginia she felt that trelegy didn't last 24 hours - will transition back to symbicort [place] spiriva - prn albuterol - she is to start Burns rehab unless she has more cardiac issues  Chronic  respiratory failure with hypoxia. - 2 liters oxygen with exertion Virginia sleep   Tachycardia. - she is followed by Dr. Danelle Earthly with [facility]  Time Spent Involved in Patient Burns on Day of Examination:  31 minutes  Follow up:  Patient Instructions  Resume symbicort [place] spiriva once your script for trelegy is finished  Follow up in 6 months   Medication List:   Allergies as of 08/08/2020      Reactions   Clindamycin/lincomycin Hives   Bactrim  [sulfamethoxazole-trimethoprim] Swelling   swelling   Elemental Sulfur Rash      Medication List       Accurate as of August 08, 2020 12:23 PM. If you have any questions, ask your nurse or doctor.        STOP taking these medications   Trelegy Ellipta 200-62.5-25 MCG/INH Aepb Generic drug: Fluticasone-Umeclidin-Vilant Stopped by: Chesley Mires, MD     TAKE these medications   AMBULATORY NON FORMULARY MEDICATION Take 10 mg by mouth 3 (three) times daily before meals. Medication Name: Domperidone   amLODipine 5 MG tablet Commonly known as: NORVASC Take 5 mg by mouth daily.   aspirin EC 81 MG tablet Take 81 mg by mouth every other day.   budesonide-formoterol 160-4.5 MCG/ACT inhaler Commonly known as: Symbicort Inhale 2 puffs into the lungs 2 (two) times daily.   calcium carbonate 1250 (500 Ca) MG tablet Commonly known as: OS-CAL - dosed in mg of elemental calcium Take 1 tablet by mouth. 600mg  bid   Cholecalciferol 125 MCG (5000 UT) Tabs Take by mouth.   denosumab 60 MG/ML Sosy injection Commonly known as: PROLIA Inject into the skin.   gabapentin 600 MG tablet Commonly known as: NEURONTIN Take 0.5 tablets by mouth 3 (three) times daily.   LORazepam 0.5 MG tablet Commonly known as: ATIVAN Take 0.5 mg by mouth as needed for anxiety.   magic mouthwash Soln Take 5 mLs by mouth 3 (three) times daily.   pantoprazole 40 MG tablet Commonly known as: PROTONIX Take 40 mg by mouth 2 (two) times daily.   Probiotic Caps Take 1 capsule by mouth daily.   sucralfate 1 GM/10ML suspension Commonly known as: Carafate TAKE 2 TEASPOONFUL (10 MLS) BY MOUTH  4 TIMES DAILY WITH MEALS Virginia AT BEDTIME   tiotropium 18 MCG inhalation capsule Commonly known as: SPIRIVA Place 1 capsule (18 mcg total) into inhaler Virginia inhale daily.       Signature:  Chesley Mires, MD [place] Pager - [phone] 08/08/2020, 12:23 PM

## 2020-08-08 NOTE — Patient Instructions (Signed)
Resume symbicort and spiriva once your script for trelegy is finished  Follow up in 6 months

## 2020-08-12 NOTE — Telephone Encounter (Signed)
Dr. Halford Chessman,   Patient sent you a message this morning.  Hi Dr Halford Chessman, After my visit with you on Wednesday I wondered what you think my oxygen should be set on? Also do you think my racing heart has anything to do with copd? Thank you Virginia Burns

## 2020-08-14 ENCOUNTER — Other Ambulatory Visit: Payer: Self-pay

## 2020-08-14 ENCOUNTER — Encounter: Payer: Self-pay | Admitting: Cardiovascular Disease

## 2020-08-14 ENCOUNTER — Ambulatory Visit: Payer: PPO | Admitting: Cardiovascular Disease

## 2020-08-14 VITALS — BP 132/82 | HR 98 | Ht 65.0 in | Wt 170.2 lb

## 2020-08-14 DIAGNOSIS — I2510 Atherosclerotic heart disease of native coronary artery without angina pectoris: Secondary | ICD-10-CM | POA: Diagnosis not present

## 2020-08-14 DIAGNOSIS — R000 Tachycardia, unspecified: Secondary | ICD-10-CM

## 2020-08-14 DIAGNOSIS — I471 Supraventricular tachycardia: Secondary | ICD-10-CM | POA: Diagnosis not present

## 2020-08-14 DIAGNOSIS — R0602 Shortness of breath: Secondary | ICD-10-CM

## 2020-08-14 MED ORDER — DILTIAZEM HCL ER COATED BEADS 120 MG PO CP24: 120.0000 mg | ORAL_CAPSULE | Freq: Every day | ORAL | 3 refills | Status: DC

## 2020-08-14 NOTE — Progress Notes (Signed)
Cardiology Office Note:   Date:  08/14/2020  NAME:  Virginia Burns    MRN: [id number] DOB:  05-25-1945   PCP:  Berkley Harvey, NP  Cardiologist:  No primary care provider on file.  Electrophysiologist:  None   Referring MD: Berkley Harvey, NP   Chief Complaint  Patient presents with  . Palpitations   History of Present Illness:   Virginia Burns is a 75 y.o. female with a hx of moderate to severe copd, GERD, HLD who presents for follow-up. Recent monitor showed brief SVT.  She reports she is still having heart racing episodes.  When she exerts herself heart rate goes up into the 130 bpm range.  Heart rate on arrival was 110 bpm walking and 98 bpm sitting.  She reports his symptoms are bothersome.  She does get short of breath due to her severe COPD.  Her pulmonary specialist would like for her to do pulmonary rehab which I do agree with.  She reports that she would like to try medication to make her feel better.  I think this is reasonable.  We discussed starting diltiazem.  She is okay to do this.  We will need to stop her amlodipine.  She denies any chest tightness or chest pain.  Shortness of breath appears to be the same.  Rapid heartbeat sensation is bothersome.  Problem List 1. COPD -2L O2 -moderate to severe 2. HTN 3. GERD 4. T chol 208, HDL 103, LDL 93, TG 60 5.  Former Tobacco abuse -20 pack years  6. Ectopic atrial tachycardia -Brief ectopic atrial tachycardia episodes detected (longest 9.8 seconds).  Past Medical History: Past Medical History:  Diagnosis Date  . Adjustment disorder with anxiety 08/17/2013  . Allergic rhinitis   . Anxiety state 08/17/2013   Overview:  IMPRESSION: hx of anxiety, lost a friend and has friend in ICU, using the ativan prn. had refilled 07/27/13  . Aortic ejection murmur 07/14/2018  . Benign neoplasm of ascending colon 12/07/2014  . Benign neoplasm of descending colon 12/07/2014  . Centrilobular emphysema ([place]) 12/07/2014  . Chronic  hyponatremia 10/05/2018   Last Assessment & Plan:  Formatting of this note is different from the original. Recent Labs    10/05/18 0007 10/06/18 0504 10/07/18 0733  NA 132* 131* 135   Baseline ~ 132   -stable  . Chronic respiratory failure ([place]) 03/04/2015  . Closed fracture of sacrum and coccyx ([place]) 10/03/2018   Last Assessment & Plan:  Formatting of this note might be different from the original. Patient presented with progressively worsening lower back pain and inability to ambulate  MR: Acute fracture of the right greater than left sacral ala and S2 segment. CT: Nondisplaced, vertical and transverse acute sacral insufficiency Fractures  -s/p perQ fixation on 5/20  --Management per ortho primary team: P  . Complication of anesthesia    Versed- hard time working - "dinging for days" - Colonoscopy  . Constipation 12/09/2012   Formatting of this note might be different from the original. IMPRESSION: increased constipation: trial of amitiza, to push fiber, can use mirlax prn.  eat 4 prunes a day. call prn. has had colonoscopy in past. is due next year for f/u  . COPD (chronic obstructive pulmonary disease) ([place]) 02/10/2013  . COPD (chronic obstructive pulmonary disease) with chronic bronchitis ([place]) 02/10/2013   Overnight pulse oximetry shows desaturations greater than 5 minutes at a time less than 88%.  Order signed for nocturnal O2 at 2  L/m February 21, 2016  Overview:  Overnight pulse oximetry shows desaturations greater than 5 minutes at a time less than 88%.  Order signed for nocturnal O2 at 2 L/m February 21, 2016 Overview:  Overnight pulse oximetry shows desaturations greater than 5 minutes at a time l  . COPD (chronic obstructive pulmonary disease) with emphysema ([place])   . Decreased mobility 01/30/2020  . 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 therapy 12/07/2014  . Diverticulosis of colon 10/12/2007   Qualifier: Diagnosis of  By: Nils Pyle CMA (AAMA), Mearl Latin    . Essential tremor 05/22/2013  . Gastroparesis   .  GERD (gastroesophageal reflux disease)   . GERD with esophagitis 10/12/2007   Qualifier: Diagnosis of  By: Nils Pyle CMA (AAMA), Mearl Latin    Overview:  Overview:  Qualifier: Diagnosis of  By: Nils Pyle CMA Deborra Medina), Mearl Latin  Overview:  Overview:  Qualifier: Diagnosis of  By: Nils Pyle CMA (Deborra Medina), Mearl Latin   . History of tobacco abuse 09/18/2013  . Humerus distal fracture 01/24/2018  . Hx of colonic polyps   . Hypertension   . Hypertension, benign 07/19/2007   Qualifier: Diagnosis of  By: Garen Grams    Overview:  Overview:  STORY: The goal for blood pressure is less than 140/90.  If you are checking your blood pressure at home, please record it and bring it to your next office visit. Following the Dietary Approaches to Stop Hypertension (DASH) diet (3 servings of fruit and vegetables daily, whole grains, low sodium, low-fat proteins) and exercisin  . Impaired mobility and ADLs 01/30/2020  . Inability to walk 10/03/2018  . Insomnia 10/22/2012  . Intractable low back pain 10/03/2018  . Multinodular goiter 06/01/2017  . 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 Obstructive sleep apnea (adult) (pediatric) 09/18/2013   Overview:  Last Assessment & Plan:  She has mild sleep apnea.  I have reviewed the recent sleep study results with the patient.  We discussed how sleep apnea can affect various health problems including risks for hypertension, cardiovascular disease, and diabetes.  We also discussed how sleep disruption can increase risks for accident, such as while driving.  Weight loss as a means of improving sl  . OSA (obstructive sleep apnea) 09/18/2013  . Osteoarthrosis 12/09/2012  . Osteopenia of multiple sites 10/12/2007   Qualifier: Diagnosis of  By: Nils Pyle CMA ([place]), Mearl Latin    . Osteoporosis   . Palpitations 07/14/2018  . Personal history of colonic polyps 10/07/2009   tubular adenoma  . Tachycardia, paroxysmal ([place]) 07/04/2018  . Urinary tract infection   . Vitamin B12 deficiency   . Vitamin D deficiency  12/09/2012   Overview:  Overview:  IMPRESSION: Appropiate labs done today. We will send the results and adjust treatment as needed. Bone density discussed. There has  been an improvement in her bone density comparing the one in 2009 from the one 2011. From osteoporotic she went to osteopenic. She did take Actonel 3 years ago for several years and stopped it due to GI side effects. We will continue mantaining f    Past Surgical History: Past Surgical History:  Procedure Laterality Date  . ABDOMINAL HYSTERECTOMY    . ABDOMINAL HYSTERECTOMY    . CATARACT EXTRACTION Right   . CHOLECYSTECTOMY    . COLONOSCOPY WITH PROPOFOL N/A 11/21/2014   Procedure: COLONOSCOPY WITH PROPOFOL;  Surgeon: Irene Shipper, MD;  Location: [facility];  Service: Endoscopy;  Laterality: N/A;  . FRACTURE SURGERY    . [place]  . LIGAMENT  REPAIR Left 04/18/2013   Procedure: Triangular Fibrocartilage complex open repair;  Surgeon: Jolyn Nap, MD;  Location: [facility];  Service: Orthopedics;  Laterality: Left;  . OOPHORECTOMY    . OPEN REDUCTION INTERNAL FIXATION (ORIF) DISTAL RADIAL FRACTURE Left 04/18/2013   Procedure: LEFT OPEN REDUCTION INTERNAL FIXATION (ORIF) DISTAL RADIAL FRACTURE;  Surgeon: Jolyn Nap, MD;  Location: [facility];  Service: Orthopedics;  Laterality: Left;  . ORIF ELBOW FRACTURE Left 01/24/2018   Procedure: OPEN REDUCTION INTERNAL FIXATION (ORIF) DISTAL HUMERUS  ELBOW/OLECRANON OSTEOTOMY FRACTURE;  Surgeon: [name], MD;  Location: [facility];  Service: Orthopedics;  Laterality: Left;    Current Medications: Current Meds  Medication Sig  . AMBULATORY NON FORMULARY MEDICATION Take 10 mg by mouth 3 (three) times daily before meals. Medication Name: Domperidone  . Ascorbic Acid (VITAMIN C) 1000 MG tablet Take 1,000 mg by mouth daily.  Marland Steinert aspirin EC 81 MG tablet Take 81 mg by mouth every other day.  . budesonide-formoterol (SYMBICORT) 160-4.5 MCG/ACT  inhaler Inhale 2 puffs into the lungs 2 (two) times daily.  . calcium carbonate (OS-CAL - DOSED IN MG OF ELEMENTAL CALCIUM) 1250 (500 Ca) MG tablet Take 1 tablet by mouth. 600mg  bid  . Cholecalciferol 125 MCG (5000 UT) TABS Take by mouth.  . Cranberry 1000 MG CAPS Take by mouth.  . denosumab (PROLIA) 60 MG/ML SOSY injection Inject into the skin.  Marland Jaimes diltiazem (CARDIZEM CD) 120 MG 24 hr capsule Take 1 capsule (120 mg total) by mouth daily.  . Flaxseed, Linseed, (FLAX SEED OIL) 1000 MG CAPS Take by mouth.  . gabapentin (NEURONTIN) 600 MG tablet Take 0.5 tablets by mouth 3 (three) times daily.  Marland Santana LORazepam (ATIVAN) 0.5 MG tablet Take 0.5 mg by mouth as needed for anxiety.   . pantoprazole (PROTONIX) 40 MG tablet Take 40 mg by mouth 2 (two) times daily.   . Probiotic CAPS Take 1 capsule by mouth daily.  . sucralfate (CARAFATE) 1 GM/10ML suspension TAKE 2 TEASPOONFUL (10 MLS) BY MOUTH  4 TIMES DAILY WITH MEALS AND AT BEDTIME (Patient taking differently: daily as needed. TAKE 2 TEASPOONFUL (10 MLS) BY MOUTH  4 TIMES DAILY WITH MEALS AND AT BEDTIME)  . tiotropium (SPIRIVA) 18 MCG inhalation capsule Place 1 capsule (18 mcg total) into inhaler and inhale daily.  . vitamin B-12 (CYANOCOBALAMIN) 1000 MCG tablet Take 2,000 mcg by mouth daily.  . [DISCONTINUED] amLODipine (NORVASC) 5 MG tablet Take 5 mg by mouth daily.      Allergies:    Clindamycin/lincomycin, Bactrim [sulfamethoxazole-trimethoprim], and Elemental sulfur   Social History: Social History   Socioeconomic History  . Marital status: Single    Spouse name: Not on file  . Number of children: 1  . Years of education: Not on file  . Highest education level: Not on file  Occupational History  . Occupation: Architectural technologist: [employer]  Tobacco Use  . Smoking status: Former Smoker    Packs/day: 1.00    Years: 25.00    Pack years: 25.00    Types: Cigarettes    Quit date: 05/18/2009    Years since quitting: 11.2  . Smokeless  tobacco: Never Used  Vaping Use  . Vaping Use: Never used  Substance and Sexual Activity  . Alcohol use: Yes    Alcohol/week: 2.0 standard drinks    Types: 2 Glasses of wine per week    Comment: 2 glasses of wine each  night  . Drug use: No  . Sexual activity: Not on file  Other Topics Concern  . 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    She lives with son.  She is divorced.         Social Determinants of Health   Financial Resource Strain: Not on file  Food Insecurity: Not on file  Transportation Needs: Not on file  Physical Activity: Not on file  Stress: Not on file  Social Connections: Not on file     Family History: The patient's family history includes COPD in her mother; Colitis in her son; Colon cancer in her cousin; Diabetes in her brother; Diabetes type II in her son; Emphysema in her mother; Other in her father; Tremor in her maternal aunt and mother.  ROS:   All other ROS reviewed and negative. Pertinent positives noted in the HPI.     EKGs/Labs/Other Studies Reviewed:   The following studies were personally reviewed by me today:  [facility] 07/11/2020  Impression: 1. Brief ectopic atrial tachycardia episodes detected (7 episodes in 7 days; longest 9.8 seconds). 2. Symptoms occurred with normal sinus rhythm.  3. Rare ectopy.   TTE 07/18/2018 1. The left ventricle has normal systolic function with an ejection  fraction of 60-65%. The cavity size was normal. Left ventricular diastolic  Doppler parameters are consistent with impaired relaxation.  2. The right ventricle has normal systolic function. The cavity was  normal. There is no increase in right ventricular wall thickness.  3. The mitral valve is normal in structure.  4. The tricuspid valve is normal in structure.  5. The aortic valve is normal in structure.  6. The pulmonic valve was normal in structure.  7. There is dilatation of the ascending  aorta measuring 36 mm.   Recent Labs: 06/12/2020: BUN 11; Creatinine, Ser 0.55; Hemoglobin 13.0; Platelets 379.0; Potassium 4.1; Pro B Natriuretic peptide (BNP) 37.0; Sodium 129   Recent Lipid Panel No results found for: CHOL, TRIG, HDL, CHOLHDL, VLDL, LDLCALC, LDLDIRECT  Physical Exam:   VS:  BP 132/82   Pulse 98   Ht 5\' 5"  (1.651 m)   Wt 170 lb 3.2 oz (77.2 kg)   BMI 28.32 kg/m    Wt Readings from Last 3 Encounters:  08/14/20 170 lb 3.2 oz (77.2 kg)  08/08/20 170 lb 3.2 oz (77.2 kg)  07/11/20 175 lb (79.4 kg)    General: Well nourished, well developed, in no acute distress Head: Atraumatic, normal size  Eyes: PEERLA, EOMI  Neck: Supple, no JVD Endocrine: No thryomegaly Cardiac: Normal S1, S2; RRR; no murmurs, rubs, or gallops Lungs: Diminished breath sounds bilaterally Abd: Soft, nontender, no hepatomegaly  Ext: No edema, pulses 2+ Musculoskeletal: No deformities, BUE and BLE strength normal and equal Skin: Warm and dry, no rashes   Neuro: Alert and oriented to person, place, time, and situation, CNII-XII grossly intact, no focal deficits  Psych: Normal mood and affect   ASSESSMENT:   Virginia Burns is a 75 y.o. female who presents for the following: 1. Ectopic atrial tachycardia ([place])   2. Tachycardia   3. SOB (shortness of breath) on exertion   4. Coronary artery calcification seen on CT scan     PLAN:   1. Ectopic atrial tachycardia ([place]) 2. Tachycardia -Recent monitor demonstrates brief ectopic atrial tachycardia.  Symptoms occur with sinus tachycardia.  She had no evidence of atrial fibrillation.  Still  having symptoms.  I did discuss with her that her symptoms are likely coming from deconditioning and her lung disease.  I am okay to try diltiazem 120 mg extended release to lessen her symptoms.  She will need to stop her Norvasc.  She is okay to try this.  Her cardiovascular examination is unremarkable.  No murmurs.  She had an echocardiogram in 2020 that showed  normal LV function.  She has no evidence of congestive heart failure.  We will start with this and I will see her back in 3 months.  3. SOB (shortness of breath) on exertion -Secondary to COPD.  I agree with pulmonary rehab.  4. Coronary artery calcification seen on CT scan -Continue aspirin.  Most recent HDL cholesterol over 100.  LDL 93.  I think this is okay for now.  We will continue to monitor.   Disposition: Return in about 3 months (around 11/14/2020).  Medication Adjustments/Labs and Tests Ordered: Current medicines are reviewed at length with the patient today.  Concerns regarding medicines are outlined above.  No orders of the defined types were placed in this encounter.  Meds ordered this encounter  Medications  . diltiazem (CARDIZEM CD) 120 MG 24 hr capsule    Sig: Take 1 capsule (120 mg total) by mouth daily.    Dispense:  90 capsule    Refill:  3    Patient Instructions  Medication Instructions:  Stop Amlodipine Start Diltiazem 120 mg daily   *If you need a refill on your cardiac medications before your next appointment, please call your pharmacy*   Follow-Up: At [facility], you and your health needs are our priority.  As part of our continuing mission to provide you with exceptional heart care, we have created designated Provider Care Teams.  These Care Teams include your primary Cardiologist (physician) and Advanced Practice Providers (APPs -  Physician Assistants and Nurse Practitioners) who all work together to provide you with the care you need, when you need it.  We recommend signing up for the patient portal called "MyChart".  Sign up information is provided on this After Visit Summary.  MyChart is used to connect with patients for Virtual Visits (Telemedicine).  Patients are able to view lab/test results, encounter notes, upcoming appointments, etc.  Non-urgent messages can be sent to your provider as well.   To learn more about what you can do with  MyChart, go to [email or web address].    Your next appointment:   3 month(s)  The format for your next appointment:   In Person  Provider:   Eleonore Chiquito, MD      Time Spent with Patient: I have spent a total of 25 minutes with patient reviewing hospital notes, telemetry, EKGs, labs and examining the patient as well as establishing an assessment and plan that was discussed with the patient.  > 50% of time was spent in direct patient care.  Signed, Addison Naegeli. Audie Box, MD, [place]  [address], [facility], [place] [address] [phone]  08/14/2020 4:59 PM

## 2020-08-14 NOTE — Patient Instructions (Signed)
Medication Instructions:  Stop Amlodipine Start Diltiazem 120 mg daily   *If you need a refill on your cardiac medications before your next appointment, please call your pharmacy*   Follow-Up: At [facility], you and your health needs are our priority.  As part of our continuing mission to provide you with exceptional heart care, we have created designated Provider Care Teams.  These Care Teams include your primary Cardiologist (physician) and Advanced Practice Providers (APPs -  Physician Assistants and Nurse Practitioners) who all work together to provide you with the care you need, when you need it.  We recommend signing up for the patient portal called "MyChart".  Sign up information is provided on this After Visit Summary.  MyChart is used to connect with patients for Virtual Visits (Telemedicine).  Patients are able to view lab/test results, encounter notes, upcoming appointments, etc.  Non-urgent messages can be sent to your provider as well.   To learn more about what you can do with MyChart, go to [email or web address].    Your next appointment:   3 month(s)  The format for your next appointment:   In Person  Provider:   Eleonore Chiquito, MD

## 2020-08-23 DIAGNOSIS — R000 Tachycardia, unspecified: Secondary | ICD-10-CM | POA: Diagnosis not present

## 2020-08-23 DIAGNOSIS — R300 Dysuria: Secondary | ICD-10-CM | POA: Diagnosis not present

## 2020-08-23 DIAGNOSIS — R8290 Unspecified abnormal findings in urine: Secondary | ICD-10-CM | POA: Diagnosis not present

## 2020-08-23 DIAGNOSIS — J449 Chronic obstructive pulmonary disease, unspecified: Secondary | ICD-10-CM | POA: Diagnosis not present

## 2020-08-23 DIAGNOSIS — I10 Essential (primary) hypertension: Secondary | ICD-10-CM | POA: Diagnosis not present

## 2020-08-29 ENCOUNTER — Telehealth (HOSPITAL_COMMUNITY): Payer: Self-pay | Admitting: *Deleted

## 2020-09-02 ENCOUNTER — Encounter (HOSPITAL_COMMUNITY)
Admission: RE | Admit: 2020-09-02 | Discharge: 2020-09-02 | Disposition: A | Discharge status: Home / Self Care | Payer: PPO | Source: Ambulatory Visit | Attending: Pulmonary Disease | Admitting: Pulmonary Disease

## 2020-09-02 ENCOUNTER — Other Ambulatory Visit: Payer: Self-pay

## 2020-09-02 VITALS — BP 132/68 | HR 107 | Ht 65.0 in | Wt 171.3 lb

## 2020-09-02 DIAGNOSIS — R0602 Shortness of breath: Secondary | ICD-10-CM | POA: Insufficient documentation

## 2020-09-02 NOTE — Progress Notes (Signed)
Pulmonary Individual Treatment Plan  Patient Details  Name: Virginia Burns MRN: [id number] Date of Birth: [birth year] Referring Provider:   April Manson Pulmonary Rehab Walk Test from 09/02/2020 in [facility]  Referring Provider Dr. Halford Chessman      Initial Encounter Date:  Flowsheet Row Pulmonary Rehab Walk Test from 09/02/2020 in [facility]  Date 09/02/20      Visit Diagnosis: Shortness of breath  Patient's Home Medications on Admission:   Current Outpatient Medications:  .  AMBULATORY NON FORMULARY MEDICATION, Take 10 mg by mouth 3 (three) times daily before meals. Medication Name: Domperidone, Disp: 270 tablet, Rfl: 3 .  Ascorbic Acid (VITAMIN C) 1000 MG tablet, Take 1,000 mg by mouth daily., Disp: , Rfl:  .  aspirin EC 81 MG tablet, Take 81 mg by mouth every other day., Disp: , Rfl:  .  budesonide-formoterol (SYMBICORT) 160-4.5 MCG/ACT inhaler, Inhale 2 puffs into the lungs 2 (two) times daily., Disp: 1 Inhaler, Rfl: 5 .  calcium carbonate (OS-CAL - DOSED IN MG OF ELEMENTAL CALCIUM) 1250 (500 Ca) MG tablet, Take 1 tablet by mouth. 600mg  bid, Disp: , Rfl:  .  Cholecalciferol 125 MCG (5000 UT) TABS, Take by mouth., Disp: , Rfl:  .  Cranberry 1000 MG CAPS, Take by mouth., Disp: , Rfl:  .  denosumab (PROLIA) 60 MG/ML SOSY injection, Inject into the skin., Disp: , Rfl:  .  diltiazem (CARDIZEM CD) 120 MG 24 hr capsule, Take 1 capsule (120 mg total) by mouth daily., Disp: 90 capsule, Rfl: 3 .  Flaxseed, Linseed, (FLAX SEED OIL) 1000 MG CAPS, Take by mouth., Disp: , Rfl:  .  gabapentin (NEURONTIN) 600 MG tablet, Take 0.5 tablets by mouth 3 (three) times daily., Disp: , Rfl:  .  LORazepam (ATIVAN) 0.5 MG tablet, Take 0.5 mg by mouth as needed for anxiety. , Disp: , Rfl:  .  pantoprazole (PROTONIX) 40 MG tablet, Take 40 mg by mouth 2 (two) times daily. , Disp: , Rfl:  .  Probiotic CAPS, Take 1 capsule by mouth daily., Disp: , Rfl:   .  sucralfate (CARAFATE) 1 GM/10ML suspension, TAKE 2 TEASPOONFUL (10 MLS) BY MOUTH  4 TIMES DAILY WITH MEALS AND AT BEDTIME (Patient taking differently: daily as needed. TAKE 2 TEASPOONFUL (10 MLS) BY MOUTH  4 TIMES DAILY WITH MEALS AND AT BEDTIME), Disp: 960 mL, Rfl: 1 .  tiotropium (SPIRIVA) 18 MCG inhalation capsule, Place 1 capsule (18 mcg total) into inhaler and inhale daily., Disp: 90 capsule, Rfl: 3 .  vitamin B-12 (CYANOCOBALAMIN) 1000 MCG tablet, Take 2,000 mcg by mouth daily., Disp: , Rfl:   Past Medical History: Past Medical History:  Diagnosis Date  . Adjustment disorder with anxiety 08/17/2013  . Allergic rhinitis   . Anxiety state 08/17/2013   Overview:  IMPRESSION: hx of anxiety, lost a friend and has friend in ICU, using the ativan prn. had refilled 07/27/13  . Aortic ejection murmur 07/14/2018  . Benign neoplasm of ascending colon 12/07/2014  . Benign neoplasm of descending colon 12/07/2014  . Centrilobular emphysema ([place]) 12/07/2014  . Chronic hyponatremia 10/05/2018   Last Assessment & Plan:  Formatting of this note is different from the original. Recent Labs    10/05/18 0007 10/06/18 0504 10/07/18 0733  NA 132* 131* 135   Baseline ~ 132   -stable  . Chronic respiratory failure ([place]) 03/04/2015  . Closed fracture of sacrum and coccyx ([place]) 10/03/2018  Last Assessment & Plan:  Formatting of this note might be different from the original. Patient presented with progressively worsening lower back pain and inability to ambulate  MR: Acute fracture of the right greater than left sacral ala and S2 segment. CT: Nondisplaced, vertical and transverse acute sacral insufficiency Fractures  -s/p perQ fixation on 5/20  --Management per ortho primary team: P  . Complication of anesthesia    Versed- hard time working - "dinging for days" - Colonoscopy  . Constipation 12/09/2012   Formatting of this note might be different from the original. IMPRESSION: increased constipation: trial of amitiza, to  push fiber, can use mirlax prn.  eat 4 prunes a day. call prn. has had colonoscopy in past. is due next year for f/u  . COPD (chronic obstructive pulmonary disease) ([place]) 02/10/2013  . COPD (chronic obstructive pulmonary disease) with chronic bronchitis ([place]) 02/10/2013   Overnight pulse oximetry shows desaturations greater than 5 minutes at a time less than 88%.  Order signed for nocturnal O2 at 2 L/m February 21, 2016  Overview:  Overnight pulse oximetry shows desaturations greater than 5 minutes at a time less than 88%.  Order signed for nocturnal O2 at 2 L/m February 21, 2016 Overview:  Overnight pulse oximetry shows desaturations greater than 5 minutes at a time l  . COPD (chronic obstructive pulmonary disease) with emphysema ([place])   . Decreased mobility 01/30/2020  . 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 therapy 12/07/2014  . Diverticulosis of colon 10/12/2007   Qualifier: Diagnosis of  By: Nils Pyle CMA (AAMA), Mearl Latin    . Essential tremor 05/22/2013  . Gastroparesis   . GERD (gastroesophageal reflux disease)   . GERD with esophagitis 10/12/2007   Qualifier: Diagnosis of  By: Nils Pyle CMA (AAMA), Mearl Latin    Overview:  Overview:  Qualifier: Diagnosis of  By: Nils Pyle CMA Deborra Medina), Mearl Latin  Overview:  Overview:  Qualifier: Diagnosis of  By: Nils Pyle CMA (Deborra Medina), Mearl Latin   . History of tobacco abuse 09/18/2013  . Humerus distal fracture 01/24/2018  . Hx of colonic polyps   . Hypertension   . Hypertension, benign 07/19/2007   Qualifier: Diagnosis of  By: Garen Grams    Overview:  Overview:  STORY: The goal for blood pressure is less than 140/90.  If you are checking your blood pressure at home, please record it and bring it to your next office visit. Following the Dietary Approaches to Stop Hypertension (DASH) diet (3 servings of fruit and vegetables daily, whole grains, low sodium, low-fat proteins) and exercisin  . Impaired mobility and ADLs 01/30/2020  . Inability to walk 10/03/2018  . Insomnia 10/22/2012  . Intractable low  back pain 10/03/2018  . Multinodular goiter 06/01/2017  . 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 Obstructive sleep apnea (adult) (pediatric) 09/18/2013   Overview:  Last Assessment & Plan:  She has mild sleep apnea.  I have reviewed the recent sleep study results with the patient.  We discussed how sleep apnea can affect various health problems including risks for hypertension, cardiovascular disease, and diabetes.  We also discussed how sleep disruption can increase risks for accident, such as while driving.  Weight loss as a means of improving sl  . OSA (obstructive sleep apnea) 09/18/2013  . Osteoarthrosis 12/09/2012  . Osteopenia of multiple sites 10/12/2007   Qualifier: Diagnosis of  By: Nils Pyle CMA ([place]), Mearl Latin    . Osteoporosis   . Palpitations 07/14/2018  . Personal history of colonic polyps 10/07/2009   tubular adenoma  .  Tachycardia, paroxysmal ([place]) 07/04/2018  . Urinary tract infection   . Vitamin B12 deficiency   . Vitamin D deficiency 12/09/2012   Overview:  Overview:  IMPRESSION: Appropiate labs done today. We will send the results and adjust treatment as needed. Bone density discussed. There has  been an improvement in her bone density comparing the one in 2009 from the one 2011. From osteoporotic she went to osteopenic. She did take Actonel 3 years ago for several years and stopped it due to GI side effects. We will continue mantaining f    Tobacco Use: Social History   Tobacco Use  Smoking Status Former Smoker  . Packs/day: 1.00  . Years: 25.00  . Pack years: 25.00  . Types: Cigarettes  . Quit date: 05/18/2009  . Years since quitting: 11.3  Smokeless Tobacco Never Used    Labs: Recent Review Scientist, physiological    Labs for ITP Cardiac and Pulmonary Rehab Latest Ref Rng & Units 12/27/2012 11/21/2014   TCO2 0 - 100 mmol/L 27 26      Capillary Blood Glucose: No results found for: GLUCAP   Pulmonary Assessment Scores:  Pulmonary Assessment Scores    Row Name 09/02/20  1240         ADL UCSD   ADL Phase Entry           mMRC Score   mMRC Score 3           UCSD: Self-administered rating of dyspnea associated with activities of daily living (ADLs) 6-point scale (0 = "not at all" to 5 = "maximal or unable to do because of breathlessness")  Scoring Scores range from 0 to 120.  Minimally important difference is 5 units  CAT: CAT can identify the health impairment of COPD patients and is better correlated with disease progression.  CAT has a scoring range of zero to 40. The CAT score is classified into four groups of low (less than 10), medium (10 - 20), high (21-30) and very high (31-40) based on the impact level of disease on health status. A CAT score over 10 suggests significant symptoms.  A worsening CAT score could be explained by an exacerbation, poor medication adherence, poor inhaler technique, or progression of COPD or comorbid conditions.  CAT MCID is 2 points  mMRC: mMRC (Modified Medical Research Council) Dyspnea Scale is used to assess the degree of baseline functional disability in patients of respiratory disease due to dyspnea. No minimal important difference is established. A decrease in score of 1 point or greater is considered a positive change.   Pulmonary Function Assessment:  Pulmonary Function Assessment - 09/02/20 1059      Breath   Bilateral Breath Sounds Clear;Decreased    Shortness of Breath Yes;Limiting activity;Fear of Shortness of Breath           Exercise Target Goals: Exercise Program Goal: Individual exercise prescription set using results from initial 6 min walk test and THRR while considering  patient's activity barriers and safety.   Exercise Prescription Goal: Initial exercise prescription builds to 30-45 minutes a day of aerobic activity, 2-3 days per week.  Home exercise guidelines will be given to patient during program as part of exercise prescription that the participant will acknowledge.  Activity  Barriers & Risk Stratification:  Activity Barriers & Cardiac Risk Stratification - 09/02/20 1058      Activity Barriers & Cardiac Risk Stratification   Activity Barriers Arthritis;Joint Problems;Deconditioning;Muscular Weakness;Shortness of Breath  6 Minute Walk:  6 Minute Walk    Row Name 09/02/20 1228         6 Minute Walk   Phase Initial     Distance 581 feet     Walk Time 6 minutes     # of Rest Breaks 0     MPH 1.1     METS 1.96     RPE 13     Perceived Dyspnea  2     VO2 Peak 6.86     Symptoms Yes (comment)     Comments Pt stated she was very fatigued, had to take a couple of standing rest breaks     Resting HR 85 bpm     Resting BP 140/70     Resting Oxygen Saturation  99 %     Exercise Oxygen Saturation  during 6 min walk 96 %     Max Ex. HR 122 bpm     Max Ex. BP 170/78     2 Minute Post BP 154/70           Interval HR   1 Minute HR 110     2 Minute HR 117     3 Minute HR 120     4 Minute HR 120     5 Minute HR 122     6 Minute HR 122     2 Minute Post HR 97     Interval Heart Rate? Yes           Interval Oxygen   Interval Oxygen? Yes     Baseline Oxygen Saturation % 99 %     1 Minute Oxygen Saturation % 98 %     1 Minute Liters of Oxygen 2 L     2 Minute Oxygen Saturation % 96 %     2 Minute Liters of Oxygen 2 L     3 Minute Oxygen Saturation % 96 %     3 Minute Liters of Oxygen 2 L     4 Minute Oxygen Saturation % 96 %     4 Minute Liters of Oxygen 2 L     5 Minute Oxygen Saturation % 97 %     5 Minute Liters of Oxygen 2 L     6 Minute Oxygen Saturation % 97 %     6 Minute Liters of Oxygen 2 L     2 Minute Post Oxygen Saturation % 98 %     2 Minute Post Liters of Oxygen 2 L            Oxygen Initial Assessment:  Oxygen Initial Assessment - 09/02/20 1059      Home Oxygen   Home Oxygen Device Portable Concentrator;Home Concentrator    Sleep Oxygen Prescription Continuous    Liters per minute 2    Home Exercise Oxygen  Prescription Pulsed    Liters per minute 2    Home Resting Oxygen Prescription None    Compliance with Home Oxygen Use Yes      Initial 6 min Walk   Oxygen Used Continuous    Liters per minute 2      Program Oxygen Prescription   Program Oxygen Prescription Continuous    Liters per minute 2      Intervention   Short Term Goals To learn and exhibit compliance with exercise, home and travel O2 prescription;To learn and understand importance of monitoring SPO2 with pulse oximeter and demonstrate accurate use of the  pulse oximeter.;To learn and understand importance of maintaining oxygen saturations>88%;To learn and demonstrate proper pursed lip breathing techniques or other breathing techniques.;To learn and demonstrate proper use of respiratory medications    Long  Term Goals Exhibits compliance with exercise, home and travel O2 prescription;Verbalizes importance of monitoring SPO2 with pulse oximeter and return demonstration;Maintenance of O2 saturations>88%;Exhibits proper breathing techniques, such as pursed lip breathing or other method taught during program session;Compliance with respiratory medication;Demonstrates proper use of MDI's           Oxygen Re-Evaluation:   Oxygen Discharge (Final Oxygen Re-Evaluation):   Initial Exercise Prescription:  Initial Exercise Prescription - 09/02/20 1200      Date of Initial Exercise RX and Referring Provider   Date 09/02/20    Referring Provider Dr. Halford Chessman    Expected Discharge Date 11/07/20      Oxygen   Oxygen Continuous    Liters 2      NuStep   Level 1    SPM 75    Minutes 30      Prescription Details   Frequency (times per week) 2    Duration Progress to 30 minutes of continuous aerobic without signs/symptoms of physical distress      Intensity   THRR 40-80% of Max Heartrate 59-118    Ratings of Perceived Exertion 11-13    Perceived Dyspnea 0-4      Progression   Progression Continue to progress workloads to  maintain intensity without signs/symptoms of physical distress.      Resistance Training   Training Prescription Yes    Weight Red bands    Reps 10-15           Perform Capillary Blood Glucose checks as needed.  Exercise Prescription Changes:   Exercise Comments:   Exercise Goals and Review:  Exercise Goals    Row Name 09/02/20 1225             Exercise Goals   Increase Physical Activity Yes       Intervention Provide advice, education, support and counseling about physical activity/exercise needs.;Develop an individualized exercise prescription for aerobic and resistive training based on initial evaluation findings, risk stratification, comorbidities and participant's personal goals.       Expected Outcomes Short Term: Attend rehab on a regular basis to increase amount of physical activity.;Long Term: Add in home exercise to make exercise part of routine and to increase amount of physical activity.;Long Term: Exercising regularly at least 3-5 days a week.       Increase Strength and Stamina Yes       Intervention Provide advice, education, support and counseling about physical activity/exercise needs.;Develop an individualized exercise prescription for aerobic and resistive training based on initial evaluation findings, risk stratification, comorbidities and participant's personal goals.       Expected Outcomes Short Term: Increase workloads from initial exercise prescription for resistance, speed, and METs.;Short Term: Perform resistance training exercises routinely during rehab and add in resistance training at home;Long Term: Improve cardiorespiratory fitness, muscular endurance and strength as measured by increased METs and functional capacity (6MWT)       Able to understand and use rate of perceived exertion (RPE) scale Yes       Intervention Provide education and explanation on how to use RPE scale       Expected Outcomes Short Term: Able to use RPE daily in rehab to  express subjective intensity level;Long Term:  Able to use RPE to guide intensity level when  exercising independently       Able to understand and use Dyspnea scale Yes       Intervention Provide education and explanation on how to use Dyspnea scale       Expected Outcomes Short Term: Able to use Dyspnea scale daily in rehab to express subjective sense of shortness of breath during exertion;Long Term: Able to use Dyspnea scale to guide intensity level when exercising independently       Knowledge and understanding of Target Heart Rate Range (THRR) Yes       Intervention Provide education and explanation of THRR including how the numbers were predicted and where they are located for reference       Expected Outcomes Short Term: Able to state/look up THRR;Long Term: Able to use THRR to govern intensity when exercising independently;Short Term: Able to use daily as guideline for intensity in rehab       Understanding of Exercise Prescription Yes       Intervention Provide education, explanation, and written materials on patient's individual exercise prescription       Expected Outcomes Short Term: Able to explain program exercise prescription;Long Term: Able to explain home exercise prescription to exercise independently              Exercise Goals Re-Evaluation :   Discharge Exercise Prescription (Final Exercise Prescription Changes):   Nutrition:  Target Goals: Understanding of nutrition guidelines, daily intake of sodium 1500mg , cholesterol 200mg , calories 30% from fat and 7% or less from saturated fats, daily to have 5 or more servings of fruits and vegetables.  Biometrics:  Pre Biometrics - 09/02/20 1105      Pre Biometrics   Height 5\' 5"  (1.651 m)    Weight 77.7 kg    BMI (Calculated) 28.51    Grip Strength 24 kg            Nutrition Therapy Plan and Nutrition Goals:   Nutrition Assessments:  MEDIFICTS Score Key:  ?70 Need to make dietary changes   40-70 Heart  Healthy Diet  ? 40 Therapeutic Level Cholesterol Diet   Picture Your Plate Scores:  <86 Unhealthy dietary pattern with much room for improvement.  41-50 Dietary pattern unlikely to meet recommendations for good health and room for improvement.  51-60 More healthful dietary pattern, with some room for improvement.   >60 Healthy dietary pattern, although there may be some specific behaviors that could be improved.    Nutrition Goals Re-Evaluation:   Nutrition Goals Discharge (Final Nutrition Goals Re-Evaluation):   Psychosocial: Target Goals: Acknowledge presence or absence of significant depression and/or stress, maximize coping skills, provide positive support system. Participant is able to verbalize types and ability to use techniques and skills needed for reducing stress and depression.  Initial Review & Psychosocial Screening:  Initial Psych Review & Screening - 09/02/20 1101      Initial Review   Current issues with None Identified      Family Dynamics   Good Support System? Yes   friends and son     Barriers   Psychosocial barriers to participate in program There are no identifiable barriers or psychosocial needs.      Screening Interventions   Interventions Encouraged to exercise           Quality of Life Scores:  Scores of 19 and below usually indicate a poorer quality of life in these areas.  A difference of  2-3 points is a clinically meaningful difference.  A difference  of 2-3 points in the total score of the Quality of Life Index has been associated with significant improvement in overall quality of life, self-image, physical symptoms, and general health in studies assessing change in quality of life.  PHQ-9: Recent Review Flowsheet Data    Depression screen [facility] 2/9 09/02/2020   Decreased Interest 0   Down, Depressed, Hopeless 0   PHQ - 2 Score 0   Altered sleeping 0   Tired, decreased energy 0   Change in appetite 0   Feeling bad or failure about  yourself  0   Trouble concentrating 0   Moving slowly or fidgety/restless 0   Suicidal thoughts 0   PHQ-9 Score 0   Difficult doing work/chores Not difficult at all     Interpretation of Total Score  Total Score Depression Severity:  1-4 = Minimal depression, 5-9 = Mild depression, 10-14 = Moderate depression, 15-19 = Moderately severe depression, 20-27 = Severe depression   Psychosocial Evaluation and Intervention:  Psychosocial Evaluation - 09/02/20 1102      Psychosocial Evaluation & Interventions   Interventions Encouraged to exercise with the program and follow exercise prescription    Continue Psychosocial Services  No Follow up required           Psychosocial Re-Evaluation:   Psychosocial Discharge (Final Psychosocial Re-Evaluation):   Education: Education Goals: Education classes will be provided on a weekly basis, covering required topics. Participant will state understanding/return demonstration of topics presented.  Learning Barriers/Preferences:  Learning Barriers/Preferences - 09/02/20 1103      Learning Barriers/Preferences   Learning Barriers None    Learning Preferences Computer/Internet;Group Instruction;Individual Instruction;Pictoral;Skilled Demonstration;Audio;Verbal Instruction;Video;Written Material           Education Topics: Risk Factor Reduction:  -Group instruction that is supported by a PowerPoint presentation. Instructor discusses the definition of a risk factor, different risk factors for pulmonary disease, and how the heart and lungs work together.     Nutrition for Pulmonary Patient:  -Group instruction provided by PowerPoint slides, verbal discussion, and written materials to support subject matter. The instructor gives an explanation and review of healthy diet recommendations, which includes a discussion on weight management, recommendations for fruit and vegetable consumption, as well as protein, fluid, caffeine, fiber, sodium, sugar,  and alcohol. Tips for eating when patients are short of breath are discussed.   Pursed Lip Breathing:  -Group instruction that is supported by demonstration and informational handouts. Instructor discusses the benefits of pursed lip and diaphragmatic breathing and detailed demonstration on how to preform both.     Oxygen Safety:  -Group instruction provided by PowerPoint, verbal discussion, and written material to support subject matter. There is an overview of "What is Oxygen" and "Why do we need it".  Instructor also reviews how to create a safe environment for oxygen use, the importance of using oxygen as prescribed, and the risks of noncompliance. There is a brief discussion on traveling with oxygen and resources the patient may utilize.   Oxygen Equipment:  -Group instruction provided by [facility] Staff utilizing handouts, written materials, and equipment demonstrations.   Signs and Symptoms:  -Group instruction provided by written material and verbal discussion to support subject matter. Warning signs and symptoms of infection, stroke, and heart attack are reviewed and when to call the physician/911 reinforced. Tips for preventing the spread of infection discussed.   Advanced Directives:  -Group instruction provided by verbal instruction and written material to support subject matter. Instructor reviews Chartered loss adjuster  and proper instruction for filling out document.   Pulmonary Video:  -Group video education that reviews the importance of medication and oxygen compliance, exercise, good nutrition, pulmonary hygiene, and pursed lip and diaphragmatic breathing for the pulmonary patient.   Exercise for the Pulmonary Patient:  -Group instruction that is supported by a PowerPoint presentation. Instructor discusses benefits of exercise, core components of exercise, frequency, duration, and intensity of an exercise routine, importance of utilizing pulse oximetry during exercise,  safety while exercising, and options of places to exercise outside of rehab.     Pulmonary Medications:  -Verbally interactive group education provided by instructor with focus on inhaled medications and proper administration.   Anatomy and Physiology of the Respiratory System and Intimacy:  -Group instruction provided by PowerPoint, verbal discussion, and written material to support subject matter. Instructor reviews respiratory cycle and anatomical components of the respiratory system and their functions. Instructor also reviews differences in obstructive and restrictive respiratory diseases with examples of each. Intimacy, Sex, and Sexuality differences are reviewed with a discussion on how relationships can change when diagnosed with pulmonary disease. Common sexual concerns are reviewed.   MD DAY -A group question and answer session with a medical doctor that allows participants to ask questions that relate to their pulmonary disease state.   OTHER EDUCATION -Group or individual verbal, written, or video instructions that support the educational goals of the pulmonary rehab program.   Holiday Eating Survival Tips:  -Group instruction provided by PowerPoint slides, verbal discussion, and written materials to support subject matter. The instructor gives patients tips, tricks, and techniques to help them not only survive but enjoy the holidays despite the onslaught of food that accompanies the holidays.   Knowledge Questionnaire Score:   Core Components/Risk Factors/Patient Goals at Admission:  Personal Goals and Risk Factors at Admission - 09/02/20 1103      Core Components/Risk Factors/Patient Goals on Admission   Improve shortness of breath with ADL's Yes    Intervention Provide education, individualized exercise plan and daily activity instruction to help decrease symptoms of SOB with activities of daily living.    Expected Outcomes Short Term: Improve cardiorespiratory fitness  to achieve a reduction of symptoms when performing ADLs;Long Term: Be able to perform more ADLs without symptoms or delay the onset of symptoms           Core Components/Risk Factors/Patient Goals Review:   Goals and Risk Factor Review    Row Name 09/02/20 1103             Core Components/Risk Factors/Patient Goals Review   Personal Goals Review Increase knowledge of respiratory medications and ability to use respiratory devices properly.;Develop more efficient breathing techniques such as purse lipped breathing and diaphragmatic breathing and practicing self-pacing with activity.;Improve shortness of breath with ADL's              Core Components/Risk Factors/Patient Goals at Discharge (Final Review):   Goals and Risk Factor Review - 09/02/20 1103      Core Components/Risk Factors/Patient Goals Review   Personal Goals Review Increase knowledge of respiratory medications and ability to use respiratory devices properly.;Develop more efficient breathing techniques such as purse lipped breathing and diaphragmatic breathing and practicing self-pacing with activity.;Improve shortness of breath with ADL's           ITP Comments:   Comments:

## 2020-09-02 NOTE — Progress Notes (Signed)
Virginia Burns 75 y.o. female Pulmonary Rehab Orientation Note This patient who was referred to Pulmonary rehab by Dr. Halford Chessman with the diagnosis of shortness of breath arrived today in Cardiac and Pulmonary Rehab. She arrived with a rollator assistive device with steady gait. She does carry portable oxygen. Per pt, she uses oxygen continuously. Color good, skin warm and dry. Patient is oriented to time and place. Patient's medical history, psychosocial health, and medications reviewed. Psychosocial assessment reveals pt lives alone. Pt is currently the owner of a realty company, but due to a pelvis fracture 1.5 years ago and a recent wrist fracture she has been unable to show houses and has a friend she sends her referrals to. Pt hobbies include water color painting. Pt reports her stress level is low. Areas of stress/anxiety include Health and not being able to drive and walk without a walker.  Pt does not exhibit signs of depression. PHQ2/9 score 0/0. Pt shows good  coping skills with not done outlook .  Will continue to monitor and evaluate progress toward psychosocial goal(s) of continued mental well-being while participating in pulmonary rehab. Physical assessment reveals heart rate is tachycardic, she was recently placed on diltiazem for a rapid heart rate, breath sounds clear to auscultation, no wheezes, rales, or rhonchi, diminished. Grip strength equal, strong. Posterior tibial pulses present without peripheral edema. Patient reports she does take medications as prescribed. Patient states she follows a Regular diet. The patient reports no specific efforts to gain or lose weight.. Patient's weight will be monitored closely. Demonstration and practice of PLB using pulse oximeter. Patient able to return demonstration satisfactorily. Safety and hand hygiene in the exercise area reviewed with patient. Patient voices understanding of the information reviewed. Department expectations discussed with patient and  achievable goals were set. The patient shows enthusiasm about attending the program and we look forward to working with this nice lady. The patient completed a 6 min walk test today 09/02/2020 and to begin exercise on Tuesday, 09/10/2020 in the 1:15 pm exercise slot.  [id number]

## 2020-09-09 ENCOUNTER — Telehealth (HOSPITAL_COMMUNITY): Payer: Self-pay

## 2020-09-09 DIAGNOSIS — M25572 Pain in left ankle and joints of left foot: Secondary | ICD-10-CM | POA: Diagnosis not present

## 2020-09-09 DIAGNOSIS — S99912A Unspecified injury of left ankle, initial encounter: Secondary | ICD-10-CM | POA: Diagnosis not present

## 2020-09-09 DIAGNOSIS — N3000 Acute cystitis without hematuria: Secondary | ICD-10-CM | POA: Diagnosis not present

## 2020-09-09 DIAGNOSIS — M7989 Other specified soft tissue disorders: Secondary | ICD-10-CM | POA: Diagnosis not present

## 2020-09-09 NOTE — Telephone Encounter (Signed)
Pt called stating she twisted her ankle and it is swollen so she will not be at pulmonary rehab tomorrow 09/10/20. Encouraged pt to go get it checked out and to give [place] a call with the results and the doctors recommendations about exercise. Will cancel appointment for tomorrow.

## 2020-09-10 ENCOUNTER — Telehealth (HOSPITAL_COMMUNITY): Payer: Self-pay

## 2020-09-10 ENCOUNTER — Ambulatory Visit (HOSPITAL_COMMUNITY): Payer: PPO

## 2020-09-10 NOTE — Progress Notes (Signed)
Pulmonary Individual Treatment Plan  Patient Details  Name: Virginia Burns MRN: [id number] Date of Birth: [birth year] Referring Provider:   April Manson Pulmonary Rehab Walk Test from 09/02/2020 in [facility]  Referring Provider Dr. Halford Chessman      Initial Encounter Date:  Flowsheet Row Pulmonary Rehab Walk Test from 09/02/2020 in [facility]  Date 09/02/20      Visit Diagnosis: Shortness of breath  Patient's Home Medications on Admission:   Current Outpatient Medications:  .  AMBULATORY NON FORMULARY MEDICATION, Take 10 mg by mouth 3 (three) times daily before meals. Medication Name: Domperidone, Disp: 270 tablet, Rfl: 3 .  Ascorbic Acid (VITAMIN C) 1000 MG tablet, Take 1,000 mg by mouth daily., Disp: , Rfl:  .  aspirin EC 81 MG tablet, Take 81 mg by mouth every other day., Disp: , Rfl:  .  budesonide-formoterol (SYMBICORT) 160-4.5 MCG/ACT inhaler, Inhale 2 puffs into the lungs 2 (two) times daily., Disp: 1 Inhaler, Rfl: 5 .  calcium carbonate (OS-CAL - DOSED IN MG OF ELEMENTAL CALCIUM) 1250 (500 Ca) MG tablet, Take 1 tablet by mouth. 600mg  bid, Disp: , Rfl:  .  Cholecalciferol 125 MCG (5000 UT) TABS, Take by mouth., Disp: , Rfl:  .  Cranberry 1000 MG CAPS, Take by mouth., Disp: , Rfl:  .  denosumab (PROLIA) 60 MG/ML SOSY injection, Inject into the skin., Disp: , Rfl:  .  diltiazem (CARDIZEM CD) 120 MG 24 hr capsule, Take 1 capsule (120 mg total) by mouth daily., Disp: 90 capsule, Rfl: 3 .  Flaxseed, Linseed, (FLAX SEED OIL) 1000 MG CAPS, Take by mouth., Disp: , Rfl:  .  gabapentin (NEURONTIN) 600 MG tablet, Take 0.5 tablets by mouth 3 (three) times daily., Disp: , Rfl:  .  LORazepam (ATIVAN) 0.5 MG tablet, Take 0.5 mg by mouth as needed for anxiety. , Disp: , Rfl:  .  pantoprazole (PROTONIX) 40 MG tablet, Take 40 mg by mouth 2 (two) times daily. , Disp: , Rfl:  .  Probiotic CAPS, Take 1 capsule by mouth daily., Disp: , Rfl:   .  sucralfate (CARAFATE) 1 GM/10ML suspension, TAKE 2 TEASPOONFUL (10 MLS) BY MOUTH  4 TIMES DAILY WITH MEALS AND AT BEDTIME (Patient taking differently: daily as needed. TAKE 2 TEASPOONFUL (10 MLS) BY MOUTH  4 TIMES DAILY WITH MEALS AND AT BEDTIME), Disp: 960 mL, Rfl: 1 .  tiotropium (SPIRIVA) 18 MCG inhalation capsule, Place 1 capsule (18 mcg total) into inhaler and inhale daily., Disp: 90 capsule, Rfl: 3 .  vitamin B-12 (CYANOCOBALAMIN) 1000 MCG tablet, Take 2,000 mcg by mouth daily., Disp: , Rfl:   Past Medical History: Past Medical History:  Diagnosis Date  . Adjustment disorder with anxiety 08/17/2013  . Allergic rhinitis   . Anxiety state 08/17/2013   Overview:  IMPRESSION: hx of anxiety, lost a friend and has friend in ICU, using the ativan prn. had refilled 07/27/13  . Aortic ejection murmur 07/14/2018  . Benign neoplasm of ascending colon 12/07/2014  . Benign neoplasm of descending colon 12/07/2014  . Centrilobular emphysema ([place]) 12/07/2014  . Chronic hyponatremia 10/05/2018   Last Assessment & Plan:  Formatting of this note is different from the original. Recent Labs    10/05/18 0007 10/06/18 0504 10/07/18 0733  NA 132* 131* 135   Baseline ~ 132   -stable  . Chronic respiratory failure ([place]) 03/04/2015  . Closed fracture of sacrum and coccyx ([place]) 10/03/2018  Last Assessment & Plan:  Formatting of this note might be different from the original. Patient presented with progressively worsening lower back pain and inability to ambulate  MR: Acute fracture of the right greater than left sacral ala and S2 segment. CT: Nondisplaced, vertical and transverse acute sacral insufficiency Fractures  -s/p perQ fixation on 5/20  --Management per ortho primary team: P  . Complication of anesthesia    Versed- hard time working - "dinging for days" - Colonoscopy  . Constipation 12/09/2012   Formatting of this note might be different from the original. IMPRESSION: increased constipation: trial of amitiza, to  push fiber, can use mirlax prn.  eat 4 prunes a day. call prn. has had colonoscopy in past. is due next year for f/u  . COPD (chronic obstructive pulmonary disease) ([place]) 02/10/2013  . COPD (chronic obstructive pulmonary disease) with chronic bronchitis ([place]) 02/10/2013   Overnight pulse oximetry shows desaturations greater than 5 minutes at a time less than 88%.  Order signed for nocturnal O2 at 2 L/m February 21, 2016  Overview:  Overnight pulse oximetry shows desaturations greater than 5 minutes at a time less than 88%.  Order signed for nocturnal O2 at 2 L/m February 21, 2016 Overview:  Overnight pulse oximetry shows desaturations greater than 5 minutes at a time l  . COPD (chronic obstructive pulmonary disease) with emphysema ([place])   . Decreased mobility 01/30/2020  . 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 therapy 12/07/2014  . Diverticulosis of colon 10/12/2007   Qualifier: Diagnosis of  By: Nils Pyle CMA (AAMA), Mearl Latin    . Essential tremor 05/22/2013  . Gastroparesis   . GERD (gastroesophageal reflux disease)   . GERD with esophagitis 10/12/2007   Qualifier: Diagnosis of  By: Nils Pyle CMA (AAMA), Mearl Latin    Overview:  Overview:  Qualifier: Diagnosis of  By: Nils Pyle CMA Deborra Medina), Mearl Latin  Overview:  Overview:  Qualifier: Diagnosis of  By: Nils Pyle CMA (Deborra Medina), Mearl Latin   . History of tobacco abuse 09/18/2013  . Humerus distal fracture 01/24/2018  . Hx of colonic polyps   . Hypertension   . Hypertension, benign 07/19/2007   Qualifier: Diagnosis of  By: Garen Grams    Overview:  Overview:  STORY: The goal for blood pressure is less than 140/90.  If you are checking your blood pressure at home, please record it and bring it to your next office visit. Following the Dietary Approaches to Stop Hypertension (DASH) diet (3 servings of fruit and vegetables daily, whole grains, low sodium, low-fat proteins) and exercisin  . Impaired mobility and ADLs 01/30/2020  . Inability to walk 10/03/2018  . Insomnia 10/22/2012  . Intractable low  back pain 10/03/2018  . Multinodular goiter 06/01/2017  . 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 Obstructive sleep apnea (adult) (pediatric) 09/18/2013   Overview:  Last Assessment & Plan:  She has mild sleep apnea.  I have reviewed the recent sleep study results with the patient.  We discussed how sleep apnea can affect various health problems including risks for hypertension, cardiovascular disease, and diabetes.  We also discussed how sleep disruption can increase risks for accident, such as while driving.  Weight loss as a means of improving sl  . OSA (obstructive sleep apnea) 09/18/2013  . Osteoarthrosis 12/09/2012  . Osteopenia of multiple sites 10/12/2007   Qualifier: Diagnosis of  By: Nils Pyle CMA ([place]), Mearl Latin    . Osteoporosis   . Palpitations 07/14/2018  . Personal history of colonic polyps 10/07/2009   tubular adenoma  .  Tachycardia, paroxysmal ([place]) 07/04/2018  . Urinary tract infection   . Vitamin B12 deficiency   . Vitamin D deficiency 12/09/2012   Overview:  Overview:  IMPRESSION: Appropiate labs done today. We will send the results and adjust treatment as needed. Bone density discussed. There has  been an improvement in her bone density comparing the one in 2009 from the one 2011. From osteoporotic she went to osteopenic. She did take Actonel 3 years ago for several years and stopped it due to GI side effects. We will continue mantaining f    Tobacco Use: Social History   Tobacco Use  Smoking Status Former Smoker  . Packs/day: 1.00  . Years: 25.00  . Pack years: 25.00  . Types: Cigarettes  . Quit date: 05/18/2009  . Years since quitting: 11.3  Smokeless Tobacco Never Used    Labs: Recent Review Scientist, physiological    Labs for ITP Cardiac and Pulmonary Rehab Latest Ref Rng & Units 12/27/2012 11/21/2014   TCO2 0 - 100 mmol/L 27 26      Capillary Blood Glucose: No results found for: GLUCAP   Pulmonary Assessment Scores:  Pulmonary Assessment Scores    Row Name 09/02/20  1240         ADL UCSD   ADL Phase Entry           mMRC Score   mMRC Score 3           UCSD: Self-administered rating of dyspnea associated with activities of daily living (ADLs) 6-point scale (0 = "not at all" to 5 = "maximal or unable to do because of breathlessness")  Scoring Scores range from 0 to 120.  Minimally important difference is 5 units  CAT: CAT can identify the health impairment of COPD patients and is better correlated with disease progression.  CAT has a scoring range of zero to 40. The CAT score is classified into four groups of low (less than 10), medium (10 - 20), high (21-30) and very high (31-40) based on the impact level of disease on health status. A CAT score over 10 suggests significant symptoms.  A worsening CAT score could be explained by an exacerbation, poor medication adherence, poor inhaler technique, or progression of COPD or comorbid conditions.  CAT MCID is 2 points  mMRC: mMRC (Modified Medical Research Council) Dyspnea Scale is used to assess the degree of baseline functional disability in patients of respiratory disease due to dyspnea. No minimal important difference is established. A decrease in score of 1 point or greater is considered a positive change.   Pulmonary Function Assessment:  Pulmonary Function Assessment - 09/02/20 1059      Breath   Bilateral Breath Sounds Clear;Decreased    Shortness of Breath Yes;Limiting activity;Fear of Shortness of Breath           Exercise Target Goals: Exercise Program Goal: Individual exercise prescription set using results from initial 6 min walk test and THRR while considering  patient's activity barriers and safety.   Exercise Prescription Goal: Initial exercise prescription builds to 30-45 minutes a day of aerobic activity, 2-3 days per week.  Home exercise guidelines will be given to patient during program as part of exercise prescription that the participant will acknowledge.  Activity  Barriers & Risk Stratification:  Activity Barriers & Cardiac Risk Stratification - 09/02/20 1058      Activity Barriers & Cardiac Risk Stratification   Activity Barriers Arthritis;Joint Problems;Deconditioning;Muscular Weakness;Shortness of Breath  6 Minute Walk:  6 Minute Walk    Row Name 09/02/20 1228         6 Minute Walk   Phase Initial     Distance 581 feet     Walk Time 6 minutes     # of Rest Breaks 0     MPH 1.1     METS 1.96     RPE 13     Perceived Dyspnea  2     VO2 Peak 6.86     Symptoms Yes (comment)     Comments Pt stated she was very fatigued, had to take a couple of standing rest breaks     Resting HR 85 bpm     Resting BP 140/70     Resting Oxygen Saturation  99 %     Exercise Oxygen Saturation  during 6 min walk 96 %     Max Ex. HR 122 bpm     Max Ex. BP 170/78     2 Minute Post BP 154/70           Interval HR   1 Minute HR 110     2 Minute HR 117     3 Minute HR 120     4 Minute HR 120     5 Minute HR 122     6 Minute HR 122     2 Minute Post HR 97     Interval Heart Rate? Yes           Interval Oxygen   Interval Oxygen? Yes     Baseline Oxygen Saturation % 99 %     1 Minute Oxygen Saturation % 98 %     1 Minute Liters of Oxygen 2 L     2 Minute Oxygen Saturation % 96 %     2 Minute Liters of Oxygen 2 L     3 Minute Oxygen Saturation % 96 %     3 Minute Liters of Oxygen 2 L     4 Minute Oxygen Saturation % 96 %     4 Minute Liters of Oxygen 2 L     5 Minute Oxygen Saturation % 97 %     5 Minute Liters of Oxygen 2 L     6 Minute Oxygen Saturation % 97 %     6 Minute Liters of Oxygen 2 L     2 Minute Post Oxygen Saturation % 98 %     2 Minute Post Liters of Oxygen 2 L            Oxygen Initial Assessment:  Oxygen Initial Assessment - 09/02/20 1059      Home Oxygen   Home Oxygen Device Portable Concentrator;Home Concentrator    Sleep Oxygen Prescription Continuous    Liters per minute 2    Home Exercise Oxygen  Prescription Pulsed    Liters per minute 2    Home Resting Oxygen Prescription None    Compliance with Home Oxygen Use Yes      Initial 6 min Walk   Oxygen Used Continuous    Liters per minute 2      Program Oxygen Prescription   Program Oxygen Prescription Continuous    Liters per minute 2      Intervention   Short Term Goals To learn and exhibit compliance with exercise, home and travel O2 prescription;To learn and understand importance of monitoring SPO2 with pulse oximeter and demonstrate accurate use of the  pulse oximeter.;To learn and understand importance of maintaining oxygen saturations>88%;To learn and demonstrate proper pursed lip breathing techniques or other breathing techniques.;To learn and demonstrate proper use of respiratory medications    Long  Term Goals Exhibits compliance with exercise, home and travel O2 prescription;Verbalizes importance of monitoring SPO2 with pulse oximeter and return demonstration;Maintenance of O2 saturations>88%;Exhibits proper breathing techniques, such as pursed lip breathing or other method taught during program session;Compliance with respiratory medication;Demonstrates proper use of MDI's           Oxygen Re-Evaluation:  Oxygen Re-Evaluation    Row Name 09/09/20 1641             Program Oxygen Prescription   Program Oxygen Prescription Continuous       Liters per minute 2               Home Oxygen   Home Oxygen Device Portable Concentrator;Home Concentrator       Sleep Oxygen Prescription Continuous       Liters per minute 2       Home Exercise Oxygen Prescription Pulsed       Liters per minute 2       Home Resting Oxygen Prescription None       Compliance with Home Oxygen Use Yes               Goals/Expected Outcomes   Short Term Goals To learn and exhibit compliance with exercise, home and travel O2 prescription;To learn and understand importance of monitoring SPO2 with pulse oximeter and demonstrate accurate use of  the pulse oximeter.;To learn and understand importance of maintaining oxygen saturations>88%;To learn and demonstrate proper pursed lip breathing techniques or other breathing techniques.;To learn and demonstrate proper use of respiratory medications       Long  Term Goals Exhibits compliance with exercise, home and travel O2 prescription;Verbalizes importance of monitoring SPO2 with pulse oximeter and return demonstration;Maintenance of O2 saturations>88%;Exhibits proper breathing techniques, such as pursed lip breathing or other method taught during program session;Compliance with respiratory medication;Demonstrates proper use of MDI's       Goals/Expected Outcomes Compliance with monitoring oxygen saturation and maintaining above 88%. Also for pt to understand the importance of pursed lip breathing and when to use it.              Oxygen Discharge (Final Oxygen Re-Evaluation):  Oxygen Re-Evaluation - 09/09/20 1641      Program Oxygen Prescription   Program Oxygen Prescription Continuous    Liters per minute 2      Home Oxygen   Home Oxygen Device Portable Concentrator;Home Concentrator    Sleep Oxygen Prescription Continuous    Liters per minute 2    Home Exercise Oxygen Prescription Pulsed    Liters per minute 2    Home Resting Oxygen Prescription None    Compliance with Home Oxygen Use Yes      Goals/Expected Outcomes   Short Term Goals To learn and exhibit compliance with exercise, home and travel O2 prescription;To learn and understand importance of monitoring SPO2 with pulse oximeter and demonstrate accurate use of the pulse oximeter.;To learn and understand importance of maintaining oxygen saturations>88%;To learn and demonstrate proper pursed lip breathing techniques or other breathing techniques.;To learn and demonstrate proper use of respiratory medications    Long  Term Goals Exhibits compliance with exercise, home and travel O2 prescription;Verbalizes importance of  monitoring SPO2 with pulse oximeter and return demonstration;Maintenance of O2 saturations>88%;Exhibits proper breathing techniques, such as  pursed lip breathing or other method taught during program session;Compliance with respiratory medication;Demonstrates proper use of MDI's    Goals/Expected Outcomes Compliance with monitoring oxygen saturation and maintaining above 88%. Also for pt to understand the importance of pursed lip breathing and when to use it.           Initial Exercise Prescription:  Initial Exercise Prescription - 09/02/20 1200      Date of Initial Exercise RX and Referring Provider   Date 09/02/20    Referring Provider Dr. Halford Chessman    Expected Discharge Date 11/07/20      Oxygen   Oxygen Continuous    Liters 2      NuStep   Level 1    SPM 75    Minutes 30      Prescription Details   Frequency (times per week) 2    Duration Progress to 30 minutes of continuous aerobic without signs/symptoms of physical distress      Intensity   THRR 40-80% of Max Heartrate 59-118    Ratings of Perceived Exertion 11-13    Perceived Dyspnea 0-4      Progression   Progression Continue to progress workloads to maintain intensity without signs/symptoms of physical distress.      Resistance Training   Training Prescription Yes    Weight Red bands    Reps 10-15           Perform Capillary Blood Glucose checks as needed.  Exercise Prescription Changes:   Exercise Comments:   Exercise Goals and Review:  Exercise Goals    Row Name 09/02/20 1225             Exercise Goals   Increase Physical Activity Yes       Intervention Provide advice, education, support and counseling about physical activity/exercise needs.;Develop an individualized exercise prescription for aerobic and resistive training based on initial evaluation findings, risk stratification, comorbidities and participant's personal goals.       Expected Outcomes Short Term: Attend rehab on a regular basis to  increase amount of physical activity.;Long Term: Add in home exercise to make exercise part of routine and to increase amount of physical activity.;Long Term: Exercising regularly at least 3-5 days a week.       Increase Strength and Stamina Yes       Intervention Provide advice, education, support and counseling about physical activity/exercise needs.;Develop an individualized exercise prescription for aerobic and resistive training based on initial evaluation findings, risk stratification, comorbidities and participant's personal goals.       Expected Outcomes Short Term: Increase workloads from initial exercise prescription for resistance, speed, and METs.;Short Term: Perform resistance training exercises routinely during rehab and add in resistance training at home;Long Term: Improve cardiorespiratory fitness, muscular endurance and strength as measured by increased METs and functional capacity (6MWT)       Able to understand and use rate of perceived exertion (RPE) scale Yes       Intervention Provide education and explanation on how to use RPE scale       Expected Outcomes Short Term: Able to use RPE daily in rehab to express subjective intensity level;Long Term:  Able to use RPE to guide intensity level when exercising independently       Able to understand and use Dyspnea scale Yes       Intervention Provide education and explanation on how to use Dyspnea scale       Expected Outcomes Short Term: Able to use  Dyspnea scale daily in rehab to express subjective sense of shortness of breath during exertion;Long Term: Able to use Dyspnea scale to guide intensity level when exercising independently       Knowledge and understanding of Target Heart Rate Range (THRR) Yes       Intervention Provide education and explanation of THRR including how the numbers were predicted and where they are located for reference       Expected Outcomes Short Term: Able to state/look up THRR;Long Term: Able to use THRR to  govern intensity when exercising independently;Short Term: Able to use daily as guideline for intensity in rehab       Understanding of Exercise Prescription Yes       Intervention Provide education, explanation, and written materials on patient's individual exercise prescription       Expected Outcomes Short Term: Able to explain program exercise prescription;Long Term: Able to explain home exercise prescription to exercise independently              Exercise Goals Re-Evaluation :  Exercise Goals Re-Evaluation    Row Name 09/09/20 1639             Exercise Goal Re-Evaluation   Exercise Goals Review Increase Physical Activity;Increase Strength and Stamina;Able to understand and use rate of perceived exertion (RPE) scale;Able to understand and use Dyspnea scale;Knowledge and understanding of Target Heart Rate Range (THRR);Understanding of Exercise Prescription       Comments Pt is scheduled to start exercise this week. We will monitor and progress as she is able.       Expected Outcomes Through exercise at rehab and home, the patient will decrease shortness of breath with daily activities and feel confident in carrying out an exercise regimn at home.              Discharge Exercise Prescription (Final Exercise Prescription Changes):   Nutrition:  Target Goals: Understanding of nutrition guidelines, daily intake of sodium 1500mg , cholesterol 200mg , calories 30% from fat and 7% or less from saturated fats, daily to have 5 or more servings of fruits and vegetables.  Biometrics:  Pre Biometrics - 09/02/20 1105      Pre Biometrics   Height 5\' 5"  (1.651 m)    Weight 77.7 kg    BMI (Calculated) 28.51    Grip Strength 24 kg            Nutrition Therapy Plan and Nutrition Goals:   Nutrition Assessments:  MEDIFICTS Score Key:  ?70 Need to make dietary changes   40-70 Heart Healthy Diet  ? 40 Therapeutic Level Cholesterol Diet   Picture Your Plate Scores:  [id number]  Unhealthy dietary pattern with much room for improvement.  41-50 Dietary pattern unlikely to meet recommendations for good health and room for improvement.  51-60 More healthful dietary pattern, with some room for improvement.   >60 Healthy dietary pattern, although there may be some specific behaviors that could be improved.    Nutrition Goals Re-Evaluation:   Nutrition Goals Discharge (Final Nutrition Goals Re-Evaluation):   Psychosocial: Target Goals: Acknowledge presence or absence of significant depression and/or stress, maximize coping skills, provide positive support system. Participant is able to verbalize types and ability to use techniques and skills needed for reducing stress and depression.  Initial Review & Psychosocial Screening:  Initial Psych Review & Screening - 09/02/20 1101      Initial Review   Current issues with None Identified      Family Dynamics  Good Support System? Yes   friends and son     Barriers   Psychosocial barriers to participate in program There are no identifiable barriers or psychosocial needs.      Screening Interventions   Interventions Encouraged to exercise           Quality of Life Scores:  Scores of 19 and below usually indicate a poorer quality of life in these areas.  A difference of  2-3 points is a clinically meaningful difference.  A difference of 2-3 points in the total score of the Quality of Life Index has been associated with significant improvement in overall quality of life, self-image, physical symptoms, and general health in studies assessing change in quality of life.  PHQ-9: Recent Review Flowsheet Data    Depression screen [facility] 2/9 09/02/2020   Decreased Interest 0   Down, Depressed, Hopeless 0   PHQ - 2 Score 0   Altered sleeping 0   Tired, decreased energy 0   Change in appetite 0   Feeling bad or failure about yourself  0   Trouble concentrating 0   Moving slowly or fidgety/restless 0   Suicidal  thoughts 0   PHQ-9 Score 0   Difficult doing work/chores Not difficult at all     Interpretation of Total Score  Total Score Depression Severity:  1-4 = Minimal depression, 5-9 = Mild depression, 10-14 = Moderate depression, 15-19 = Moderately severe depression, 20-27 = Severe depression   Psychosocial Evaluation and Intervention:  Psychosocial Evaluation - 09/02/20 1102      Psychosocial Evaluation & Interventions   Interventions Encouraged to exercise with the program and follow exercise prescription    Continue Psychosocial Services  No Follow up required           Psychosocial Re-Evaluation:   Psychosocial Discharge (Final Psychosocial Re-Evaluation):   Education: Education Goals: Education classes will be provided on a weekly basis, covering required topics. Participant will state understanding/return demonstration of topics presented.  Learning Barriers/Preferences:  Learning Barriers/Preferences - 09/02/20 1103      Learning Barriers/Preferences   Learning Barriers None    Learning Preferences Computer/Internet;Group Instruction;Individual Instruction;Pictoral;Skilled Demonstration;Audio;Verbal Instruction;Video;Written Material           Education Topics: Risk Factor Reduction:  -Group instruction that is supported by a PowerPoint presentation. Instructor discusses the definition of a risk factor, different risk factors for pulmonary disease, and how the heart and lungs work together.     Nutrition for Pulmonary Patient:  -Group instruction provided by PowerPoint slides, verbal discussion, and written materials to support subject matter. The instructor gives an explanation and review of healthy diet recommendations, which includes a discussion on weight management, recommendations for fruit and vegetable consumption, as well as protein, fluid, caffeine, fiber, sodium, sugar, and alcohol. Tips for eating when patients are short of breath are discussed.   Pursed  Lip Breathing:  -Group instruction that is supported by demonstration and informational handouts. Instructor discusses the benefits of pursed lip and diaphragmatic breathing and detailed demonstration on how to preform both.     Oxygen Safety:  -Group instruction provided by PowerPoint, verbal discussion, and written material to support subject matter. There is an overview of "What is Oxygen" and "Why do we need it".  Instructor also reviews how to create a safe environment for oxygen use, the importance of using oxygen as prescribed, and the risks of noncompliance. There is a brief discussion on traveling with oxygen and resources the patient may utilize.  Oxygen Equipment:  -Group instruction provided by [facility] Staff utilizing handouts, written materials, and equipment demonstrations.   Signs and Symptoms:  -Group instruction provided by written material and verbal discussion to support subject matter. Warning signs and symptoms of infection, stroke, and heart attack are reviewed and when to call the physician/911 reinforced. Tips for preventing the spread of infection discussed.   Advanced Directives:  -Group instruction provided by verbal instruction and written material to support subject matter. Instructor reviews Advanced Directive laws and proper instruction for filling out document.   Pulmonary Video:  -Group video education that reviews the importance of medication and oxygen compliance, exercise, good nutrition, pulmonary hygiene, and pursed lip and diaphragmatic breathing for the pulmonary patient.   Exercise for the Pulmonary Patient:  -Group instruction that is supported by a PowerPoint presentation. Instructor discusses benefits of exercise, core components of exercise, frequency, duration, and intensity of an exercise routine, importance of utilizing pulse oximetry during exercise, safety while exercising, and options of places to exercise outside of rehab.      Pulmonary Medications:  -Verbally interactive group education provided by instructor with focus on inhaled medications and proper administration.   Anatomy and Physiology of the Respiratory System and Intimacy:  -Group instruction provided by PowerPoint, verbal discussion, and written material to support subject matter. Instructor reviews respiratory cycle and anatomical components of the respiratory system and their functions. Instructor also reviews differences in obstructive and restrictive respiratory diseases with examples of each. Intimacy, Sex, and Sexuality differences are reviewed with a discussion on how relationships can change when diagnosed with pulmonary disease. Common sexual concerns are reviewed.   MD DAY -A group question and answer session with a medical doctor that allows participants to ask questions that relate to their pulmonary disease state.   OTHER EDUCATION -Group or individual verbal, written, or video instructions that support the educational goals of the pulmonary rehab program.   Holiday Eating Survival Tips:  -Group instruction provided by PowerPoint slides, verbal discussion, and written materials to support subject matter. The instructor gives patients tips, tricks, and techniques to help them not only survive but enjoy the holidays despite the onslaught of food that accompanies the holidays.   Knowledge Questionnaire Score:   Core Components/Risk Factors/Patient Goals at Admission:  Personal Goals and Risk Factors at Admission - 09/02/20 1103      Core Components/Risk Factors/Patient Goals on Admission   Improve shortness of breath with ADL's Yes    Intervention Provide education, individualized exercise plan and daily activity instruction to help decrease symptoms of SOB with activities of daily living.    Expected Outcomes Short Term: Improve cardiorespiratory fitness to achieve a reduction of symptoms when performing ADLs;Long Term: Be able to  perform more ADLs without symptoms or delay the onset of symptoms           Core Components/Risk Factors/Patient Goals Review:   Goals and Risk Factor Review    Row Name 09/02/20 1103             Core Components/Risk Factors/Patient Goals Review   Personal Goals Review Increase knowledge of respiratory medications and ability to use respiratory devices properly.;Develop more efficient breathing techniques such as purse lipped breathing and diaphragmatic breathing and practicing self-pacing with activity.;Improve shortness of breath with ADL's              Core Components/Risk Factors/Patient Goals at Discharge (Final Review):   Goals and Risk Factor Review - 09/02/20 1103  Core Components/Risk Factors/Patient Goals Review   Personal Goals Review Increase knowledge of respiratory medications and ability to use respiratory devices properly.;Develop more efficient breathing techniques such as purse lipped breathing and diaphragmatic breathing and practicing self-pacing with activity.;Improve shortness of breath with ADL's           ITP Comments:   Comments: ITP REVIEW Pt is making expected progress toward pulmonary rehab goals after completing 0 sessions. Recommend continued exercise, life style modification, education, and utilization of breathing techniques to increase stamina and strength and decrease shortness of breath with exertion.

## 2020-09-10 NOTE — Telephone Encounter (Signed)
Pt called stating she went to the doctor to get her foot checked out. She was told to wear a boot and put ice on it until the swelling goes down. Once the swelling goes down and she feels comfortable with walking she can return to rehab. Told pt to call us the beginning of next week to let us know how her foot is. Will cancel appointment for this coming Thursday 09/12/20.

## 2020-09-12 ENCOUNTER — Inpatient Hospital Stay (HOSPITAL_COMMUNITY)
Admission: RE | Admit: 2020-09-12 | Discharge: 2020-09-12 | Disposition: A | Discharge status: Home / Self Care | Payer: PPO | Source: Ambulatory Visit

## 2020-09-12 DIAGNOSIS — R0602 Shortness of breath: Secondary | ICD-10-CM

## 2020-09-16 ENCOUNTER — Telehealth (HOSPITAL_COMMUNITY): Payer: Self-pay

## 2020-09-16 MED ORDER — DILTIAZEM HCL ER COATED BEADS 240 MG PO CP24: 240.0000 mg | ORAL_CAPSULE | Freq: Every day | ORAL | 3 refills | Status: DC

## 2020-09-16 NOTE — Telephone Encounter (Signed)
Pt called stating she was told by a doctor to be non weight bearing on her ankle, due to sprain, for at least 4 weeks. At this time we will discharge pt. Pt will call back when she is cleared for exercise and we will get her rescheduled. Pt voiced understanding.

## 2020-09-17 ENCOUNTER — Ambulatory Visit (HOSPITAL_COMMUNITY): Payer: PPO

## 2020-09-17 DIAGNOSIS — G4733 Obstructive sleep apnea (adult) (pediatric): Secondary | ICD-10-CM | POA: Diagnosis not present

## 2020-09-19 ENCOUNTER — Ambulatory Visit (HOSPITAL_COMMUNITY): Payer: PPO

## 2020-09-24 ENCOUNTER — Ambulatory Visit (HOSPITAL_COMMUNITY): Payer: PPO

## 2020-09-24 DIAGNOSIS — Z7189 Other specified counseling: Secondary | ICD-10-CM | POA: Diagnosis not present

## 2020-09-24 DIAGNOSIS — Z8639 Personal history of other endocrine, nutritional and metabolic disease: Secondary | ICD-10-CM | POA: Diagnosis not present

## 2020-09-24 DIAGNOSIS — M818 Other osteoporosis without current pathological fracture: Secondary | ICD-10-CM | POA: Diagnosis not present

## 2020-09-24 DIAGNOSIS — Z87311 Personal history of (healed) other pathological fracture: Secondary | ICD-10-CM | POA: Diagnosis not present

## 2020-09-26 ENCOUNTER — Ambulatory Visit (HOSPITAL_COMMUNITY): Payer: PPO

## 2020-09-26 DIAGNOSIS — S93492A Sprain of other ligament of left ankle, initial encounter: Secondary | ICD-10-CM | POA: Diagnosis not present

## 2020-09-27 ENCOUNTER — Telehealth: Payer: Self-pay | Admitting: Pulmonary Disease

## 2020-09-27 NOTE — Telephone Encounter (Signed)
Routing to [facility] as they do the CMNs  Vallarie Mare, please advise if you have seen a CMN for this patient. Thanks! Also [employer] should be able to see our office notes so not should why they are asking to fax them over if im not mistaken?

## 2020-09-27 NOTE — Telephone Encounter (Signed)
As of now I do not have this CMN it maybe up front but I have not got it yet

## 2020-10-01 ENCOUNTER — Ambulatory Visit (HOSPITAL_COMMUNITY): Payer: PPO

## 2020-10-01 NOTE — Telephone Encounter (Signed)
I called the number that was put in the order and they are going to fax to my office

## 2020-10-01 NOTE — Telephone Encounter (Signed)
No I have not received a cmn on this patient Lenna Sciara is on vacation and I have called the main number 2 times and it just rings then hangs up I will send a community message to [employer]

## 2020-10-01 NOTE — Telephone Encounter (Signed)
Virginia Burns, please advise if you have received this CMN yet. If not, please advise if you are able to call and have it faxed to your office to complete it. Thanks.

## 2020-10-03 ENCOUNTER — Ambulatory Visit (HOSPITAL_COMMUNITY): Payer: PPO

## 2020-10-04 NOTE — Telephone Encounter (Signed)
Virginia Burns, was this received yet?

## 2020-10-04 NOTE — Telephone Encounter (Signed)
No not yet still havent received anything and I called and told them

## 2020-10-04 NOTE — Telephone Encounter (Signed)
Will continue to keep message open to follow up until fax is received.

## 2020-10-08 ENCOUNTER — Ambulatory Visit (HOSPITAL_COMMUNITY): Payer: PPO

## 2020-10-09 NOTE — Telephone Encounter (Signed)
Nope I never got it could have went straight to the provider but I have not received it

## 2020-10-09 NOTE — Telephone Encounter (Signed)
I have just got the form from [employer] and I have faxed the last ov note and filled out the form nothing on this form for the doctor to sign

## 2020-10-09 NOTE — Telephone Encounter (Signed)
Virginia Burns, was this every received?  Thanks.

## 2020-10-10 ENCOUNTER — Ambulatory Visit (HOSPITAL_COMMUNITY): Payer: PPO

## 2020-10-10 DIAGNOSIS — S93492D Sprain of other ligament of left ankle, subsequent encounter: Secondary | ICD-10-CM | POA: Diagnosis not present

## 2020-10-15 ENCOUNTER — Ambulatory Visit (HOSPITAL_COMMUNITY): Payer: PPO

## 2020-10-15 DIAGNOSIS — H26493 Other secondary cataract, bilateral: Secondary | ICD-10-CM | POA: Diagnosis not present

## 2020-10-17 ENCOUNTER — Ambulatory Visit (HOSPITAL_COMMUNITY): Payer: PPO

## 2020-10-18 DIAGNOSIS — B351 Tinea unguium: Secondary | ICD-10-CM | POA: Diagnosis not present

## 2020-10-18 DIAGNOSIS — M65872 Other synovitis and tenosynovitis, left ankle and foot: Secondary | ICD-10-CM | POA: Diagnosis not present

## 2020-10-18 DIAGNOSIS — M65871 Other synovitis and tenosynovitis, right ankle and foot: Secondary | ICD-10-CM | POA: Diagnosis not present

## 2020-10-18 DIAGNOSIS — I739 Peripheral vascular disease, unspecified: Secondary | ICD-10-CM | POA: Diagnosis not present

## 2020-10-22 ENCOUNTER — Ambulatory Visit (HOSPITAL_COMMUNITY): Payer: PPO

## 2020-10-24 ENCOUNTER — Ambulatory Visit (HOSPITAL_COMMUNITY): Payer: PPO

## 2020-10-29 ENCOUNTER — Ambulatory Visit (HOSPITAL_COMMUNITY): Payer: PPO

## 2020-10-31 ENCOUNTER — Ambulatory Visit (HOSPITAL_COMMUNITY): Payer: PPO

## 2020-11-04 DIAGNOSIS — S93492D Sprain of other ligament of left ankle, subsequent encounter: Secondary | ICD-10-CM | POA: Diagnosis not present

## 2020-11-05 ENCOUNTER — Ambulatory Visit (HOSPITAL_COMMUNITY): Payer: PPO

## 2020-11-05 DIAGNOSIS — H26493 Other secondary cataract, bilateral: Secondary | ICD-10-CM | POA: Diagnosis not present

## 2020-11-05 DIAGNOSIS — H26492 Other secondary cataract, left eye: Secondary | ICD-10-CM | POA: Diagnosis not present

## 2020-11-07 ENCOUNTER — Ambulatory Visit (HOSPITAL_COMMUNITY): Payer: PPO

## 2020-11-24 NOTE — Progress Notes (Signed)
Cardiology Office Note:   Date:  11/26/2020  NAME:  Virginia Burns    MRN: [id number] DOB:  1945/05/24   PCP:  Berkley Harvey, NP  Cardiologist:  None  Electrophysiologist:  None   Referring MD: Berkley Harvey, NP   Chief Complaint  Patient presents with   Follow-up   History of Present Illness:   Virginia Burns is a 75 y.o. female with a hx of COPD, atrial tachycardia, GERD who presents for follow-up. Had brief A tach on monitor. Started on diltiazem.  She reports she is doing well on 240 mg daily.  Heart palpitations can occur with heavy exertion but for the most part have resolved.  She is walking much better.  She is severely limited due to her severe COPD.  She has plans to participate in pulmonary rehab.  I think this would be a great idea.  She has no lower extremity edema.  Echocardiogram was normal.  Overall she is doing well.  She has had coronary calcification seen on CT scan.  She remains on aspirin 81 mg daily.  She has exceedingly high HDL cholesterol.  I suspect obstructive disease will never be a problem for her.  She also has thoracic aortic atherosclerosis.  Denies any symptoms concerning for angina.  Overall doing much better on diltiazem.  Problem List 1. COPD -2L O2 -moderate to severe 2. HTN 3. GERD 4. T chol 208, HDL 103, LDL 93, TG 60 5.  Former Tobacco abuse -20 pack years  6. Ectopic atrial tachycardia -Brief ectopic atrial tachycardia episodes detected (longest 9.8 seconds) -sx improved on diltiazem  7. Coronary calcifications on chest CT  Past Medical History: Past Medical History:  Diagnosis Date   Adjustment disorder with anxiety 08/17/2013   Allergic rhinitis    Anxiety state 08/17/2013   Overview:  IMPRESSION: hx of anxiety, lost a friend and has friend in ICU, using the ativan prn. had refilled 07/27/13   Aortic ejection murmur 07/14/2018   Benign neoplasm of ascending colon 12/07/2014   Benign neoplasm of descending colon 12/07/2014    Centrilobular emphysema ([address]) 12/07/2014   Chronic hyponatremia 10/05/2018   Last Assessment & Plan:  Formatting of this note is different from the original. Recent Labs    10/05/18 0007 10/06/18 0504 10/07/18 0733  NA 132* 131* 135   Baseline ~ 132   -stable   Chronic respiratory failure ([place]) 03/04/2015   Closed fracture of sacrum and coccyx ([place]) 10/03/2018   Last Assessment & Plan:  Formatting of this note might be different from the original. Patient presented with progressively worsening lower back pain and inability to ambulate  MR: Acute fracture of the right greater than left sacral ala and S2 segment. CT: Nondisplaced, vertical and transverse acute sacral insufficiency Fractures  -s/p perQ fixation on 5/20  --Management per ortho primary team: P   Complication of anesthesia    Versed- hard time working - "dinging for days" - Colonoscopy   Constipation 12/09/2012   Formatting of this note might be different from the original. IMPRESSION: increased constipation: trial of amitiza, to push fiber, can use mirlax prn.  eat 4 prunes a day. call prn. has had colonoscopy in past. is due next year for f/u   COPD (chronic obstructive pulmonary disease) ([place]) 02/10/2013   COPD (chronic obstructive pulmonary disease) with chronic bronchitis ([place]) 02/10/2013   Overnight pulse oximetry shows desaturations greater than 5 minutes at a time less than 88%.  Order signed  for nocturnal O2 at 2 L/m February 21, 2016  Overview:  Overnight pulse oximetry shows desaturations greater than 5 minutes at a time less than 88%.  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 therapy 12/07/2014   Diverticulosis of colon 10/12/2007   Qualifier: Diagnosis of  By: Nils Pyle CMA (AAMA), Mearl Latin     Essential  tremor 05/22/2013   Gastroparesis    GERD (gastroesophageal reflux disease)    GERD with esophagitis 10/12/2007   Qualifier: Diagnosis of  By: Nils Pyle CMA (AAMA), Mearl Latin    Overview:  Overview:  Qualifier: Diagnosis of  By: Nils Pyle CMA Deborra Medina), Mearl Latin  Overview:  Overview:  Qualifier: Diagnosis of  By: Nils Pyle CMA Deborra Medina), Leisha    History of tobacco abuse 09/18/2013   Humerus distal fracture 01/24/2018   Hx of colonic polyps    Hypertension    Hypertension, benign 07/19/2007   Qualifier: Diagnosis of  By: Garen Grams    Overview:  Overview:  STORY: The goal for blood pressure is less than 140/90.  If you are checking your blood pressure at home, please record it and bring it to your next office visit. 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  I have reviewed the recent sleep study results with the patient.  We discussed how sleep apnea can affect various health problems including risks for hypertension, cardiovascular disease, and diabetes.  We also discussed how sleep disruption can increase risks for accident, such as while driving.  Weight loss as a means of improving sl   OSA (obstructive sleep apnea) 09/18/2013   Osteoarthrosis 12/09/2012   Osteopenia of multiple sites 10/12/2007   Qualifier: Diagnosis of  By: Nils Pyle CMA (AAMA), Leisha     Osteoporosis    Palpitations 07/14/2018   Personal history of colonic polyps 10/07/2009   tubular adenoma   Tachycardia, paroxysmal ([place]) 07/04/2018   Urinary tract infection    Vitamin B12 deficiency    Vitamin D  deficiency 12/09/2012   Overview:  Overview:  IMPRESSION: Appropiate labs done today. We will send the results and adjust treatment as needed. Bone density discussed. There has  been an improvement in her bone density comparing the one in 2009 from the one 2011. From osteoporotic she went to osteopenic. She did take Actonel 3 years ago for several years and stopped it due to GI side effects. We will continue mantaining f    Past Surgical History: Past Surgical History:  Procedure Laterality Date   ABDOMINAL HYSTERECTOMY     ABDOMINAL HYSTERECTOMY     CATARACT EXTRACTION Right    CHOLECYSTECTOMY     COLONOSCOPY WITH PROPOFOL N/A 11/21/2014   Procedure: COLONOSCOPY WITH PROPOFOL;  Surgeon: Irene Shipper, MD;  Location: [place];  Service: Endoscopy;  Laterality: N/A;   FRACTURE SURGERY     GALLBLADDER SURGERY  1986   LIGAMENT REPAIR Left 04/18/2013   Procedure: Triangular Fibrocartilage complex open repair;  Surgeon: Jolyn Nap, MD;  Location: [place];  Service: Orthopedics;  Laterality: Left;   OOPHORECTOMY     OPEN REDUCTION INTERNAL FIXATION (ORIF) DISTAL RADIAL FRACTURE Left 04/18/2013   Procedure: LEFT OPEN REDUCTION INTERNAL FIXATION (ORIF) DISTAL RADIAL FRACTURE;  Surgeon: Jolyn Nap, MD;  Location: [place];  Service: Orthopedics;  Laterality: Left;   ORIF ELBOW FRACTURE Left 01/24/2018   Procedure: OPEN REDUCTION INTERNAL FIXATION (ORIF) DISTAL HUMERUS  ELBOW/OLECRANON OSTEOTOMY FRACTURE;  Surgeon: [name], MD;  Location: [place];  Service: Orthopedics;  Laterality: Left;    Current Medications: Current Meds  Medication Sig   AMBULATORY NON FORMULARY MEDICATION Take 10 mg by mouth 3 (three) times daily before meals. Medication Name: Domperidone   Ascorbic Acid (VITAMIN C) 1000 MG tablet Take 1,000 mg by mouth daily.   aspirin EC 81 MG tablet Take 81 mg by mouth every other day.   budesonide-formoterol (SYMBICORT) 160-4.5 MCG/ACT  inhaler Inhale 2 puffs into the lungs 2 (two) times daily.   calcium carbonate (OS-CAL - DOSED IN MG OF ELEMENTAL CALCIUM) 1250 (500 Ca) MG tablet Take 1 tablet by mouth. 600mg  bid   Cholecalciferol 125 MCG (5000 UT) TABS Take by mouth.   Cranberry 1000 MG CAPS Take by mouth.   denosumab (PROLIA) 60 MG/ML SOSY injection Inject into the skin.   diltiazem (CARDIZEM CD) 240 MG 24 hr capsule Take 1 capsule (240 mg total) by mouth daily.   Flaxseed, Linseed, (FLAX SEED OIL) 1000 MG CAPS Take by mouth.   gabapentin (NEURONTIN) 600 MG tablet Take 0.5 tablets by mouth 3 (three) times daily.   LORazepam (ATIVAN) 0.5 MG tablet Take 0.5 mg by mouth as needed for anxiety.    pantoprazole (PROTONIX) 40 MG tablet Take 1 tablet by mouth 2 (two) times daily.   Probiotic CAPS Take 1 capsule by mouth daily.   sucralfate (CARAFATE) 1 GM/10ML suspension TAKE 2 TEASPOONFUL (10 MLS) BY MOUTH  4 TIMES DAILY WITH MEALS AND AT BEDTIME (Patient taking differently: daily as needed. TAKE 2 TEASPOONFUL (10 MLS) BY MOUTH  4 TIMES DAILY WITH MEALS AND AT BEDTIME)   tiotropium (SPIRIVA) 18 MCG inhalation capsule Place 1 capsule (18 mcg total) into inhaler and inhale daily.   vitamin B-12 (CYANOCOBALAMIN) 1000 MCG tablet Take 2,000 mcg by mouth daily.     Allergies:    Clindamycin/lincomycin, Bactrim [sulfamethoxazole-trimethoprim], and Elemental sulfur   Social History: Social History   Socioeconomic History   Marital status: Single    Spouse name: Not on file   Number of children: 1   Years of education: Not on file   Highest education level: Not on file  Occupational History   Occupation: Realtor    Employer: [employer]  Tobacco Use   Smoking status: Former    Packs/day: 1.00    Years: 25.00    Pack years: 25.00    Types: Cigarettes    Quit date: 05/18/2009    Years since quitting: 11.5   Smokeless tobacco: Never  Vaping Use   Vaping Use: Never used  Substance and Sexual Activity   Alcohol use: Yes     Alcohol/week: 2.0 standard drinks    Types: 2 Glasses of wine per week    Comment: 2 glasses of wine each night   Drug use: No   Sexual activity: Not on file  Other Topics Concern   Not on file  Social History Narrative   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    She lives with son.  She is divorced.         Social Determinants of Health   Financial Resource Strain: Not on file  Food Insecurity: Not on file  Transportation Needs: Not on file  Physical Activity: Not on file  Stress: Not on file  Social Connections: Not on file     Family History: The patient's family history includes COPD in her mother; Colitis in her son; Colon cancer in her cousin; Diabetes in her brother; Diabetes type II in her son; Emphysema in her mother; Other in her father; Tremor in her maternal aunt and mother.  ROS:   All other ROS reviewed and negative. Pertinent positives noted in the HPI.     EKGs/Labs/Other Studies Reviewed:   The following studies were personally reviewed by me today:  TTE 07/18/2018  1. The left ventricle has normal systolic function with an ejection  fraction of 60-65%. The cavity size was normal. Left ventricular diastolic  Doppler parameters are consistent with impaired relaxation.   2. The right ventricle has normal systolic function. The cavity was  normal. There is no increase in right ventricular wall thickness.   3. The mitral valve is normal in structure.   4. The tricuspid valve is normal in structure.   5. The aortic valve is normal in structure.   6. The pulmonic valve was normal in structure.   7. There is dilatation of the ascending aorta measuring 36 mm.   [place] 07/11/2020 Impression: 1. Brief ectopic atrial tachycardia episodes detected (7 episodes in 7 days; longest 9.8 seconds). 2. Symptoms occurred with normal sinus rhythm. 3. Rare ectopy.  Recent Labs: 06/12/2020: BUN 11; Creatinine, Ser 0.55; Hemoglobin 13.0; Platelets 379.0;  Potassium 4.1; Pro B Natriuretic peptide (BNP) 37.0; Sodium 129   Recent Lipid Panel No results found for: CHOL, TRIG, HDL, CHOLHDL, VLDL, LDLCALC, LDLDIRECT  Physical Exam:   VS:  BP 128/72   Pulse 60   Ht 5' 5.5" (1.664 m)   Wt 167 lb (75.8 kg)   SpO2 91%   BMI 27.37 kg/m    Wt Readings from Last 3 Encounters:  11/26/20 167 lb (75.8 kg)  09/02/20 171 lb 4.8 oz (77.7 kg)  08/14/20 170 lb 3.2 oz (77.2 kg)    General: Well nourished, well developed, in no acute distress Head: Atraumatic, normal size  Eyes: PEERLA, EOMI  Neck: Supple, no JVD Endocrine: No thryomegaly Cardiac: Normal S1, S2; RRR; no murmurs, rubs, or gallops Lungs: Clear to auscultation bilaterally, no wheezing, rhonchi or rales  Abd: Soft, nontender, no hepatomegaly  Ext: No edema, pulses 2+ Musculoskeletal: No deformities, BUE and BLE strength normal and equal Skin: Warm and dry, no rashes   Neuro: Alert and oriented to person, place, time, and situation, CNII-XII grossly intact, no focal deficits  Psych: Normal mood and affect   ASSESSMENT:   Virginia Burns is a 75 y.o. female who presents for the following: 1. Ectopic atrial tachycardia ([place])   2. Tachycardia   3. SOB (shortness of breath) on exertion    PLAN:   1. Ectopic atrial tachycardia ([place]) 2. Tachycardia -Brief atrial tachycardia episodes noted on monitor.  Symptoms have improved with diltiazem 240 mg extended release.  We will continue this for now.  Echocardiogram normal.  No symptoms concerning for angina.  She describes  no chest pain.  She will see [place] yearly.  3. SOB (shortness of breath) on exertion -Related to moderate to severe COPD.  Disposition: Return in about 6 months (around 05/29/2021).  Medication Adjustments/Labs and Tests Ordered: Current medicines are reviewed at length with the patient today.  Concerns regarding medicines are outlined above.  No orders of the defined types were placed in this encounter.  No orders of  the defined types were placed in this encounter.   Patient Instructions  Medication Instructions:  The current medical regimen is effective;  continue present plan and medications.  *If you need a refill on your cardiac medications before your next appointment, please call your pharmacy*   Follow-Up: At [facility], you and your health needs are our priority.  As part of our continuing mission to provide you with exceptional heart care, we have created designated Provider Care Teams.  These Care Teams include your primary Cardiologist (physician) and Advanced Practice Providers (APPs -  Physician Assistants and Nurse Practitioners) who all work together to provide you with the care you need, when you need it.  We recommend signing up for the patient portal called "MyChart".  Sign up information is provided on this After Visit Summary.  MyChart is used to connect with patients for Virtual Visits (Telemedicine).  Patients are able to view lab/test results, encounter notes, upcoming appointments, etc.  Non-urgent messages can be sent to your provider as well.   To learn more about what you can do with MyChart, go to [email or web address].    Your next appointment:   6 month(s)  The format for your next appointment:   In Person  Provider:   Eleonore Chiquito, MD    Time Spent with Patient: I have spent a total of 25 minutes with patient reviewing hospital notes, telemetry, EKGs, labs and examining the patient as well as establishing an assessment and plan that was discussed with the patient.  > 50% of time was spent in direct patient care.  Signed, Addison Naegeli. Audie Box, MD, [place]  [address], [place], [place] [address] [phone]  11/26/2020 2:54 PM

## 2020-11-26 ENCOUNTER — Ambulatory Visit: Payer: PPO | Admitting: Cardiovascular Disease

## 2020-11-26 ENCOUNTER — Other Ambulatory Visit: Payer: Self-pay

## 2020-11-26 ENCOUNTER — Encounter: Payer: Self-pay | Admitting: Cardiovascular Disease

## 2020-11-26 VITALS — BP 128/72 | HR 60 | Ht 65.5 in | Wt 167.0 lb

## 2020-11-26 DIAGNOSIS — R0602 Shortness of breath: Secondary | ICD-10-CM

## 2020-11-26 DIAGNOSIS — R000 Tachycardia, unspecified: Secondary | ICD-10-CM

## 2020-11-26 DIAGNOSIS — I471 Supraventricular tachycardia: Secondary | ICD-10-CM | POA: Diagnosis not present

## 2020-11-26 NOTE — Patient Instructions (Signed)
Medication Instructions:  The current medical regimen is effective;  continue present plan and medications.  *If you need a refill on your cardiac medications before your next appointment, please call your pharmacy*   Follow-Up: At [facility], you and your health needs are our priority.  As part of our continuing mission to provide you with exceptional heart care, we have created designated Provider Care Teams.  These Care Teams include your primary Cardiologist (physician) and Advanced Practice Providers (APPs -  Physician Assistants and Nurse Practitioners) who all work together to provide you with the care you need, when you need it.  We recommend signing up for the patient portal called "MyChart".  Sign up information is provided on this After Visit Summary.  MyChart is used to connect with patients for Virtual Visits (Telemedicine).  Patients are able to view lab/test results, encounter notes, upcoming appointments, etc.  Non-urgent messages can be sent to your provider as well.   To learn more about what you can do with MyChart, go to [email or web address].    Your next appointment:   6 month(s)  The format for your next appointment:   In Person  Provider:   Chaplin O'Neal, MD      

## 2020-12-01 DIAGNOSIS — J449 Chronic obstructive pulmonary disease, unspecified: Secondary | ICD-10-CM | POA: Diagnosis not present

## 2020-12-01 DIAGNOSIS — J432 Centrilobular emphysema: Secondary | ICD-10-CM | POA: Diagnosis not present

## 2020-12-04 DIAGNOSIS — M79672 Pain in left foot: Secondary | ICD-10-CM | POA: Diagnosis not present

## 2020-12-04 DIAGNOSIS — M79671 Pain in right foot: Secondary | ICD-10-CM | POA: Diagnosis not present

## 2020-12-11 DIAGNOSIS — J432 Centrilobular emphysema: Secondary | ICD-10-CM | POA: Diagnosis not present

## 2020-12-11 DIAGNOSIS — J449 Chronic obstructive pulmonary disease, unspecified: Secondary | ICD-10-CM | POA: Diagnosis not present

## 2020-12-30 ENCOUNTER — Telehealth (HOSPITAL_COMMUNITY): Payer: Self-pay | Admitting: Nurse Practitioner

## 2020-12-30 DIAGNOSIS — J449 Chronic obstructive pulmonary disease, unspecified: Secondary | ICD-10-CM

## 2020-12-30 NOTE — Telephone Encounter (Signed)
Returned pt phone call, adv pt she would have to contact her pulmonary dr and have another PR referral sent over. Pt stated that she understood and will contact Dr. Halford Chessman office.

## 2020-12-30 NOTE — Telephone Encounter (Signed)
Dr. Halford Chessman,  Patient sent this message for you today.   Dr. Halford Chessman, I went to rehab one time an then had a severe ankle sprain. Wore a boot 7 weeks.  Had eye surgery also. I am ready to go back but they said you need to send them a new request Thank you Virginia Burns

## 2020-12-31 ENCOUNTER — Encounter (HOSPITAL_COMMUNITY): Payer: Self-pay | Admitting: *Deleted

## 2020-12-31 DIAGNOSIS — H26491 Other secondary cataract, right eye: Secondary | ICD-10-CM | POA: Diagnosis not present

## 2020-12-31 NOTE — Telephone Encounter (Signed)
I have placed order to refer back to pulmonary rehab.

## 2020-12-31 NOTE — Progress Notes (Signed)
Received referral from Dr. Halford Chessman for this pt to participate in pulmonary rehab with the the diagnosis of COPD Stage 3.  Pt completed PFT on 06/2020 - FEV1/FVC 53 and post predicted FEV1 40.Pamala Hurry initially referred back in April attended Orientation/.Walk test but unfortunately later that week had a severe sprained ankle and was unable to proceed with exercise. Pt contacted pulmonary rehab and feels able to attend. Clinical review of pt follow up appt on 3/24 Pulmonary office note.  Pt with Covid Risk Score - 5 . Pt appropriate for scheduling for Pulmonary rehab.  Will forward to support staff for scheduling and verification of insurance eligibility/benefits with pt consent. Cherre Huger, BSN Cardiac and Training and development officer

## 2021-01-01 DIAGNOSIS — J449 Chronic obstructive pulmonary disease, unspecified: Secondary | ICD-10-CM | POA: Diagnosis not present

## 2021-01-01 DIAGNOSIS — J432 Centrilobular emphysema: Secondary | ICD-10-CM | POA: Diagnosis not present

## 2021-01-08 ENCOUNTER — Telehealth (HOSPITAL_COMMUNITY): Payer: Self-pay

## 2021-01-08 NOTE — Telephone Encounter (Signed)
Pt insurance is active and benefits verified through [employer]. Co-pay $15, DED 0/0 met, out of pocket $3,450/$513.96 met, co-insurance 0%. no pre-authorization required. [employer] 01/08/2021_0 Linward Headland, REF# [id number]  Will contact patient to see if she is interested in the Cardiac Rehab Program. If interested, patient will need to complete follow up appt. Once completed, patient will be contacted for scheduling upon review by the RN Navigator.

## 2021-01-11 DIAGNOSIS — J432 Centrilobular emphysema: Secondary | ICD-10-CM | POA: Diagnosis not present

## 2021-01-11 DIAGNOSIS — J449 Chronic obstructive pulmonary disease, unspecified: Secondary | ICD-10-CM | POA: Diagnosis not present

## 2021-01-15 ENCOUNTER — Ambulatory Visit (HOSPITAL_COMMUNITY): Payer: PPO

## 2021-02-01 DIAGNOSIS — J432 Centrilobular emphysema: Secondary | ICD-10-CM | POA: Diagnosis not present

## 2021-02-01 DIAGNOSIS — J449 Chronic obstructive pulmonary disease, unspecified: Secondary | ICD-10-CM | POA: Diagnosis not present

## 2021-02-10 ENCOUNTER — Telehealth (HOSPITAL_COMMUNITY): Payer: Self-pay | Admitting: *Deleted

## 2021-02-11 DIAGNOSIS — J449 Chronic obstructive pulmonary disease, unspecified: Secondary | ICD-10-CM | POA: Diagnosis not present

## 2021-02-11 DIAGNOSIS — J432 Centrilobular emphysema: Secondary | ICD-10-CM | POA: Diagnosis not present

## 2021-02-12 ENCOUNTER — Encounter (HOSPITAL_COMMUNITY)
Admission: RE | Admit: 2021-02-12 | Discharge: 2021-02-12 | Disposition: A | Discharge status: Home / Self Care | Payer: PPO | Source: Ambulatory Visit | Attending: Pulmonary Disease | Admitting: Pulmonary Disease

## 2021-02-12 ENCOUNTER — Other Ambulatory Visit: Payer: Self-pay

## 2021-02-12 ENCOUNTER — Ambulatory Visit (HOSPITAL_COMMUNITY): Payer: PPO

## 2021-02-12 VITALS — BP 146/60 | HR 85 | Ht 65.0 in | Wt 171.1 lb

## 2021-02-12 DIAGNOSIS — J449 Chronic obstructive pulmonary disease, unspecified: Secondary | ICD-10-CM | POA: Insufficient documentation

## 2021-02-12 NOTE — Progress Notes (Signed)
Virginia Burns 75 y.o. female  Initial Psychosocial Assessment  Pt psychosocial assessment reveals pt lives alone. Has a very good support system which includes her son and several close friends.Due to weakness she does not drive but has friends that takes her to appointments She will participate in the hospital transportation service when her friends are not available .Pt is currently working part time as a Patent attorney. Pt hobbies include reading, painting and talking on the telephone. Pt reports her stress level is low. Pt reports no areas of stress in her life. Pt not does exhibits signs of depression.  Pt shows good  coping skills with positive outlook .Will continue to monitor and evaluate  for psychosocial concerns and barriers.  Goal(s): For Virginia Burns to become stronger so that she will be able to drive herself again to appointments. Help patient work toward returning to meaningful activities that improve patient's QOL and are attainable with patient's lung disease For Virginia Burns to have no barriers or concerns will participating in pulmonary rehab.  02/12/2021 12:23 PM

## 2021-02-12 NOTE — Progress Notes (Signed)
Pulmonary Individual Treatment Plan  Patient Details  Name: LASONIA CASINO MRN: [id number] Date of Birth: 1946/03/24 Referring Provider:   April Manson Pulmonary Rehab Walk Test from 02/12/2021 in [place]  Referring Provider Dr. Halford Chessman       Initial Encounter Date:  Flowsheet Row Pulmonary Rehab Walk Test from 02/12/2021 in [place]  Date 02/12/21       Visit Diagnosis: Stage 3 severe COPD by GOLD classification ([place])  Patient's Home Medications on Admission:   Current Outpatient Medications:    AMBULATORY NON FORMULARY MEDICATION, Take 10 mg by mouth 3 (three) times daily before meals. Medication Name: Domperidone, Disp: 270 tablet, Rfl: 3   Ascorbic Acid (VITAMIN C) 1000 MG tablet, Take 1,000 mg by mouth daily., Disp: , Rfl:    aspirin EC 81 MG tablet, Take 81 mg by mouth every other day., Disp: , Rfl:    budesonide-formoterol (SYMBICORT) 160-4.5 MCG/ACT inhaler, Inhale 2 puffs into the lungs 2 (two) times daily., Disp: 1 Inhaler, Rfl: 5   calcium carbonate (OS-CAL - DOSED IN MG OF ELEMENTAL CALCIUM) 1250 (500 Ca) MG tablet, Take 1 tablet by mouth. 600mg  bid, Disp: , Rfl:    Cholecalciferol 125 MCG (5000 UT) TABS, Take by mouth., Disp: , Rfl:    Cranberry 1000 MG CAPS, Take by mouth., Disp: , Rfl:    denosumab (PROLIA) 60 MG/ML SOSY injection, Inject into the skin., Disp: , Rfl:    Flaxseed, Linseed, (FLAX SEED OIL) 1000 MG CAPS, Take by mouth., Disp: , Rfl:    gabapentin (NEURONTIN) 600 MG tablet, Take 0.5 tablets by mouth 3 (three) times daily., Disp: , Rfl:    LORazepam (ATIVAN) 0.5 MG tablet, Take 0.5 mg by mouth as needed for anxiety. , Disp: , Rfl:    pantoprazole (PROTONIX) 40 MG tablet, Take 1 tablet by mouth 2 (two) times daily., Disp: , Rfl:    Probiotic CAPS, Take 1 capsule by mouth daily., Disp: , Rfl:    sucralfate (CARAFATE) 1 GM/10ML suspension, TAKE 2 TEASPOONFUL (10 MLS) BY MOUTH  4 TIMES DAILY  WITH MEALS AND AT BEDTIME (Patient taking differently: daily as needed. TAKE 2 TEASPOONFUL (10 MLS) BY MOUTH  4 TIMES DAILY WITH MEALS AND AT BEDTIME), Disp: 960 mL, Rfl: 1   tiotropium (SPIRIVA) 18 MCG inhalation capsule, Place 1 capsule (18 mcg total) into inhaler and inhale daily., Disp: 90 capsule, Rfl: 3   vitamin B-12 (CYANOCOBALAMIN) 1000 MCG tablet, Take 2,000 mcg by mouth daily., Disp: , Rfl:    diltiazem (CARDIZEM CD) 240 MG 24 hr capsule, Take 1 capsule (240 mg total) by mouth daily., Disp: 90 capsule, Rfl: 3  Past Medical History: Past Medical History:  Diagnosis Date   Adjustment disorder with anxiety 08/17/2013   Allergic rhinitis    Anxiety state 08/17/2013   Overview:  IMPRESSION: hx of anxiety, lost a friend and has friend in ICU, using the ativan prn. had refilled 07/27/13   Aortic ejection murmur 07/14/2018   Benign neoplasm of ascending colon 12/07/2014   Benign neoplasm of descending colon 12/07/2014   Centrilobular emphysema ([place]) 12/07/2014   Chronic hyponatremia 10/05/2018   Last Assessment & Plan:  Formatting of this note is different from the original. Recent Labs    10/05/18 0007 10/06/18 0504 10/07/18 0733  NA 132* 131* 135   Baseline ~ 132   -stable   Chronic respiratory failure (HCC) 03/04/2015   Closed fracture of sacrum  and coccyx ([place]) 10/03/2018   Last Assessment & Plan:  Formatting of this note might be different from the original. Patient presented with progressively worsening lower back pain and inability to ambulate  MR: Acute fracture of the right greater than left sacral ala and S2 segment. CT: Nondisplaced, vertical and transverse acute sacral insufficiency Fractures  -s/p perQ fixation on 5/20  --Management per ortho primary team: P   Complication of anesthesia    Versed- hard time working - "dinging for days" - Colonoscopy   Constipation 12/09/2012   Formatting of this note might be different from the original. IMPRESSION: increased constipation: trial of  amitiza, to push fiber, can use mirlax prn.  eat 4 prunes a day. call prn. has had colonoscopy in past. is due next year for f/u   COPD (chronic obstructive pulmonary disease) ([place]) 02/10/2013   COPD (chronic obstructive pulmonary disease) with chronic bronchitis ([place]) 02/10/2013   Overnight pulse oximetry shows desaturations greater than 5 minutes at a time less than 88%.  Order signed for nocturnal O2 at 2 L/m February 21, 2016  Overview:  Overnight pulse oximetry shows desaturations greater than 5 minutes at a time less than 88%.  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 therapy 12/07/2014   Diverticulosis of colon 10/12/2007   Qualifier: Diagnosis of  By: Nils Pyle CMA (AAMA), Mearl Latin     Essential tremor 05/22/2013   Gastroparesis    GERD (gastroesophageal reflux disease)    GERD with esophagitis 10/12/2007   Qualifier: Diagnosis of  By: Nils Pyle CMA (AAMA), Mearl Latin    Overview:  Overview:  Qualifier: Diagnosis of  By: Nils Pyle CMA Deborra Medina), Mearl Latin  Overview:  Overview:  Qualifier: Diagnosis of  By: Nils Pyle CMA Deborra Medina), Leisha    History of tobacco abuse 09/18/2013   Humerus distal fracture 01/24/2018   Hx of colonic polyps    Hypertension    Hypertension, benign 07/19/2007   Qualifier: Diagnosis of  By: Garen Grams    Overview:  Overview:  STORY: The goal for blood pressure is less than 140/90.  If you are checking your blood pressure at home, please record it and bring it to your next office visit. 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  I have reviewed the recent sleep study results with the patient.  We discussed how sleep apnea can affect various health problems including risks for hypertension, cardiovascular disease, and diabetes.  We also discussed how sleep disruption can increase risks for accident, such as while driving.  Weight loss as a means of improving sl   OSA (obstructive sleep apnea) 09/18/2013   Osteoarthrosis 12/09/2012   Osteopenia of multiple sites 10/12/2007   Qualifier: Diagnosis of  By: Nils Pyle CMA (AAMA), Leisha     Osteoporosis    Palpitations 07/14/2018   Personal history of colonic  polyps 10/07/2009   tubular adenoma   Tachycardia, paroxysmal ([place]) 07/04/2018   Urinary tract infection    Vitamin B12 deficiency    Vitamin D deficiency 12/09/2012   Overview:  Overview:  IMPRESSION: Appropiate labs done today. We will send the results and adjust treatment as needed. Bone density discussed. There has  been an improvement in her bone density comparing the one in 2009 from the one 2011. From osteoporotic she went to osteopenic. She did take Actonel 3 years ago for several years and stopped it due to GI side effects. We will continue mantaining f    Tobacco Use: Social History   Tobacco Use  Smoking Status Former   Packs/day: 1.00   Years: 25.00   Pack years: 25.00   Types: Cigarettes   Quit date: 05/18/2009   Years since quitting: 11.7  Smokeless Tobacco Never    Labs: Recent Review Flowsheet Data     Labs for ITP Cardiac and Pulmonary Rehab Latest Ref Rng & Units 12/27/2012 11/21/2014   TCO2 0 - 100 mmol/L 27 26       Capillary Blood Glucose: No results found for: GLUCAP   Pulmonary Assessment Scores:  Pulmonary Assessment Scores     Row Name 09/02/20 1240 02/12/21 1103       ADL  UCSD   ADL Phase Entry Entry    SOB Score total -- 81         CAT Score   CAT Score -- 22         mMRC Score   mMRC Score 3 3            UCSD: Self-administered rating of dyspnea associated with activities of daily living (ADLs) 6-point scale (0 = "not at all" to 5 = "maximal or unable to do because of breathlessness")  Scoring Scores range from 0 to 120.  Minimally important difference is 5 units  CAT: CAT can identify the health impairment of COPD patients and is better correlated with disease progression.  CAT has a scoring range of zero to 40. The CAT score is classified into four groups of low (less than 10), medium (10 - 20), high (21-30) and very high (31-40) based on the impact level of disease on health status. A CAT score over 10 suggests significant symptoms.  A worsening CAT score could be explained by an exacerbation, poor medication adherence, poor inhaler technique, or progression of COPD or comorbid conditions.  CAT MCID is 2 points  mMRC: mMRC (Modified Medical Research Council) Dyspnea Scale is used to assess the degree of baseline functional disability in patients of respiratory disease due to dyspnea. No minimal important difference is established. A decrease in score of 1 point or greater is considered a positive change.   Pulmonary Function Assessment:  Pulmonary Function Assessment - 02/12/21 1102       Breath   Bilateral Breath Sounds Clear;Decreased    Shortness of Breath Fear of Shortness of Breath;Yes             Exercise Target Goals: Exercise Program Goal: Individual exercise prescription set using results from initial 6 min walk test and THRR while considering  patient's activity barriers and safety.   Exercise Prescription Goal: Initial exercise prescription builds to 30-45 minutes a day of aerobic activity, 2-3 days per week.  Home exercise guidelines will be given to patient during program as part of exercise prescription that the  participant will acknowledge.  Activity Barriers & Risk Stratification:  Activity Barriers & Cardiac Risk Stratification - 02/12/21 1057       Activity Barriers & Cardiac Risk Stratification   Activity Barriers Arthritis;Back Problems;Joint Problems;Deconditioning;Muscular Weakness;Shortness of Breath             6 Minute Walk:  6 Minute Walk     Row Name 09/02/20 1228 02/12/21 1140       6 Minute Walk   Phase Initial Initial    Distance 581 feet 705 feet    Walk Time 6 minutes 6 minutes    # of Rest Breaks 0 0    MPH 1.1 1.34    METS 1.96 1.77    RPE 13 13    Perceived Dyspnea  2 1    VO2 Peak 6.86 6.2    Symptoms Yes (comment) Yes (comment)    Comments Pt stated she was very fatigued, had to take a couple of standing rest breaks Pt stated she was fatigued and would feel sore tomorrow    Resting HR 85 bpm 73 bpm    Resting BP 140/70 146/60    Resting Oxygen Saturation  99 % 99 %    Exercise Oxygen Saturation  during 6 min walk 96 % 97 %    Max Ex. HR 122 bpm 111 bpm    Max Ex. BP 170/78 144/70    2 Minute Post BP 154/70 140/60         Interval HR   1 Minute HR 110 89    2 Minute HR 117 107    3 Minute HR 120 100    4 Minute HR 120 107    5 Minute HR 122 108    6 Minute HR 122 111    2 Minute Post HR 97 92    Interval Heart Rate? Yes Yes         Interval Oxygen   Interval Oxygen? Yes Yes    Baseline Oxygen Saturation % 99 % 99 %  2L    1 Minute Oxygen Saturation % 98 % 100 %    1 Minute Liters of Oxygen 2 L 2 L    2 Minute Oxygen Saturation % 96 % 97 %    2 Minute Liters of Oxygen 2 L 2 L    3 Minute Oxygen Saturation % 96 % 97 %    3 Minute Liters of Oxygen 2 L 2 L    4 Minute Oxygen Saturation % 96 % 97 %    4 Minute Liters of Oxygen 2 L 2 L    5 Minute Oxygen Saturation % 97 % 98 %    5 Minute Liters of Oxygen 2 L 2 L    6 Minute Oxygen Saturation % 97 % 97 %    6 Minute Liters of Oxygen 2 L 2 L    2 Minute Post Oxygen Saturation % 98 % 97 %     2 Minute Post Liters of Oxygen 2 L 2 L             Oxygen Initial Assessment:  Oxygen Initial Assessment - 02/12/21 1059       Home Oxygen   Home Oxygen Device Home Concentrator;Portable Concentrator    Sleep Oxygen Prescription Continuous    Liters per minute 2    Home Exercise Oxygen Prescription Pulsed    Liters per minute 2    Home Resting Oxygen Prescription None    Compliance with Home Oxygen Use Yes  Initial 6 min Walk   Oxygen Used Continuous    Liters per minute 2      Program Oxygen Prescription   Program Oxygen Prescription Continuous    Liters per minute 2      Intervention   Short Term Goals To learn and exhibit compliance with exercise, home and travel O2 prescription;To learn and understand importance of monitoring SPO2 with pulse oximeter and demonstrate accurate use of the pulse oximeter.;To learn and understand importance of maintaining oxygen saturations>88%;To learn and demonstrate proper pursed lip breathing techniques or other breathing techniques. ;To learn and demonstrate proper use of respiratory medications    Long  Term Goals Exhibits compliance with exercise, home  and travel O2 prescription;Verbalizes importance of monitoring SPO2 with pulse oximeter and return demonstration;Maintenance of O2 saturations>88%;Exhibits proper breathing techniques, such as pursed lip breathing or other method taught during program session;Compliance with respiratory medication;Demonstrates proper use of MDI's             Oxygen Re-Evaluation:  Oxygen Re-Evaluation     Row Name 09/09/20 1641             Program Oxygen Prescription   Program Oxygen Prescription Continuous       Liters per minute 2               Home Oxygen   Home Oxygen Device Portable Concentrator;Home Concentrator       Sleep Oxygen Prescription Continuous       Liters per minute 2       Home Exercise Oxygen Prescription Pulsed       Liters per minute 2       Home Resting Oxygen  Prescription None       Compliance with Home Oxygen Use Yes               Goals/Expected Outcomes   Short Term Goals To learn and exhibit compliance with exercise, home and travel O2 prescription;To learn and understand importance of monitoring SPO2 with pulse oximeter and demonstrate accurate use of the pulse oximeter.;To learn and understand importance of maintaining oxygen saturations>88%;To learn and demonstrate proper pursed lip breathing techniques or other breathing techniques. ;To learn and demonstrate proper use of respiratory medications       Long  Term Goals Exhibits compliance with exercise, home  and travel O2 prescription;Verbalizes importance of monitoring SPO2 with pulse oximeter and return demonstration;Maintenance of O2 saturations>88%;Exhibits proper breathing techniques, such as pursed lip breathing or other method taught during program session;Compliance with respiratory medication;Demonstrates proper use of MDI's       Goals/Expected Outcomes Compliance with monitoring oxygen saturation and maintaining above 88%. Also for pt to understand the importance of pursed lip breathing and when to use it.                Oxygen Discharge (Final Oxygen Re-Evaluation):  Oxygen Re-Evaluation - 09/09/20 1641       Program Oxygen Prescription   Program Oxygen Prescription Continuous    Liters per minute 2      Home Oxygen   Home Oxygen Device Portable Concentrator;Home Concentrator    Sleep Oxygen Prescription Continuous    Liters per minute 2    Home Exercise Oxygen Prescription Pulsed    Liters per minute 2    Home Resting Oxygen Prescription None    Compliance with Home Oxygen Use Yes      Goals/Expected Outcomes   Short Term Goals To learn and exhibit compliance with exercise,  home and travel O2 prescription;To learn and understand importance of monitoring SPO2 with pulse oximeter and demonstrate accurate use of the pulse oximeter.;To learn and understand importance of  maintaining oxygen saturations>88%;To learn and demonstrate proper pursed lip breathing techniques or other breathing techniques. ;To learn and demonstrate proper use of respiratory medications    Long  Term Goals Exhibits compliance with exercise, home  and travel O2 prescription;Verbalizes importance of monitoring SPO2 with pulse oximeter and return demonstration;Maintenance of O2 saturations>88%;Exhibits proper breathing techniques, such as pursed lip breathing or other method taught during program session;Compliance with respiratory medication;Demonstrates proper use of MDI's    Goals/Expected Outcomes Compliance with monitoring oxygen saturation and maintaining above 88%. Also for pt to understand the importance of pursed lip breathing and when to use it.             Initial Exercise Prescription:  Initial Exercise Prescription - 02/12/21 1100       Date of Initial Exercise RX and Referring Provider   Date 02/12/21    Referring Provider Dr. Halford Chessman    Expected Discharge Date 04/17/21      Oxygen   Oxygen Continuous    Liters 2      NuStep   Level 1    SPM 75    Minutes 30      Prescription Details   Frequency (times per week) 2    Duration Progress to 30 minutes of continuous aerobic without signs/symptoms of physical distress      Intensity   THRR 40-80% of Max Heartrate 58-117    Ratings of Perceived Exertion 11-13    Perceived Dyspnea 0-4      Progression   Progression Continue to progress workloads to maintain intensity without signs/symptoms of physical distress.      Resistance Training   Training Prescription Yes    Weight Red bands    Reps 10-15             Perform Capillary Blood Glucose checks as needed.  Exercise Prescription Changes:   Exercise Comments:   Exercise Goals and Review:   Exercise Goals     Row Name 09/02/20 1225 02/12/21 1147           Exercise Goals   Increase Physical Activity Yes Yes      Intervention Provide advice,  education, support and counseling about physical activity/exercise needs.;Develop an individualized exercise prescription for aerobic and resistive training based on initial evaluation findings, risk stratification, comorbidities and participant's personal goals. Provide advice, education, support and counseling about physical activity/exercise needs.;Develop an individualized exercise prescription for aerobic and resistive training based on initial evaluation findings, risk stratification, comorbidities and participant's personal goals.      Expected Outcomes Short Term: Attend rehab on a regular basis to increase amount of physical activity.;Long Term: Add in home exercise to make exercise part of routine and to increase amount of physical activity.;Long Term: Exercising regularly at least 3-5 days a week. Short Term: Attend rehab on a regular basis to increase amount of physical activity.;Long Term: Add in home exercise to make exercise part of routine and to increase amount of physical activity.;Long Term: Exercising regularly at least 3-5 days a week.      Increase Strength and Stamina Yes Yes      Intervention Provide advice, education, support and counseling about physical activity/exercise needs.;Develop an individualized exercise prescription for aerobic and resistive training based on initial evaluation findings, risk stratification, comorbidities and participant's personal goals. Provide advice,  education, support and counseling about physical activity/exercise needs.;Develop an individualized exercise prescription for aerobic and resistive training based on initial evaluation findings, risk stratification, comorbidities and participant's personal goals.      Expected Outcomes Short Term: Increase workloads from initial exercise prescription for resistance, speed, and METs.;Short Term: Perform resistance training exercises routinely during rehab and add in resistance training at home;Long Term:  Improve cardiorespiratory fitness, muscular endurance and strength as measured by increased METs and functional capacity (6MWT) Short Term: Increase workloads from initial exercise prescription for resistance, speed, and METs.;Short Term: Perform resistance training exercises routinely during rehab and add in resistance training at home;Long Term: Improve cardiorespiratory fitness, muscular endurance and strength as measured by increased METs and functional capacity (6MWT)      Able to understand and use rate of perceived exertion (RPE) scale Yes Yes      Intervention Provide education and explanation on how to use RPE scale Provide education and explanation on how to use RPE scale      Expected Outcomes Short Term: Able to use RPE daily in rehab to express subjective intensity level;Long Term:  Able to use RPE to guide intensity level when exercising independently Short Term: Able to use RPE daily in rehab to express subjective intensity level;Long Term:  Able to use RPE to guide intensity level when exercising independently      Able to understand and use Dyspnea scale Yes Yes      Intervention Provide education and explanation on how to use Dyspnea scale Provide education and explanation on how to use Dyspnea scale      Expected Outcomes Short Term: Able to use Dyspnea scale daily in rehab to express subjective sense of shortness of breath during exertion;Long Term: Able to use Dyspnea scale to guide intensity level when exercising independently Short Term: Able to use Dyspnea scale daily in rehab to express subjective sense of shortness of breath during exertion;Long Term: Able to use Dyspnea scale to guide intensity level when exercising independently      Knowledge and understanding of Target Heart Rate Range (THRR) Yes Yes      Intervention Provide education and explanation of THRR including how the numbers were predicted and where they are located for reference Provide education and explanation of  THRR including how the numbers were predicted and where they are located for reference      Expected Outcomes Short Term: Able to state/look up THRR;Long Term: Able to use THRR to govern intensity when exercising independently;Short Term: Able to use daily as guideline for intensity in rehab Short Term: Able to state/look up THRR;Long Term: Able to use THRR to govern intensity when exercising independently;Short Term: Able to use daily as guideline for intensity in rehab      Understanding of Exercise Prescription Yes Yes      Intervention Provide education, explanation, and written materials on patient's individual exercise prescription Provide education, explanation, and written materials on patient's individual exercise prescription      Expected Outcomes Short Term: Able to explain program exercise prescription;Long Term: Able to explain home exercise prescription to exercise independently Short Term: Able to explain program exercise prescription;Long Term: Able to explain home exercise prescription to exercise independently               Exercise Goals Re-Evaluation :  Exercise Goals Re-Evaluation     Row Name 09/09/20 1639 02/12/21 1148           Exercise Goal Re-Evaluation  Exercise Goals Review Increase Physical Activity;Increase Strength and Stamina;Able to understand and use rate of perceived exertion (RPE) scale;Able to understand and use Dyspnea scale;Knowledge and understanding of Target Heart Rate Range (THRR);Understanding of Exercise Prescription Increase Physical Activity;Increase Strength and Stamina;Able to understand and use rate of perceived exertion (RPE) scale;Able to understand and use Dyspnea scale;Knowledge and understanding of Target Heart Rate Range (THRR);Understanding of Exercise Prescription      Comments Pt is scheduled to start exercise this week. We will monitor and progress as she is able. Pt is scheduled to start exercise next week. We will monitor and  progress as she is able.      Expected Outcomes Through exercise at rehab and home, the patient will decrease shortness of breath with daily activities and feel confident in carrying out an exercise regimn at home. Through exercise at rehab and home, the patient will decrease shortness of breath with daily activities and feel confident in carrying out an exercise regimn at home.               Discharge Exercise Prescription (Final Exercise Prescription Changes):   Nutrition:  Target Goals: Understanding of nutrition guidelines, daily intake of sodium 1500mg , cholesterol 200mg , calories 30% from fat and 7% or less from saturated fats, daily to have 5 or more servings of fruits and vegetables.  Biometrics:  Pre Biometrics - 02/12/21 1059       Pre Biometrics   Height 5\' 5"  (1.651 m)    Weight 77.6 kg    BMI (Calculated) 28.47    Grip Strength 15 kg              Nutrition Therapy Plan and Nutrition Goals:   Nutrition Assessments:  MEDIFICTS Score Key: ?70 Need to make dietary changes  40-70 Heart Healthy Diet ? 40 Therapeutic Level Cholesterol Diet   Picture Your Plate Scores: <93 Unhealthy dietary pattern with much room for improvement. 41-50 Dietary pattern unlikely to meet recommendations for good health and room for improvement. 51-60 More healthful dietary pattern, with some room for improvement.  >60 Healthy dietary pattern, although there may be some specific behaviors that could be improved.    Nutrition Goals Re-Evaluation:   Nutrition Goals Discharge (Final Nutrition Goals Re-Evaluation):   Psychosocial: Target Goals: Acknowledge presence or absence of significant depression and/or stress, maximize coping skills, provide positive support system. Participant is able to verbalize types and ability to use techniques and skills needed for reducing stress and depression.  Initial Review & Psychosocial Screening:  Initial Psych Review & Screening -  02/12/21 1105       Initial Review   Current issues with None Identified      Family Dynamics   Good Support System? Yes   Son and some good friends   Comments Good support system and no psychsocial concerns      Barriers   Psychosocial barriers to participate in program There are no identifiable barriers or psychosocial needs.      Screening Interventions   Interventions Encouraged to exercise             Quality of Life Scores:  Scores of 19 and below usually indicate a poorer quality of life in these areas.  A difference of  2-3 points is a clinically meaningful difference.  A difference of 2-3 points in the total score of the Quality of Life Index has been associated with significant improvement in overall quality of life, self-image, physical symptoms, and general health  in studies assessing change in quality of life.  PHQ-9: Recent Review Flowsheet Data     Depression screen [facility] 2/9 02/12/2021 09/02/2020   Decreased Interest 0 0   Down, Depressed, Hopeless 0 0   PHQ - 2 Score 0 0   Altered sleeping 0 0   Tired, decreased energy 0 0   Change in appetite 0 0   Feeling bad or failure about yourself  0 0   Trouble concentrating 0 0   Moving slowly or fidgety/restless 0 0   Suicidal thoughts 0 0   PHQ-9 Score 0 0   Difficult doing work/chores Not difficult at all Not difficult at all      Interpretation of Total Score  Total Score Depression Severity:  1-4 = Minimal depression, 5-9 = Mild depression, 10-14 = Moderate depression, 15-19 = Moderately severe depression, 20-27 = Severe depression   Psychosocial Evaluation and Intervention:  Psychosocial Evaluation - 02/12/21 1115       Psychosocial Evaluation & Interventions   Interventions Encouraged to exercise with the program and follow exercise prescription    Comments No psychosocial needs identified    Expected Outcomes For pt to continue to have no psychosocial concerns or barriers    Continue Psychosocial  Services  No Follow up required             Psychosocial Re-Evaluation:   Psychosocial Discharge (Final Psychosocial Re-Evaluation):   Education: Education Goals: Education classes will be provided on a weekly basis, covering required topics. Participant will state understanding/return demonstration of topics presented.  Learning Barriers/Preferences:  Learning Barriers/Preferences - 02/12/21 1132       Learning Barriers/Preferences   Learning Barriers None    Learning Preferences Computer/Internet;Individual Instruction;Group Instruction;Verbal Instruction;None;Video             Education Topics: Risk Factor Reduction:  -Group instruction that is supported by a PowerPoint presentation. Instructor discusses the definition of a risk factor, different risk factors for pulmonary disease, and how the heart and lungs work together.     Nutrition for Pulmonary Patient:  -Group instruction provided by PowerPoint slides, verbal discussion, and written materials to support subject matter. The instructor gives an explanation and review of healthy diet recommendations, which includes a discussion on weight management, recommendations for fruit and vegetable consumption, as well as protein, fluid, caffeine, fiber, sodium, sugar, and alcohol. Tips for eating when patients are short of breath are discussed.   Pursed Lip Breathing:  -Group instruction that is supported by demonstration and informational handouts. Instructor discusses the benefits of pursed lip and diaphragmatic breathing and detailed demonstration on how to preform both.     Oxygen Safety:  -Group instruction provided by PowerPoint, verbal discussion, and written material to support subject matter. There is an overview of "What is Oxygen" and "Why do we need it".  Instructor also reviews how to create a safe environment for oxygen use, the importance of using oxygen as prescribed, and the risks of noncompliance. There  is a brief discussion on traveling with oxygen and resources the patient may utilize.   Oxygen Equipment:  -Group instruction provided by [facility] Staff utilizing handouts, written materials, and equipment demonstrations.   Signs and Symptoms:  -Group instruction provided by written material and verbal discussion to support subject matter. Warning signs and symptoms of infection, stroke, and heart attack are reviewed and when to call the physician/911 reinforced. Tips for preventing the spread of infection discussed.   Advanced Directives:  -Group instruction  provided by verbal instruction and written material to support subject matter. Instructor reviews Advanced Directive laws and proper instruction for filling out document.   Pulmonary Video:  -Group video education that reviews the importance of medication and oxygen compliance, exercise, good nutrition, pulmonary hygiene, and pursed lip and diaphragmatic breathing for the pulmonary patient.   Exercise for the Pulmonary Patient:  -Group instruction that is supported by a PowerPoint presentation. Instructor discusses benefits of exercise, core components of exercise, frequency, duration, and intensity of an exercise routine, importance of utilizing pulse oximetry during exercise, safety while exercising, and options of places to exercise outside of rehab.     Pulmonary Medications:  -Verbally interactive group education provided by instructor with focus on inhaled medications and proper administration.   Anatomy and Physiology of the Respiratory System and Intimacy:  -Group instruction provided by PowerPoint, verbal discussion, and written material to support subject matter. Instructor reviews respiratory cycle and anatomical components of the respiratory system and their functions. Instructor also reviews differences in obstructive and restrictive respiratory diseases with examples of each. Intimacy, Sex, and Sexuality differences  are reviewed with a discussion on how relationships can change when diagnosed with pulmonary disease. Common sexual concerns are reviewed.   MD DAY -A group question and answer session with a medical doctor that allows participants to ask questions that relate to their pulmonary disease state.   OTHER EDUCATION -Group or individual verbal, written, or video instructions that support the educational goals of the pulmonary rehab program.   Holiday Eating Survival Tips:  -Group instruction provided by PowerPoint slides, verbal discussion, and written materials to support subject matter. The instructor gives patients tips, tricks, and techniques to help them not only survive but enjoy the holidays despite the onslaught of food that accompanies the holidays.   Knowledge Questionnaire Score:  Knowledge Questionnaire Score - 02/12/21 1125       Knowledge Questionnaire Score   Pre Score 17/18             Core Components/Risk Factors/Patient Goals at Admission:  Personal Goals and Risk Factors at Admission - 02/12/21 1118       Core Components/Risk Factors/Patient Goals on Admission   Improve shortness of breath with ADL's Yes    Intervention Provide education, individualized exercise plan and daily activity instruction to help decrease symptoms of SOB with activities of daily living.    Expected Outcomes Short Term: Improve cardiorespiratory fitness to achieve a reduction of symptoms when performing ADLs;Long Term: Be able to perform more ADLs without symptoms or delay the onset of symptoms             Core Components/Risk Factors/Patient Goals Review:   Goals and Risk Factor Review     Row Name 09/02/20 1103 02/12/21 1121           Core Components/Risk Factors/Patient Goals Review   Personal Goals Review Increase knowledge of respiratory medications and ability to use respiratory devices properly.;Develop more efficient breathing techniques such as purse lipped breathing  and diaphragmatic breathing and practicing self-pacing with activity.;Improve shortness of breath with ADL's Develop more efficient breathing techniques such as purse lipped breathing and diaphragmatic breathing and practicing self-pacing with activity.;Increase knowledge of respiratory medications and ability to use respiratory devices properly.;Improve shortness of breath with ADL's               Core Components/Risk Factors/Patient Goals at Discharge (Final Review):   Goals and Risk Factor Review - 02/12/21 1121  Core Components/Risk Factors/Patient Goals Review   Personal Goals Review Develop more efficient breathing techniques such as purse lipped breathing and diaphragmatic breathing and practicing self-pacing with activity.;Increase knowledge of respiratory medications and ability to use respiratory devices properly.;Improve shortness of breath with ADL's             ITP Comments:   Comments:

## 2021-02-12 NOTE — Progress Notes (Signed)
Virginia Burns 75 y.o. female Pulmonary Rehab Orientation Note This patient who was referred to Pulmonary rehab by Dr. Halford Chessman with the diagnosis of COPD stage 3 arrived today in Cardiac and Pulmonary Rehab. She arrived using rolling walker assistive device with normal gait. She does carry portable oxygen. Per pt, she uses oxygen continuously. Color good, skin warm and dry. Patient is oriented to time and place. Patient's medical history, psychosocial health, and medications reviewed. Psychosocial assessment reveals pt lives with their family. Pt is currently working as a Forensic psychologist part time. Pt hobbies include reading, painting and talking on the telephone. Pt reports her stress level is low. Pt states that she has no areas of stress in her life.  Pt does not exhibit signs of depression. PHQ score was 0/0. Pt shows good  coping skills with positive outlook.  Will continue to monitor and evaluate for psychosocial concerns and barriers every 30 days while participating in pulmonary rehab.Physical assessment reveals heart rate is normal, breath sounds clear to auscultation, no wheezes, rales, or rhonchi. Grip strength equal, strong. Distal pulses 1+ bilateral posterior tibial pulses present without peripheral edema. Patient reports she does take medications as prescribed. Patient states she follows a Regular diet. The patient reports no specific efforts to gain or lose weight.. Patient's weight will be monitored closely. Demonstration and practice of PLB using pulse oximeter. Patient able to return demonstration satisfactorily. Safety and hand hygiene in the exercise area reviewed with patient. Patient voices understanding of the information reviewed. Department expectations discussed with patient and achievable goals were set. The patient shows enthusiasm about attending the program and we look forward to working with this nice lady. The patient completed a 6 min walk test today 02/12/21 and to begin  exercise on 02/18/21 in 10:15 class. Arranged transportation through hospital for pulmonary rehab appointments. [phone]

## 2021-02-14 ENCOUNTER — Ambulatory Visit (HOSPITAL_COMMUNITY): Payer: PPO

## 2021-02-18 ENCOUNTER — Other Ambulatory Visit: Payer: Self-pay

## 2021-02-18 ENCOUNTER — Encounter (HOSPITAL_COMMUNITY)
Admission: RE | Admit: 2021-02-18 | Discharge: 2021-02-18 | Disposition: A | Discharge status: Home / Self Care | Payer: PPO | Source: Ambulatory Visit | Attending: Pulmonary Disease | Admitting: Pulmonary Disease

## 2021-02-18 ENCOUNTER — Ambulatory Visit (HOSPITAL_COMMUNITY): Payer: PPO

## 2021-02-18 VITALS — Wt 173.7 lb

## 2021-02-18 DIAGNOSIS — J449 Chronic obstructive pulmonary disease, unspecified: Secondary | ICD-10-CM

## 2021-02-18 NOTE — Progress Notes (Signed)
Daily Session Note  Patient Details  Name: Virginia Burns MRN: [id number] Date of Birth: May 14, 1946 Referring Provider:   April Manson Pulmonary Rehab Walk Test from 02/12/2021 in [place]  Referring Provider Dr. Halford Chessman       Encounter Date: 02/18/2021  Check In:  Session Check In - 02/18/21 1113       Check-In   Supervising physician immediately available to respond to emergencies [facility] immediately available    Physician(s) Dr. Gerlean Ren    Location [facility]    Staff Present Rosebud Poles, RN, Quentin Ore, MS, ACSM-CEP, Exercise Physiologist;Lisa Ysidro Evert, RN;David Lilyan Punt, MS, ACSM-CEP, CCRP, Exercise Physiologist    Virtual Visit No    Medication changes reported     No    Fall or balance concerns reported    No    Tobacco Cessation No Change    Warm-up and Cool-down Performed as group-led instruction    Resistance Training Performed Yes    VAD Patient? No    PAD/SET Patient? No      Pain Assessment   Currently in Pain? No/denies    Multiple Pain Sites No             Capillary Blood Glucose: No results found for this or any previous visit (from the past 24 hour(s)).    Social History   Tobacco Use  Smoking Status Former   Packs/day: 1.00   Years: 25.00   Pack years: 25.00   Types: Cigarettes   Quit date: 05/18/2009   Years since quitting: 11.7  Smokeless Tobacco Never    Goals Met:  Proper associated with RPD/PD & O2 Sat Exercise tolerated well No report of concerns or symptoms today Strength training completed today  Goals Unmet:  Not Applicable  Comments: Service time is from 1010 to 1132. Pt completed 1st day of exercise.   Dr. Fransico Him is Medical Director for Cardiac Rehab at [facility].

## 2021-02-20 ENCOUNTER — Ambulatory Visit (HOSPITAL_COMMUNITY): Payer: PPO

## 2021-02-20 ENCOUNTER — Other Ambulatory Visit: Payer: Self-pay

## 2021-02-20 ENCOUNTER — Encounter (HOSPITAL_COMMUNITY)
Admission: RE | Admit: 2021-02-20 | Discharge: 2021-02-20 | Disposition: A | Discharge status: Home / Self Care | Payer: PPO | Source: Ambulatory Visit | Attending: Pulmonary Disease | Admitting: Pulmonary Disease

## 2021-02-20 DIAGNOSIS — J449 Chronic obstructive pulmonary disease, unspecified: Secondary | ICD-10-CM | POA: Diagnosis not present

## 2021-02-25 ENCOUNTER — Ambulatory Visit (HOSPITAL_COMMUNITY): Payer: PPO

## 2021-02-25 ENCOUNTER — Encounter (HOSPITAL_COMMUNITY)
Admission: RE | Admit: 2021-02-25 | Discharge: 2021-02-25 | Disposition: A | Discharge status: Home / Self Care | Payer: PPO | Source: Ambulatory Visit | Attending: Pulmonary Disease | Admitting: Pulmonary Disease

## 2021-02-25 ENCOUNTER — Other Ambulatory Visit: Payer: Self-pay

## 2021-02-25 DIAGNOSIS — J449 Chronic obstructive pulmonary disease, unspecified: Secondary | ICD-10-CM

## 2021-02-25 NOTE — Progress Notes (Signed)
Daily Session Note  Patient Details  Name: Virginia Burns MRN: [id number] Date of Birth: [birth year] Referring Provider:   April Manson Pulmonary Rehab Walk Test from 02/12/2021 in [place]  Referring Provider Dr. Halford Chessman       Encounter Date: 02/25/2021  Check In:  Session Check In - 02/25/21 1042       Check-In   Supervising physician immediately available to respond to emergencies [facility] immediately available    Physician(s) Dr. Nevada Crane    Location [facility]    Staff Present Rosebud Poles, RN, Quentin Ore, MS, ACSM-CEP, Exercise Physiologist;Samyrah Bruster Ysidro Evert, RN    Virtual Visit No    Medication changes reported     No    Fall or balance concerns reported    No    Tobacco Cessation No Change    Warm-up and Cool-down Performed as group-led instruction    Resistance Training Performed Yes    VAD Patient? No    PAD/SET Patient? No      Pain Assessment   Currently in Pain? No/denies    Multiple Pain Sites No             Capillary Blood Glucose: No results found for this or any previous visit (from the past 24 hour(s)).    Social History   Tobacco Use  Smoking Status Former   Packs/day: 1.00   Years: 25.00   Pack years: 25.00   Types: Cigarettes   Quit date: 05/18/2009   Years since quitting: 11.7  Smokeless Tobacco Never    Goals Met:  Exercise tolerated well No report of concerns or symptoms today Strength training completed today  Goals Unmet:  Not Applicable  Comments: Service time is from 1022 to 1135    Dr. Fransico Him is Medical Director for Cardiac Rehab at [facility].

## 2021-02-26 ENCOUNTER — Telehealth: Payer: Self-pay | Admitting: Internal Medicine

## 2021-02-26 NOTE — Telephone Encounter (Signed)
Last office visit [phone].  Please advise

## 2021-02-26 NOTE — Telephone Encounter (Signed)
Inbound call from patient requesting additional refills for domperidone medication to be sent to [place] please.

## 2021-02-26 NOTE — Telephone Encounter (Signed)
Okay to refill for 1 year.  Follow-up in 1 year

## 2021-02-27 ENCOUNTER — Ambulatory Visit (HOSPITAL_COMMUNITY): Payer: PPO

## 2021-02-27 ENCOUNTER — Encounter (HOSPITAL_COMMUNITY)
Admission: RE | Admit: 2021-02-27 | Discharge: 2021-02-27 | Disposition: A | Discharge status: Home / Self Care | Payer: PPO | Source: Ambulatory Visit | Attending: Pulmonary Disease | Admitting: Pulmonary Disease

## 2021-02-27 ENCOUNTER — Other Ambulatory Visit: Payer: Self-pay

## 2021-02-27 VITALS — Wt 170.9 lb

## 2021-02-27 DIAGNOSIS — J449 Chronic obstructive pulmonary disease, unspecified: Secondary | ICD-10-CM

## 2021-02-27 DIAGNOSIS — Z23 Encounter for immunization: Secondary | ICD-10-CM | POA: Diagnosis not present

## 2021-02-27 MED ORDER — AMBULATORY NON FORMULARY MEDICATION: 10.0000 mg | Freq: Three times a day (TID) | 3 refills | Status: DC

## 2021-02-27 NOTE — Progress Notes (Signed)
Daily Session Note  Patient Details  Name: Virginia Burns MRN: [id number] Date of Birth: February 22, 1946 Referring Provider:   April Manson Pulmonary Rehab Walk Test from 02/12/2021 in [place]  Referring Provider Dr. Halford Chessman       Encounter Date: 02/27/2021  Check In:  Session Check In - 02/27/21 1030       Check-In   Supervising physician immediately available to respond to emergencies [facility] immediately available    Physician(s) Dr. Verlon Au    Location [facility]    Staff Present Rosebud Poles, RN, Quentin Ore, MS, ACSM-CEP, Exercise Physiologist;Lisa Ysidro Evert, RN    Virtual Visit No    Medication changes reported     No    Fall or balance concerns reported    No    Resistance Training Performed Yes    VAD Patient? No      Pain Assessment   Currently in Pain? No/denies    Multiple Pain Sites No             Capillary Blood Glucose: No results found for this or any previous visit (from the past 24 hour(s)).    Social History   Tobacco Use  Smoking Status Former   Packs/day: 1.00   Years: 25.00   Pack years: 25.00   Types: Cigarettes   Quit date: 05/18/2009   Years since quitting: 11.7  Smokeless Tobacco Never    Goals Met:  Proper associated with RPD/PD & O2 Sat Exercise tolerated well No report of concerns or symptoms today Strength training completed today  Goals Unmet:  Not Applicable  Comments: Service time is from 1015 to 1140.    Dr. Fransico Him is Medical Director for Cardiac Rehab at [facility].

## 2021-02-27 NOTE — Telephone Encounter (Signed)
Faxed Domperidone to [place].  Sent patient mychart message to let her know.

## 2021-03-03 DIAGNOSIS — J449 Chronic obstructive pulmonary disease, unspecified: Secondary | ICD-10-CM | POA: Diagnosis not present

## 2021-03-03 DIAGNOSIS — J432 Centrilobular emphysema: Secondary | ICD-10-CM | POA: Diagnosis not present

## 2021-03-04 ENCOUNTER — Ambulatory Visit (HOSPITAL_COMMUNITY): Payer: PPO

## 2021-03-04 ENCOUNTER — Encounter (HOSPITAL_COMMUNITY): Payer: PPO

## 2021-03-04 DIAGNOSIS — I700 Atherosclerosis of aorta: Secondary | ICD-10-CM | POA: Diagnosis not present

## 2021-03-04 DIAGNOSIS — W19XXXA Unspecified fall, initial encounter: Secondary | ICD-10-CM | POA: Diagnosis not present

## 2021-03-04 DIAGNOSIS — M47816 Spondylosis without myelopathy or radiculopathy, lumbar region: Secondary | ICD-10-CM | POA: Diagnosis not present

## 2021-03-04 DIAGNOSIS — S50312A Abrasion of left elbow, initial encounter: Secondary | ICD-10-CM | POA: Diagnosis not present

## 2021-03-04 DIAGNOSIS — M533 Sacrococcygeal disorders, not elsewhere classified: Secondary | ICD-10-CM | POA: Diagnosis not present

## 2021-03-04 DIAGNOSIS — S3993XA Unspecified injury of pelvis, initial encounter: Secondary | ICD-10-CM | POA: Diagnosis not present

## 2021-03-04 DIAGNOSIS — Z96698 Presence of other orthopedic joint implants: Secondary | ICD-10-CM | POA: Diagnosis not present

## 2021-03-04 NOTE — Progress Notes (Signed)
Pulmonary Individual Treatment Plan  Patient Details  Name: Virginia Burns MRN: [id number] Date of Birth: 07/06/1945 Referring Provider:   Flowsheet Row Pulmonary Rehab Walk Test from 02/12/2021 in [facility]  Referring Provider Dr. Sood       Initial Encounter Date:  Flowsheet Row Pulmonary Rehab Walk Test [facility]12/2021 [facility]  Date 02/12/21       Visit Diagnosis: Stage 3 severe COPD by GOLD classification (HCC)  Patient's Home Medications on Admission:   Current Outpatient Medications:    AMBULATORY NON FORMULARY MEDICATION, Take 10 mg by mouth 3 (three) times daily before meals. Medication Name: Domperidone, Disp: 270 tablet, Rfl: 3   Ascorbic Acid (VITAMIN C) 1000 MG tablet, Take 1,000 mg by mouth daily., Disp: , Rfl:    aspirin EC 81 MG tablet, Take 81 mg by mouth every other day., Disp: , Rfl:    budesonide-formoterol (SYMBICORT) 160-4.5 MCG/ACT inhaler, Inhale 2 puffs into the lungs 2 (two) times daily., Disp: 1 Inhaler, Rfl: 5   calcium carbonate (OS-CAL - DOSED IN MG OF ELEMENTAL CALCIUM) 1250 (500 Ca) MG tablet, Take 1 tablet by mouth. 600mg bid, Disp: , Rfl:    Cholecalciferol 125 MCG (5000 UT) TABS, Take by mouth., Disp: , Rfl:    Cranberry 1000 MG CAPS, Take by mouth., Disp: , Rfl:    denosumab (PROLIA) 60 MG/ML SOSY injection, Inject into the skin., Disp: , Rfl:    diltiazem (CARDIZEM CD) 240 MG 24 hr capsule, Take 1 capsule (240 mg total) by mouth daily., Disp: 90 capsule, Rfl: 3   Flaxseed, Linseed, (FLAX SEED OIL) 1000 MG CAPS, Take by mouth., Disp: , Rfl:    gabapentin (NEURONTIN) 600 MG tablet, Take 0.5 tablets by mouth 3 (three) times daily., Disp: , Rfl:    LORazepam (ATIVAN) 0.5 MG tablet, Take 0.5 mg by mouth as needed for anxiety. , Disp: , Rfl:    pantoprazole (PROTONIX) 40 MG tablet, Take 1 tablet by mouth 2 (two) times daily., Disp: , Rfl:    Probiotic CAPS, Take 1 capsule by mouth  daily., Disp: , Rfl:    sucralfate (CARAFATE) 1 GM/10ML suspension, TAKE 2 TEASPOONFUL (10 MLS) BY MOUTH  4 TIMES DAILY WITH MEALS AND AT BEDTIME (Patient taking differently: daily as needed. TAKE 2 TEASPOONFUL (10 MLS) BY MOUTH  4 TIMES DAILY WITH MEALS AND AT BEDTIME), Disp: 960 mL, Rfl: 1   tiotropium (SPIRIVA) 18 MCG inhalation capsule, Place 1 capsule (18 mcg total) into inhaler and inhale daily., Disp: 90 capsule, Rfl: 3   vitamin B-12 (CYANOCOBALAMIN) 1000 MCG tablet, Take 2,000 mcg by mouth daily., Disp: , Rfl:   Past Medical History: Past Medical History:  Diagnosis Date   Adjustment disorder with anxiety 08/17/2013   Allergic rhinitis    Anxiety state 08/17/2013   Overview:  IMPRESSION: hx of anxiety, lost a friend and has friend in ICU, using the ativan prn. had refilled 07/27/13   Aortic ejection murmur 07/14/2018   Benign neoplasm of ascending colon 12/07/2014   Benign neoplasm of descending colon 12/07/2014   Centrilobular emphysema (HCC) 12/07/2014   Chronic hyponatremia 10/05/2018   Last Assessment & Plan:  Formatting of this note is different from the original. Recent Labs    10/05/18 0007 10/06/18 0504 10/07/18 0733  NA 132* 131* 135   Baseline ~ 132   -stable   Chronic respiratory failure (HCC) 03/04/2015   Closed fracture of sacrum   and coccyx ([place]) 10/03/2018   Last Assessment & Plan:  Formatting of this note might be different from the original. Patient presented with progressively worsening lower back pain and inability to ambulate  MR: Acute fracture of the right greater than left sacral ala and S2 segment. CT: Nondisplaced, vertical and transverse acute sacral insufficiency Fractures  -s/p perQ fixation on 5/20  --Management per ortho primary team: P   Complication of anesthesia    Versed- hard time working - "dinging for days" - Colonoscopy   Constipation 12/09/2012   Formatting of this note might be different from the original. IMPRESSION: increased constipation: trial of  amitiza, to push fiber, can use mirlax prn.  eat 4 prunes a day. call prn. has had colonoscopy in past. is due next year for f/u   COPD (chronic obstructive pulmonary disease) ([place]) 02/10/2013   COPD (chronic obstructive pulmonary disease) with chronic bronchitis ([place]) 02/10/2013   Overnight pulse oximetry shows desaturations greater than 5 minutes at a time less than 88%.  Order signed for nocturnal O2 at 2 L/m February 21, 2016  Overview:  Overnight pulse oximetry shows desaturations greater than 5 minutes at a time less than 88%.  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 therapy 12/07/2014   Diverticulosis of colon 10/12/2007   Qualifier: Diagnosis of  By: Nils Pyle CMA (AAMA), Mearl Latin     Essential tremor 05/22/2013   Gastroparesis    GERD (gastroesophageal reflux disease)    GERD with esophagitis 10/12/2007   Qualifier: Diagnosis of  By: Nils Pyle CMA (AAMA), Mearl Latin    Overview:  Overview:  Qualifier: Diagnosis of  By: Nils Pyle CMA Deborra Medina), Mearl Latin  Overview:  Overview:  Qualifier: Diagnosis of  By: Nils Pyle CMA Deborra Medina), Leisha    History of tobacco abuse 09/18/2013   Humerus distal fracture 01/24/2018   Hx of colonic polyps    Hypertension    Hypertension, benign 07/19/2007   Qualifier: Diagnosis of  By: Garen Grams    Overview:  Overview:  STORY: The goal for blood pressure is less than 140/90.  If you are checking your blood pressure at home, please record it and bring it to your next office visit. 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  I have reviewed the recent sleep study results with the patient.  We discussed how sleep apnea can affect various health problems including risks for hypertension, cardiovascular disease, and diabetes.  We also discussed how sleep disruption can increase risks for accident, such as while driving.  Weight loss as a means of improving sl   OSA (obstructive sleep apnea) 09/18/2013   Osteoarthrosis 12/09/2012   Osteopenia of multiple sites 10/12/2007   Qualifier: Diagnosis of  By: Nils Pyle CMA (AAMA), Leisha     Osteoporosis    Palpitations 07/14/2018   Personal history of colonic  polyps 10/07/2009   tubular adenoma   Tachycardia, paroxysmal ([place]) 07/04/2018   Urinary tract infection    Vitamin B12 deficiency    Vitamin D deficiency 12/09/2012   Overview:  Overview:  IMPRESSION: Appropiate labs done today. We will send the results and adjust treatment as needed. Bone density discussed. There has  been an improvement in her bone density comparing the one in 2009 from the one 2011. From osteoporotic she went to osteopenic. She did take Actonel 3 years ago for several years and stopped it due to GI side effects. We will continue mantaining f    Tobacco Use: Social History   Tobacco Use  Smoking Status Former   Packs/day: 1.00   Years: 25.00   Pack years: 25.00   Types: Cigarettes   Quit date: 05/18/2009   Years since quitting: 11.8  Smokeless Tobacco Never    Labs: Recent Review Flowsheet Data     Labs for ITP Cardiac and Pulmonary Rehab Latest Ref Rng & Units 12/27/2012 11/21/2014   TCO2 0 - 100 mmol/L 27 26       Capillary Blood Glucose: No results found for: GLUCAP   Pulmonary Assessment Scores:  Pulmonary Assessment Scores     Row Name 02/12/21 1103         ADL UCSD   ADL  Phase Entry     SOB Score total 81           CAT Score   CAT Score 22           mMRC Score   mMRC Score 3             UCSD: Self-administered rating of dyspnea associated with activities of daily living (ADLs) 6-point scale (0 = "not at all" to 5 = "maximal or unable to do because of breathlessness")  Scoring Scores range from 0 to 120.  Minimally important difference is 5 units  CAT: CAT can identify the health impairment of COPD patients and is better correlated with disease progression.  CAT has a scoring range of zero to 40. The CAT score is classified into four groups of low (less than 10), medium (10 - 20), high (21-30) and very high (31-40) based on the impact level of disease on health status. A CAT score over 10 suggests significant symptoms.  A worsening CAT score could be explained by an exacerbation, poor medication adherence, poor inhaler technique, or progression of COPD or comorbid conditions.  CAT MCID is 2 points  mMRC: mMRC (Modified Medical Research Council) Dyspnea Scale is used to assess the degree of baseline functional disability in patients of respiratory disease due to dyspnea. No minimal important difference is established. A decrease in score of 1 point or greater is considered a positive change.   Pulmonary Function Assessment:  Pulmonary Function Assessment - 02/12/21 1102       Breath   Bilateral Breath Sounds Clear;Decreased    Shortness of Breath Fear of Shortness of Breath;Yes             Exercise Target Goals: Exercise Program Goal: Individual exercise prescription set using results from initial 6 min walk test and THRR while considering  patient's activity barriers and safety.   Exercise Prescription Goal: Initial exercise prescription builds to 30-45 minutes a day of aerobic activity, 2-3 days per week.  Home exercise guidelines will be given to patient during program as part of exercise prescription that the participant will  acknowledge.  Activity Barriers & Risk  Stratification:  Activity Barriers & Cardiac Risk Stratification - 02/12/21 1057       Activity Barriers & Cardiac Risk Stratification   Activity Barriers Arthritis;Back Problems;Joint Problems;Deconditioning;Muscular Weakness;Shortness of Breath             6 Minute Walk:  6 Minute Walk     Row Name 02/12/21 1140         6 Minute Walk   Phase Initial     Distance 705 feet     Walk Time 6 minutes     # of Rest Breaks 0     MPH 1.34     METS 1.77     RPE 13     Perceived Dyspnea  1     VO2 Peak 6.2     Symptoms Yes (comment)     Comments Pt stated she was fatigued and would feel sore tomorrow     Resting HR 73 bpm     Resting BP 146/60     Resting Oxygen Saturation  99 %     Exercise Oxygen Saturation  during 6 min walk 97 %     Max Ex. HR 111 bpm     Max Ex. BP 144/70     2 Minute Post BP 140/60           Interval HR   1 Minute HR 89     2 Minute HR 107     3 Minute HR 100     4 Minute HR 107     5 Minute HR 108     6 Minute HR 111     2 Minute Post HR 92     Interval Heart Rate? Yes           Interval Oxygen   Interval Oxygen? Yes     Baseline Oxygen Saturation % 99 %  2L     1 Minute Oxygen Saturation % 100 %     1 Minute Liters of Oxygen 2 L     2 Minute Oxygen Saturation % 97 %     2 Minute Liters of Oxygen 2 L     3 Minute Oxygen Saturation % 97 %     3 Minute Liters of Oxygen 2 L     4 Minute Oxygen Saturation % 97 %     4 Minute Liters of Oxygen 2 L     5 Minute Oxygen Saturation % 98 %     5 Minute Liters of Oxygen 2 L     6 Minute Oxygen Saturation % 97 %     6 Minute Liters of Oxygen 2 L     2 Minute Post Oxygen Saturation % 97 %     2 Minute Post Liters of Oxygen 2 L              Oxygen Initial Assessment:  Oxygen Initial Assessment - 02/12/21 1059       Home Oxygen   Home Oxygen Device Home Concentrator;Portable Concentrator    Sleep Oxygen Prescription Continuous    Liters per  minute 2    Home Exercise Oxygen Prescription Pulsed    Liters per minute 2    Home Resting Oxygen Prescription None    Compliance with Home Oxygen Use Yes      Initial 6 min Walk   Oxygen Used Continuous    Liters per minute 2      Program Oxygen Prescription   Program Oxygen Prescription Continuous  Liters per minute 2      Intervention   Short Term Goals To learn and exhibit compliance with exercise, home and travel O2 prescription;To learn and understand importance of monitoring SPO2 with pulse oximeter and demonstrate accurate use of the pulse oximeter.;To learn and understand importance of maintaining oxygen saturations>88%;To learn and demonstrate proper pursed lip breathing techniques or other breathing techniques. ;To learn and demonstrate proper use of respiratory medications    Long  Term Goals Exhibits compliance with exercise, home  and travel O2 prescription;Verbalizes importance of monitoring SPO2 with pulse oximeter and return demonstration;Maintenance of O2 saturations>88%;Exhibits proper breathing techniques, such as pursed lip breathing or other method taught during program session;Compliance with respiratory medication;Demonstrates proper use of MDI's             Oxygen Re-Evaluation:  Oxygen Re-Evaluation     Row Name 09/09/20 1641 02/17/21 1124           Program Oxygen Prescription   Program Oxygen Prescription Continuous Continuous      Liters per minute 2 2             Home Oxygen   Home Oxygen Device Portable Concentrator;Home Concentrator Portable Concentrator;Home Concentrator      Sleep Oxygen Prescription Continuous Continuous      Liters per minute 2 2      Home Exercise Oxygen Prescription Pulsed Pulsed      Liters per minute 2 2      Home Resting Oxygen Prescription None None      Compliance with Home Oxygen Use Yes Yes             Goals/Expected Outcomes   Short Term Goals To learn and exhibit compliance with exercise, home and travel  O2 prescription;To learn and understand importance of monitoring SPO2 with pulse oximeter and demonstrate accurate use of the pulse oximeter.;To learn and understand importance of maintaining oxygen saturations>88%;To learn and demonstrate proper pursed lip breathing techniques or other breathing techniques. ;To learn and demonstrate proper use of respiratory medications To learn and exhibit compliance with exercise, home and travel O2 prescription;To learn and understand importance of monitoring SPO2 with pulse oximeter and demonstrate accurate use of the pulse oximeter.;To learn and understand importance of maintaining oxygen saturations>88%;To learn and demonstrate proper pursed lip breathing techniques or other breathing techniques. ;To learn and demonstrate proper use of respiratory medications      Long  Term Goals Exhibits compliance with exercise, home  and travel O2 prescription;Verbalizes importance of monitoring SPO2 with pulse oximeter and return demonstration;Maintenance of O2 saturations>88%;Exhibits proper breathing techniques, such as pursed lip breathing or other method taught during program session;Compliance with respiratory medication;Demonstrates proper use of MDI's Exhibits compliance with exercise, home  and travel O2 prescription;Verbalizes importance of monitoring SPO2 with pulse oximeter and return demonstration;Maintenance of O2 saturations>88%;Exhibits proper breathing techniques, such as pursed lip breathing or other method taught during program session;Compliance with respiratory medication;Demonstrates proper use of MDI's      Goals/Expected Outcomes Compliance with monitoring oxygen saturation and maintaining above 88%. Also for pt to understand the importance of pursed lip breathing and when to use it. Compliance and understanding of monitoring oxygen saturation and importance of breathing techniques to decrease shortness of breath.               Oxygen Discharge (Final  Oxygen Re-Evaluation):  Oxygen Re-Evaluation - 02/17/21 1124       Program Oxygen Prescription   Program Oxygen Prescription Continuous  Liters per minute 2      Home Oxygen   Home Oxygen Device Portable Concentrator;Home Concentrator    Sleep Oxygen Prescription Continuous    Liters per minute 2    Home Exercise Oxygen Prescription Pulsed    Liters per minute 2    Home Resting Oxygen Prescription None    Compliance with Home Oxygen Use Yes      Goals/Expected Outcomes   Short Term Goals To learn and exhibit compliance with exercise, home and travel O2 prescription;To learn and understand importance of monitoring SPO2 with pulse oximeter and demonstrate accurate use of the pulse oximeter.;To learn and understand importance of maintaining oxygen saturations>88%;To learn and demonstrate proper pursed lip breathing techniques or other breathing techniques. ;To learn and demonstrate proper use of respiratory medications    Long  Term Goals Exhibits compliance with exercise, home  and travel O2 prescription;Verbalizes importance of monitoring SPO2 with pulse oximeter and return demonstration;Maintenance of O2 saturations>88%;Exhibits proper breathing techniques, such as pursed lip breathing or other method taught during program session;Compliance with respiratory medication;Demonstrates proper use of MDI's    Goals/Expected Outcomes Compliance and understanding of monitoring oxygen saturation and importance of breathing techniques to decrease shortness of breath.             Initial Exercise Prescription:  Initial Exercise Prescription - 02/12/21 1100       Date of Initial Exercise RX and Referring Provider   Date 02/12/21    Referring Provider Dr. Halford Chessman    Expected Discharge Date 04/17/21      Oxygen   Oxygen Continuous    Liters 2      NuStep   Level 1    SPM 75    Minutes 30      Prescription Details   Frequency (times per week) 2    Duration Progress to 30 minutes of  continuous aerobic without signs/symptoms of physical distress      Intensity   THRR 40-80% of Max Heartrate 58-117    Ratings of Perceived Exertion 11-13    Perceived Dyspnea 0-4      Progression   Progression Continue to progress workloads to maintain intensity without signs/symptoms of physical distress.      Resistance Training   Training Prescription Yes    Weight Red bands    Reps 10-15             Perform Capillary Blood Glucose checks as needed.  Exercise Prescription Changes:   Exercise Prescription Changes     Row Name 02/18/21 1100 03/04/21 1200           Response to Exercise   Blood Pressure (Admit) 130/70 134/70      Blood Pressure (Exercise) 100/70 --      Blood Pressure (Exit) 122/70 110/56      Heart Rate (Admit) 104 bpm 91 bpm      Heart Rate (Exercise) 95 bpm 87 bpm      Heart Rate (Exit) 91 bpm 83 bpm      Oxygen Saturation (Admit) 97 % 97 %      Oxygen Saturation (Exercise) 98 % 97 %      Oxygen Saturation (Exit) 98 % 97 %      Rating of Perceived Exertion (Exercise) 13 13      Perceived Dyspnea (Exercise) 1 1      Duration Progress to 30 minutes of  aerobic without signs/symptoms of physical distress Continue with 30 min of aerobic exercise without  signs/symptoms of physical distress.      Intensity THRR unchanged THRR unchanged             Progression   Progression Continue to progress workloads to maintain intensity without signs/symptoms of physical distress. Continue to progress workloads to maintain intensity without signs/symptoms of physical distress.             Resistance Training   Training Prescription -- Yes      Weight Red bands Red bands      Reps 10-15 10-15      Time 10 Minutes 10 Minutes             Oxygen   Oxygen Continuous Continuous      Liters 2 2             NuStep   Level 1 1      SPM 40 40      Minutes 30 30      METs 1.2 1.5               Exercise Comments:   Exercise Comments     Row Name  02/18/21 1201           Exercise Comments Pt completed 1st day of exercise. She is deconditioned and exercised on the Nustep for 30 min and averaged 40 SPM at level 1. She also fell and injured her knee as this limited her ROM. She perfomed the warmup and cooldown standing with her walker in front of her for stability.                Exercise Goals and Review:   Exercise Goals     Row Name 02/12/21 1147 02/17/21 1121           Exercise Goals   Increase Physical Activity Yes Yes      Intervention Provide advice, education, support and counseling about physical activity/exercise needs.;Develop an individualized exercise prescription for aerobic and resistive training based on initial evaluation findings, risk stratification, comorbidities and participant's personal goals. Provide advice, education, support and counseling about physical activity/exercise needs.;Develop an individualized exercise prescription for aerobic and resistive training based on initial evaluation findings, risk stratification, comorbidities and participant's personal goals.      Expected Outcomes Short Term: Attend rehab on a regular basis to increase amount of physical activity.;Long Term: Add in home exercise to make exercise part of routine and to increase amount of physical activity.;Long Term: Exercising regularly at least 3-5 days a week. Short Term: Attend rehab on a regular basis to increase amount of physical activity.;Long Term: Add in home exercise to make exercise part of routine and to increase amount of physical activity.;Long Term: Exercising regularly at least 3-5 days a week.      Increase Strength and Stamina Yes Yes      Intervention Provide advice, education, support and counseling about physical activity/exercise needs.;Develop an individualized exercise prescription for aerobic and resistive training based on initial evaluation findings, risk stratification, comorbidities and participant's  personal goals. Provide advice, education, support and counseling about physical activity/exercise needs.;Develop an individualized exercise prescription for aerobic and resistive training based on initial evaluation findings, risk stratification, comorbidities and participant's personal goals.      Expected Outcomes Short Term: Increase workloads from initial exercise prescription for resistance, speed, and METs.;Short Term: Perform resistance training exercises routinely during rehab and add in resistance training at home;Long Term: Improve cardiorespiratory fitness, muscular endurance and strength as measured by increased METs  and functional capacity (6MWT) Short Term: Increase workloads from initial exercise prescription for resistance, speed, and METs.;Short Term: Perform resistance training exercises routinely during rehab and add in resistance training at home;Long Term: Improve cardiorespiratory fitness, muscular endurance and strength as measured by increased METs and functional capacity (6MWT)      Able to understand and use rate of perceived exertion (RPE) scale Yes Yes      Intervention Provide education and explanation on how to use RPE scale Provide education and explanation on how to use RPE scale      Expected Outcomes Short Term: Able to use RPE daily in rehab to express subjective intensity level;Long Term:  Able to use RPE to guide intensity level when exercising independently Short Term: Able to use RPE daily in rehab to express subjective intensity level;Long Term:  Able to use RPE to guide intensity level when exercising independently      Able to understand and use Dyspnea scale Yes Yes      Intervention Provide education and explanation on how to use Dyspnea scale Provide education and explanation on how to use Dyspnea scale      Expected Outcomes Short Term: Able to use Dyspnea scale daily in rehab to express subjective sense of shortness of breath during exertion;Long Term: Able to  use Dyspnea scale to guide intensity level when exercising independently Short Term: Able to use Dyspnea scale daily in rehab to express subjective sense of shortness of breath during exertion;Long Term: Able to use Dyspnea scale to guide intensity level when exercising independently      Knowledge and understanding of Target Heart Rate Range (THRR) Yes Yes      Intervention Provide education and explanation of THRR including how the numbers were predicted and where they are located for reference Provide education and explanation of THRR including how the numbers were predicted and where they are located for reference      Expected Outcomes Short Term: Able to state/look up THRR;Long Term: Able to use THRR to govern intensity when exercising independently;Short Term: Able to use daily as guideline for intensity in rehab Short Term: Able to state/look up THRR;Long Term: Able to use THRR to govern intensity when exercising independently;Short Term: Able to use daily as guideline for intensity in rehab      Understanding of Exercise Prescription Yes Yes      Intervention Provide education, explanation, and written materials on patient's individual exercise prescription Provide education, explanation, and written materials on patient's individual exercise prescription      Expected Outcomes Short Term: Able to explain program exercise prescription;Long Term: Able to explain home exercise prescription to exercise independently Short Term: Able to explain program exercise prescription;Long Term: Able to explain home exercise prescription to exercise independently               Exercise Goals Re-Evaluation :  Exercise Goals Re-Evaluation     Row Name 09/09/20 1639 02/12/21 1148 02/17/21 1122         Exercise Goal Re-Evaluation   Exercise Goals Review Increase Physical Activity;Increase Strength and Stamina;Able to understand and use rate of perceived exertion (RPE) scale;Able to understand and use  Dyspnea scale;Knowledge and understanding of Target Heart Rate Range (THRR);Understanding of Exercise Prescription Increase Physical Activity;Increase Strength and Stamina;Able to understand and use rate of perceived exertion (RPE) scale;Able to understand and use Dyspnea scale;Knowledge and understanding of Target Heart Rate Range (THRR);Understanding of Exercise Prescription Increase Physical Activity;Increase Strength and Stamina;Able to understand  and use rate of perceived exertion (RPE) scale;Able to understand and use Dyspnea scale;Knowledge and understanding of Target Heart Rate Range (THRR);Understanding of Exercise Prescription     Comments Pt is scheduled to start exercise this week. We will monitor and progress as she is able. Pt is scheduled to start exercise next week. We will monitor and progress as she is able. Pt is scheduled to start exercise this week. We will monitor and progress as she is able.     Expected Outcomes Through exercise at rehab and home, the patient will decrease shortness of breath with daily activities and feel confident in carrying out an exercise regimn at home. Through exercise at rehab and home, the patient will decrease shortness of breath with daily activities and feel confident in carrying out an exercise regimn at home. Through exercise at rehab and home, the patient will decrease shortness of breath with daily activities and feel confident in carrying out an exercise regimn at home.              Discharge Exercise Prescription (Final Exercise Prescription Changes):  Exercise Prescription Changes - 03/04/21 1200       Response to Exercise   Blood Pressure (Admit) 134/70    Blood Pressure (Exit) 110/56    Heart Rate (Admit) 91 bpm    Heart Rate (Exercise) 87 bpm    Heart Rate (Exit) 83 bpm    Oxygen Saturation (Admit) 97 %    Oxygen Saturation (Exercise) 97 %    Oxygen Saturation (Exit) 97 %    Rating of Perceived Exertion (Exercise) 13    Perceived  Dyspnea (Exercise) 1    Duration Continue with 30 min of aerobic exercise without signs/symptoms of physical distress.    Intensity THRR unchanged      Progression   Progression Continue to progress workloads to maintain intensity without signs/symptoms of physical distress.      Resistance Training   Training Prescription Yes    Weight Red bands    Reps 10-15    Time 10 Minutes      Oxygen   Oxygen Continuous    Liters 2      NuStep   Level 1    SPM 40    Minutes 30    METs 1.5             Nutrition:  Target Goals: Understanding of nutrition guidelines, daily intake of sodium 1500mg , cholesterol 200mg , calories 30% from fat and 7% or less from saturated fats, daily to have 5 or more servings of fruits and vegetables.  Biometrics:  Pre Biometrics - 02/12/21 1059       Pre Biometrics   Height 5\' 5"  (1.651 m)    Weight 77.6 kg    BMI (Calculated) 28.47    Grip Strength 15 kg              Nutrition Therapy Plan and Nutrition Goals:   Nutrition Assessments:  MEDIFICTS Score Key: ?70 Need to make dietary changes  40-70 Heart Healthy Diet ? 40 Therapeutic Level Cholesterol Diet   Picture Your Plate Scores: <29 Unhealthy dietary pattern with much room for improvement. 41-50 Dietary pattern unlikely to meet recommendations for good health and room for improvement. 51-60 More healthful dietary pattern, with some room for improvement.  >60 Healthy dietary pattern, although there may be some specific behaviors that could be improved.    Nutrition Goals Re-Evaluation:   Nutrition Goals Discharge (Final Nutrition Goals Re-Evaluation):  Psychosocial: Target Goals: Acknowledge presence or absence of significant depression and/or stress, maximize coping skills, provide positive support system. Participant is able to verbalize types and ability to use techniques and skills needed for reducing stress and depression.  Initial Review & Psychosocial  Screening:  Initial Psych Review & Screening - 02/12/21 1105       Initial Review   Current issues with None Identified      Family Dynamics   Good Support System? Yes   Son and some good friends   Comments Good support system and no psychsocial concerns      Barriers   Psychosocial barriers to participate in program There are no identifiable barriers or psychosocial needs.      Screening Interventions   Interventions Encouraged to exercise             Quality of Life Scores:  Scores of 19 and below usually indicate a poorer quality of life in these areas.  A difference of  2-3 points is a clinically meaningful difference.  A difference of 2-3 points in the total score of the Quality of Life Index has been associated with significant improvement in overall quality of life, self-image, physical symptoms, and general health in studies assessing change in quality of life.  PHQ-9: Recent Review Flowsheet Data     Depression screen [facility] 2/9 02/12/2021 09/02/2020   Decreased Interest 0 0   Down, Depressed, Hopeless 0 0   PHQ - 2 Score 0 0   Altered sleeping 0 0   Tired, decreased energy 0 0   Change in appetite 0 0   Feeling bad or failure about yourself  0 0   Trouble concentrating 0 0   Moving slowly or fidgety/restless 0 0   Suicidal thoughts 0 0   PHQ-9 Score 0 0   Difficult doing work/chores Not difficult at all Not difficult at all      Interpretation of Total Score  Total Score Depression Severity:  1-4 = Minimal depression, 5-9 = Mild depression, 10-14 = Moderate depression, 15-19 = Moderately severe depression, 20-27 = Severe depression   Psychosocial Evaluation and Intervention:  Psychosocial Evaluation - 02/12/21 1115       Psychosocial Evaluation & Interventions   Interventions Encouraged to exercise with the program and follow exercise prescription    Comments No psychosocial needs identified    Expected Outcomes For pt to continue to have no psychosocial  concerns or barriers    Continue Psychosocial Services  No Follow up required             Psychosocial Re-Evaluation:  Psychosocial Re-Evaluation     Row Name 02/17/21 0958 02/17/21 1000           Psychosocial Re-Evaluation   Current issues with None Identified None Identified      Comments No psychosocial concerns or barriers were identified. Zetha has a supportive family and friends that assist her with transportation and supplies she needs.  Terrilee is very positive and shows no signs of depression and has a positive attitude.      Expected Outcomes For Vanda to continue to have no psychosocial concerns while participating in pulmonary rehab. For Glenda to continue to be without psychosocial concerns while participating in pulmonary rehab.      Interventions Encouraged to attend Pulmonary Rehabilitation for the exercise Encouraged to attend Pulmonary Rehabilitation for the exercise      Continue Psychosocial Services  No Follow up required No Follow up required  Psychosocial Discharge (Final Psychosocial Re-Evaluation):  Psychosocial Re-Evaluation - 02/17/21 1000       Psychosocial Re-Evaluation   Current issues with None Identified    Comments Domanique has a supportive family and friends that assist her with transportation and supplies she needs.  Ercie is very positive and shows no signs of depression and has a positive attitude.    Expected Outcomes For Maeleigh to continue to be without psychosocial concerns while participating in pulmonary rehab.    Interventions Encouraged to attend Pulmonary Rehabilitation for the exercise    Continue Psychosocial Services  No Follow up required             Education: Education Goals: Education classes will be provided on a weekly basis, covering required topics. Participant will state understanding/return demonstration of topics presented.  Learning Barriers/Preferences:  Learning Barriers/Preferences -  02/12/21 1132       Learning Barriers/Preferences   Learning Barriers None    Learning Preferences Computer/Internet;Individual Instruction;Group Instruction;Verbal Instruction;None;Video             Education Topics: Risk Factor Reduction:  -Group instruction that is supported by a PowerPoint presentation. Instructor discusses the definition of a risk factor, different risk factors for pulmonary disease, and how the heart and lungs work together.     Nutrition for Pulmonary Patient:  -Group instruction provided by PowerPoint slides, verbal discussion, and written materials to support subject matter. The instructor gives an explanation and review of healthy diet recommendations, which includes a discussion on weight management, recommendations for fruit and vegetable consumption, as well as protein, fluid, caffeine, fiber, sodium, sugar, and alcohol. Tips for eating when patients are short of breath are discussed.   Pursed Lip Breathing:  -Group instruction that is supported by demonstration and informational handouts. Instructor discusses the benefits of pursed lip and diaphragmatic breathing and detailed demonstration on how to preform both.     Oxygen Safety:  -Group instruction provided by PowerPoint, verbal discussion, and written material to support subject matter. There is an overview of "What is Oxygen" and "Why do we need it".  Instructor also reviews how to create a safe environment for oxygen use, the importance of using oxygen as prescribed, and the risks of noncompliance. There is a brief discussion on traveling with oxygen and resources the patient may utilize.   Oxygen Equipment:  -Group instruction provided by [facility] Staff utilizing handouts, written materials, and equipment demonstrations.   Signs and Symptoms:  -Group instruction provided by written material and verbal discussion to support subject matter. Warning signs and symptoms of infection, stroke, and  heart attack are reviewed and when to call the physician/911 reinforced. Tips for preventing the spread of infection discussed.   Advanced Directives:  -Group instruction provided by verbal instruction and written material to support subject matter. Instructor reviews Advanced Directive laws and proper instruction for filling out document.   Pulmonary Video:  -Group video education that reviews the importance of medication and oxygen compliance, exercise, good nutrition, pulmonary hygiene, and pursed lip and diaphragmatic breathing for the pulmonary patient.   Exercise for the Pulmonary Patient:  -Group instruction that is supported by a PowerPoint presentation. Instructor discusses benefits of exercise, core components of exercise, frequency, duration, and intensity of an exercise routine, importance of utilizing pulse oximetry during exercise, safety while exercising, and options of places to exercise outside of rehab.     Pulmonary Medications:  -Verbally interactive group education provided by instructor with focus on inhaled medications and  proper administration.   Anatomy and Physiology of the Respiratory System and Intimacy:  -Group instruction provided by PowerPoint, verbal discussion, and written material to support subject matter. Instructor reviews respiratory cycle and anatomical components of the respiratory system and their functions. Instructor also reviews differences in obstructive and restrictive respiratory diseases with examples of each. Intimacy, Sex, and Sexuality differences are reviewed with a discussion on how relationships can change when diagnosed with pulmonary disease. Common sexual concerns are reviewed.   MD DAY -A group question and answer session with a medical doctor that allows participants to ask questions that relate to their pulmonary disease state.   OTHER EDUCATION -Group or individual verbal, written, or video instructions that support the  educational goals of the pulmonary rehab program.   Holiday Eating Survival Tips:  -Group instruction provided by PowerPoint slides, verbal discussion, and written materials to support subject matter. The instructor gives patients tips, tricks, and techniques to help them not only survive but enjoy the holidays despite the onslaught of food that accompanies the holidays.   Knowledge Questionnaire Score:  Knowledge Questionnaire Score - 02/12/21 1125       Knowledge Questionnaire Score   Pre Score 17/18             Core Components/Risk Factors/Patient Goals at Admission:  Personal Goals and Risk Factors at Admission - 02/12/21 1118       Core Components/Risk Factors/Patient Goals on Admission   Improve shortness of breath with ADL's Yes    Intervention Provide education, individualized exercise plan and daily activity instruction to help decrease symptoms of SOB with activities of daily living.    Expected Outcomes Short Term: Improve cardiorespiratory fitness to achieve a reduction of symptoms when performing ADLs;Long Term: Be able to perform more ADLs without symptoms or delay the onset of symptoms             Core Components/Risk Factors/Patient Goals Review:   Goals and Risk Factor Review     Row Name 02/12/21 1121 02/17/21 1000 02/17/21 1004         Core Components/Risk Factors/Patient Goals Review   Personal Goals Review Develop more efficient breathing techniques such as purse lipped breathing and diaphragmatic breathing and practicing self-pacing with activity.;Increase knowledge of respiratory medications and ability to use respiratory devices properly.;Improve shortness of breath with ADL's Increase knowledge of respiratory medications and ability to use respiratory devices properly. Increase knowledge of respiratory medications and ability to use respiratory devices properly.;Improve shortness of breath with ADL's;Develop more efficient breathing techniques such  as purse lipped breathing and diaphragmatic breathing and practicing self-pacing with activity.     Review -- -- Aylssa will begin exercising in pulmonary rehab 02/18/2021 so it is too soon for her to meet any program or personal goals.     Expected Outcomes -- -- See admission goals.              Core Components/Risk Factors/Patient Goals at Discharge (Final Review):   Goals and Risk Factor Review - 02/17/21 1004       Core Components/Risk Factors/Patient Goals Review   Personal Goals Review Increase knowledge of respiratory medications and ability to use respiratory devices properly.;Improve shortness of breath with ADL's;Develop more efficient breathing techniques such as purse lipped breathing and diaphragmatic breathing and practicing self-pacing with activity.    Review Chancie will begin exercising in pulmonary rehab 02/18/2021 so it is too soon for her to meet any program or personal goals.    Expected  Outcomes See admission goals.             ITP Comments:   Comments: ITP REVIEW Pt is making expected progress toward Pulmonary Rehab goals after completing 4 sessions. Recommend continued exercise, life style modification, education, and utilization of breathing techniques to increase stamina and strength, while also decreasing shortness of breath with exertion.

## 2021-03-06 ENCOUNTER — Ambulatory Visit (HOSPITAL_COMMUNITY): Payer: PPO

## 2021-03-06 ENCOUNTER — Encounter (HOSPITAL_COMMUNITY)
Admission: RE | Admit: 2021-03-06 | Discharge: 2021-03-06 | Disposition: A | Discharge status: Home / Self Care | Payer: PPO | Source: Ambulatory Visit | Attending: Pulmonary Disease | Admitting: Pulmonary Disease

## 2021-03-06 DIAGNOSIS — J449 Chronic obstructive pulmonary disease, unspecified: Secondary | ICD-10-CM

## 2021-03-11 ENCOUNTER — Encounter (HOSPITAL_COMMUNITY)
Admission: RE | Admit: 2021-03-11 | Discharge: 2021-03-11 | Disposition: A | Discharge status: Home / Self Care | Payer: PPO | Source: Ambulatory Visit | Attending: Pulmonary Disease | Admitting: Pulmonary Disease

## 2021-03-11 ENCOUNTER — Other Ambulatory Visit: Payer: Self-pay

## 2021-03-11 ENCOUNTER — Ambulatory Visit (HOSPITAL_COMMUNITY): Payer: PPO

## 2021-03-11 DIAGNOSIS — S50312A Abrasion of left elbow, initial encounter: Secondary | ICD-10-CM | POA: Diagnosis not present

## 2021-03-11 DIAGNOSIS — L089 Local infection of the skin and subcutaneous tissue, unspecified: Secondary | ICD-10-CM | POA: Diagnosis not present

## 2021-03-11 DIAGNOSIS — J449 Chronic obstructive pulmonary disease, unspecified: Secondary | ICD-10-CM

## 2021-03-11 NOTE — Progress Notes (Signed)
Daily Session Note  Patient Details  Name: Virginia Burns MRN: [id number] Date of Birth: 06/22/1945 Referring Provider:   April Manson Pulmonary Rehab Walk Test from 02/12/2021 in [place]  Referring Provider Dr. Halford Chessman       Encounter Date: 03/11/2021  Check In:  Session Check In - 03/11/21 1104       Check-In   Supervising physician immediately available to respond to emergencies [facility] immediately available    Physician(s) Dr. Karleen Hampshire    Location [facility]    Staff Present Rosebud Poles, RN, Quentin Ore, MS, ACSM-CEP, Exercise Physiologist;Lisa Ysidro Evert, RN    Virtual Visit No    Medication changes reported     No    Fall or balance concerns reported    No    Tobacco Cessation No Change    Warm-up and Cool-down Performed as group-led instruction    Resistance Training Performed Yes    VAD Patient? No    PAD/SET Patient? No      Pain Assessment   Currently in Pain? No/denies    Multiple Pain Sites No             Capillary Blood Glucose: No results found for this or any previous visit (from the past 24 hour(s)).    Social History   Tobacco Use  Smoking Status Former   Packs/day: 1.00   Years: 25.00   Pack years: 25.00   Types: Cigarettes   Quit date: 05/18/2009   Years since quitting: 11.8  Smokeless Tobacco Never    Goals Met:  Proper associated with RPD/PD & O2 Sat Exercise tolerated well No report of concerns or symptoms today Strength training completed today  Goals Unmet:  Not Applicable  Comments: Service time is from 1015 to 1125.    Dr. Rodman Pickle is Medical Director for Pulmonary Rehab at [facility].

## 2021-03-13 ENCOUNTER — Encounter (HOSPITAL_COMMUNITY)
Admission: RE | Admit: 2021-03-13 | Discharge: 2021-03-13 | Disposition: A | Discharge status: Home / Self Care | Payer: PPO | Source: Ambulatory Visit | Attending: Pulmonary Disease | Admitting: Pulmonary Disease

## 2021-03-13 ENCOUNTER — Other Ambulatory Visit: Payer: Self-pay

## 2021-03-13 ENCOUNTER — Ambulatory Visit (HOSPITAL_COMMUNITY): Payer: PPO

## 2021-03-13 DIAGNOSIS — J449 Chronic obstructive pulmonary disease, unspecified: Secondary | ICD-10-CM

## 2021-03-13 NOTE — Progress Notes (Signed)
Daily Session Note  Patient Details  Name: LEONI GOODNESS MRN: [id number] Date of Birth: [birth year] Referring Provider:   April Manson Pulmonary Rehab Walk Test from 02/12/2021 in [place]  Referring Provider Dr. Halford Chessman       Encounter Date: 03/13/2021  Check In:  Session Check In - 03/13/21 1058       Check-In   Supervising physician immediately available to respond to emergencies [facility] immediately available    Physician(s) Dr. Verlon Au    Location [facility]    Staff Present Rosebud Poles, RN, Quentin Ore, MS, ACSM-CEP, Exercise Physiologist;Kyrra Prada Ysidro Evert, RN    Virtual Visit No    Medication changes reported     No    Fall or balance concerns reported    No    Tobacco Cessation No Change    Warm-up and Cool-down Performed as group-led instruction    Resistance Training Performed Yes    VAD Patient? No    PAD/SET Patient? No      Pain Assessment   Currently in Pain? No/denies    Multiple Pain Sites No             Capillary Blood Glucose: No results found for this or any previous visit (from the past 24 hour(s)).    Social History   Tobacco Use  Smoking Status Former   Packs/day: 1.00   Years: 25.00   Pack years: 25.00   Types: Cigarettes   Quit date: 05/18/2009   Years since quitting: 11.8  Smokeless Tobacco Never    Goals Met:  Exercise tolerated well No report of concerns or symptoms today Strength training completed today  Goals Unmet:  Not Applicable  Comments: Service time is from 1017 to 1125    Dr. Rodman Pickle is Medical Director for Pulmonary Rehab at [facility].

## 2021-03-18 ENCOUNTER — Other Ambulatory Visit: Payer: Self-pay

## 2021-03-18 ENCOUNTER — Ambulatory Visit (HOSPITAL_COMMUNITY): Payer: PPO

## 2021-03-18 ENCOUNTER — Encounter (HOSPITAL_COMMUNITY)
Admission: RE | Admit: 2021-03-18 | Discharge: 2021-03-18 | Disposition: A | Discharge status: Home / Self Care | Payer: PPO | Source: Ambulatory Visit | Attending: Pulmonary Disease | Admitting: Pulmonary Disease

## 2021-03-18 VITALS — Wt 170.9 lb

## 2021-03-18 DIAGNOSIS — R0602 Shortness of breath: Secondary | ICD-10-CM | POA: Diagnosis not present

## 2021-03-18 DIAGNOSIS — J449 Chronic obstructive pulmonary disease, unspecified: Secondary | ICD-10-CM | POA: Diagnosis not present

## 2021-03-18 NOTE — Progress Notes (Signed)
Daily Session Note  Patient Details  Name: Virginia Burns MRN: [id number] Date of Birth: October 29, 1945 Referring Provider:   April Manson Pulmonary Rehab Walk Test from 02/12/2021 in [place]  Referring Provider Dr. Halford Chessman       Encounter Date: 03/18/2021  Check In:  Session Check In - 03/18/21 1048       Check-In   Supervising physician immediately available to respond to emergencies [facility] immediately available    Physician(s) Dr. Dwyane Dee    Location [facility]    Staff Present Rosebud Poles, RN, Quentin Ore, MS, ACSM-CEP, Exercise Physiologist;Jaz Mallick Ysidro Evert, RN    Virtual Visit No    Medication changes reported     No    Fall or balance concerns reported    No    Tobacco Cessation No Change    Warm-up and Cool-down Performed as group-led instruction    Resistance Training Performed Yes    VAD Patient? No    PAD/SET Patient? No      Pain Assessment   Currently in Pain? No/denies    Multiple Pain Sites No             Capillary Blood Glucose: No results found for this or any previous visit (from the past 24 hour(s)).   Exercise Prescription Changes - 03/18/21 1100       Response to Exercise   Blood Pressure (Admit) 130/60    Blood Pressure (Exercise) 140/60    Blood Pressure (Exit) 110/60    Heart Rate (Admit) 106 bpm    Heart Rate (Exercise) 98 bpm    Heart Rate (Exit) 92 bpm    Oxygen Saturation (Admit) 97 %    Oxygen Saturation (Exercise) 97 %    Oxygen Saturation (Exit) 98 %    Rating of Perceived Exertion (Exercise) 13    Perceived Dyspnea (Exercise) 1    Duration Continue with 30 min of aerobic exercise without signs/symptoms of physical distress.    Intensity THRR unchanged      Progression   Progression Continue to progress workloads to maintain intensity without signs/symptoms of physical distress.      Resistance Training   Training Prescription Yes    Weight --   Doing the reps,  but not using the bands due to injury from fall.   Reps 10-15    Time 10 Minutes      Oxygen   Oxygen Continuous    Liters 2      NuStep   Level 2    SPM 60    Minutes 30    METs 1.5             Social History   Tobacco Use  Smoking Status Former   Packs/day: 1.00   Years: 25.00   Pack years: 25.00   Types: Cigarettes   Quit date: 05/18/2009   Years since quitting: 11.8  Smokeless Tobacco Never    Goals Met:  Exercise tolerated well No report of concerns or symptoms today Strength training completed today  Goals Unmet:  Not Applicable  Comments: Service time is from 1013 to 1130    Dr. Rodman Pickle is Medical Director for Pulmonary Rehab at [facility].

## 2021-03-20 ENCOUNTER — Ambulatory Visit (HOSPITAL_COMMUNITY): Payer: PPO

## 2021-03-20 ENCOUNTER — Other Ambulatory Visit: Payer: Self-pay

## 2021-03-20 ENCOUNTER — Encounter (HOSPITAL_COMMUNITY)
Admission: RE | Admit: 2021-03-20 | Discharge: 2021-03-20 | Disposition: A | Discharge status: Home / Self Care | Payer: PPO | Source: Ambulatory Visit | Attending: Pulmonary Disease | Admitting: Pulmonary Disease

## 2021-03-20 DIAGNOSIS — J449 Chronic obstructive pulmonary disease, unspecified: Secondary | ICD-10-CM | POA: Diagnosis not present

## 2021-03-20 NOTE — Progress Notes (Signed)
Daily Session Note  Patient Details  Name: Virginia Burns MRN: [id number] Date of Birth: [birth year] Referring Provider:   April Manson Pulmonary Rehab Walk Test from 02/12/2021 in [place]  Referring Provider Dr. Halford Chessman       Encounter Date: 03/20/2021  Check In:  Session Check In - 03/20/21 1125       Check-In   Supervising physician immediately available to respond to emergencies [facility] immediately available    Physician(s) Dr. Frederic Jericho    [facility]    Staff Present Rosebud Poles, RN, Quentin Ore, MS, ACSM-CEP, Exercise Physiologist    Virtual Visit No    Medication changes reported     No    Fall or balance concerns reported    No    Tobacco Cessation No Change    Warm-up and Cool-down Performed as group-led instruction    Resistance Training Performed Yes    VAD Patient? No    PAD/SET Patient? No      Pain Assessment   Currently in Pain? No/denies    Multiple Pain Sites No             Capillary Blood Glucose: No results found for this or any previous visit (from the past 24 hour(s)).    Social History   Tobacco Use  Smoking Status Former   Packs/day: 1.00   Years: 25.00   Pack years: 25.00   Types: Cigarettes   Quit date: 05/18/2009   Years since quitting: 11.8  Smokeless Tobacco Never    Goals Met:  Proper associated with RPD/PD & O2 Sat Exercise tolerated well No report of concerns or symptoms today Strength training completed today  Goals Unmet:  Not Applicable  Comments: Service time is from 1019 to 1136.    Dr. Rodman Pickle is Medical Director for Pulmonary Rehab at [facility].

## 2021-03-24 NOTE — Progress Notes (Signed)
Pulmonary Individual Treatment Plan  Patient Details  Name: Virginia Burns MRN: [id number] Date of Birth: 07/06/1945 Referring Provider:   Flowsheet Row Pulmonary Rehab Walk Test from 02/12/2021 in [facility]  Referring Provider Dr. Sood       Initial Encounter Date:  Flowsheet Row Pulmonary Rehab Walk Test [facility]12/2021 [facility]  Date 02/12/21       Visit Diagnosis: Stage 3 severe COPD by GOLD classification (HCC)  Patient's Home Medications on Admission:   Current Outpatient Medications:    AMBULATORY NON FORMULARY MEDICATION, Take 10 mg by mouth 3 (three) times daily before meals. Medication Name: Domperidone, Disp: 270 tablet, Rfl: 3   Ascorbic Acid (VITAMIN C) 1000 MG tablet, Take 1,000 mg by mouth daily., Disp: , Rfl:    aspirin EC 81 MG tablet, Take 81 mg by mouth every other day., Disp: , Rfl:    budesonide-formoterol (SYMBICORT) 160-4.5 MCG/ACT inhaler, Inhale 2 puffs into the lungs 2 (two) times daily., Disp: 1 Inhaler, Rfl: 5   calcium carbonate (OS-CAL - DOSED IN MG OF ELEMENTAL CALCIUM) 1250 (500 Ca) MG tablet, Take 1 tablet by mouth. 600mg bid, Disp: , Rfl:    Cholecalciferol 125 MCG (5000 UT) TABS, Take by mouth., Disp: , Rfl:    Cranberry 1000 MG CAPS, Take by mouth., Disp: , Rfl:    denosumab (PROLIA) 60 MG/ML SOSY injection, Inject into the skin., Disp: , Rfl:    diltiazem (CARDIZEM CD) 240 MG 24 hr capsule, Take 1 capsule (240 mg total) by mouth daily., Disp: 90 capsule, Rfl: 3   Flaxseed, Linseed, (FLAX SEED OIL) 1000 MG CAPS, Take by mouth., Disp: , Rfl:    gabapentin (NEURONTIN) 600 MG tablet, Take 0.5 tablets by mouth 3 (three) times daily., Disp: , Rfl:    LORazepam (ATIVAN) 0.5 MG tablet, Take 0.5 mg by mouth as needed for anxiety. , Disp: , Rfl:    pantoprazole (PROTONIX) 40 MG tablet, Take 1 tablet by mouth 2 (two) times daily., Disp: , Rfl:    Probiotic CAPS, Take 1 capsule by mouth  daily., Disp: , Rfl:    sucralfate (CARAFATE) 1 GM/10ML suspension, TAKE 2 TEASPOONFUL (10 MLS) BY MOUTH  4 TIMES DAILY WITH MEALS AND AT BEDTIME (Patient taking differently: daily as needed. TAKE 2 TEASPOONFUL (10 MLS) BY MOUTH  4 TIMES DAILY WITH MEALS AND AT BEDTIME), Disp: 960 mL, Rfl: 1   tiotropium (SPIRIVA) 18 MCG inhalation capsule, Place 1 capsule (18 mcg total) into inhaler and inhale daily., Disp: 90 capsule, Rfl: 3   vitamin B-12 (CYANOCOBALAMIN) 1000 MCG tablet, Take 2,000 mcg by mouth daily., Disp: , Rfl:   Past Medical History: Past Medical History:  Diagnosis Date   Adjustment disorder with anxiety 08/17/2013   Allergic rhinitis    Anxiety state 08/17/2013   Overview:  IMPRESSION: hx of anxiety, lost a friend and has friend in ICU, using the ativan prn. had refilled 07/27/13   Aortic ejection murmur 07/14/2018   Benign neoplasm of ascending colon 12/07/2014   Benign neoplasm of descending colon 12/07/2014   Centrilobular emphysema (HCC) 12/07/2014   Chronic hyponatremia 10/05/2018   Last Assessment & Plan:  Formatting of this note is different from the original. Recent Labs    10/05/18 0007 10/06/18 0504 10/07/18 0733  NA 132* 131* 135   Baseline ~ 132   -stable   Chronic respiratory failure (HCC) 03/04/2015   Closed fracture of sacrum   and coccyx ([place]) 10/03/2018   Last Assessment & Plan:  Formatting of this note might be different from the original. Patient presented with progressively worsening lower back pain and inability to ambulate  MR: Acute fracture of the right greater than left sacral ala and S2 segment. CT: Nondisplaced, vertical and transverse acute sacral insufficiency Fractures  -s/p perQ fixation on 5/20  --Management per ortho primary team: P   Complication of anesthesia    Versed- hard time working - "dinging for days" - Colonoscopy   Constipation 12/09/2012   Formatting of this note might be different from the original. IMPRESSION: increased constipation: trial of  amitiza, to push fiber, can use mirlax prn.  eat 4 prunes a day. call prn. has had colonoscopy in past. is due next year for f/u   COPD (chronic obstructive pulmonary disease) ([place]) 02/10/2013   COPD (chronic obstructive pulmonary disease) with chronic bronchitis ([place]) 02/10/2013   Overnight pulse oximetry shows desaturations greater than 5 minutes at a time less than 88%.  Order signed for nocturnal O2 at 2 L/m February 21, 2016  Overview:  Overnight pulse oximetry shows desaturations greater than 5 minutes at a time less than 88%.  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 therapy 12/07/2014   Diverticulosis of colon 10/12/2007   Qualifier: Diagnosis of  By: Nils Pyle CMA (AAMA), Mearl Latin     Essential tremor 05/22/2013   Gastroparesis    GERD (gastroesophageal reflux disease)    GERD with esophagitis 10/12/2007   Qualifier: Diagnosis of  By: Nils Pyle CMA (AAMA), Mearl Latin    Overview:  Overview:  Qualifier: Diagnosis of  By: Nils Pyle CMA Deborra Medina), Mearl Latin  Overview:  Overview:  Qualifier: Diagnosis of  By: Nils Pyle CMA Deborra Medina), Leisha    History of tobacco abuse 09/18/2013   Humerus distal fracture 01/24/2018   Hx of colonic polyps    Hypertension    Hypertension, benign 07/19/2007   Qualifier: Diagnosis of  By: Garen Grams    Overview:  Overview:  STORY: The goal for blood pressure is less than 140/90.  If you are checking your blood pressure at home, please record it and bring it to your next office visit. 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  I have reviewed the recent sleep study results with the patient.  We discussed how sleep apnea can affect various health problems including risks for hypertension, cardiovascular disease, and diabetes.  We also discussed how sleep disruption can increase risks for accident, such as while driving.  Weight loss as a means of improving sl   OSA (obstructive sleep apnea) 09/18/2013   Osteoarthrosis 12/09/2012   Osteopenia of multiple sites 10/12/2007   Qualifier: Diagnosis of  By: Nils Pyle CMA (AAMA), Leisha     Osteoporosis    Palpitations 07/14/2018   Personal history of colonic  polyps 10/07/2009   tubular adenoma   Tachycardia, paroxysmal ([place]) 07/04/2018   Urinary tract infection    Vitamin B12 deficiency    Vitamin D deficiency 12/09/2012   Overview:  Overview:  IMPRESSION: Appropiate labs done today. We will send the results and adjust treatment as needed. Bone density discussed. There has  been an improvement in her bone density comparing the one in 2009 from the one 2011. From osteoporotic she went to osteopenic. She did take Actonel 3 years ago for several years and stopped it due to GI side effects. We will continue mantaining f    Tobacco Use: Social History   Tobacco Use  Smoking Status Former   Packs/day: 1.00   Years: 25.00   Pack years: 25.00   Types: Cigarettes   Quit date: 05/18/2009   Years since quitting: 11.8  Smokeless Tobacco Never    Labs: Recent Review Flowsheet Data     Labs for ITP Cardiac and Pulmonary Rehab Latest Ref Rng & Units 12/27/2012 11/21/2014   TCO2 0 - 100 mmol/L 27 26       Capillary Blood Glucose: No results found for: GLUCAP   Pulmonary Assessment Scores:  Pulmonary Assessment Scores     Row Name 02/12/21 1103         ADL UCSD   ADL  Phase Entry     SOB Score total 81       CAT Score   CAT Score 22       mMRC Score   mMRC Score 3             UCSD: Self-administered rating of dyspnea associated with activities of daily living (ADLs) 6-point scale (0 = "not at all" to 5 = "maximal or unable to do because of breathlessness")  Scoring Scores range from 0 to 120.  Minimally important difference is 5 units  CAT: CAT can identify the health impairment of COPD patients and is better correlated with disease progression.  CAT has a scoring range of zero to 40. The CAT score is classified into four groups of low (less than 10), medium (10 - 20), high (21-30) and very high (31-40) based on the impact level of disease on health status. A CAT score over 10 suggests significant symptoms.  A worsening CAT score could be explained by an exacerbation, poor medication adherence, poor inhaler technique, or progression of COPD or comorbid conditions.  CAT MCID is 2 points  mMRC: mMRC (Modified Medical Research Council) Dyspnea Scale is used to assess the degree of baseline functional disability in patients of respiratory disease due to dyspnea. No minimal important difference is established. A decrease in score of 1 point or greater is considered a positive change.   Pulmonary Function Assessment:  Pulmonary Function Assessment - 02/12/21 1102       Breath   Bilateral Breath Sounds Clear;Decreased    Shortness of Breath Fear of Shortness of Breath;Yes             Exercise Target Goals: Exercise Program Goal: Individual exercise prescription set using results from initial 6 min walk test and THRR while considering  patient's activity barriers and safety.   Exercise Prescription Goal: Initial exercise prescription builds to 30-45 minutes a day of aerobic activity, 2-3 days per week.  Home exercise guidelines will be given to patient during program as part of exercise prescription that the participant will  acknowledge.  Activity Barriers & Risk Stratification:  Activity Barriers & Cardiac Risk Stratification -  02/12/21 1057       Activity Barriers & Cardiac Risk Stratification   Activity Barriers Arthritis;Back Problems;Joint Problems;Deconditioning;Muscular Weakness;Shortness of Breath             6 Minute Walk:  6 Minute Walk     Row Name 02/12/21 1140         6 Minute Walk   Phase Initial     Distance 705 feet     Walk Time 6 minutes     # of Rest Breaks 0     MPH 1.34     METS 1.77     RPE 13     Perceived Dyspnea  1     VO2 Peak 6.2     Symptoms Yes (comment)     Comments Pt stated she was fatigued and would feel sore tomorrow     Resting HR 73 bpm     Resting BP 146/60     Resting Oxygen Saturation  99 %     Exercise Oxygen Saturation  during 6 min walk 97 %     Max Ex. HR 111 bpm     Max Ex. BP 144/70     2 Minute Post BP 140/60       Interval HR   1 Minute HR 89     2 Minute HR 107     3 Minute HR 100     4 Minute HR 107     5 Minute HR 108     6 Minute HR 111     2 Minute Post HR 92     Interval Heart Rate? Yes       Interval Oxygen   Interval Oxygen? Yes     Baseline Oxygen Saturation % 99 %  2L     1 Minute Oxygen Saturation % 100 %     1 Minute Liters of Oxygen 2 L     2 Minute Oxygen Saturation % 97 %     2 Minute Liters of Oxygen 2 L     3 Minute Oxygen Saturation % 97 %     3 Minute Liters of Oxygen 2 L     4 Minute Oxygen Saturation % 97 %     4 Minute Liters of Oxygen 2 L     5 Minute Oxygen Saturation % 98 %     5 Minute Liters of Oxygen 2 L     6 Minute Oxygen Saturation % 97 %     6 Minute Liters of Oxygen 2 L     2 Minute Post Oxygen Saturation % 97 %     2 Minute Post Liters of Oxygen 2 L              Oxygen Initial Assessment:  Oxygen Initial Assessment - 02/12/21 1059       Home Oxygen   Home Oxygen Device Home Concentrator;Portable Concentrator    Sleep Oxygen Prescription Continuous    Liters per minute 2     Home Exercise Oxygen Prescription Pulsed    Liters per minute 2    Home Resting Oxygen Prescription None    Compliance with Home Oxygen Use Yes      Initial 6 min Walk   Oxygen Used Continuous    Liters per minute 2      Program Oxygen Prescription   Program Oxygen Prescription Continuous    Liters per minute 2      Intervention   Short Term   Goals To learn and exhibit compliance with exercise, home and travel O2 prescription;To learn and understand importance of monitoring SPO2 with pulse oximeter and demonstrate accurate use of the pulse oximeter.;To learn and understand importance of maintaining oxygen saturations>88%;To learn and demonstrate proper pursed lip breathing techniques or other breathing techniques. ;To learn and demonstrate proper use of respiratory medications    Long  Term Goals Exhibits compliance with exercise, home  and travel O2 prescription;Verbalizes importance of monitoring SPO2 with pulse oximeter and return demonstration;Maintenance of O2 saturations>88%;Exhibits proper breathing techniques, such as pursed lip breathing or other method taught during program session;Compliance with respiratory medication;Demonstrates proper use of MDI's             Oxygen Re-Evaluation:  Oxygen Re-Evaluation     Row Name 02/17/21 1124 03/17/21 0808           Program Oxygen Prescription   Program Oxygen Prescription Continuous Continuous      Liters per minute 2 2        Home Oxygen   Home Oxygen Device Portable Concentrator;Home Concentrator Portable Concentrator;Home Concentrator      Sleep Oxygen Prescription Continuous Continuous      Liters per minute 2 2      Home Exercise Oxygen Prescription Pulsed Pulsed      Liters per minute 2 2      Home Resting Oxygen Prescription None None      Compliance with Home Oxygen Use Yes Yes        Goals/Expected Outcomes   Short Term Goals To learn and exhibit compliance with exercise, home and travel O2 prescription;To learn  and understand importance of monitoring SPO2 with pulse oximeter and demonstrate accurate use of the pulse oximeter.;To learn and understand importance of maintaining oxygen saturations>88%;To learn and demonstrate proper pursed lip breathing techniques or other breathing techniques. ;To learn and demonstrate proper use of respiratory medications To learn and exhibit compliance with exercise, home and travel O2 prescription;To learn and understand importance of monitoring SPO2 with pulse oximeter and demonstrate accurate use of the pulse oximeter.;To learn and understand importance of maintaining oxygen saturations>88%;To learn and demonstrate proper pursed lip breathing techniques or other breathing techniques. ;To learn and demonstrate proper use of respiratory medications      Long  Term Goals Exhibits compliance with exercise, home  and travel O2 prescription;Verbalizes importance of monitoring SPO2 with pulse oximeter and return demonstration;Maintenance of O2 saturations>88%;Exhibits proper breathing techniques, such as pursed lip breathing or other method taught during program session;Compliance with respiratory medication;Demonstrates proper use of MDI's Exhibits compliance with exercise, home  and travel O2 prescription;Verbalizes importance of monitoring SPO2 with pulse oximeter and return demonstration;Maintenance of O2 saturations>88%;Exhibits proper breathing techniques, such as pursed lip breathing or other method taught during program session;Compliance with respiratory medication;Demonstrates proper use of MDI's      Goals/Expected Outcomes Compliance and understanding of monitoring oxygen saturation and importance of breathing techniques to decrease shortness of breath. Compliance and understanding of monitoring oxygen saturation and importance of breathing techniques to decrease shortness of breath.               Oxygen Discharge (Final Oxygen Re-Evaluation):  Oxygen Re-Evaluation -  03/17/21 0808       Program Oxygen Prescription   Program Oxygen Prescription Continuous    Liters per minute 2      Home Oxygen   Home Oxygen Device Portable Concentrator;Home Concentrator    Sleep Oxygen Prescription Continuous    Liters  per minute 2    Home Exercise Oxygen Prescription Pulsed    Liters per minute 2    Home Resting Oxygen Prescription None    Compliance with Home Oxygen Use Yes      Goals/Expected Outcomes   Short Term Goals To learn and exhibit compliance with exercise, home and travel O2 prescription;To learn and understand importance of monitoring SPO2 with pulse oximeter and demonstrate accurate use of the pulse oximeter.;To learn and understand importance of maintaining oxygen saturations>88%;To learn and demonstrate proper pursed lip breathing techniques or other breathing techniques. ;To learn and demonstrate proper use of respiratory medications    Long  Term Goals Exhibits compliance with exercise, home  and travel O2 prescription;Verbalizes importance of monitoring SPO2 with pulse oximeter and return demonstration;Maintenance of O2 saturations>88%;Exhibits proper breathing techniques, such as pursed lip breathing or other method taught during program session;Compliance with respiratory medication;Demonstrates proper use of MDI's    Goals/Expected Outcomes Compliance and understanding of monitoring oxygen saturation and importance of breathing techniques to decrease shortness of breath.             Initial Exercise Prescription:  Initial Exercise Prescription - 02/12/21 1100       Date of Initial Exercise RX and Referring Provider   Date 02/12/21    Referring Provider Dr. Sood    Expected Discharge Date 04/17/21      Oxygen   Oxygen Continuous    Liters 2      NuStep   Level 1    SPM 75    Minutes 30      Prescription Details   Frequency (times per week) 2    Duration Progress to 30 minutes of continuous aerobic without signs/symptoms of  physical distress      Intensity   THRR 40-80% of Max Heartrate 58-117    Ratings of Perceived Exertion 11-13    Perceived Dyspnea 0-4      Progression   Progression Continue to progress workloads to maintain intensity without signs/symptoms of physical distress.      Resistance Training   Training Prescription Yes    Weight Red bands    Reps 10-15             Perform Capillary Blood Glucose checks as needed.  Exercise Prescription Changes:   Exercise Prescription Changes     Row Name 02/18/21 1100 03/04/21 1200 03/18/21 1100         Response to Exercise   Blood Pressure (Admit) 130/70 134/70 130/60     Blood Pressure (Exercise) 100/70 -- 140/60     Blood Pressure (Exit) 122/70 110/56 110/60     Heart Rate (Admit) 104 bpm 91 bpm 106 bpm     Heart Rate (Exercise) 95 bpm 87 bpm 98 bpm     Heart Rate (Exit) 91 bpm 83 bpm 92 bpm     Oxygen Saturation (Admit) 97 % 97 % 97 %     Oxygen Saturation (Exercise) 98 % 97 % 97 %     Oxygen Saturation (Exit) 98 % 97 % 98 %     Rating of Perceived Exertion (Exercise) 13 13 13     Perceived Dyspnea (Exercise) 1 1 1     Duration Progress to 30 minutes of  aerobic without signs/symptoms of physical distress Continue with 30 min of aerobic exercise without signs/symptoms of physical distress. Continue with 30 min of aerobic exercise without signs/symptoms of physical distress.     Intensity THRR unchanged THRR   unchanged THRR unchanged       Progression   Progression Continue to progress workloads to maintain intensity without signs/symptoms of physical distress. Continue to progress workloads to maintain intensity without signs/symptoms of physical distress. Continue to progress workloads to maintain intensity without signs/symptoms of physical distress.       Resistance Training   Training Prescription -- Yes Yes     Weight Red bands Red bands --  Doing the reps, but not using the bands due to injury from fall.     Reps 10-15 10-15  10-15     Time 10 Minutes 10 Minutes 10 Minutes       Oxygen   Oxygen Continuous Continuous Continuous     Liters _0 NuStep   Level _1 SPM 40 40 60     Minutes _2 METs 1.2 1.5 1.5              Exercise Comments:   Exercise Comments     Row Name 02/18/21 1201           Exercise Comments Pt completed 1st day of exercise. She is deconditioned and exercised on the Nustep for 30 min and averaged 40 SPM at level 1. She also fell and injured her knee as this limited her ROM. She perfomed the warmup and cooldown standing with her walker in front of her for stability.                Exercise Goals and Review:   Exercise Goals     Row Name 02/12/21 1147 02/17/21 1121 03/17/21 0758         Exercise Goals   Increase Physical Activity Yes Yes Yes     Intervention Provide advice, education, support and counseling about physical activity/exercise needs.;Develop an individualized exercise prescription for aerobic and resistive training based on initial evaluation findings, risk stratification, comorbidities and participant's personal goals. Provide advice, education, support and counseling about physical activity/exercise needs.;Develop an individualized exercise prescription for aerobic and resistive training based on initial evaluation findings, risk stratification, comorbidities and participant's personal goals. Provide advice, education, support and counseling about physical activity/exercise needs.;Develop an individualized exercise prescription for aerobic and resistive training based on initial evaluation findings, risk stratification, comorbidities and participant's personal goals.     Expected Outcomes Short Term: Attend rehab on a regular basis to increase amount of physical activity.;Long Term: Add in home exercise to make exercise part of routine and to increase amount of physical activity.;Long Term: Exercising regularly at least 3-5 days a week.  Short Term: Attend rehab on a regular basis to increase amount of physical activity.;Long Term: Add in home exercise to make exercise part of routine and to increase amount of physical activity.;Long Term: Exercising regularly at least 3-5 days a week. Short Term: Attend rehab on a regular basis to increase amount of physical activity.;Long Term: Add in home exercise to make exercise part of routine and to increase amount of physical activity.;Long Term: Exercising regularly at least 3-5 days a week.     Increase Strength and Stamina Yes Yes Yes     Intervention Provide advice, education, support and counseling about physical activity/exercise needs.;Develop an individualized exercise prescription for aerobic and resistive training based on initial evaluation findings, risk stratification, comorbidities and participant's personal goals. Provide advice, education, support and counseling about physical activity/exercise needs.;Develop an individualized exercise prescription for  aerobic and resistive training based on initial evaluation findings, risk stratification, comorbidities and participant's personal goals. Provide advice, education, support and counseling about physical activity/exercise needs.;Develop an individualized exercise prescription for aerobic and resistive training based on initial evaluation findings, risk stratification, comorbidities and participant's personal goals.     Expected Outcomes Short Term: Increase workloads from initial exercise prescription for resistance, speed, and METs.;Short Term: Perform resistance training exercises routinely during rehab and add in resistance training at home;Long Term: Improve cardiorespiratory fitness, muscular endurance and strength as measured by increased METs and functional capacity (6MWT) Short Term: Increase workloads from initial exercise prescription for resistance, speed, and METs.;Short Term: Perform resistance training exercises routinely during  rehab and add in resistance training at home;Long Term: Improve cardiorespiratory fitness, muscular endurance and strength as measured by increased METs and functional capacity (6MWT) Short Term: Increase workloads from initial exercise prescription for resistance, speed, and METs.;Short Term: Perform resistance training exercises routinely during rehab and add in resistance training at home;Long Term: Improve cardiorespiratory fitness, muscular endurance and strength as measured by increased METs and functional capacity (6MWT)     Able to understand and use rate of perceived exertion (RPE) scale Yes Yes Yes     Intervention Provide education and explanation on how to use RPE scale Provide education and explanation on how to use RPE scale Provide education and explanation on how to use RPE scale     Expected Outcomes Short Term: Able to use RPE daily in rehab to express subjective intensity level;Long Term:  Able to use RPE to guide intensity level when exercising independently Short Term: Able to use RPE daily in rehab to express subjective intensity level;Long Term:  Able to use RPE to guide intensity level when exercising independently Short Term: Able to use RPE daily in rehab to express subjective intensity level;Long Term:  Able to use RPE to guide intensity level when exercising independently     Able to understand and use Dyspnea scale Yes Yes Yes     Intervention Provide education and explanation on how to use Dyspnea scale Provide education and explanation on how to use Dyspnea scale Provide education and explanation on how to use Dyspnea scale     Expected Outcomes Short Term: Able to use Dyspnea scale daily in rehab to express subjective sense of shortness of breath during exertion;Long Term: Able to use Dyspnea scale to guide intensity level when exercising independently Short Term: Able to use Dyspnea scale daily in rehab to express subjective sense of shortness of breath during exertion;Long  Term: Able to use Dyspnea scale to guide intensity level when exercising independently Short Term: Able to use Dyspnea scale daily in rehab to express subjective sense of shortness of breath during exertion;Long Term: Able to use Dyspnea scale to guide intensity level when exercising independently     Knowledge and understanding of Target Heart Rate Range (THRR) Yes Yes Yes     Intervention Provide education and explanation of THRR including how the numbers were predicted and where they are located for reference Provide education and explanation of THRR including how the numbers were predicted and where they are located for reference Provide education and explanation of THRR including how the numbers were predicted and where they are located for reference     Expected Outcomes Short Term: Able to state/look up THRR;Long Term: Able to use THRR to govern intensity when exercising independently;Short Term: Able to use daily as guideline for intensity in rehab Short Term: Able   to state/look up THRR;Long Term: Able to use THRR to govern intensity when exercising independently;Short Term: Able to use daily as guideline for intensity in rehab Short Term: Able to state/look up THRR;Long Term: Able to use THRR to govern intensity when exercising independently;Short Term: Able to use daily as guideline for intensity in rehab     Understanding of Exercise Prescription Yes Yes Yes     Intervention Provide education, explanation, and written materials on patient's individual exercise prescription Provide education, explanation, and written materials on patient's individual exercise prescription Provide education, explanation, and written materials on patient's individual exercise prescription     Expected Outcomes Short Term: Able to explain program exercise prescription;Long Term: Able to explain home exercise prescription to exercise independently Short Term: Able to explain program exercise prescription;Long Term: Able  to explain home exercise prescription to exercise independently Short Term: Able to explain program exercise prescription;Long Term: Able to explain home exercise prescription to exercise independently              Exercise Goals Re-Evaluation :  Exercise Goals Re-Evaluation     Row Name 02/12/21 1148 02/17/21 1122 03/17/21 0759         Exercise Goal Re-Evaluation   Exercise Goals Review Increase Physical Activity;Increase Strength and Stamina;Able to understand and use rate of perceived exertion (RPE) scale;Able to understand and use Dyspnea scale;Knowledge and understanding of Target Heart Rate Range (THRR);Understanding of Exercise Prescription Increase Physical Activity;Increase Strength and Stamina;Able to understand and use rate of perceived exertion (RPE) scale;Able to understand and use Dyspnea scale;Knowledge and understanding of Target Heart Rate Range (THRR);Understanding of Exercise Prescription Increase Physical Activity;Increase Strength and Stamina;Able to understand and use rate of perceived exertion (RPE) scale;Able to understand and use Dyspnea scale;Knowledge and understanding of Target Heart Rate Range (THRR);Understanding of Exercise Prescription     Comments Pt is scheduled to start exercise next week. We will monitor and progress as she is able. Pt is scheduled to start exercise this week. We will monitor and progress as she is able. Virginia Burns has completed 5 exercise sessions. She exercise for 30 min on the Nustep at level 1 averaging 1.5 METs. Virginia Burns arrives to the Pulmonary Rehab ambulatory with a walker. She has mentioned to staff that her goal is to ambulated with a cane. Virginia Burns performs the warm up and cool down standing/sitting dependent on her SOB and physical abilities. Virginia Burns has decreased balance and coordination, which contributes to sitting down during warm up and cool down. She tolerates exercise fair and is slow to progress. Virginia Burns has increased her METS by  0.3 since starting Pulmonary Rehab. Will continue to monitor and progress as able.     Expected Outcomes Through exercise at rehab and home, the patient will decrease shortness of breath with daily activities and feel confident in carrying out an exercise regimn at home. Through exercise at rehab and home, the patient will decrease shortness of breath with daily activities and feel confident in carrying out an exercise regimn at home. Through exercise at rehab and home, the patient will decrease shortness of breath with daily activities and feel confident in carrying out an exercise regimn at home.              Discharge Exercise Prescription (Final Exercise Prescription Changes):  Exercise Prescription Changes - 03/18/21 1100       Response to Exercise   Blood Pressure (Admit) 130/60    Blood Pressure (Exercise) 140/60    Blood Pressure (Exit)  110/60    Heart Rate (Admit) 106 bpm    Heart Rate (Exercise) 98 bpm    Heart Rate (Exit) 92 bpm    Oxygen Saturation (Admit) 97 %    Oxygen Saturation (Exercise) 97 %    Oxygen Saturation (Exit) 98 %    Rating of Perceived Exertion (Exercise) 13    Perceived Dyspnea (Exercise) 1    Duration Continue with 30 min of aerobic exercise without signs/symptoms of physical distress.    Intensity THRR unchanged      Progression   Progression Continue to progress workloads to maintain intensity without signs/symptoms of physical distress.      Resistance Training   Training Prescription Yes    Weight --   Doing the reps, but not using the bands due to injury from fall.   Reps 10-15    Time 10 Minutes      Oxygen   Oxygen Continuous    Liters 2      NuStep   Level 2    SPM 60    Minutes 30    METs 1.5             Nutrition:  Target Goals: Understanding of nutrition guidelines, daily intake of sodium <1530m, cholesterol <2079m calories 30% from fat and 7% or less from saturated fats, daily to have 5 or more servings of fruits and  vegetables.  Biometrics:  Pre Biometrics - 02/12/21 1059       Pre Biometrics   Height 5' 5" (1.651 m)    Weight 77.6 kg    BMI (Calculated) 28.47    Grip Strength 15 kg              Nutrition Therapy Plan and Nutrition Goals:   Nutrition Assessments:  MEDIFICTS Score Key: ?70 Need to make dietary changes  40-70 Heart Healthy Diet ? 40 Therapeutic Level Cholesterol Diet   Picture Your Plate Scores: <4<99nhealthy dietary pattern with much room for improvement. 41-50 Dietary pattern unlikely to meet recommendations for good health and room for improvement. 51-60 More healthful dietary pattern, with some room for improvement.  >60 Healthy dietary pattern, although there may be some specific behaviors that could be improved.    Nutrition Goals Re-Evaluation:   Nutrition Goals Discharge (Final Nutrition Goals Re-Evaluation):   Psychosocial: Target Goals: Acknowledge presence or absence of significant depression and/or stress, maximize coping skills, provide positive support system. Participant is able to verbalize types and ability to use techniques and skills needed for reducing stress and depression.  Initial Review & Psychosocial Screening:  Initial Psych Review & Screening - 02/12/21 1105       Initial Review   Current issues with None Identified      Family Dynamics   Good Support System? Yes   Son and some good friends   Comments Good support system and no psychsocial concerns      Barriers   Psychosocial barriers to participate in program There are no identifiable barriers or psychosocial needs.      Screening Interventions   Interventions Encouraged to exercise             Quality of Life Scores:  Scores of 19 and below usually indicate a poorer quality of life in these areas.  A difference of  2-3 points is a clinically meaningful difference.  A difference of 2-3 points in the total score of the Quality of Life Index has been associated with  significant improvement in overall  quality of life, self-image, physical symptoms, and general health in studies assessing change in quality of life.  PHQ-9: Recent Review Flowsheet Data     Depression screen PHQ 2/9 02/12/2021 09/02/2020   Decreased Interest 0 0   Down, Depressed, Hopeless 0 0   PHQ - 2 Score 0 0   Altered sleeping 0 0   Tired, decreased energy 0 0   Change in appetite 0 0   Feeling bad or failure about yourself  0 0   Trouble concentrating 0 0   Moving slowly or fidgety/restless 0 0   Suicidal thoughts 0 0   PHQ-9 Score 0 0   Difficult doing work/chores Not difficult at all Not difficult at all      Interpretation of Total Score  Total Score Depression Severity:  1-4 = Minimal depression, 5-9 = Mild depression, 10-14 = Moderate depression, 15-19 = Moderately severe depression, 20-27 = Severe depression   Psychosocial Evaluation and Intervention:  Psychosocial Evaluation - 02/12/21 1115       Psychosocial Evaluation & Interventions   Interventions Encouraged to exercise with the program and follow exercise prescription    Comments No psychosocial needs identified    Expected Outcomes For pt to continue to have no psychosocial concerns or barriers    Continue Psychosocial Services  No Follow up required             Psychosocial Re-Evaluation:  Psychosocial Re-Evaluation     Row Name 02/17/21 0958 02/17/21 1000 03/18/21 0909         Psychosocial Re-Evaluation   Current issues with None Identified None Identified None Identified     Comments No psychosocial concerns or barriers were identified. Virginia Burns has a supportive family and friends that assist her with transportation and supplies she needs.  Virginia Burns is very positive and shows no signs of depression and has a positive attitude. Virginia Burns has a large support group, her son and many friends drive her to appointments.  She fell recently at home and her family have taken care of her and had her evaluated  by her PCP.  Virginia Burns has no barriers or psychosocial concerns for participation in pulmonary rehab. She has transportation, food, housing, and has a positive attitude.  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     Expected Outcomes For Virginia Burns to continue to have no psychosocial concerns while participating in pulmonary rehab. For Virginia Burns to continue to be without psychosocial concerns while participating in pulmonary rehab. --     Interventions Encouraged to attend Pulmonary Rehabilitation for the exercise Encouraged to attend Pulmonary Rehabilitation for the exercise Encouraged to attend Pulmonary Rehabilitation for the exercise     Continue Psychosocial Services  No Follow up required No Follow up required No Follow up required              Psychosocial Discharge (Final Psychosocial Re-Evaluation):  Psychosocial Re-Evaluation - 03/18/21 0909       Psychosocial Re-Evaluation   Current issues with None Identified    Comments Virginia Burns has a large support group, her son and many friends drive her to appointments.  She fell recently at home and her family have taken care of her and had her evaluated by her PCP.  Virginia Burns has no barriers or psychosocial concerns for participation in pulmonary rehab. She has transportation, food, housing, and has a positive attitude.  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    Interventions   Encouraged to attend Pulmonary Rehabilitation for the exercise    Continue Psychosocial Services  No Follow up required             Education: Education Goals: Education classes will be provided on a weekly basis, covering required topics. Participant will state understanding/return demonstration of topics presented.  Learning Barriers/Preferences:  Learning Barriers/Preferences - 02/12/21 1132       Learning Barriers/Preferences   Learning Barriers None    Learning Preferences Computer/Internet;Individual  Instruction;Group Instruction;Verbal Instruction;None;Video             Education Topics: Risk Factor Reduction:  -Group instruction that is supported by a PowerPoint presentation. Instructor discusses the definition of a risk factor, different risk factors for pulmonary disease, and how the heart and lungs work together.     Nutrition for Pulmonary Patient:  -Group instruction provided by PowerPoint slides, verbal discussion, and written materials to support subject matter. The instructor gives an explanation and review of healthy diet recommendations, which includes a discussion on weight management, recommendations for fruit and vegetable consumption, as well as protein, fluid, caffeine, fiber, sodium, sugar, and alcohol. Tips for eating when patients are short of breath are discussed.   Pursed Lip Breathing:  -Group instruction that is supported by demonstration and informational handouts. Instructor discusses the benefits of pursed lip and diaphragmatic breathing and detailed demonstration on how to preform both.     Oxygen Safety:  -Group instruction provided by PowerPoint, verbal discussion, and written material to support subject matter. There is an overview of "What is Oxygen" and "Why do we need it".  Instructor also reviews how to create a safe environment for oxygen use, the importance of using oxygen as prescribed, and the risks of noncompliance. There is a brief discussion on traveling with oxygen and resources the patient may utilize.   Oxygen Equipment:  -Group instruction provided by Home Health Staff utilizing handouts, written materials, and equipment demonstrations.   Signs and Symptoms:  -Group instruction provided by written material and verbal discussion to support subject matter. Warning signs and symptoms of infection, stroke, and heart attack are reviewed and when to call the physician/911 reinforced. Tips for preventing the spread of infection  discussed.   Advanced Directives:  -Group instruction provided by verbal instruction and written material to support subject matter. Instructor reviews Advanced Directive laws and proper instruction for filling out document.   Pulmonary Video:  -Group video education that reviews the importance of medication and oxygen compliance, exercise, good nutrition, pulmonary hygiene, and pursed lip and diaphragmatic breathing for the pulmonary patient.   Exercise for the Pulmonary Patient:  -Group instruction that is supported by a PowerPoint presentation. Instructor discusses benefits of exercise, core components of exercise, frequency, duration, and intensity of an exercise routine, importance of utilizing pulse oximetry during exercise, safety while exercising, and options of places to exercise outside of rehab.     Pulmonary Medications:  -Verbally interactive group education provided by instructor with focus on inhaled medications and proper administration.   Anatomy and Physiology of the Respiratory System and Intimacy:  -Group instruction provided by PowerPoint, verbal discussion, and written material to support subject matter. Instructor reviews respiratory cycle and anatomical components of the respiratory system and their functions. Instructor also reviews differences in obstructive and restrictive respiratory diseases with examples of each. Intimacy, Sex, and Sexuality differences are reviewed with a discussion on how relationships can change when diagnosed with pulmonary disease. Common sexual concerns are reviewed. Flowsheet Row PULMONARY REHAB   CHRONIC OBSTRUCTIVE PULMONARY DISEASE from 03/20/2021 in [facility]  Date 03/20/21  Educator Kaylee  Instruction Review Code 1- Verbalizes Understanding       MD DAY -A group question and answer session with a medical doctor that allows participants to ask questions that relate to their pulmonary disease  state.   OTHER EDUCATION -Group or individual verbal, written, or video instructions that support the educational goals of the pulmonary rehab program. Flowsheet Row PULMONARY REHAB CHRONIC OBSTRUCTIVE PULMONARY DISEASE from 03/20/2021 in [facility]  Date 02/27/21  Educator handout  [Met levels]       Holiday Eating Survival Tips:  -Group instruction provided by PowerPoint slides, verbal discussion, and written materials to support subject matter. The instructor gives patients tips, tricks, and techniques to help them not only survive but enjoy the holidays despite the onslaught of food that accompanies the holidays.   Knowledge Questionnaire Score:  Knowledge Questionnaire Score - 02/12/21 1125       Knowledge Questionnaire Score   Pre Score 17/18             Core Components/Risk Factors/Patient Goals at Admission:  Personal Goals and Risk Factors at Admission - 02/12/21 1118       Core Components/Risk Factors/Patient Goals on Admission   Improve shortness of breath with ADL's Yes    Intervention Provide education, individualized exercise plan and daily activity instruction to help decrease symptoms of SOB with activities of daily living.    Expected Outcomes Short Term: Improve cardiorespiratory fitness to achieve a reduction of symptoms when performing ADLs;Long Term: Be able to perform more ADLs without symptoms or delay the onset of symptoms             Core Components/Risk Factors/Patient Goals Review:   Goals and Risk Factor Review     Row Name 02/12/21 1121 02/17/21 1000 02/17/21 1004 03/18/21 0926       Core Components/Risk Factors/Patient Goals Review   Personal Goals Review Develop more efficient breathing techniques such as purse lipped breathing and diaphragmatic breathing and practicing self-pacing with activity.;Increase knowledge of respiratory medications and ability to use respiratory devices properly.;Improve  shortness of breath with ADL's Increase knowledge of respiratory medications and ability to use respiratory devices properly. Increase knowledge of respiratory medications and ability to use respiratory devices properly.;Improve shortness of breath with ADL's;Develop more efficient breathing techniques such as purse lipped breathing and diaphragmatic breathing and practicing self-pacing with activity. Increase knowledge of respiratory medications and ability to use respiratory devices properly.;Improve shortness of breath with ADL's;Develop more efficient breathing techniques such as purse lipped breathing and diaphragmatic breathing and practicing self-pacing with activity.    Review -- -- Virginia Burns will begin exercising in pulmonary rehab 02/18/2021 so it is too soon for her to meet any program or personal goals. Virginia Burns has been slow to progress with her exercise workloads due to a recent fall.  She is exercising at 1.5 mets on the nustep for 30 minutes.  She is very weak and deconditioned.    Expected Outcomes -- -- See admission goals. See admission goals.             Core Components/Risk Factors/Patient Goals at Discharge (Final Review):   Goals and Risk Factor Review - 03/18/21 0926       Core Components/Risk Factors/Patient Goals Review   Personal Goals Review Increase knowledge of respiratory medications and ability to use respiratory devices properly.;Improve shortness of breath with   ADL's;Develop more efficient breathing techniques such as purse lipped breathing and diaphragmatic breathing and practicing self-pacing with activity.    Review Virginia Burns has been slow to progress with her exercise workloads due to a recent fall.  She is exercising at 1.5 mets on the nustep for 30 minutes.  She is very weak and deconditioned.    Expected Outcomes See admission goals.             ITP Comments:   Comments: ITP REVIEW Pt is making expected progress toward pulmonary rehab goals after  completing 8 sessions. Recommend continued exercise, life style modification, education, and utilization of breathing techniques to increase stamina and strength and decrease shortness of breath with exertion.

## 2021-03-25 ENCOUNTER — Encounter (HOSPITAL_COMMUNITY)
Admission: RE | Admit: 2021-03-25 | Discharge: 2021-03-25 | Disposition: A | Discharge status: Home / Self Care | Payer: PPO | Source: Ambulatory Visit | Attending: Pulmonary Disease | Admitting: Pulmonary Disease

## 2021-03-25 ENCOUNTER — Ambulatory Visit (HOSPITAL_COMMUNITY): Payer: PPO

## 2021-03-25 ENCOUNTER — Other Ambulatory Visit: Payer: Self-pay

## 2021-03-25 DIAGNOSIS — J449 Chronic obstructive pulmonary disease, unspecified: Secondary | ICD-10-CM

## 2021-03-25 NOTE — Progress Notes (Signed)
Daily Session Note  Patient Details  Name: Virginia Burns MRN: [id number] Date of Birth: 1945-09-06 Referring Provider:   April Manson Pulmonary Rehab Walk Test from 02/12/2021 in [facility]  Referring Provider Dr. Halford Chessman       Encounter Date: 03/25/2021  Check In:  Session Check In - 03/25/21 1116       Check-In   Supervising physician immediately available to respond to emergencies [facility] immediately available    Physician(s) Dr. Pietro Cassis    Location [facility]    Staff Present Rosebud Poles, RN, Quentin Ore, MS, ACSM-CEP, Exercise Physiologist;Lisa Ysidro Evert, RN    Virtual Visit No    Medication changes reported     No    Fall or balance concerns reported    No    Tobacco Cessation No Change    Warm-up and Cool-down Performed as group-led instruction    Resistance Training Performed Yes    VAD Patient? No    PAD/SET Patient? No      Pain Assessment   Currently in Pain? No/denies    Multiple Pain Sites No             Capillary Blood Glucose: No results found for this or any previous visit (from the past 24 hour(s)).    Social History   Tobacco Use  Smoking Status Former   Packs/day: 1.00   Years: 25.00   Pack years: 25.00   Types: Cigarettes   Quit date: 05/18/2009   Years since quitting: 11.8  Smokeless Tobacco Never    Goals Met:  Proper associated with RPD/PD & O2 Sat Exercise tolerated well No report of concerns or symptoms today Strength training completed today  Goals Unmet:  Not Applicable  Comments: Service time is from 1024 to 1132.    Dr. Rodman Pickle is Medical Director for Pulmonary Rehab at [facility].

## 2021-03-27 ENCOUNTER — Ambulatory Visit (HOSPITAL_COMMUNITY): Payer: PPO

## 2021-03-27 ENCOUNTER — Encounter (HOSPITAL_COMMUNITY)
Admission: RE | Admit: 2021-03-27 | Discharge: 2021-03-27 | Disposition: A | Discharge status: Home / Self Care | Payer: PPO | Source: Ambulatory Visit | Attending: Pulmonary Disease | Admitting: Pulmonary Disease

## 2021-03-27 ENCOUNTER — Other Ambulatory Visit: Payer: Self-pay

## 2021-03-27 DIAGNOSIS — J449 Chronic obstructive pulmonary disease, unspecified: Secondary | ICD-10-CM

## 2021-03-27 DIAGNOSIS — R0602 Shortness of breath: Secondary | ICD-10-CM

## 2021-03-27 NOTE — Progress Notes (Signed)
Daily Session Note  Patient Details  Name: Virginia Burns MRN: [id number] Date of Birth: 10/17/1945 Referring Provider:   Flowsheet Row Pulmonary Rehab Walk Test from 02/12/2021 in [facility]  Referring Provider Dr. Sood       Encounter Date: 03/27/2021  Check In:  Session Check In - 03/27/21 1117       Check-In   Supervising physician immediately available to respond to emergencies [facility] immediately available    Physician(s) Dr. Joseph    Location [facility]    Staff Present Joan Behrens, RN, BSN;Kaylee Davis, MS, ACSM-CEP, Exercise Physiologist;Lisa Hughes, RN    Virtual Visit No    Medication changes reported     No    Fall or balance concerns reported    No    Tobacco Cessation No Change    Warm-up and Cool-down Performed as group-led instruction    Resistance Training Performed Yes    VAD Patient? No    PAD/SET Patient? No      Pain Assessment   Currently in Pain? No/denies    Multiple Pain Sites No             Capillary Blood Glucose: No results found for this or any previous visit (from the past 24 hour(s)).    Social History   Tobacco Use  Smoking Status Former   Packs/day: 1.00   Years: 25.00   Pack years: 25.00   Types: Cigarettes   Quit date: 05/18/2009   Years since quitting: 11.8  Smokeless Tobacco Never    Goals Met:  Exercise tolerated well No report of concerns or symptoms today Strength training completed today  Goals Unmet:  Not Applicable  Comments: Service time is from 1020 to 1125     Dr. Jane Ellison is Medical Director for Pulmonary Rehab at [facility].  

## 2021-04-01 ENCOUNTER — Ambulatory Visit (HOSPITAL_COMMUNITY): Payer: PPO

## 2021-04-01 ENCOUNTER — Other Ambulatory Visit: Payer: Self-pay

## 2021-04-01 ENCOUNTER — Encounter (HOSPITAL_COMMUNITY)
Admission: RE | Admit: 2021-04-01 | Discharge: 2021-04-01 | Disposition: A | Discharge status: Home / Self Care | Payer: PPO | Source: Ambulatory Visit | Attending: Pulmonary Disease | Admitting: Pulmonary Disease

## 2021-04-01 VITALS — Wt 171.3 lb

## 2021-04-01 DIAGNOSIS — J449 Chronic obstructive pulmonary disease, unspecified: Secondary | ICD-10-CM

## 2021-04-01 DIAGNOSIS — E041 Nontoxic single thyroid nodule: Secondary | ICD-10-CM | POA: Diagnosis not present

## 2021-04-01 DIAGNOSIS — Z136 Encounter for screening for cardiovascular disorders: Secondary | ICD-10-CM | POA: Diagnosis not present

## 2021-04-01 DIAGNOSIS — Z0000 Encounter for general adult medical examination without abnormal findings: Secondary | ICD-10-CM | POA: Diagnosis not present

## 2021-04-01 NOTE — Progress Notes (Signed)
Home Exercise Prescription I have reviewed a Home Exercise Prescription with Virginia Burns. Virginia Burns is currently exercising at home. Virginia Burns is doing PT exercises 4-5 non-rehab days/wk.The patient was advised to purchase a seated pedaling machine or find seated chair aerobic exercise videos. She can do either 1-2  non-rehab days/wk for 2x15  or 30 min/day. Virginia Burns and I discussed how to progress their exercise prescription. I encouraged Virginia Burns to try to find ways to keep moving so that her functional capacity doesn't decrease.  Virginia Burns stated that they understand the exercise prescription.  The patient stated that their goals were to drive a car again. We reviewed exercise guidelines, target heart rate during exercise, RPE Scale, weather conditions, endpoints for exercise, warmup and cool down. The patient is encouraged to come to me with any questions. I will continue to follow up with the patient to assist them with progression and safety.    Sheppard Plumber, MS, ACSM-CEP 04/01/2021 3:15 PM

## 2021-04-01 NOTE — Progress Notes (Signed)
Daily Session Note  Patient Details  Name: Virginia Burns MRN: [id number] Date of Birth: 1945-07-13 Referring Provider:   April Manson Pulmonary Rehab Walk Test from 02/12/2021 in [place]  Referring Provider Dr. Halford Chessman       Encounter Date: 04/01/2021  Check In:  Session Check In - 04/01/21 1121       Check-In   Supervising physician immediately available to respond to emergencies [facility] immediately available    Physician(s) Dr.Joseph    Staff Present Rosebud Poles, RN, Quentin Ore, MS, ACSM-CEP, Exercise Physiologist;Lisa Ysidro Evert, RN    Virtual Visit No    Medication changes reported     No    Fall or balance concerns reported    No    Tobacco Cessation No Change    Warm-up and Cool-down Performed as group-led instruction    Resistance Training Performed Yes    VAD Patient? No    PAD/SET Patient? No      Pain Assessment   Currently in Pain? No/denies    Multiple Pain Sites No             Capillary Blood Glucose: No results found for this or any previous visit (from the past 24 hour(s)).   Exercise Prescription Changes - 04/01/21 1100       Response to Exercise   Blood Pressure (Admit) 132/60    Blood Pressure (Exercise) 124/72    Blood Pressure (Exit) 126/68    Heart Rate (Admit) 95 bpm    Heart Rate (Exercise) 87 bpm    Heart Rate (Exit) 90 bpm    Oxygen Saturation (Admit) 94 %    Oxygen Saturation (Exercise) 97 %    Oxygen Saturation (Exit) 97 %    Rating of Perceived Exertion (Exercise) 12    Perceived Dyspnea (Exercise) 1    Duration Continue with 30 min of aerobic exercise without signs/symptoms of physical distress.    Intensity THRR unchanged      Progression   Progression Continue to progress workloads to maintain intensity without signs/symptoms of physical distress.      Resistance Training   Training Prescription Yes    Weight Red bands    Reps 10-15    Time 10 Minutes      Oxygen    Oxygen Continuous    Liters 2      NuStep   Level 3    SPM 60    Minutes 30    METs 1.4             Social History   Tobacco Use  Smoking Status Former   Packs/day: 1.00   Years: 25.00   Pack years: 25.00   Types: Cigarettes   Quit date: 05/18/2009   Years since quitting: 11.8  Smokeless Tobacco Never    Goals Met:  Proper associated with RPD/PD & O2 Sat Exercise tolerated well No report of concerns or symptoms today Strength training completed today  Goals Unmet:  Not Applicable  Comments: Service time is from 1023 to 1130.    Dr. Rodman Pickle is Medical Director for Pulmonary Rehab at [facility].

## 2021-04-01 NOTE — Progress Notes (Deleted)
Daily Session Note  Patient Details  Name: Virginia Burns MRN: [id number] Date of Birth: [birth year] Referring Provider:   April Manson Pulmonary Rehab Walk Test from 02/12/2021 in [place]  Referring Provider Dr. Halford Chessman       Encounter Date: 04/01/2021  Check In:  Session Check In - 04/01/21 1121       Check-In   Supervising physician immediately available to respond to emergencies [facility] immediately available    Physician(s) Dr.Joseph    Staff Present Rosebud Poles, RN, Quentin Ore, MS, ACSM-CEP, Exercise Physiologist;Lisa Ysidro Evert, RN    Virtual Visit No    Medication changes reported     No    Fall or balance concerns reported    No    Tobacco Cessation No Change    Warm-up and Cool-down Performed as group-led instruction    Resistance Training Performed Yes    VAD Patient? No    PAD/SET Patient? No      Pain Assessment   Currently in Pain? No/denies    Multiple Pain Sites No             Capillary Blood Glucose: No results found for this or any previous visit (from the past 24 hour(s)).    Social History   Tobacco Use  Smoking Status Former   Packs/day: 1.00   Years: 25.00   Pack years: 25.00   Types: Cigarettes   Quit date: 05/18/2009   Years since quitting: 11.8  Smokeless Tobacco Never    Goals Met:  Proper associated with RPD/PD & O2 Sat Exercise tolerated well No report of concerns or symptoms today Strength training completed today  Goals Unmet:  Not Applicable  Comments: Service time is from 1023 to 1130.    Dr. Rodman Pickle is Medical Director for Pulmonary Rehab at [facility].

## 2021-04-03 ENCOUNTER — Other Ambulatory Visit: Payer: Self-pay

## 2021-04-03 ENCOUNTER — Ambulatory Visit (HOSPITAL_COMMUNITY): Payer: PPO

## 2021-04-03 ENCOUNTER — Encounter (HOSPITAL_COMMUNITY)
Admission: RE | Admit: 2021-04-03 | Discharge: 2021-04-03 | Disposition: A | Discharge status: Home / Self Care | Payer: PPO | Source: Ambulatory Visit | Attending: Pulmonary Disease | Admitting: Pulmonary Disease

## 2021-04-03 DIAGNOSIS — J449 Chronic obstructive pulmonary disease, unspecified: Secondary | ICD-10-CM | POA: Diagnosis not present

## 2021-04-03 DIAGNOSIS — R0602 Shortness of breath: Secondary | ICD-10-CM

## 2021-04-03 NOTE — Progress Notes (Signed)
Daily Session Note  Patient Details  Name: Virginia Burns MRN: [id number] Date of Birth: 29-Oct-1945 Referring Provider:   April Manson Pulmonary Rehab Walk Test from 02/12/2021 in [place]  Referring Provider Dr. Halford Chessman       Encounter Date: 04/03/2021  Check In:  Session Check In - 04/03/21 1130       Check-In   Supervising physician immediately available to respond to emergencies [facility] immediately available    Physician(s) Dr. Cathlean Sauer    Location [facility]    Staff Present Rosebud Poles, RN, Quentin Ore, MS, ACSM-CEP, Exercise Physiologist;Tedra Coppernoll Ysidro Evert, RN    Virtual Visit No    Medication changes reported     No    Fall or balance concerns reported    No    Tobacco Cessation No Change    Warm-up and Cool-down Performed as group-led instruction    Resistance Training Performed Yes    VAD Patient? No    PAD/SET Patient? No      Pain Assessment   Currently in Pain? No/denies    Multiple Pain Sites No             Capillary Blood Glucose: No results found for this or any previous visit (from the past 24 hour(s)).    Social History   Tobacco Use  Smoking Status Former   Packs/day: 1.00   Years: 25.00   Pack years: 25.00   Types: Cigarettes   Quit date: 05/18/2009   Years since quitting: 11.8  Smokeless Tobacco Never    Goals Met:  Exercise tolerated well No report of concerns or symptoms today Strength training completed today  Goals Unmet:  Not Applicable  Comments: Service time is from 1030 to 1120    Dr. Rodman Pickle is Medical Director for Pulmonary Rehab at [facility].

## 2021-04-08 ENCOUNTER — Other Ambulatory Visit: Payer: Self-pay

## 2021-04-08 ENCOUNTER — Encounter (HOSPITAL_COMMUNITY)
Admission: RE | Admit: 2021-04-08 | Discharge: 2021-04-08 | Disposition: A | Discharge status: Home / Self Care | Payer: PPO | Source: Ambulatory Visit | Attending: Pulmonary Disease | Admitting: Pulmonary Disease

## 2021-04-08 ENCOUNTER — Ambulatory Visit (HOSPITAL_COMMUNITY): Payer: PPO

## 2021-04-08 DIAGNOSIS — J449 Chronic obstructive pulmonary disease, unspecified: Secondary | ICD-10-CM

## 2021-04-08 NOTE — Progress Notes (Signed)
Daily Session Note  Patient Details  Name: Virginia Burns MRN: [id number] Date of Birth: 1946-03-18 Referring Provider:   April Manson Pulmonary Rehab Walk Test from 02/12/2021 in [place]  Referring Provider Dr. Halford Chessman       Encounter Date: 04/08/2021  Check In:  Session Check In - 04/08/21 1115       Check-In   Supervising physician immediately available to respond to emergencies [facility] immediately available    Physician(s) Dr. Pietro Cassis    Location [facility]    Staff Present Rosebud Poles, RN, Quentin Ore, MS, ACSM-CEP, Exercise Physiologist;Malesha Suliman Ysidro Evert, RN    Virtual Visit No    Medication changes reported     No    Fall or balance concerns reported    No    Tobacco Cessation No Change    Warm-up and Cool-down Performed as group-led instruction    Resistance Training Performed Yes    VAD Patient? No    PAD/SET Patient? No      Pain Assessment   Currently in Pain? No/denies    Multiple Pain Sites No             Capillary Blood Glucose: No results found for this or any previous visit (from the past 24 hour(s)).    Social History   Tobacco Use  Smoking Status Former   Packs/day: 1.00   Years: 25.00   Pack years: 25.00   Types: Cigarettes   Quit date: 05/18/2009   Years since quitting: 11.8  Smokeless Tobacco Never    Goals Met:  Exercise tolerated well No report of concerns or symptoms today Strength training completed today  Goals Unmet:  Not Applicable  Comments: Service time is from 1015 to 1130    Dr. Rodman Pickle is Medical Director for Pulmonary Rehab at [facility].

## 2021-04-09 DIAGNOSIS — L089 Local infection of the skin and subcutaneous tissue, unspecified: Secondary | ICD-10-CM | POA: Diagnosis not present

## 2021-04-09 DIAGNOSIS — R300 Dysuria: Secondary | ICD-10-CM | POA: Diagnosis not present

## 2021-04-15 ENCOUNTER — Ambulatory Visit (HOSPITAL_COMMUNITY): Payer: PPO

## 2021-04-15 ENCOUNTER — Encounter (HOSPITAL_COMMUNITY)
Admission: RE | Admit: 2021-04-15 | Discharge: 2021-04-15 | Disposition: A | Discharge status: Home / Self Care | Payer: PPO | Source: Ambulatory Visit | Attending: Pulmonary Disease | Admitting: Pulmonary Disease

## 2021-04-15 ENCOUNTER — Other Ambulatory Visit: Payer: Self-pay

## 2021-04-15 VITALS — Wt 169.8 lb

## 2021-04-15 DIAGNOSIS — J449 Chronic obstructive pulmonary disease, unspecified: Secondary | ICD-10-CM

## 2021-04-15 DIAGNOSIS — R0602 Shortness of breath: Secondary | ICD-10-CM

## 2021-04-15 NOTE — Progress Notes (Signed)
Daily Session Note  Patient Details  Name: Virginia Burns MRN: [id number] Date of Birth: 1945-06-21 Referring Provider:   April Manson Pulmonary Rehab Walk Test from 02/12/2021 in [place]  Referring Provider Dr. Halford Chessman       Encounter Date: 04/15/2021  Check In:  Session Check In - 04/15/21 1118       Check-In   Supervising physician immediately available to respond to emergencies [facility] immediately available    Physician(s) Dr. Broadus John    Location [facility]    Staff Present Rosebud Poles, RN, Quentin Ore, MS, ACSM-CEP, Exercise Physiologist;Lisa Ysidro Evert, RN    Virtual Visit No    Medication changes reported     No    Fall or balance concerns reported    No    Tobacco Cessation No Change    Warm-up and Cool-down Performed as group-led instruction    Resistance Training Performed Yes    VAD Patient? No    PAD/SET Patient? No      Pain Assessment   Currently in Pain? No/denies    Multiple Pain Sites No             Capillary Blood Glucose: No results found for this or any previous visit (from the past 24 hour(s)).   Exercise Prescription Changes - 04/15/21 1200       Response to Exercise   Blood Pressure (Admit) 152/82    Blood Pressure (Exercise) 136/80    Blood Pressure (Exit) 142/64    Heart Rate (Admit) 97 bpm    Heart Rate (Exercise) 94 bpm    Heart Rate (Exit) 91 bpm    Oxygen Saturation (Admit) 98 %    Oxygen Saturation (Exercise) 98 %    Oxygen Saturation (Exit) 97 %    Rating of Perceived Exertion (Exercise) 12    Perceived Dyspnea (Exercise) 2    Duration Continue with 30 min of aerobic exercise without signs/symptoms of physical distress.    Intensity THRR unchanged      Progression   Progression Continue to progress workloads to maintain intensity without signs/symptoms of physical distress.      Resistance Training   Training Prescription Yes    Weight Red bands    Reps  10-15    Time 10 Minutes      Oxygen   Oxygen Continuous    Liters 2      NuStep   Level 4    SPM 60    Minutes 30    METs 1.4             Social History   Tobacco Use  Smoking Status Former   Packs/day: 1.00   Years: 25.00   Pack years: 25.00   Types: Cigarettes   Quit date: 05/18/2009   Years since quitting: 11.9  Smokeless Tobacco Never    Goals Met:  Proper associated with RPD/PD & O2 Sat Using PLB without cueing & demonstrates good technique Exercise tolerated well No report of concerns or symptoms today Strength training completed today  Goals Unmet:  Not Applicable  Comments: Service time is from 1020 to 1130.    Dr. Rodman Pickle is Medical Director for Pulmonary Rehab at [facility].

## 2021-04-17 ENCOUNTER — Encounter (HOSPITAL_COMMUNITY)
Admission: RE | Admit: 2021-04-17 | Discharge: 2021-04-17 | Disposition: A | Discharge status: Home / Self Care | Payer: PPO | Source: Ambulatory Visit | Attending: Pulmonary Disease | Admitting: Pulmonary Disease

## 2021-04-17 ENCOUNTER — Ambulatory Visit (HOSPITAL_COMMUNITY): Payer: PPO

## 2021-04-17 ENCOUNTER — Other Ambulatory Visit: Payer: Self-pay

## 2021-04-17 VITALS — Wt 169.5 lb

## 2021-04-17 DIAGNOSIS — J449 Chronic obstructive pulmonary disease, unspecified: Secondary | ICD-10-CM | POA: Insufficient documentation

## 2021-04-17 DIAGNOSIS — R0602 Shortness of breath: Secondary | ICD-10-CM | POA: Diagnosis present

## 2021-04-17 NOTE — Progress Notes (Signed)
Daily Session Note  Patient Details  Name: Virginia Burns MRN: [id number] Date of Birth: 1946/04/04 Referring Provider:   April Manson Pulmonary Rehab Walk Test from 02/12/2021 in [place]  Referring Provider Dr. Halford Chessman       Encounter Date: 04/17/2021  Check In:  Session Check In - 04/17/21 1035       Check-In   Supervising physician immediately available to respond to emergencies [facility] immediately available    Physician(s) Dr. Starla Link    Location [facility]    Staff Present Rosebud Poles, RN, BSN;Lisa Ysidro Evert, Cathleen Fears, MS, ACSM-CEP, Exercise Physiologist    Virtual Visit No    Medication changes reported     No    Fall or balance concerns reported    No    Tobacco Cessation No Change    Warm-up and Cool-down Performed as group-led instruction    Resistance Training Performed Yes    VAD Patient? No    PAD/SET Patient? No      Pain Assessment   Currently in Pain? No/denies    Multiple Pain Sites No             Capillary Blood Glucose: No results found for this or any previous visit (from the past 24 hour(s)).    Social History   Tobacco Use  Smoking Status Former   Packs/day: 1.00   Years: 25.00   Pack years: 25.00   Types: Cigarettes   Quit date: 05/18/2009   Years since quitting: 11.9  Smokeless Tobacco Never    Goals Met:  Exercise tolerated well No report of concerns or symptoms today Strength training completed today  Goals Unmet:  Not Applicable  Comments: Service time is from 1022 to 1114. Completed 6 MWT.    Dr. Rodman Pickle is Medical Director for Pulmonary Rehab at [facility].

## 2021-04-21 DIAGNOSIS — Z1212 Encounter for screening for malignant neoplasm of rectum: Secondary | ICD-10-CM | POA: Diagnosis not present

## 2021-04-21 DIAGNOSIS — Z1211 Encounter for screening for malignant neoplasm of colon: Secondary | ICD-10-CM | POA: Diagnosis not present

## 2021-04-22 DIAGNOSIS — Z8639 Personal history of other endocrine, nutritional and metabolic disease: Secondary | ICD-10-CM | POA: Diagnosis not present

## 2021-04-22 DIAGNOSIS — M65871 Other synovitis and tenosynovitis, right ankle and foot: Secondary | ICD-10-CM | POA: Diagnosis not present

## 2021-04-22 DIAGNOSIS — M65872 Other synovitis and tenosynovitis, left ankle and foot: Secondary | ICD-10-CM | POA: Diagnosis not present

## 2021-04-22 DIAGNOSIS — M818 Other osteoporosis without current pathological fracture: Secondary | ICD-10-CM | POA: Diagnosis not present

## 2021-04-22 DIAGNOSIS — I739 Peripheral vascular disease, unspecified: Secondary | ICD-10-CM | POA: Diagnosis not present

## 2021-04-22 DIAGNOSIS — B351 Tinea unguium: Secondary | ICD-10-CM | POA: Diagnosis not present

## 2021-04-22 DIAGNOSIS — Z87311 Personal history of (healed) other pathological fracture: Secondary | ICD-10-CM | POA: Diagnosis not present

## 2021-04-22 NOTE — Progress Notes (Signed)
Discharge Progress Report  Patient Details  Name: Virginia Burns MRN: [id number] Date of Birth: 06-Mar-1946 Referring Provider:   April [facility] Walk Test from 02/12/2021 in [place]  Referring Provider Dr. Halford Chessman        Number of Visits: 16  Reason for Discharge:  Patient has met program and personal goals.  Smoking History:  Social History   Tobacco Use  Smoking Status Former   Packs/day: 1.00   Years: 25.00   Pack years: 25.00   Types: Cigarettes   Quit date: 05/18/2009   Years since quitting: 11.9  Smokeless Tobacco Never    Diagnosis:  Stage 3 severe COPD by GOLD classification ([facility])  Shortness of breath  ADL UCSD:  Pulmonary Assessment Scores     Row Name 02/12/21 1103 04/08/21 1150       ADL UCSD   ADL Phase Entry Exit    SOB Score total 81 71      CAT Score   CAT Score 22 19      mMRC Score   mMRC Score 3 2             Initial Exercise Prescription:  Initial Exercise Prescription - 02/12/21 1100       Date of Initial Exercise RX and Referring Provider   Date 02/12/21    Referring Provider Dr. Halford Chessman    Expected Discharge Date 04/17/21      Oxygen   Oxygen Continuous    Liters 2      NuStep   Level 1    SPM 75    Minutes 30      Prescription Details   Frequency (times per week) 2    Duration Progress to 30 minutes of continuous aerobic without signs/symptoms of physical distress      Intensity   THRR 40-80% of Max Heartrate 58-117    Ratings of Perceived Exertion 11-13    Perceived Dyspnea 0-4      Progression   Progression Continue to progress workloads to maintain intensity without signs/symptoms of physical distress.      Resistance Training   Training Prescription Yes    Weight Red bands    Reps 10-15             Discharge Exercise Prescription (Final Exercise Prescription Changes):  Exercise Prescription Changes - 04/15/21 1200       Response to Exercise    Blood Pressure (Admit) 152/82    Blood Pressure (Exercise) 136/80    Blood Pressure (Exit) 142/64    Heart Rate (Admit) 97 bpm    Heart Rate (Exercise) 94 bpm    Heart Rate (Exit) 91 bpm    Oxygen Saturation (Admit) 98 %    Oxygen Saturation (Exercise) 98 %    Oxygen Saturation (Exit) 97 %    Rating of Perceived Exertion (Exercise) 12    Perceived Dyspnea (Exercise) 2    Duration Continue with 30 min of aerobic exercise without signs/symptoms of physical distress.    Intensity THRR unchanged      Progression   Progression Continue to progress workloads to maintain intensity without signs/symptoms of physical distress.      Resistance Training   Training Prescription Yes    Weight Red bands    Reps 10-15    Time 10 Minutes      Oxygen   Oxygen Continuous    Liters 2      NuStep   Level 4  SPM 60    Minutes 30    METs 1.4             Functional Capacity:  6 Minute Walk     Row Name 02/12/21 1140 04/17/21 1526       6 Minute Walk   Phase Initial Discharge    Distance 705 feet 800 feet    Distance % Change -- 13.48 %    Distance Feet Change -- 95 ft    Walk Time 6 minutes 6 minutes    # of Rest Breaks 0 0    MPH 1.34 1.52    METS 1.77 2.01    RPE 13 13    Perceived Dyspnea  1 1    VO2 Peak 6.2 7.05    Symptoms Yes (comment) Yes (comment)    Comments Pt stated she was fatigued and would feel sore tomorrow Headache 5/10    Resting HR 73 bpm 85 bpm    Resting BP 146/60 130/62    Resting Oxygen Saturation  99 % 98 %    Exercise Oxygen Saturation  during 6 min walk 97 % 88 %    Max Ex. HR 111 bpm 122 bpm    Max Ex. BP 144/70 138/70    2 Minute Post BP 140/60 132/70      Interval HR   1 Minute HR 89 100    2 Minute HR 107 104    3 Minute HR 100 108    4 Minute HR 107 120    5 Minute HR 108 122    6 Minute HR 111 102    2 Minute Post HR 92 97    Interval Heart Rate? Yes Yes      Interval Oxygen   Interval Oxygen? Yes Yes    Baseline Oxygen  Saturation % 99 %  2L 98 %  2L    1 Minute Oxygen Saturation % 100 % 88 %  artifact    1 Minute Liters of Oxygen 2 L 2 L    2 Minute Oxygen Saturation % 97 % 92 %    2 Minute Liters of Oxygen 2 L 2 L    3 Minute Oxygen Saturation % 97 % 90 %    3 Minute Liters of Oxygen 2 L 2 L    4 Minute Oxygen Saturation % 97 % 91 %    4 Minute Liters of Oxygen 2 L 2 L    5 Minute Oxygen Saturation % 98 % 93 %    5 Minute Liters of Oxygen 2 L 2 L    6 Minute Oxygen Saturation % 97 % 96 %    6 Minute Liters of Oxygen 2 L 2 L    2 Minute Post Oxygen Saturation % 97 % 98 %    2 Minute Post Liters of Oxygen 2 L 2 L             Psychological, QOL, Others - Outcomes: PHQ 2/9: Depression screen [facility] 2/9 04/17/2021 02/12/2021 09/02/2020  Decreased Interest 0 0 0  Down, Depressed, Hopeless 0 0 0  PHQ - 2 Score 0 0 0  Altered sleeping 0 0 0  Tired, decreased energy 0 0 0  Change in appetite 0 0 0  Feeling bad or failure about yourself  0 0 0  Trouble concentrating 0 0 0  Moving slowly or fidgety/restless 0 0 0  Suicidal thoughts 0 0 0  PHQ-9 Score 0 0 0  Difficult doing work/chores Not difficult at all Not difficult at all Not difficult at all  Some recent data might be hidden    Quality of Life:   Personal Goals: Goals established at orientation with interventions provided to work toward goal.  Personal Goals and Risk Factors at Admission - 02/12/21 1118       Core Components/Risk Factors/Patient Goals on Admission   Improve shortness of breath with ADL's Yes    Intervention Provide education, individualized exercise plan and daily activity instruction to help decrease symptoms of SOB with activities of daily living.    Expected Outcomes Short Term: Improve cardiorespiratory fitness to achieve a reduction of symptoms when performing ADLs;Long Term: Be able to perform more ADLs without symptoms or delay the onset of symptoms              Personal Goals Discharge:  Goals and Risk  Factor Review     Row Name 02/12/21 1121 02/17/21 1000 02/17/21 1004 03/18/21 0926       Core Components/Risk Factors/Patient Goals Review   Personal Goals Review Develop more efficient breathing techniques such as purse lipped breathing and diaphragmatic breathing and practicing self-pacing with activity.;Increase knowledge of respiratory medications and ability to use respiratory devices properly.;Improve shortness of breath with ADL's Increase knowledge of respiratory medications and ability to use respiratory devices properly. Increase knowledge of respiratory medications and ability to use respiratory devices properly.;Improve shortness of breath with ADL's;Develop more efficient breathing techniques such as purse lipped breathing and diaphragmatic breathing and practicing self-pacing with activity. Increase knowledge of respiratory medications and ability to use respiratory devices properly.;Improve shortness of breath with ADL's;Develop more efficient breathing techniques such as purse lipped breathing and diaphragmatic breathing and practicing self-pacing with activity.    Review -- -- Jayci will begin exercising in pulmonary rehab 02/18/2021 so it is too soon for her to meet any program or personal goals. Kalayna has been slow to progress with her exercise workloads due to a recent fall.  She is exercising at 1.5 mets on the nustep for 30 minutes.  She is very weak and deconditioned.    Expected Outcomes -- -- See admission goals. See admission goals.             Exercise Goals and Review:  Exercise Goals     Row Name 02/12/21 1147 02/17/21 1121 03/17/21 0758 04/14/21 0901       Exercise Goals   Increase Physical Activity Yes Yes Yes Yes    Intervention Provide advice, education, support and counseling about physical activity/exercise needs.;Develop an individualized exercise prescription for aerobic and resistive training based on initial evaluation findings, risk stratification,  comorbidities and participant's personal goals. Provide advice, education, support and counseling about physical activity/exercise needs.;Develop an individualized exercise prescription for aerobic and resistive training based on initial evaluation findings, risk stratification, comorbidities and participant's personal goals. Provide advice, education, support and counseling about physical activity/exercise needs.;Develop an individualized exercise prescription for aerobic and resistive training based on initial evaluation findings, risk stratification, comorbidities and participant's personal goals. Provide advice, education, support and counseling about physical activity/exercise needs.;Develop an individualized exercise prescription for aerobic and resistive training based on initial evaluation findings, risk stratification, comorbidities and participant's personal goals.    Expected Outcomes Short Term: Attend rehab on a regular basis to increase amount of physical activity.;Long Term: Add in home exercise to make exercise part of routine and to increase amount of physical activity.;Long Term: Exercising regularly at least 3-5 days a  week. Short Term: Attend rehab on a regular basis to increase amount of physical activity.;Long Term: Add in home exercise to make exercise part of routine and to increase amount of physical activity.;Long Term: Exercising regularly at least 3-5 days a week. Short Term: Attend rehab on a regular basis to increase amount of physical activity.;Long Term: Add in home exercise to make exercise part of routine and to increase amount of physical activity.;Long Term: Exercising regularly at least 3-5 days a week. Short Term: Attend rehab on a regular basis to increase amount of physical activity.;Long Term: Add in home exercise to make exercise part of routine and to increase amount of physical activity.;Long Term: Exercising regularly at least 3-5 days a week.    Increase Strength and  Stamina Yes Yes Yes Yes    Intervention Provide advice, education, support and counseling about physical activity/exercise needs.;Develop an individualized exercise prescription for aerobic and resistive training based on initial evaluation findings, risk stratification, comorbidities and participant's personal goals. Provide advice, education, support and counseling about physical activity/exercise needs.;Develop an individualized exercise prescription for aerobic and resistive training based on initial evaluation findings, risk stratification, comorbidities and participant's personal goals. Provide advice, education, support and counseling about physical activity/exercise needs.;Develop an individualized exercise prescription for aerobic and resistive training based on initial evaluation findings, risk stratification, comorbidities and participant's personal goals. Provide advice, education, support and counseling about physical activity/exercise needs.;Develop an individualized exercise prescription for aerobic and resistive training based on initial evaluation findings, risk stratification, comorbidities and participant's personal goals.    Expected Outcomes Short Term: Increase workloads from initial exercise prescription for resistance, speed, and METs.;Short Term: Perform resistance training exercises routinely during rehab and add in resistance training at home;Long Term: Improve cardiorespiratory fitness, muscular endurance and strength as measured by increased METs and functional capacity (6MWT) Short Term: Increase workloads from initial exercise prescription for resistance, speed, and METs.;Short Term: Perform resistance training exercises routinely during rehab and add in resistance training at home;Long Term: Improve cardiorespiratory fitness, muscular endurance and strength as measured by increased METs and functional capacity (6MWT) Short Term: Increase workloads from initial exercise prescription  for resistance, speed, and METs.;Short Term: Perform resistance training exercises routinely during rehab and add in resistance training at home;Long Term: Improve cardiorespiratory fitness, muscular endurance and strength as measured by increased METs and functional capacity (6MWT) Short Term: Increase workloads from initial exercise prescription for resistance, speed, and METs.;Short Term: Perform resistance training exercises routinely during rehab and add in resistance training at home;Long Term: Improve cardiorespiratory fitness, muscular endurance and strength as measured by increased METs and functional capacity (6MWT)    Able to understand and use rate of perceived exertion (RPE) scale Yes Yes Yes Yes    Intervention Provide education and explanation on how to use RPE scale Provide education and explanation on how to use RPE scale Provide education and explanation on how to use RPE scale Provide education and explanation on how to use RPE scale    Expected Outcomes Short Term: Able to use RPE daily in rehab to express subjective intensity level;Long Term:  Able to use RPE to guide intensity level when exercising independently Short Term: Able to use RPE daily in rehab to express subjective intensity level;Long Term:  Able to use RPE to guide intensity level when exercising independently Short Term: Able to use RPE daily in rehab to express subjective intensity level;Long Term:  Able to use RPE to guide intensity level when exercising independently Short Term:  Able to use RPE daily in rehab to express subjective intensity level;Long Term:  Able to use RPE to guide intensity level when exercising independently    Able to understand and use Dyspnea scale Yes Yes Yes Yes    Intervention Provide education and explanation on how to use Dyspnea scale Provide education and explanation on how to use Dyspnea scale Provide education and explanation on how to use Dyspnea scale Provide education and explanation on  how to use Dyspnea scale    Expected Outcomes Short Term: Able to use Dyspnea scale daily in rehab to express subjective sense of shortness of breath during exertion;Long Term: Able to use Dyspnea scale to guide intensity level when exercising independently Short Term: Able to use Dyspnea scale daily in rehab to express subjective sense of shortness of breath during exertion;Long Term: Able to use Dyspnea scale to guide intensity level when exercising independently Short Term: Able to use Dyspnea scale daily in rehab to express subjective sense of shortness of breath during exertion;Long Term: Able to use Dyspnea scale to guide intensity level when exercising independently Short Term: Able to use Dyspnea scale daily in rehab to express subjective sense of shortness of breath during exertion;Long Term: Able to use Dyspnea scale to guide intensity level when exercising independently    Knowledge and understanding of Target Heart Rate Range (THRR) Yes Yes Yes Yes    Intervention Provide education and explanation of THRR including how the numbers were predicted and where they are located for reference Provide education and explanation of THRR including how the numbers were predicted and where they are located for reference Provide education and explanation of THRR including how the numbers were predicted and where they are located for reference Provide education and explanation of THRR including how the numbers were predicted and where they are located for reference    Expected Outcomes Short Term: Able to state/look up THRR;Long Term: Able to use THRR to govern intensity when exercising independently;Short Term: Able to use daily as guideline for intensity in rehab Short Term: Able to state/look up THRR;Long Term: Able to use THRR to govern intensity when exercising independently;Short Term: Able to use daily as guideline for intensity in rehab Short Term: Able to state/look up THRR;Long Term: Able to use THRR to  govern intensity when exercising independently;Short Term: Able to use daily as guideline for intensity in rehab Short Term: Able to state/look up THRR;Long Term: Able to use THRR to govern intensity when exercising independently;Short Term: Able to use daily as guideline for intensity in rehab    Understanding of Exercise Prescription Yes Yes Yes Yes    Intervention Provide education, explanation, and written materials on patient's individual exercise prescription Provide education, explanation, and written materials on patient's individual exercise prescription Provide education, explanation, and written materials on patient's individual exercise prescription Provide education, explanation, and written materials on patient's individual exercise prescription    Expected Outcomes Short Term: Able to explain program exercise prescription;Long Term: Able to explain home exercise prescription to exercise independently Short Term: Able to explain program exercise prescription;Long Term: Able to explain home exercise prescription to exercise independently Short Term: Able to explain program exercise prescription;Long Term: Able to explain home exercise prescription to exercise independently Short Term: Able to explain program exercise prescription;Long Term: Able to explain home exercise prescription to exercise independently             Exercise Goals Re-Evaluation:  Exercise Goals Re-Evaluation     Row Name  02/12/21 1148 02/17/21 1122 03/17/21 0759 04/14/21 0901       Exercise Goal Re-Evaluation   Exercise Goals Review Increase Physical Activity;Increase Strength and Stamina;Able to understand and use rate of perceived exertion (RPE) scale;Able to understand and use Dyspnea scale;Knowledge and understanding of Target Heart Rate Range (THRR);Understanding of Exercise Prescription Increase Physical Activity;Increase Strength and Stamina;Able to understand and use rate of perceived exertion (RPE)  scale;Able to understand and use Dyspnea scale;Knowledge and understanding of Target Heart Rate Range (THRR);Understanding of Exercise Prescription Increase Physical Activity;Increase Strength and Stamina;Able to understand and use rate of perceived exertion (RPE) scale;Able to understand and use Dyspnea scale;Knowledge and understanding of Target Heart Rate Range (THRR);Understanding of Exercise Prescription Increase Physical Activity;Increase Strength and Stamina;Able to understand and use rate of perceived exertion (RPE) scale;Able to understand and use Dyspnea scale;Knowledge and understanding of Target Heart Rate Range (THRR);Understanding of Exercise Prescription    Comments Pt is scheduled to start exercise next week. We will monitor and progress as she is able. Pt is scheduled to start exercise this week. We will monitor and progress as she is able. Alisse has completed 5 exercise sessions. She exercise for 30 min on the Nustep at level 1 averaging 1.5 METs. Darnelle arrives to the Pulmonary Rehab ambulatory with a walker. She has mentioned to staff that her goal is to ambulated with a cane. Vallie performs the warm up and cool down standing/sitting dependent on her SOB and physical abilities. Tiarah has decreased balance and coordination, which contributes to sitting down during warm up and cool down. She tolerates exercise fair and is slow to progress. Shalin has increased her METS by 0.3 since starting Pulmonary Rehab. Will continue to monitor and progress as able. Jemimah has completed 14 exercise sessions. She is very deconditioned as she has barely increased her METs on the Nustep. She exercises on the Nustep for 30 min averaging 1.4 METs at level 4. She has increased her workload since starting Pulmonary Rehab. She is only able to use one arm since she fell and injured her elbow. She performs the warmup and cooldown standing/seated dependent on her shortness of breath. We have discussed her home  exercise plan. She is currently shopping for a seated cycle ergometer that can be moved easily. She showed me the cycle ergometer that she wants to get. Will continue to monitor and progress as able.    Expected Outcomes Through exercise at rehab and home, the patient will decrease shortness of breath with daily activities and feel confident in carrying out an exercise regimn at home. Through exercise at rehab and home, the patient will decrease shortness of breath with daily activities and feel confident in carrying out an exercise regimn at home. Through exercise at rehab and home, the patient will decrease shortness of breath with daily activities and feel confident in carrying out an exercise regimn at home. Through exercise at rehab and home, the patient will decrease shortness of breath with daily activities and feel confident in carrying out an exercise regimn at home.             Nutrition & Weight - Outcomes:  Pre Biometrics - 02/12/21 1059       Pre Biometrics   Height _0  (1.651 m)    Weight 77.6 kg    BMI (Calculated) 28.47    Grip Strength 15 kg             Post Biometrics - 04/17/21 1526  Post  Biometrics   Weight 76.9 kg    Grip Strength 14 kg             Nutrition:   Nutrition Discharge:   Education Questionnaire Score:  Knowledge Questionnaire Score - 04/15/21 1154       Knowledge Questionnaire Score   Post Score 17/18             Goals reviewed with patient; copy given to patient.

## 2021-04-27 ENCOUNTER — Encounter: Payer: Self-pay | Admitting: Cardiovascular Disease

## 2021-04-27 LAB — COLOGUARD: COLOGUARD: NEGATIVE

## 2021-04-27 LAB — EXTERNAL GENERIC LAB PROCEDURE: COLOGUARD: NEGATIVE

## 2021-05-06 ENCOUNTER — Ambulatory Visit: Payer: PPO | Admitting: Primary Care

## 2021-05-06 ENCOUNTER — Other Ambulatory Visit: Payer: Self-pay

## 2021-05-06 ENCOUNTER — Encounter: Payer: Self-pay | Admitting: Primary Care

## 2021-05-06 ENCOUNTER — Ambulatory Visit (INDEPENDENT_AMBULATORY_CARE_PROVIDER_SITE_OTHER): Payer: PPO

## 2021-05-06 VITALS — BP 140/62 | HR 101 | Temp 97.9°F | Ht 65.0 in | Wt 170.2 lb

## 2021-05-06 DIAGNOSIS — I10 Essential (primary) hypertension: Secondary | ICD-10-CM

## 2021-05-06 DIAGNOSIS — J9611 Chronic respiratory failure with hypoxia: Secondary | ICD-10-CM

## 2021-05-06 DIAGNOSIS — J449 Chronic obstructive pulmonary disease, unspecified: Secondary | ICD-10-CM

## 2021-05-06 DIAGNOSIS — I479 Paroxysmal tachycardia, unspecified: Secondary | ICD-10-CM

## 2021-05-06 DIAGNOSIS — R0602 Shortness of breath: Secondary | ICD-10-CM

## 2021-05-06 DIAGNOSIS — G4733 Obstructive sleep apnea (adult) (pediatric): Secondary | ICD-10-CM | POA: Diagnosis not present

## 2021-05-06 LAB — CBC WITH DIFFERENTIAL/PLATELET
Basophils Absolute: 0.1 10*3/uL (ref 0.0–0.1)
Basophils Relative: 0.8 % (ref 0.0–3.0)
Eosinophils Absolute: 0 10*3/uL (ref 0.0–0.7)
Eosinophils Relative: 0.5 % (ref 0.0–5.0)
HCT: 40.4 % (ref 36.0–46.0)
Hemoglobin: 13.2 g/dL (ref 12.0–15.0)
Lymphocytes Relative: 20.2 % (ref 12.0–46.0)
Lymphs Abs: 1.6 10*3/uL (ref 0.7–4.0)
MCHC: 32.6 g/dL (ref 30.0–36.0)
MCV: 94.7 fl (ref 78.0–100.0)
Monocytes Absolute: 0.7 10*3/uL (ref 0.1–1.0)
Monocytes Relative: 8.6 % (ref 3.0–12.0)
Neutro Abs: 5.5 10*3/uL (ref 1.4–7.7)
Neutrophils Relative %: 69.9 % (ref 43.0–77.0)
Platelets: 410 10*3/uL — ABNORMAL HIGH (ref 150.0–400.0)
RBC: 4.27 Mil/uL (ref 3.87–5.11)
RDW: 14 % (ref 11.5–15.5)
WBC: 7.9 10*3/uL (ref 4.0–10.5)

## 2021-05-06 LAB — BASIC METABOLIC PANEL
BUN: 7 mg/dL (ref 6–23)
CO2: 30 mEq/L (ref 19–32)
Calcium: 9.9 mg/dL (ref 8.4–10.5)
Chloride: 91 mEq/L — ABNORMAL LOW (ref 96–112)
Creatinine, Ser: 0.47 mg/dL (ref 0.40–1.20)
GFR: 93.88 mL/min (ref >60.00)
Glucose, Bld: 95 mg/dL (ref 70–99)
Potassium: 4.5 mEq/L (ref 3.5–5.1)
Sodium: 129 mEq/L — ABNORMAL LOW (ref 135–145)

## 2021-05-06 LAB — VITAMIN D 25 HYDROXY (VIT D DEFICIENCY, FRACTURES): VITD: 40.7 ng/mL (ref 30.00–100.00)

## 2021-05-06 LAB — D-DIMER, QUANTITATIVE: D-Dimer, Quant: 0.22 mcg/mL FEU (ref <0.50)

## 2021-05-06 LAB — BRAIN NATRIURETIC PEPTIDE: Pro B Natriuretic peptide (BNP): 45 pg/mL (ref 0.0–100.0)

## 2021-05-06 NOTE — Progress Notes (Signed)
Sodium was low at 129. Patient needs to follow-up with PCP regarding hyponatremia as soon as possible. He should follow strict 1.5L free fluid restriction.

## 2021-05-06 NOTE — Progress Notes (Signed)
The rest of her labs were normal including BNP and vit D

## 2021-05-06 NOTE — Progress Notes (Signed)
Reviewed and agree with assessment/plan.   Chesley Mires, MD [facility] Pulmonary/Critical Care 05/06/2021, 1:45 PM Pager:  [phone]

## 2021-05-06 NOTE — Assessment & Plan Note (Signed)
-   Well optimized on present treatment. No signs of acute exacerbation. She is compliant with Symbicort 160 and Spiriva handihaler. No change to plan at this time.

## 2021-05-06 NOTE — Progress Notes (Signed)
Please let patient know there was no acute cardiopulmonary disease on CXR. She has some scarring left lung base which is similar to previous. Nothing concerning at this time

## 2021-05-06 NOTE — Patient Instructions (Addendum)
COPD did not appear exacerbated today This could be related to deconditioning, untreated sleep apnea or cardiac I will reach out to Dr. Catha Nottingham and O'Neal Encourage you make follow-up visit with cardiology   BP today 140/62 right arm HR 125 with ambulation; O2 88-90% 2L (increased to 3L with exertion)  Recommendations: Continue Symbicort 160 two puffs morning and evening Continue Spiriva Handihaler two puffs daily in the morning Use 3L with exertion and 2L at rest and bedtime   Orders: Labs and CXR  Follow-up: First available with Dr. Halford Burns

## 2021-05-06 NOTE — Assessment & Plan Note (Addendum)
-   Patient reports increased shortness of breath with exertion. Resting pulse ox was 94% RA. She desaturated to 88% on 2L pulsed walking 25ft at slow pace, requiring 3L with exertion. Checking CXR, basic labs and BNP d/t increased oxygen demand and shortness of breath.

## 2021-05-06 NOTE — Assessment & Plan Note (Addendum)
-   BP 140/62 right arm

## 2021-05-06 NOTE — Progress Notes (Signed)
@Patient  ID: Virginia Burns, female    DOB: 1945-07-26, 75 y.o.   MRN: [id number]  Chief Complaint  Patient presents with   Follow-up    Patient says she has been having trouble with her oxygen and heart rate    Referring provider: Berkley Harvey, NP  HPI: 75 year old female, former smoker. PMH significant for emphysema, COPD, chronic respiratory failure, HTN, tachycardia, OSA, GERD, multinodular goiter, colon polyps, diverticulosis. Patient of Dr. Halford Chessman, last seen on 12/01/19. Recommended patient be started on Breztri. She is on nocturnal oxygen.  Previous [facility] pulmonary encounter: 06/12/2020 Patient presents today for 6 month follow-up. She reports increase dyspnea and tachycardia on exertion over the last 5 months. She gets winded with minimal activity. Occasional wheezing. She does not have a cough. She wears 2L oxygen at night. Lately she has been requiring oxygen during the day on exertion. She has a POC which she has had for over a year. States that Symbicort and Spiriva work well for her. She did not like Breztri. She uses her rescue inhaler once a week on average. She completed physical therapy, they want her to return in February. She saw cardiology in September 2021 for tachycardia. No evidence of obstructive PSA. He felt tachycardia was related to deconditioning and COPD.   06/27/2020 Patient presents today for 2 week follow-up with PFTs.  Patient was seen in January for 70-month follow-up and reported increased dyspnea and tachycardia on exertion over the last several months.  She is dyspneic with minimal activity. CXR showed slight increase in lingular atelectasis, no other significant change.  BNP and D-dimer were normal. Patient tells me that she did not feel well yesterday. She went to lunch with her friends and when she came home she had diarrhea. Reports that her HR went up to 150 and O2 was 88%. It took her awhile to recover, she needed to take Ativan to calm down. Her main  issues is her heart rate increasing into 130-150s with exertion. She is not requiring wearing oxygen during the day. She has been using incentive spirometer which helps her oxygen levels. Denies acute symptoms of cough, chest tightness or wheezing.   08/08/20- Dr. Audry Pili wasn't effective.  Trelegy helps, but doesn't seem to last the whole day.  She would like to switch back to symbicort and spiriva.    She isn't having cough, wheeze, or sputum.  She has appointment with cardiology to assess tachycardia. If that checks out okay, then she will be starting pulmonary rehab in couple of weeks.   05/06/2021 Patient presents today for acute visit tachycardia and dyspnea symptoms. For the last week she reports increased shortness of breath and increased heart rate. Prior to this she was using 2L supplemental oxygen with exertion and at bedtime only. She is needing to use her oxygen more, recently increased 3L. She has sleep apnea but unable to tolerate CPAP. Cardiology felt her tachycardia was related to her lungs and he did not want to adjust her BP medication. She is on diltiazem 240mg  daily. BP by provider check was 140/62 right arm.  She is taking Symbicort and Spiriva as directed. She finished pulmonary rehab which did not particularly help. She has no increase in cough, chest tightness or wheezing. Her weight is up a 1 or two lbs. She has been eating more sweets d/t the holidays. Influenza swab at home was negative. Denies cough, chest pain, chest tightness, wheezing.    At rest:  BP 140/62   O2 94% RA , HR 89 With exertion:  O2 88-94% 2L, HR 117-125   Pulmonary testing: PFT 08/09/07 >> FEV1 1.54 (63%), FEV1% 53, TLC 3.42 (65%), DLCO 64%, no BD PFT 02/10/13 >> FEV1 1.32 (52%), FEV1% 58, TLC 5.07 (96%), DLCO 60%, no BD PFT 06/27/2020 >> FEV1 0.93 (40%), FEV1% 53, TLC 5.58 (105%), DLCO 55%, no BD   Sleep testing: PSG 10/24/13 >> AHI 6.8, SaO2 low 84%, Spent 28.3 min with SaO2 <  90%.   Allergies  Allergen Reactions   Clindamycin/Lincomycin Hives   Bactrim [Sulfamethoxazole-Trimethoprim] Swelling    swelling   Elemental Sulfur Rash    Immunization History  Administered Date(s) Administered   Fluad Quad(high Dose 65+) 02/01/2019   Influenza Split 02/27/2009, 02/18/2010, 03/24/2011, 02/05/2012, 02/15/2013, 02/28/2013, 02/23/2014, 03/18/2015, 02/20/2016   Influenza Whole 02/16/2012   Influenza, High Dose Seasonal PF 02/28/2013, 02/20/2016, 03/05/2017, 03/18/2018, 02/01/2019, 01/30/2020   Influenza,inj,Quad PF,6+ Mos 03/18/2015   Influenza,inj,quad, With Preservative 02/23/2014, 03/18/2015   Influenza-Unspecified 02/28/2013, 02/20/2016, 02/27/2021   PFIZER(Purple Top)SARS-COV-2 Vaccination 06/30/2019, 07/21/2019, 03/27/2020   Pneumococcal Conjugate-13 01/10/2008, 03/07/2013   Pneumococcal Polysaccharide-23 03/02/2016   Pneumococcal-Unspecified 01/10/2008   Tdap 07/23/2009, 01/23/2019   Zoster, Live 01/09/2010    Past Medical History:  Diagnosis Date   Adjustment disorder with anxiety 08/17/2013   Allergic rhinitis    Anxiety state 08/17/2013   Overview:  IMPRESSION: hx of anxiety, lost a friend and has friend in ICU, using the ativan prn. had refilled 07/27/13   Aortic ejection murmur 07/14/2018   Benign neoplasm of ascending colon 12/07/2014   Benign neoplasm of descending colon 12/07/2014   Centrilobular emphysema ([place]) 12/07/2014   Chronic hyponatremia 10/05/2018   Last Assessment & Plan:  Formatting of this note is different from the original. Recent Labs    10/05/18 0007 10/06/18 0504 10/07/18 0733  NA 132* 131* 135   Baseline ~ 132   -stable   Chronic respiratory failure ([place]) 03/04/2015   Closed fracture of sacrum and coccyx ([place]) 10/03/2018   Last Assessment & Plan:  Formatting of this note might be different from the original. Patient presented with progressively worsening lower back pain and inability to ambulate  MR: Acute fracture of the right greater  than left sacral ala and S2 segment. CT: Nondisplaced, vertical and transverse acute sacral insufficiency Fractures  -s/p perQ fixation on 5/20  --Management per ortho primary team: P   Complication of anesthesia    Versed- hard time working - "dinging for days" - Colonoscopy   Constipation 12/09/2012   Formatting of this note might be different from the original. IMPRESSION: increased constipation: trial of amitiza, to push fiber, can use mirlax prn.  eat 4 prunes a day. call prn. has had colonoscopy in past. is due next year for f/u   COPD (chronic obstructive pulmonary disease) ([place]) 02/10/2013   COPD (chronic obstructive pulmonary disease) with chronic bronchitis ([place]) 02/10/2013   Overnight pulse oximetry shows desaturations greater than 5 minutes at a time less than 88%.  Order signed for nocturnal O2 at 2 L/m February 21, 2016  Overview:  Overnight pulse oximetry shows desaturations greater than 5 minutes at a time less than 88%.  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 therapy  12/07/2014   Diverticulosis of colon 10/12/2007   Qualifier: Diagnosis of  By: Nils Pyle CMA (AAMA), Mearl Latin     Essential tremor 05/22/2013   Gastroparesis    GERD (gastroesophageal reflux disease)    GERD with esophagitis 10/12/2007   Qualifier: Diagnosis of  By: Nils Pyle CMA (AAMA), Mearl Latin    Overview:  Overview:  Qualifier: Diagnosis of  By: Nils Pyle CMA Deborra Medina), Mearl Latin  Overview:  Overview:  Qualifier: Diagnosis of  By: Nils Pyle CMA Deborra Medina), Leisha    History of tobacco abuse 09/18/2013   Humerus distal fracture 01/24/2018   Hx of colonic polyps    Hypertension    Hypertension, benign 07/19/2007   Qualifier: Diagnosis of  By: Garen Grams    Overview:  Overview:  STORY: The goal for blood pressure  is less than 140/90.  If you are checking your blood pressure at home, please record it and bring it to your next office visit. 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  I have reviewed the recent sleep study results with the patient.  We discussed how sleep apnea can affect various health problems including risks for hypertension, cardiovascular disease, and diabetes.  We also discussed how sleep disruption can increase risks for accident, such as while driving.  Weight loss as a means of improving sl   OSA (obstructive sleep apnea) 09/18/2013   Osteoarthrosis 12/09/2012   Osteopenia of multiple sites 10/12/2007   Qualifier: Diagnosis of  By: Nils Pyle CMA (AAMA), Leisha     Osteoporosis    Palpitations 07/14/2018   Personal history of colonic polyps 10/07/2009   tubular adenoma   Tachycardia, paroxysmal ([place]) 07/04/2018   Urinary tract infection    Vitamin B12 deficiency    Vitamin D deficiency 12/09/2012   Overview:  Overview:  IMPRESSION: Appropiate labs done today. We will send the results and adjust treatment as needed. Bone density discussed. There has  been an improvement in her bone density comparing the one in 2009 from the one 2011. From osteoporotic she went to osteopenic. She did take Actonel 3 years ago for several years and stopped it due to GI side effects. We will continue mantaining f    Tobacco History: Social History   Tobacco Use  Smoking Status Former   Packs/day: 1.00   Years: 25.00   Pack years: 25.00   Types: Cigarettes   Quit  date: 05/18/2009   Years since quitting: 11.9  Smokeless Tobacco Never   Counseling given: Not Answered   Outpatient Medications Prior to Visit  Medication Sig Dispense Refill   AMBULATORY NON FORMULARY MEDICATION Take 10 mg by mouth 3 (three) times daily before meals. Medication Name: Domperidone 270 tablet 3   Ascorbic Acid (VITAMIN C) 1000 MG tablet Take 1,000 mg by mouth daily.     aspirin EC 81 MG tablet Take 81 mg by mouth every other day.     budesonide-formoterol (SYMBICORT) 160-4.5 MCG/ACT inhaler Inhale 2 puffs into the lungs 2 (two) times daily. 1 Inhaler 5   calcium carbonate (OS-CAL - DOSED IN MG OF  ELEMENTAL CALCIUM) 1250 (500 Ca) MG tablet Take 1 tablet by mouth. 600mg  bid     Cholecalciferol 125 MCG (5000 UT) TABS Take by mouth.     Cranberry 1000 MG CAPS Take by mouth.     denosumab (PROLIA) 60 MG/ML SOSY injection Inject into the skin.     Flaxseed, Linseed, (FLAX SEED OIL) 1000 MG CAPS Take by mouth.     gabapentin (NEURONTIN) 600 MG tablet Take 0.5 tablets by mouth 3 (three) times daily.     LORazepam (ATIVAN) 0.5 MG tablet Take 0.5 mg by mouth as needed for anxiety.      pantoprazole (PROTONIX) 40 MG tablet Take 1 tablet by mouth 2 (two) times daily.     Probiotic CAPS Take 1 capsule by mouth daily.     tiotropium (SPIRIVA) 18 MCG inhalation capsule Place 1 capsule (18 mcg total) into inhaler and inhale daily. 90 capsule 3   vitamin B-12 (CYANOCOBALAMIN) 1000 MCG tablet Take 2,000 mcg by mouth daily.     diltiazem (CARDIZEM CD) 240 MG 24 hr capsule Take 1 capsule (240 mg total) by mouth daily. 90 capsule 3   sucralfate (CARAFATE) 1 GM/10ML suspension TAKE 2 TEASPOONFUL (10 MLS) BY MOUTH  4 TIMES DAILY WITH MEALS AND AT BEDTIME (Patient taking differently: daily as needed. TAKE 2 TEASPOONFUL (10 MLS) BY MOUTH  4 TIMES DAILY WITH MEALS AND AT BEDTIME) 960 mL 1   No facility-administered medications prior to visit.   Review of Systems  Review of Systems   Constitutional: Negative.   HENT: Negative.    Respiratory:  Positive for shortness of breath. Negative for cough, chest tightness and wheezing.   Cardiovascular:        Racing heart      Physical Exam  BP 140/62 (BP Location: Right Arm, Patient Position: Sitting, Cuff Size: Normal)    Pulse (!) 101    Temp 97.9 F (36.6 C) (Oral)    Ht 5\' 5"  (1.651 m)    Wt 170 lb 3.2 oz (77.2 kg)    SpO2 93%    BMI 28.32 kg/m  Physical Exam Constitutional:      Appearance: Normal appearance.  HENT:     Head: Normocephalic and atraumatic.     Mouth/Throat:     Mouth: Mucous membranes are moist.     Pharynx: Oropharynx is clear.  Cardiovascular:     Rate and Rhythm: Normal rate and regular rhythm.     Heart sounds: Murmur heard.     Comments: RRR +Murmur. Tachycardic 124 with ambulation  Pulmonary:     Effort: Pulmonary effort is normal.     Breath sounds: No wheezing, rhonchi or rales.     Comments: CTA Musculoskeletal:        General: Normal range of motion.     Comments: Slow, steady gait- uses walker   Neurological:     General: No focal deficit present.     Mental Status: She is alert and oriented to person, place, and time. Mental status is at baseline.  Psychiatric:        Mood and Affect: Mood normal.        Behavior: Behavior normal.        Thought Content: Thought content normal.        Judgment: Judgment normal.     Lab Results:  CBC    Component Value Date/Time   WBC 6.8 06/12/2020 1237   RBC 4.15 06/12/2020 1237   HGB 13.0 06/12/2020  1237   HCT 39.1 06/12/2020 1237   PLT 379.0 06/12/2020 1237   MCV 94.3 06/12/2020 1237   MCH 31.7 01/22/2018 1620   MCHC 33.1 06/12/2020 1237   RDW 13.7 06/12/2020 1237   LYMPHSABS 1.2 01/22/2018 1620   MONOABS 2.1 (H) 01/22/2018 1620   EOSABS 0.0 01/22/2018 1620   BASOSABS 0.0 01/22/2018 1620    BMET    Component Value Date/Time   NA 129 (L) 06/12/2020 1237   K 4.1 06/12/2020 1237   CL 94 (L) 06/12/2020 1237   CO2 28  06/12/2020 1237   GLUCOSE 77 06/12/2020 1237   BUN 11 06/12/2020 1237   CREATININE 0.55 06/12/2020 1237   CALCIUM 9.9 06/12/2020 1237   GFRNONAA >60 01/27/2018 0353   GFRAA >60 01/27/2018 0353    BNP No results found for: BNP  ProBNP    Component Value Date/Time   PROBNP 37.0 06/12/2020 1237    Imaging: No results found.   Assessment & Plan:   COPD (chronic obstructive pulmonary disease) - Well optimized on present treatment. No signs of acute exacerbation. She is compliant with Symbicort 160 and Spiriva handihaler. No change to plan at this time.   OSA (obstructive sleep apnea) - Hx mild OSA, CPAP intolerant - Continue nocturnal oxygen - At risk for pulmonary HTN, last echo was in 2020- consider repeating   Chronic respiratory failure ([place]) - Patient reports increased shortness of breath with exertion. Resting pulse ox was 94% RA. She desaturated to 88% on 2L pulsed walking 239ft at slow pace, requiring 3L with exertion. Checking CXR, basic labs and BNP d/t increased oxygen demand and shortness of breath.   Tachycardia, paroxysmal (HCC) - Exertional tachycardia 110-120s  - Continue Diltiazem 240mg  daily - Checking d-dimer  - Following with cardiology   Hypertension, benign - BP 140/62 right arm   Martyn Ehrich, NP 05/06/2021

## 2021-05-06 NOTE — Assessment & Plan Note (Addendum)
-   Exertional tachycardia 110-120s  - Continue Diltiazem 240mg  daily - Checking d-dimer  - Following with cardiology

## 2021-05-06 NOTE — Assessment & Plan Note (Signed)
-   Hx mild OSA, CPAP intolerant - Continue nocturnal oxygen - At risk for pulmonary HTN, last echo was in 2020- consider repeating

## 2021-05-07 ENCOUNTER — Telehealth: Payer: Self-pay | Admitting: Primary Care

## 2021-05-07 DIAGNOSIS — R0602 Shortness of breath: Secondary | ICD-10-CM

## 2021-05-07 NOTE — Telephone Encounter (Signed)
D-dimer was normal. CXR showed no acute cardiopulmonary disease, lingular scarring. Recommend getting Hight resolution CT chest to better evaluate lungs (I have ordered this). Low likely hood of pulmonary embolism with normal D-DIMER. She needs to follow up with cardiology d/t ongoing tachycardia

## 2021-05-07 NOTE — Addendum Note (Signed)
Addended by: Lorretta Harp on: 05/07/2021 04:25 PM   Modules accepted: Orders

## 2021-05-07 NOTE — Telephone Encounter (Signed)
Called and spoke with pt letting her know results per BW and stated to her that she has ordered a HRCT. Pt verbalized understanding. Also stated to her that she needed to f/u with cards about tachycardia and she verbalized understanding. Nothing further needed.

## 2021-05-14 NOTE — Telephone Encounter (Signed)
PCC's please advise. Thanks 

## 2021-05-20 NOTE — Telephone Encounter (Signed)
Will forward to [facility] as FYI of approval per pt.

## 2021-05-26 ENCOUNTER — Encounter: Payer: Self-pay | Admitting: Pulmonary Disease

## 2021-05-27 MED ORDER — SPIRIVA RESPIMAT 2.5 MCG/ACT IN AERS: 2.0000 | INHALATION_SPRAY | Freq: Every day | RESPIRATORY_TRACT | 5 refills | Status: DC

## 2021-05-27 NOTE — Telephone Encounter (Signed)
Spiriva 2.5 mcg two puffs daily.

## 2021-05-27 NOTE — Telephone Encounter (Signed)
Patient is aware that Dr. Halford Chessman has sent in a prescription. Nothing further needed.

## 2021-05-27 NOTE — Telephone Encounter (Signed)
Okay to send script for spiriva respimat two puffs daily and can send three month supply to [facility] in [place].

## 2021-05-27 NOTE — Telephone Encounter (Signed)
Dr. Halford Chessman, please advise if you would like the 2.60mcg or 1.25 mcg strength for the spiriva respimat. Thanks.

## 2021-05-27 NOTE — Telephone Encounter (Signed)
Please advise if ok to send in Spiriva HandiHaler for this patient?

## 2021-05-29 ENCOUNTER — Encounter: Payer: Self-pay | Admitting: Cardiovascular Disease

## 2021-06-23 ENCOUNTER — Ambulatory Visit: Payer: PPO | Admitting: Pulmonary Disease

## 2021-06-23 ENCOUNTER — Other Ambulatory Visit: Payer: Self-pay

## 2021-06-23 ENCOUNTER — Encounter: Payer: Self-pay | Admitting: Pulmonary Disease

## 2021-06-23 VITALS — BP 122/78 | HR 78 | Temp 98.4°F | Ht 65.0 in | Wt 164.4 lb

## 2021-06-23 DIAGNOSIS — J9611 Chronic respiratory failure with hypoxia: Secondary | ICD-10-CM | POA: Diagnosis not present

## 2021-06-23 DIAGNOSIS — J449 Chronic obstructive pulmonary disease, unspecified: Secondary | ICD-10-CM

## 2021-06-23 NOTE — Patient Instructions (Signed)
Follow up in 4 months 

## 2021-06-23 NOTE — Progress Notes (Signed)
[facility]  Chief Complaint  Patient presents with   Follow-up    Pt states she was having issues with her new inhaler. Pt states that her COPD is doing ok somedays but been doing worse since being in rehab    Constitutional:  BP 122/78 (BP Location: Left Arm, Patient Position: Sitting, Cuff Size: Normal)    Pulse 78    Temp 98.4 F (36.9 C) (Oral)    Ht 5\' 5"  (1.651 m)    Wt 164 lb 6.4 oz (74.6 kg)    SpO2 98%    BMI 27.36 kg/m   Past Medical History:  Anxiety, Diverticulosis, Tremor, Gastroparesis, GERD, Colon polyps, HTN, Insomnia, Goiter, OA, Osteopenia, B12 deficiency, Vit D deficiency  Past Surgical History:  She  has a past surgical history that includes Cholecystectomy; Abdominal hysterectomy; Open reduction internal fixation (orif) distal radial fracture (Left, 04/18/2013); Ligament repair (Left, 04/18/2013); Oophorectomy; Colonoscopy with propofol (N/A, 11/21/2014); Gallbladder surgery (1986); Cataract extraction (Right); Abdominal hysterectomy; Fracture surgery; and ORIF elbow fracture (Left, 01/24/2018).  Brief Summary:  Virginia Burns is a 76 y.o. female former smoker with COPD and chronic hypoxemic respiratory failure.      Subjective:   She is here with her friend.  CT chest showed stable changes of emphysema and smoking related lung nodules.  Using oxygen 24/7 now.  Wants to get back into exercising.  Has occasional cough and chest congestion.  She was told by cardiology her fast heart rate is from her breathing issues.  Physical Exam:   Appearance - well kempt, wearing oxygen  ENMT - no sinus tenderness, no oral exudate, no LAN, Mallampati 3 airway, no stridor  Respiratory - decreased breath sounds bilaterally, no wheezing or rales  CV - s1s2 regular rate and rhythm, no murmurs  Ext - no clubbing, no edema  Skin - no rashes  Psych - normal mood and affect    Pulmonary testing:  PFT 08/09/07 >> FEV1 1.54  (63%), FEV1% 53, TLC 3.42 (65%), DLCO 64%, no BD PFT 02/10/13 >> FEV1 1.32 (52%), FEV1% 58, TLC 5.07 (96%), DLCO 60%, no BD PFT 06/27/20 >> FEV1 0.96 (41%), FEV1% 52, TLC 5.58 (105%), RV 3.66 (157%), DLCO 54%  Chest Imaging:  CT chest 07/03/20 >> atherosclerosis, bandlike opacities in lingular/Lt and Rt lower lobes, no change in nodules (benign), dextroscoliosis HRCT chest 05/29/21 >> mild emphysema, scarring in lower lobes and lingula, fine centrilobular nodules mostly in lung apices  Sleep Tests:  PSG 10/24/13 >> AHI 6.8, SaO2 low 84%, Spent 28.3 min with SaO2 < 90%.  Cardiac Tests:  Echo 07/18/18 >> EF 60 to 65%  Social History:  She  reports that she quit smoking about 12 years ago. Her smoking use included cigarettes. She has a 25.00 pack-year smoking history. She has never used smokeless tobacco. She reports current alcohol use of about 2.0 standard drinks per week. She reports that she does not use drugs.  Family History:  Her family history includes COPD in her mother; Colitis in her son; Colon cancer in her cousin; Diabetes in her brother; Diabetes type II in her son; Emphysema in her mother; Other in her father; Tremor in her maternal aunt and mother.     Assessment/Plan:   COPD with emphysema. - breztri not effective, and she felt that trelegy didn't last 24 hours - continue symbicort 160 two puffs bid, and spiriva respimat - prn albuterol - she will start using an ellipse  trainer  Chronic respiratory failure with hypoxia. - 2 liters 24/7  Lung nodules. - benign - no additional follow up needed  Time Spent Involved in Patient Care on Day of Examination:  38 minutes  Follow up:   Patient Instructions  Follow up in 4 months  Medication List:   Allergies as of 06/23/2021       Reactions   Clindamycin/lincomycin Hives   Bactrim [sulfamethoxazole-trimethoprim] Swelling   swelling   Elemental Sulfur Rash        Medication List        Accurate as of June 23, 2021 12:32 PM. If you have any questions, ask your nurse or doctor.          AMBULATORY NON FORMULARY MEDICATION Take 10 mg by mouth 3 (three) times daily before meals. Medication Name: Domperidone   aspirin EC 81 MG tablet Take 81 mg by mouth every other day.   budesonide-formoterol 160-4.5 MCG/ACT inhaler Commonly known as: Symbicort Inhale 2 puffs into the lungs 2 (two) times daily.   calcium carbonate 1250 (500 Ca) MG tablet Commonly known as: OS-CAL - dosed in mg of elemental calcium Take 1 tablet by mouth. 600mg  bid   Cholecalciferol 125 MCG (5000 UT) Tabs Take by mouth.   Cranberry 1000 MG Caps Take by mouth.   denosumab 60 MG/ML Sosy injection Commonly known as: PROLIA Inject into the skin.   diltiazem 240 MG 24 hr capsule Commonly known as: CARDIZEM CD Take 1 capsule (240 mg total) by mouth daily.   Flax Seed Oil 1000 MG Caps Take by mouth.   gabapentin 600 MG tablet Commonly known as: NEURONTIN Take 0.5 tablets by mouth 3 (three) times daily.   LORazepam 0.5 MG tablet Commonly known as: ATIVAN Take 0.5 mg by mouth as needed for anxiety.   pantoprazole 40 MG tablet Commonly known as: PROTONIX Take 1 tablet by mouth 2 (two) times daily.   Probiotic Caps Take 1 capsule by mouth daily.   Spiriva Respimat 2.5 MCG/ACT Aers Generic drug: Tiotropium Bromide Monohydrate Inhale 2 puffs into the lungs daily.   vitamin B-12 1000 MCG tablet Commonly known as: CYANOCOBALAMIN Take 2,000 mcg by mouth daily.   vitamin C 1000 MG tablet Take 1,000 mg by mouth daily.        Signature:  Chesley Mires, MD [place] Pager - [phone] 06/23/2021, 12:32 PM

## 2021-07-09 ENCOUNTER — Encounter: Payer: Self-pay | Admitting: Cardiovascular Disease

## 2021-08-13 ENCOUNTER — Other Ambulatory Visit: Payer: Self-pay | Admitting: Internal Medicine

## 2021-08-13 DIAGNOSIS — E042 Nontoxic multinodular goiter: Secondary | ICD-10-CM

## 2021-08-28 ENCOUNTER — Other Ambulatory Visit (HOSPITAL_COMMUNITY)
Admission: RE | Admit: 2021-08-28 | Discharge: 2021-08-28 | Disposition: A | Discharge status: Home / Self Care | Payer: PPO | Source: Ambulatory Visit | Attending: Interventional Radiology | Admitting: Interventional Radiology

## 2021-08-28 ENCOUNTER — Ambulatory Visit
Admission: RE | Admit: 2021-08-28 | Discharge: 2021-08-28 | Disposition: A | Discharge status: Home / Self Care | Payer: PPO | Source: Ambulatory Visit | Attending: Internal Medicine | Admitting: Internal Medicine

## 2021-08-28 DIAGNOSIS — E042 Nontoxic multinodular goiter: Secondary | ICD-10-CM

## 2021-08-28 DIAGNOSIS — E041 Nontoxic single thyroid nodule: Secondary | ICD-10-CM | POA: Insufficient documentation

## 2021-08-29 LAB — CYTOLOGY - NON PAP

## 2021-09-12 ENCOUNTER — Emergency Department (HOSPITAL_COMMUNITY): Payer: PPO

## 2021-09-12 ENCOUNTER — Emergency Department (HOSPITAL_COMMUNITY)
Admission: EM | Admit: 2021-09-12 | Discharge: 2021-09-12 | Disposition: A | Discharge status: Home / Self Care | Payer: PPO | Attending: Emergency Medicine | Admitting: Emergency Medicine

## 2021-09-12 ENCOUNTER — Other Ambulatory Visit: Payer: Self-pay

## 2021-09-12 ENCOUNTER — Encounter (HOSPITAL_COMMUNITY): Payer: Self-pay

## 2021-09-12 DIAGNOSIS — W19XXXA Unspecified fall, initial encounter: Secondary | ICD-10-CM

## 2021-09-12 DIAGNOSIS — R58 Hemorrhage, not elsewhere classified: Secondary | ICD-10-CM

## 2021-09-12 DIAGNOSIS — Z7982 Long term (current) use of aspirin: Secondary | ICD-10-CM | POA: Insufficient documentation

## 2021-09-12 DIAGNOSIS — W1830XA Fall on same level, unspecified, initial encounter: Secondary | ICD-10-CM | POA: Insufficient documentation

## 2021-09-12 DIAGNOSIS — S5011XA Contusion of right forearm, initial encounter: Secondary | ICD-10-CM | POA: Diagnosis not present

## 2021-09-12 DIAGNOSIS — R296 Repeated falls: Secondary | ICD-10-CM | POA: Diagnosis present

## 2021-09-12 DIAGNOSIS — S51012A Laceration without foreign body of left elbow, initial encounter: Secondary | ICD-10-CM | POA: Insufficient documentation

## 2021-09-12 DIAGNOSIS — Y92031 Bathroom in apartment as the place of occurrence of the external cause: Secondary | ICD-10-CM | POA: Diagnosis not present

## 2021-09-12 DIAGNOSIS — S51019A Laceration without foreign body of unspecified elbow, initial encounter: Secondary | ICD-10-CM

## 2021-09-12 DIAGNOSIS — Z23 Encounter for immunization: Secondary | ICD-10-CM | POA: Diagnosis not present

## 2021-09-12 DIAGNOSIS — S59902A Unspecified injury of left elbow, initial encounter: Secondary | ICD-10-CM | POA: Diagnosis present

## 2021-09-12 MED ORDER — BACITRACIN ZINC 500 UNIT/GM EX OINT
TOPICAL_OINTMENT | Freq: Two times a day (BID) | CUTANEOUS | Status: DC
  Filled 2021-09-12: qty 0.9

## 2021-09-12 MED ORDER — CEPHALEXIN 500 MG PO CAPS
500.0000 mg | ORAL_CAPSULE | Freq: Once | ORAL | Status: AC
Start: 2021-09-12 — End: 2021-09-12
  Administered 2021-09-12: 500 mg via ORAL
  Filled 2021-09-12: qty 1

## 2021-09-12 MED ORDER — CEPHALEXIN 500 MG PO CAPS
500.0000 mg | ORAL_CAPSULE | Freq: Four times a day (QID) | ORAL | 0 refills | Status: DC
Start: 2021-09-12 — End: 2022-06-26

## 2021-09-12 MED ORDER — TETANUS-DIPHTH-ACELL PERTUSSIS 5-2.5-18.5 LF-MCG/0.5 IM SUSY
0.5000 mL | PREFILLED_SYRINGE | Freq: Once | INTRAMUSCULAR | Status: AC
  Administered 2021-09-12: 0.5 mL via INTRAMUSCULAR
  Filled 2021-09-12: qty 0.5

## 2021-09-12 NOTE — ED Notes (Signed)
Right hand, left pointer finger, and left elbow wounds cleaned and bandaged with bacitracin and xeroform gauze per MD.  ?

## 2021-09-12 NOTE — ED Provider Notes (Signed)
?[facility] DEPT ?Provider Note ? ? ?CSN: [id number] ?Arrival date & time: 09/12/21  6269 ? ?  ? ?History ? ?Chief Complaint  ?Patient presents with  ? Fall  ? ? ?Virginia Burns is a 76 y.o. female. ? ?The history is provided by the patient.  ?Fall ?This is a new problem. The current episode started less than 1 hour ago. The problem occurs rarely. The problem has not changed since onset.Pertinent negatives include no chest pain, no abdominal pain, no headaches and no shortness of breath. Nothing aggravates the symptoms. Nothing relieves the symptoms. She has tried nothing for the symptoms. The treatment provided no relief.  ?[place] forward multiple skin tears on B hand left elbow and small one on knees.  Did hit head.  On ASA.   ?  ? ?Home Medications ?Prior to Admission medications   ?Medication Sig Start Date End Date Taking? Authorizing Provider  ?AMBULATORY NON FORMULARY MEDICATION Take 10 mg by mouth 3 (three) times daily before meals. Medication Name: Domperidone 02/27/21  Yes Irene Shipper, MD  ?Ascorbic Acid (VITAMIN C) 1000 MG tablet Take 1,000 mg by mouth daily.   Yes [provider]  ?aspirin EC 81 MG tablet Take 81 mg by mouth daily.   Yes [provider]  ?azithromycin (ZITHROMAX) 250 MG tablet Take 250-500 mg by mouth See admin instructions. Take '500mg'$  (2 tablets) on the first day and then '250mg'$  (1 tablet) every day after. 09/10/21  Yes [provider]  ?budesonide-formoterol (SYMBICORT) 160-4.5 MCG/ACT inhaler Inhale 2 puffs into the lungs 2 (two) times daily. 01/04/18  Yes Chesley Mires, MD  ?calcium carbonate (OS-CAL - DOSED IN MG OF ELEMENTAL CALCIUM) 1250 (500 Ca) MG tablet Take 1 tablet by mouth 2 (two) times daily with a meal.   Yes [provider]  ?Cholecalciferol 25 MCG (1000 UT) tablet Take 2,000 Units by mouth daily. 11/07/18  Yes [provider]  ?Cranberry 1000 MG CAPS Take 1,000 mg by mouth daily.   Yes [provider]  ?diltiazem (CARDIZEM CD) 240 MG 24 hr capsule Take 1 capsule (240 mg total) by mouth daily. 09/16/20 09/13/22 Yes O'Neal, Cassie Freer, MD  ?Flaxseed, Linseed, (FLAX SEED OIL) 1000 MG CAPS Take 2,000 mg by mouth daily.   Yes [provider]  ?gabapentin (NEURONTIN) 600 MG tablet Take 0.5 tablets by mouth 2 (two) times daily. 09/04/19  Yes [provider]  ?LORazepam (ATIVAN) 0.5 MG tablet Take 0.5 mg by mouth as needed for anxiety.    Yes [provider]  ?pantoprazole (PROTONIX) 40 MG tablet Take 1 tablet by mouth 2 (two) times daily. Gastroparesis 10/28/20  Yes [provider]  ?predniSONE (DELTASONE) 10 MG tablet Take 10-30 mg by mouth See admin instructions. Take 3 tablets daily for 3 days, then 2 tablets daily for 3 days, then one tablet daily for 3 days. 09/10/21  Yes [provider]  ?Probiotic CAPS Take 1 capsule by mouth 3 (three) times a week.   Yes [provider]  ?Tiotropium Bromide Monohydrate (SPIRIVA RESPIMAT) 2.5 MCG/ACT AERS Inhale 2 puffs into the lungs daily. 05/27/21  Yes Chesley Mires, MD  ?vitamin B-12 (CYANOCOBALAMIN) 1000 MCG tablet Take 2,000 mcg by mouth daily.   Yes [provider]  ?denosumab (PROLIA) 60 MG/ML SOSY injection Inject 60 mg into the skin every 6 (six) months. 11/09/19   [provider]  ?   ? ?Allergies    ?Clindamycin/lincomycin, Bactrim [sulfamethoxazole-trimethoprim], Doxycycline,  and Elemental sulfur   ? ?Review of Systems   ?Review of Systems  ?HENT:  Negative for facial swelling.   ?Eyes:  Negative for visual disturbance.  ?Respiratory:  Negative for shortness of breath.   ?Cardiovascular:  Negative for chest pain.  ?Gastrointestinal:  Negative for abdominal pain.  ?Musculoskeletal:  Negative for neck pain.  ?Skin:  Positive for wound.  ?Neurological:  Negative for seizures, speech difficulty, weakness, numbness and headaches.  ?All other systems reviewed and are negative. ? ?Physical  Exam ?Updated Vital Signs ?BP 131/70   Pulse 97   Temp 98.1 ?F (36.7 ?C) (Oral)   Resp 16   Ht 5' 5.5" (1.664 m)   Wt 72.6 kg   SpO2 100%   BMI 26.22 kg/m?  ?Physical Exam ?Vitals and nursing note reviewed.  ?Constitutional:   ?   Appearance: Normal appearance.  ?HENT:  ?   Head: Normocephalic.  ? ?   Nose: Nose normal.  ?   Mouth/Throat:  ?   Mouth: Mucous membranes are moist.  ?Eyes:  ?   Conjunctiva/sclera: Conjunctivae normal.  ?   Pupils: Pupils are equal, round, and reactive to light.  ?Cardiovascular:  ?   Rate and Rhythm: Normal rate and regular rhythm.  ?   Pulses: Normal pulses.  ?   Heart sounds: Normal heart sounds.  ?Pulmonary:  ?   Effort: Pulmonary effort is normal.  ?   Breath sounds: Normal breath sounds.  ?Abdominal:  ?   General: Abdomen is flat. Bowel sounds are normal.  ?   Palpations: Abdomen is soft.  ?   Tenderness: There is no abdominal tenderness. There is no guarding.  ?Musculoskeletal:     ?   General: No deformity.  ?     Arms: ? ?   Cervical back: Normal range of motion and neck supple. No tenderness.  ?     Legs: ? ?Skin: ?   General: Skin is warm and dry.  ?   Capillary Refill: Capillary refill takes less than 2 seconds.  ?Neurological:  ?   General: No focal deficit present.  ?   Mental Status: She is alert and oriented to person, place, and time.  ?   Deep Tendon Reflexes: Reflexes normal.  ?Psychiatric:     ?   Mood and Affect: Mood normal.  ? ? ?ED Results / Procedures / Treatments   ?Labs ?(all labs ordered are listed, but only abnormal results are displayed) ?Labs Reviewed - No data to display ? ?EKG ?None ? ?Radiology ?CT Head Wo Contrast ? ?Result Date: 09/12/2021 ?CLINICAL DATA:  Multiple falls. EXAM: CT HEAD WITHOUT CONTRAST TECHNIQUE: Contiguous axial images were obtained from the base of the skull through the vertex without intravenous contrast. RADIATION DOSE REDUCTION: This exam was performed according to the departmental dose-optimization program which includes  automated exposure control, adjustment of the mA and/or kV according to patient size and/or use of iterative reconstruction technique. COMPARISON:  No comparison studies available. FINDINGS: Brain: There is no evidence for acute hemorrhage, hydrocephalus, mass lesion, or abnormal extra-axial fluid collection. No definite CT evidence for acute infarction. Diffuse loss of parenchymal volume is consistent with atrophy. Patchy low attenuation in the deep hemispheric and periventricular white matter is nonspecific, but likely reflects chronic microvascular ischemic demyelination. Vascular: No hyperdense vessel or unexpected calcification. Skull: No evidence for fracture. No worrisome lytic or sclerotic lesion. Sinuses/Orbits: The visualized paranasal sinuses and mastoid air cells are clear. Visualized portions of the  globes and intraorbital fat are unremarkable. Other: None. IMPRESSION: 1. No acute intracranial abnormality. 2. Atrophy with chronic small vessel ischemic disease. Electronically Signed   By: Misty Stanley M.D.   On: 09/12/2021 05:48  ? ?DG Hand Complete Left ? ?Result Date: 09/12/2021 ?CLINICAL DATA:  76 year old female status post fall this morning. Bilateral hand skin tears. EXAM: LEFT HAND - COMPLETE 3+ VIEW COMPARISON:  Left wrist series 04/17/2013. FINDINGS: Osteopenia. Previous distal radius ORIF with no adverse hardware features. Chronic ulnar styloid fracture. Radiocarpal joint space loss. No acute carpal fracture or dislocation identified. Degenerative appearing subluxation of the 2nd MCP and IP joints. No acute metacarpal or phalanx fracture or dislocation identified. Pulse oximeter artifact on the 4th finger. No discrete soft tissue injury. IMPRESSION: 1. Osteopenia. No acute fracture or dislocation identified about the left hand. 2. Previous distal radius ORIF and chronic ulnar styloid fracture. 3. Radiocarpal joint degeneration. Electronically Signed   By: Genevie Ann M.D.   On: 09/12/2021 05:52   ? ?DG Hand Complete Right ? ?Result Date: 09/12/2021 ?CLINICAL DATA:  76 year old female status post fall this morning. Bilateral hand skin tears. EXAM: RIGHT HAND - COMPLETE 3+ VIEW COMPARISON:  None available. FINDINGS

## 2021-09-12 NOTE — Discharge Instructions (Addendum)
Bacitracin twice daily.  No submerging wounds  ?

## 2021-09-12 NOTE — ED Notes (Signed)
Pt to radiology via stretcher by rad tech. 

## 2021-09-12 NOTE — ED Triage Notes (Addendum)
Pt to ED via [facility] from home.  Pt fell yesterday morning and then fell tonight around 0030.  Pt was laying on floor when EMS arrived.  Pt states she got up to go to bathroom and she fell on her face.  Pt states she was very confused and it took her awhile to be able to call for help.  Pt denies LOC.  Pt c/o pain in both knees, left elbow, both wrists, face, and both hands.  Pt denies blood thinners. ?

## 2021-09-12 NOTE — ED Notes (Signed)
Pt to CT

## 2021-09-12 NOTE — ED Notes (Signed)
Pt returned from CT °

## 2021-09-16 ENCOUNTER — Encounter (HOSPITAL_COMMUNITY): Payer: Self-pay

## 2021-10-25 ENCOUNTER — Other Ambulatory Visit: Payer: Self-pay | Admitting: Cardiovascular Disease

## 2021-11-10 ENCOUNTER — Other Ambulatory Visit: Payer: Self-pay | Admitting: Pulmonary Disease

## 2021-12-10 ENCOUNTER — Telehealth: Payer: Self-pay | Admitting: Cardiovascular Disease

## 2021-12-10 NOTE — Telephone Encounter (Signed)
Calling to see if its okay for her to use Baltaren cream with her heart medication. Please advise

## 2021-12-10 NOTE — Telephone Encounter (Signed)
Spoke to patient she was calling to see if ok to use Voltaren cream.Stated she has arthritis in both feet.Advised I will send message to Dr.O'Neal for advice.

## 2021-12-11 NOTE — Telephone Encounter (Signed)
Spoke to patient Dr.O'Neal advised ok to use Voltaren cream.

## 2021-12-22 ENCOUNTER — Telehealth: Payer: Self-pay | Admitting: Cardiovascular Disease

## 2021-12-22 NOTE — Telephone Encounter (Signed)
Noted. Thanks.

## 2021-12-22 NOTE — Telephone Encounter (Signed)
*  STAT* If patient is at the pharmacy, call can be transferred to refill team.   1. Which medications need to be refilled? (please list name of each medication and dose if known)   diltiazem (CARDIZEM CD) 240 MG 24 hr capsule  2. Which pharmacy/location (including street and city if local pharmacy) is medication to be sent to? [id number] - [place], [place] - [address]  3. Do they need a 30 day or 90 day supply?  90 day supply

## 2021-12-22 NOTE — Telephone Encounter (Signed)
STAT if HR is under 50 or over 120 (normal HR is 60-100 beats per minute)  What is your heart rate?  In the 90's today  Do you have a log of your heart rate readings (document readings)?  Yesterday, 8/06: 120  118 113 105, 108  Do you have any other symptoms?  No

## 2021-12-22 NOTE — Telephone Encounter (Signed)
Spoke to patient she stated she felt bad all weekend.Stated she had fast heart beat just laying on couch.Readings listed below.No chest pain.She always has sob,but no worse.Stated she feels better today.Appointment scheduled with Dr.O'Neal 8/11 at 11:30 am.

## 2021-12-23 MED ORDER — DILTIAZEM HCL ER COATED BEADS 240 MG PO CP24: 240.0000 mg | ORAL_CAPSULE | Freq: Every day | ORAL | 0 refills | Status: DC

## 2021-12-25 NOTE — Progress Notes (Signed)
Cardiology Office Note:   Date:  12/26/2021  NAME:  Virginia Burns    MRN: [id number] DOB:  11/17/1945   PCP:  Berkley Harvey, NP  Cardiologist:  None  Electrophysiologist:  None   Referring MD: Berkley Harvey, NP   Chief Complaint  Patient presents with   Follow-up        History of Present Illness:   Virginia Burns is a 76 y.o. female with a hx of COPD, HLD, HTN who presents for follow-up of tachycardia.  She reports she has been doing well.  Apparently over the weekend she had heart racing spells.  Her heart rate was in the 120s for several hours on Saturday as well as Sunday.  Symptoms have finally resolved.  She has had no increased oxygen requirement.  She reports her lung disease has been stable.  She reports no chest pain.  Unclear if she was in an arrhythmia or atrial fibrillation.  Her EKG demonstrates sinus rhythm with no acute ischemic changes or evidence of infarction.  She does have her baseline IVCD.  Exam consistent with a 2 out of 6 holosystolic murmur.  Blood pressure well-controlled.  No other change to medications.  Her tachycardia symptoms have resolved.  Problem List 1. COPD -2L O2 -moderate to severe 2. HTN 3. GERD 4. T chol 208, HDL 103, LDL 93, TG 60 5.  Former Tobacco abuse -20 pack years  6. Ectopic atrial tachycardia -Brief ectopic atrial tachycardia episodes detected (longest 9.8 seconds) -sx improved on diltiazem  7. Coronary calcifications on chest CT  Past Medical History: Past Medical History:  Diagnosis Date   Adjustment disorder with anxiety 08/17/2013   Allergic rhinitis    Anxiety state 08/17/2013   Overview:  IMPRESSION: hx of anxiety, lost a friend and has friend in ICU, using the ativan prn. had refilled 07/27/13   Aortic ejection murmur 07/14/2018   Benign neoplasm of ascending colon 12/07/2014   Benign neoplasm of descending colon 12/07/2014   Centrilobular emphysema ([place]) 12/07/2014   Chronic hyponatremia 10/05/2018   Last  Assessment & Plan:  Formatting of this note is different from the original. Recent Labs    10/05/18 0007 10/06/18 0504 10/07/18 0733  NA 132* 131* 135   Baseline ~ 132   -stable   Chronic respiratory failure ([place]) 03/04/2015   Closed fracture of sacrum and coccyx ([place]) 10/03/2018   Last Assessment & Plan:  Formatting of this note might be different from the original. Patient presented with progressively worsening lower back pain and inability to ambulate  MR: Acute fracture of the right greater than left sacral ala and S2 segment. CT: Nondisplaced, vertical and transverse acute sacral insufficiency Fractures  -s/p perQ fixation on 5/20  --Management per ortho primary team: P   Complication of anesthesia    Versed- hard time working - "dinging for days" - Colonoscopy   Constipation 12/09/2012   Formatting of this note might be different from the original. IMPRESSION: increased constipation: trial of amitiza, to push fiber, can use mirlax prn.  eat 4 prunes a day. call prn. has had colonoscopy in past. is due next year for f/u   COPD (chronic obstructive pulmonary disease) ([place]) 02/10/2013   COPD (chronic obstructive pulmonary disease) with chronic bronchitis ([place]) 02/10/2013   Overnight pulse oximetry shows desaturations greater than 5 minutes at a time less than 88%.  Order signed for nocturnal O2 at 2 L/m February 21, 2016  Overview:  Overnight pulse oximetry  shows desaturations greater than 5 minutes at a time less than 88%.  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 therapy 12/07/2014   Diverticulosis of colon 10/12/2007   Qualifier: Diagnosis of  By: Nils Pyle CMA (AAMA), Mearl Latin     Essential tremor 05/22/2013   Gastroparesis    GERD (gastroesophageal reflux disease)     GERD with esophagitis 10/12/2007   Qualifier: Diagnosis of  By: Nils Pyle CMA (AAMA), Mearl Latin    Overview:  Overview:  Qualifier: Diagnosis of  By: Nils Pyle CMA Deborra Medina), Mearl Latin  Overview:  Overview:  Qualifier: Diagnosis of  By: Nils Pyle CMA Deborra Medina), Leisha    History of tobacco abuse 09/18/2013   Humerus distal fracture 01/24/2018   Hx of colonic polyps    Hypertension    Hypertension, benign 07/19/2007   Qualifier: Diagnosis of  By: Garen Grams    Overview:  Overview:  STORY: The goal for blood pressure is less than 140/90.  If you are checking your blood pressure at home, please record it and bring it to your next office visit. 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  I have reviewed the recent sleep study results with the patient.  We discussed how sleep apnea can affect various health problems including risks for hypertension, cardiovascular disease, and diabetes.  We also discussed how sleep disruption can increase risks for accident, such as while driving.  Weight loss as a means of improving sl   OSA (obstructive sleep apnea) 09/18/2013   Osteoarthrosis 12/09/2012   Osteopenia of multiple sites 10/12/2007   Qualifier: Diagnosis of  By: Nils Pyle CMA (AAMA), Leisha     Osteoporosis    Palpitations 07/14/2018   Personal history of colonic polyps 10/07/2009   tubular adenoma   Tachycardia, paroxysmal ([place]) 07/04/2018   Urinary tract infection    Vitamin B12 deficiency    Vitamin D deficiency 12/09/2012   Overview:  Overview:  IMPRESSION: Appropiate labs done today.  We will send the results and adjust treatment as needed. Bone density discussed. There has  been an improvement in her bone density comparing the one in 2009 from the one 2011. From osteoporotic she went to osteopenic. She did take Actonel 3 years ago for several years and stopped it due to GI side effects. We will continue mantaining f    Past Surgical History: Past Surgical History:  Procedure Laterality Date   ABDOMINAL HYSTERECTOMY     ABDOMINAL HYSTERECTOMY     CATARACT EXTRACTION Right    CHOLECYSTECTOMY     COLONOSCOPY WITH PROPOFOL N/A 11/21/2014   Procedure: COLONOSCOPY WITH PROPOFOL;  Surgeon: Irene Shipper, MD;  Location: [place];  Service: Endoscopy;  Laterality: N/A;   FRACTURE SURGERY     GALLBLADDER SURGERY  1986   LIGAMENT REPAIR Left 04/18/2013   Procedure: Triangular Fibrocartilage complex open  repair;  Surgeon: Jolyn Nap, MD;  Location: [place];  Service: Orthopedics;  Laterality: Left;   OOPHORECTOMY     OPEN REDUCTION INTERNAL FIXATION (ORIF) DISTAL RADIAL FRACTURE Left 04/18/2013   Procedure: LEFT OPEN REDUCTION INTERNAL FIXATION (ORIF) DISTAL RADIAL FRACTURE;  Surgeon: Jolyn Nap, MD;  Location: [place];  Service: Orthopedics;  Laterality: Left;   ORIF ELBOW FRACTURE Left 01/24/2018   Procedure: OPEN REDUCTION INTERNAL FIXATION (ORIF) DISTAL HUMERUS  ELBOW/OLECRANON OSTEOTOMY FRACTURE;  Surgeon: [name], MD;  Location: [place];  Service: Orthopedics;  Laterality: Left;    Current Medications: Current Meds  Medication Sig   AMBULATORY NON FORMULARY MEDICATION Take 10 mg by mouth 3 (three) times daily before meals. Medication Name: Domperidone   Ascorbic Acid (VITAMIN C) 1000 MG tablet Take 1,000 mg by mouth daily.   aspirin EC 81 MG tablet Take 81 mg by mouth daily.   budesonide-formoterol (SYMBICORT) 160-4.5 MCG/ACT inhaler Inhale 2 puffs into the lungs 2 (two) times daily.   calcium carbonate (OS-CAL - DOSED IN  MG OF ELEMENTAL CALCIUM) 1250 (500 Ca) MG tablet Take 1 tablet by mouth 2 (two) times daily with a meal.   Cholecalciferol 25 MCG (1000 UT) tablet Take 2,000 Units by mouth daily.   Cranberry 1000 MG CAPS Take 1,000 mg by mouth daily.   denosumab (PROLIA) 60 MG/ML SOSY injection Inject 60 mg into the skin every 6 (six) months.   diltiazem (CARDIZEM) 30 MG tablet Take 1 tablet (30 mg total) by mouth as needed (for HR above 100).   Flaxseed, Linseed, (FLAX SEED OIL) 1000 MG CAPS Take 2,000 mg by mouth daily.   gabapentin (NEURONTIN) 600 MG tablet Take 0.5 tablets by mouth 2 (two) times daily.   LORazepam (ATIVAN) 0.5 MG tablet Take 0.5 mg by mouth as needed for anxiety.    pantoprazole (PROTONIX) 40 MG tablet Take 1 tablet by mouth 2 (two) times daily. Gastroparesis   Probiotic CAPS Take 1 capsule by mouth 3 (three) times a week.   Tiotropium Bromide Monohydrate (SPIRIVA RESPIMAT) 2.5 MCG/ACT AERS INHALE 2 SPRAY(S) BY MOUTH ONCE DAILY INTO LUNGS   vitamin B-12 (CYANOCOBALAMIN) 1000 MCG tablet Take 2,000 mcg by mouth daily.   [DISCONTINUED] diltiazem (CARDIZEM CD) 240 MG 24 hr capsule Take 1 capsule (240 mg total) by mouth daily. Please keep upcoming appointment for future refills.     Allergies:    Clindamycin/lincomycin, Bactrim [sulfamethoxazole-trimethoprim], Doxycycline, and Elemental sulfur   Social History: Social History   Socioeconomic History   Marital status: Single    Spouse name: Not on file   Number of children: 1   Years of education: Not on file   Highest education level: Not on file  Occupational History   Occupation: Realtor    Employer: [employer]  Tobacco Use   Smoking status: Former    Packs/day: 1.00    Years: 25.00    Total pack years: 25.00    Types: Cigarettes    Quit date: 05/18/2009    Years since quitting: 12.6   Smokeless tobacco: Never  Vaping Use   Vaping Use: Never used  Substance and Sexual Activity   Alcohol use: Yes    Alcohol/week: 2.0  standard drinks of alcohol    Types: 2 Glasses of wine per week    Comment: 2 glasses of wine each night   Drug use: No   Sexual activity: Not on file  Other Topics Concern  Not on file  Social History Narrative   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    She lives with son.  She is divorced.         Social Determinants of Health   Financial Resource Strain: Not on file  Food Insecurity: Not on file  Transportation Needs: Not on file  Physical Activity: Not on file  Stress: Not on file  Social Connections: Not on file     Family History: The patient's family history includes COPD in her mother; Colitis in her son; Colon cancer in her cousin; Diabetes in her brother; Diabetes type II in her son; Emphysema in her mother; Other in her father; Tremor in her maternal aunt and mother.  ROS:   All other ROS reviewed and negative. Pertinent positives noted in the HPI.     EKGs/Labs/Other Studies Reviewed:   The following studies were personally reviewed by me today:  EKG:  EKG is ordered today.  The ekg ordered today demonstrates normal sinus rhythm heart rate 75, nonspecific intraventricular conduction delay (unchanged from prior), and was personally reviewed by me.   [place] 08/04/2020 Impression: 1. Brief ectopic atrial tachycardia episodes detected (7 episodes in 7 days; longest 9.8 seconds). 2. Symptoms occurred with normal sinus rhythm.  3. Rare ectopy.   Recent Labs: 05/06/2021: BUN 7; Creatinine, Ser 0.47; Hemoglobin 13.2; Platelets 410.0; Potassium 4.5; Pro B Natriuretic peptide (BNP) 45.0; Sodium 129   Recent Lipid Panel No results found for: "CHOL", "TRIG", "HDL", "CHOLHDL", "VLDL", "LDLCALC", "LDLDIRECT"  Physical Exam:   VS:  BP 124/72   Pulse 75   Ht 5' 5.5" (1.664 m)   Wt 153 lb 6.4 oz (69.6 kg)   SpO2 96%   BMI 25.14 kg/m    Wt Readings from Last 3 Encounters:  12/26/21 153 lb 6.4 oz (69.6 kg)  09/12/21 160 lb (72.6 kg)  06/23/21 164 lb 6.4  oz (74.6 kg)    General: Well nourished, well developed, in no acute distress Head: Atraumatic, normal size  Eyes: PEERLA, EOMI  Neck: Supple, no JVD Endocrine: No thryomegaly Cardiac: Normal S1, S2; RRR; 2 out of 6 holosystolic murmur Lungs: Clear to auscultation bilaterally, no wheezing, rhonchi or rales  Abd: Soft, nontender, no hepatomegaly  Ext: No edema, pulses 2+ Musculoskeletal: No deformities, BUE and BLE strength normal and equal Skin: Warm and dry, no rashes   Neuro: Alert and oriented to person, place, time, and situation, CNII-XII grossly intact, no focal deficits  Psych: Normal mood and affect   ASSESSMENT:   Virginia Burns is a 76 y.o. female who presents for the following: 1. Tachycardia   2. Ectopic atrial tachycardia (HCC)   3. SOB (shortness of breath) on exertion   4. Coronary artery calcification seen on CT scan   5. Essential hypertension   6. Murmur     PLAN:   1. Tachycardia 2. Ectopic atrial tachycardia (HCC) -History of brief ectopic atrial tachycardia.  Had several episodes this weekend of rapid heartbeat sensation.  Unclear if she was in arrhythmia.  Symptoms have resolved.  She will continue her diltiazem 240 mg daily.  We will give her prescription for short acting diltiazem 30 mg as needed.  I have also encouraged her to purchase a cardia mobile device.  This will let us know if she is actually in an arrhythmia or maintaining sinus rhythm.  3. SOB (shortness of breath) on exertion -Secondary to lung disease.  No changes.  4.  Coronary artery calcification seen on CT scan -No symptoms of angina.  LDL cholesterol acceptable.  HDL cholesterol is very good.  5. Essential hypertension -Well-controlled.  6. Murmur -Recheck echocardiogram.  Echo in 2020 was unremarkable.  She does have a 2 out of 6 holosystolic murmur.  Disposition: Return in about 6 months (around 06/28/2022).  Medication Adjustments/Labs and Tests Ordered: Current medicines are  reviewed at length with the patient today.  Concerns regarding medicines are outlined above.  Orders Placed This Encounter  Procedures   EKG 12-Lead   ECHOCARDIOGRAM COMPLETE   Meds ordered this encounter  Medications   diltiazem (CARDIZEM) 30 MG tablet    Sig: Take 1 tablet (30 mg total) by mouth as needed (for HR above 100).    Dispense:  90 tablet    Refill:  1   diltiazem (CARDIZEM CD) 240 MG 24 hr capsule    Sig: Take 1 capsule (240 mg total) by mouth daily. Please keep upcoming appointment for future refills.    Dispense:  90 capsule    Refill:  3    Patient Instructions  Medication Instructions:  Take Cardizem as needed every 4 hours if the HR is above 100 mg.  *If you need a refill on your cardiac medications before your next appointment, please call your pharmacy*   Testing/Procedures: Echocardiogram ([facility]) - Your physician has requested that you have an echocardiogram. Echocardiography is a painless test that uses sound waves to create images of your heart. It provides your doctor with information about the size and shape of your heart and how well your heart's chambers and valves are working. This procedure takes approximately one hour. There are no restrictions for this procedure.     Follow-Up: At [facility], you and your health needs are our priority.  As part of our continuing mission to provide you with exceptional heart care, we have created designated Provider Care Teams.  These Care Teams include your primary Cardiologist (physician) and Advanced Practice Providers (APPs -  Physician Assistants and Nurse Practitioners) who all work together to provide you with the care you need, when you need it.  We recommend signing up for the patient portal called "MyChart".  Sign up information is provided on this After Visit Summary.  MyChart is used to connect with patients for Virtual Visits (Telemedicine).  Patients are able to view lab/test results,  encounter notes, upcoming appointments, etc.  Non-urgent messages can be sent to your provider as well.   To learn more about what you can do with MyChart, go to [email or web address].    Your next appointment:   6 month(s)  The format for your next appointment:   In Person  Provider:   Eleonore Chiquito, MD              Time Spent with Patient: I have spent a total of 35 minutes with patient reviewing hospital notes, telemetry, EKGs, labs and examining the patient as well as establishing an assessment and plan that was discussed with the patient.  > 50% of time was spent in direct patient care.  Signed, Addison Naegeli. Audie Box, MD, [place]  [address], [place], [place] [address] [phone]  12/26/2021 11:57 AM

## 2021-12-26 ENCOUNTER — Encounter: Payer: Self-pay | Admitting: Cardiovascular Disease

## 2021-12-26 ENCOUNTER — Ambulatory Visit: Payer: PPO | Admitting: Cardiovascular Disease

## 2021-12-26 VITALS — BP 124/72 | HR 75 | Ht 65.5 in | Wt 153.4 lb

## 2021-12-26 DIAGNOSIS — R000 Tachycardia, unspecified: Secondary | ICD-10-CM

## 2021-12-26 DIAGNOSIS — R0602 Shortness of breath: Secondary | ICD-10-CM

## 2021-12-26 DIAGNOSIS — I10 Essential (primary) hypertension: Secondary | ICD-10-CM

## 2021-12-26 DIAGNOSIS — I471 Supraventricular tachycardia: Secondary | ICD-10-CM

## 2021-12-26 DIAGNOSIS — I2510 Atherosclerotic heart disease of native coronary artery without angina pectoris: Secondary | ICD-10-CM | POA: Diagnosis not present

## 2021-12-26 DIAGNOSIS — R011 Cardiac murmur, unspecified: Secondary | ICD-10-CM

## 2021-12-26 MED ORDER — DILTIAZEM HCL 30 MG PO TABS: 30.0000 mg | ORAL_TABLET | ORAL | 1 refills | Status: AC | PRN

## 2021-12-26 MED ORDER — DILTIAZEM HCL ER COATED BEADS 240 MG PO CP24: 240.0000 mg | ORAL_CAPSULE | Freq: Every day | ORAL | 3 refills | Status: DC

## 2021-12-26 NOTE — Patient Instructions (Signed)
Medication Instructions:  Take Cardizem as needed every 4 hours if the HR is above 100 mg.  *If you need a refill on your cardiac medications before your next appointment, please call your pharmacy*   Testing/Procedures: Echocardiogram ([facility]) - Your physician has requested that you have an echocardiogram. Echocardiography is a painless test that uses sound waves to create images of your heart. It provides your doctor with information about the size and shape of your heart and how well your heart's chambers and valves are working. This procedure takes approximately one hour. There are no restrictions for this procedure.     Follow-Up: At [facility], you and your health needs are our priority.  As part of our continuing mission to provide you with exceptional heart care, we have created designated Provider Care Teams.  These Care Teams include your primary Cardiologist (physician) and Advanced Practice Providers (APPs -  Physician Assistants and Nurse Practitioners) who all work together to provide you with the care you need, when you need it.  We recommend signing up for the patient portal called "MyChart".  Sign up information is provided on this After Visit Summary.  MyChart is used to connect with patients for Virtual Visits (Telemedicine).  Patients are able to view lab/test results, encounter notes, upcoming appointments, etc.  Non-urgent messages can be sent to your provider as well.   To learn more about what you can do with MyChart, go to [email or web address].    Your next appointment:   6 month(s)  The format for your next appointment:   In Person  Provider:   Eleonore Chiquito, MD

## 2022-01-06 ENCOUNTER — Ambulatory Visit (HOSPITAL_BASED_OUTPATIENT_CLINIC_OR_DEPARTMENT_OTHER)
Admission: RE | Admit: 2022-01-06 | Discharge: 2022-01-06 | Disposition: A | Discharge status: Home / Self Care | Payer: PPO | Source: Ambulatory Visit | Attending: Cardiovascular Disease | Admitting: Cardiovascular Disease

## 2022-01-06 DIAGNOSIS — R011 Cardiac murmur, unspecified: Secondary | ICD-10-CM | POA: Diagnosis present

## 2022-01-06 LAB — ECHOCARDIOGRAM COMPLETE
AR max vel: 2.07 cm2
AV Area VTI: 2.19 cm2
AV Area mean vel: 2.06 cm2
AV Mean grad: 5 mmHg
AV Peak grad: 9.1 mmHg
Ao pk vel: 1.51 m/s
Area-P 1/2: 2.85 cm2
P 1/2 time: 456 msec
S' Lateral: 2.3 cm

## 2022-01-06 NOTE — Progress Notes (Signed)
  Echocardiogram 2D Echocardiogram has been performed.  Merrie Roof F 01/06/2022, 1:44 PM

## 2022-01-07 ENCOUNTER — Encounter: Payer: Self-pay | Admitting: Cardiovascular Disease

## 2022-03-13 ENCOUNTER — Other Ambulatory Visit: Payer: Self-pay | Admitting: Pulmonary Disease

## 2022-03-13 ENCOUNTER — Telehealth: Payer: Self-pay | Admitting: Pulmonary Disease

## 2022-03-13 MED ORDER — SPIRIVA RESPIMAT 2.5 MCG/ACT IN AERS: INHALATION_SPRAY | RESPIRATORY_TRACT | 5 refills | Status: DC

## 2022-03-13 NOTE — Telephone Encounter (Signed)
Called and spoke with patient. She needed a refill on her Spiriva. Advised her that I would go ahead and send in the refill for her. She verbalized understanding.   Nothing further needed at time for call.

## 2022-05-13 ENCOUNTER — Ambulatory Visit: Payer: PPO | Admitting: Nurse Practitioner

## 2022-05-13 ENCOUNTER — Telehealth: Payer: Self-pay | Admitting: Internal Medicine

## 2022-05-13 NOTE — Telephone Encounter (Signed)
Patient was scheduled today (12/27) with Nevin Bloodgood for her medication refill OV.  However, Nevin Bloodgood is out sick and patient had to reschedule to 1/24.  She needs her Domperidone refilled and asked if you could call her in enough to last her until her visit.  Please call and advise her if you'll be able to do this for her.  Thank you.

## 2022-05-14 MED ORDER — AMBULATORY NON FORMULARY MEDICATION: 10.0000 mg | Freq: Three times a day (TID) | 1 refills | Status: DC

## 2022-05-14 NOTE — Telephone Encounter (Signed)
Spoke with patient and told her I would send in enough Domperidone to get her through till her appointment next month.

## 2022-06-10 ENCOUNTER — Ambulatory Visit: Payer: PPO | Admitting: Nurse Practitioner

## 2022-06-10 ENCOUNTER — Encounter: Payer: Self-pay | Admitting: Nurse Practitioner

## 2022-06-10 VITALS — BP 130/80 | HR 89 | Ht 65.5 in | Wt 148.4 lb

## 2022-06-10 DIAGNOSIS — K3184 Gastroparesis: Secondary | ICD-10-CM | POA: Diagnosis not present

## 2022-06-10 NOTE — Progress Notes (Addendum)
Assessment    Patient profile:  Virginia Burns is a 77 y.o. female known to Dr. Henrene Pastor . 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 See PMH /PSH for additional history  # 77 yo female with gastroparesis maintained on Domperidone TID. She recently had medication refilled but was told she needed follow up appt.  Doing well.   # Chronic GERD. Asymptomatic on BID Pantoprazole. Sometimes takes only one a day but gets may get pyrosis.   # History of adenomatous colon polyps. Two small tubular adenomas removed in July 2016. Saw Dr Henrene Pastor in July 2021 and decision was made not to proceed with future surveillance colononoscopies given advanced age and advanced lung disease. No blood in stool.   # COPD, on chronic 02.   Plan   Continue Domperidone 10 mg 30 minutes prior to meals three times daily. Renal function normal in November 2023.   HPI    Chief complaint: Needs refill on domperidone  Ms Meller feels okay. She recently called for a refill on Domperidone. Refill given but advised to make an appt. She is taking Domperidone prior to meals TID.    Labs:   04/03/22 Care Everywhere  Albumin 3.5 - 5.0 G/DL 5.0   Total Bilirubin 0.1 - 1.2 MG/DL 0.7 Patients taking eltrombopag at doses >/= 100 mg daily may show falsely elevated values of 10% or greater.  Alkaline Phosphatase 25 - 125 IU/L or U/L 58   AST (SGOT) 5 - 40 IU/L or U/L 21   ALT (SGPT) 5 - 50 IU/L or U/L 14   Anion Gap 4 - 14 MMOL/L 11   Est. GFR >=60 ML/MIN/1.73 M*2 >90     4.4 - 11.0 x 10*3/uL 9.2   RBC 4.10 - 5.10 x 10*6/uL 4.50  Hemoglobin 12.3 - 15.3 G/DL 14.0  Hematocrit 35.9 - 44.6 % 42.3  MCV 80.0 - 96.0 FL 93.9  MCH 27.5 - 33.2 PG 31.1  MCHC 33.0 - 37.0 G/DL 33.1  RDW 12.3 - 17.0 % 14.1  Platelets 150 - 450 X 10*3/uL 392    Past  Medical History:  Diagnosis Date   Adjustment disorder with anxiety 08/17/2013   Allergic rhinitis    Anxiety state 08/17/2013   Overview:  IMPRESSION: hx of anxiety, lost a friend and has friend in ICU, using the ativan prn. had refilled 07/27/13   Aortic ejection murmur 07/14/2018   Benign neoplasm of ascending colon 12/07/2014   Benign neoplasm of descending colon 12/07/2014   Centrilobular emphysema ([place]) 12/07/2014   Chronic hyponatremia 10/05/2018   Last Assessment & Plan:  Formatting of this note is different from the original. Recent Labs    10/05/18 0007 10/06/18 0504 10/07/18 0733  NA 132* 131* 135   Baseline ~ 132   -stable   Chronic respiratory failure ([place]) 03/04/2015   Closed fracture of sacrum and coccyx ([place]) 10/03/2018   Last Assessment & Plan:  Formatting of this note might be different from the original. Patient presented with progressively worsening lower back pain and inability to ambulate  MR: Acute fracture of the right greater than left sacral ala and S2 segment. CT: Nondisplaced, vertical and transverse acute sacral insufficiency Fractures  -s/p perQ fixation on 5/20  --Management per ortho primary team: P   Complication of anesthesia    Versed- hard time working - "dinging for days" -  Colonoscopy   Constipation 12/09/2012   Formatting of this note might be different from the original. IMPRESSION: increased constipation: trial of amitiza, to push fiber, can use mirlax prn.  eat 4 prunes a day. call prn. has had colonoscopy in past. is due next year for f/u   COPD (chronic obstructive pulmonary disease) ([place]) 02/10/2013   COPD (chronic obstructive pulmonary disease) with chronic bronchitis ([place]) 02/10/2013   Overnight pulse oximetry shows desaturations greater than 5 minutes at a time less than 88%.  Order signed for nocturnal O2 at 2 L/m February 21, 2016  Overview:  Overnight pulse oximetry shows desaturations greater than 5 minutes at a time less than 88%.  Order signed for nocturnal  O2 at 2 L/m February 21, 2016 Overview:  Overnight pulse oximetry shows desaturations greater than 5 minutes at a time l   COPD (chronic obstructive pulmonary disease) with emphysema (HCC)    Decreased mobility 01/30/2020   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 therapy 12/07/2014   Diverticulosis of colon 10/12/2007   Qualifier: Diagnosis of  By: Nils Pyle CMA (AAMA), Mearl Latin     Essential tremor 05/22/2013   Gastroparesis    GERD (gastroesophageal reflux disease)    GERD with esophagitis 10/12/2007   Qualifier: Diagnosis of  By: Nils Pyle CMA (AAMA), Mearl Latin    Overview:  Overview:  Qualifier: Diagnosis of  By: Nils Pyle CMA Deborra Medina), Mearl Latin  Overview:  Overview:  Qualifier: Diagnosis of  By: Nils Pyle CMA Deborra Medina), Leisha    History of tobacco abuse 09/18/2013   Humerus distal fracture 01/24/2018   Hx of colonic polyps    Hypertension    Hypertension, benign 07/19/2007   Qualifier: Diagnosis of  By: Garen Grams    Overview:  Overview:  STORY: The goal for blood pressure is less than 140/90.  If you are checking your blood pressure at home, please record it and bring it to your next office visit. 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  I have reviewed the recent sleep study results with the patient.  We discussed how sleep apnea can affect various health problems including risks for hypertension, cardiovascular disease, and diabetes.  We also discussed how sleep disruption can increase risks for accident, such as while driving.  Weight loss as a means of improving sl   OSA  (obstructive sleep apnea) 09/18/2013   Osteoarthrosis 12/09/2012   Osteopenia of multiple sites 10/12/2007   Qualifier: Diagnosis of  By: Nils Pyle CMA (AAMA), Leisha     Osteoporosis    Palpitations 07/14/2018   Personal history of colonic polyps 10/07/2009   tubular adenoma   Tachycardia, paroxysmal ([place]) 07/04/2018   Urinary tract infection    Vitamin B12 deficiency    Vitamin D deficiency 12/09/2012   Overview:  Overview:  IMPRESSION: Appropiate labs done today. We will send the results and adjust treatment as needed. Bone density discussed. There has  been an improvement in her bone density comparing the one in 2009 from the one 2011. From osteoporotic she went to osteopenic. She did take Actonel 3 years ago for  several years and stopped it due to GI side effects. We will continue mantaining f    Past Surgical History:  Procedure Laterality Date   ABDOMINAL HYSTERECTOMY     ABDOMINAL HYSTERECTOMY     CATARACT EXTRACTION Right    CHOLECYSTECTOMY     COLONOSCOPY WITH PROPOFOL N/A 11/21/2014   Procedure: COLONOSCOPY WITH PROPOFOL;  Surgeon: Irene Shipper, MD;  Location: [place];  Service: Endoscopy;  Laterality: N/A;   FRACTURE SURGERY     GALLBLADDER SURGERY  1986   LIGAMENT REPAIR Left 04/18/2013   Procedure: Triangular Fibrocartilage complex open repair;  Surgeon: Jolyn Nap, MD;  Location: [place];  Service: Orthopedics;  Laterality: Left;   OOPHORECTOMY     OPEN REDUCTION INTERNAL FIXATION (ORIF) DISTAL RADIAL FRACTURE Left 04/18/2013   Procedure: LEFT OPEN REDUCTION INTERNAL FIXATION (ORIF) DISTAL RADIAL FRACTURE;  Surgeon: Jolyn Nap, MD;  Location: [place];  Service: Orthopedics;  Laterality: Left;   ORIF ELBOW FRACTURE Left 01/24/2018   Procedure: OPEN REDUCTION INTERNAL FIXATION (ORIF) DISTAL HUMERUS  ELBOW/OLECRANON OSTEOTOMY FRACTURE;  Surgeon: [name], MD;  Location: [place];  Service: Orthopedics;  Laterality: Left;    Current  Medications, Allergies, Family History and Social History were reviewed in [employer] record.     Current Outpatient Medications  Medication Sig Dispense Refill   AMBULATORY NON FORMULARY MEDICATION Take 10 mg by mouth 3 (three) times daily before meals. Medication Name: Domperidone 270 tablet 1   Ascorbic Acid (VITAMIN C) 1000 MG tablet Take 1,000 mg by mouth daily.     aspirin EC 81 MG tablet Take 81 mg by mouth daily.     azithromycin (ZITHROMAX) 250 MG tablet Take 250-500 mg by mouth See admin instructions. Take '500mg'$  (2 tablets) on the first day and then '250mg'$  (1 tablet) every day after.     budesonide-formoterol (SYMBICORT) 160-4.5 MCG/ACT inhaler Inhale 2 puffs into the lungs 2 (two) times daily. 1 Inhaler 5   calcium carbonate (OS-CAL - DOSED IN MG OF ELEMENTAL CALCIUM) 1250 (500 Ca) MG tablet Take 1 tablet by mouth 2 (two) times daily with a meal.     cephALEXin (KEFLEX) 500 MG capsule Take 1 capsule (500 mg total) by mouth 4 (four) times daily. 20 capsule 0   Cholecalciferol 25 MCG (1000 UT) tablet Take 2,000 Units by mouth daily.     Cranberry 1000 MG CAPS Take 1,000 mg by mouth daily.     denosumab (PROLIA) 60 MG/ML SOSY injection Inject 60 mg into the skin every 6 (six) months.     diltiazem (CARDIZEM CD) 240 MG 24 hr capsule Take 1 capsule (240 mg total) by mouth daily. Please keep upcoming appointment for future refills. 90 capsule 3   diltiazem (CARDIZEM) 30 MG tablet Take 1 tablet (30 mg total) by mouth as needed (for HR above 100). 90 tablet 1   Flaxseed, Linseed, (FLAX SEED OIL) 1000 MG CAPS Take 2,000 mg by mouth daily.     gabapentin (NEURONTIN) 600 MG tablet Take 0.5 tablets by mouth 2 (two) times daily.     LORazepam (ATIVAN) 0.5 MG tablet Take 0.5 mg by mouth as needed for anxiety.      pantoprazole (PROTONIX) 40 MG tablet Take 1 tablet by mouth 2 (two) times daily. Gastroparesis     predniSONE (DELTASONE) 10 MG tablet Take 10-30 mg by mouth See  admin instructions. Take 3 tablets daily for 3 days, then 2  tablets daily for 3 days, then one tablet daily for 3 days.     Probiotic CAPS Take 1 capsule by mouth 3 (three) times a week.     Tiotropium Bromide Monohydrate (SPIRIVA RESPIMAT) 2.5 MCG/ACT AERS INHALE 2 SPRAY(S) BY MOUTH ONCE DAILY INTO LUNGS 4 g 5   vitamin B-12 (CYANOCOBALAMIN) 1000 MCG tablet Take 2,000 mcg by mouth daily.     No current facility-administered medications for this visit.    Review of Systems: No chest pain. No shortness of breath. No urinary complaints.    Physical Exam  Wt Readings from Last 3 Encounters:  12/26/21 153 lb 6.4 oz (69.6 kg)  09/12/21 160 lb (72.6 kg)  06/23/21 164 lb 6.4 oz (74.6 kg)    BP 130/80   Pulse 89   Ht 5' 5.5" (1.664 m)   Wt 148 lb 6.4 oz (67.3 kg)   SpO2 97% Comment: on 2L of o2  BMI 24.32 kg/m  Constitutional:  Generally well appearing female in no acute distress. Wearing portable 02 Psychiatric: Pleasant. Normal mood and affect. Behavior is normal. EENT: Pupils normal.  Conjunctivae are normal. No scleral icterus. Neck supple.  Cardiovascular: Normal rate, regular rhythm. Murmur is present.  Pulmonary/chest: Effort normal and breath sounds normal. No wheezing, rales or rhonchi. Abdominal: Limited exam in chair. Abdomen is soft, nondistended, nontender. Bowel sounds active throughout. There are no masses palpable.  Neurological: Alert and oriented to person place and time. Neuro: No involuntary muscle movements or lip smacking Musculoskeletal: No edema Skin: Skin is warm and dry. No rashes noted.  Tye Savoy, NP  06/10/2022, 8:31 AM

## 2022-06-10 NOTE — Patient Instructions (Addendum)
Call when need another refill Domperidone.   _______________________________________________________  If your blood pressure at your visit was 140/90 or greater, please contact your primary care physician to follow up on this.  _______________________________________________________  If you are age 76 or older, your body mass index should be between 23-30. Your Body mass index is 24.32 kg/m. If this is out of the aforementioned range listed, please consider follow up with your Primary Care Provider.  If you are age 75 or younger, your body mass index should be between 19-25. Your Body mass index is 24.32 kg/m. If this is out of the aformentioned range listed, please consider follow up with your Primary Care Provider.   __________________________________________________________  The [facility] providers would like to encourage you to use [facility] to communicate with providers for non-urgent requests or questions.  Due to long hold times on the telephone, sending your provider a message by [facility] may be a faster and more efficient way to get a response.  Please allow 48 business hours for a response.  Please remember that this is for non-urgent requests.    Due to recent changes in healthcare laws, you may see the results of your imaging and laboratory studies on MyChart before your provider has had a chance to review them.  We understand that in some cases there may be results that are confusing or concerning to you. Not all laboratory results come back in the same time frame and the provider may be waiting for multiple results in order to interpret others.  Please give [place] 48 hours in order for your provider to thoroughly review all the results before contacting the office for clarification of your results.     Thank you for choosing me and [facility].  Tye Savoy, np.

## 2022-06-10 NOTE — Progress Notes (Signed)
Noted  

## 2022-06-23 NOTE — Progress Notes (Signed)
Cardiology Office Note:   Date:  06/26/2022  NAME:  SHAMIEKA PRZYWARA    MRN: [id number] DOB:  October 30, 1945   PCP:  Berkley Harvey, NP  Cardiologist:  None  Electrophysiologist:  None   Referring MD: Berkley Harvey, NP   Chief Complaint  Patient presents with   Follow-up        History of Present Illness:   NOELE CIACCIA is a 77 y.o. female with a hx of COPD, hypertension, GERD, atrial tachycardia presents for follow-up.  She reports low energy.  Recent lab work shows she is not anemic.  Available in care everywhere through the [facility] system.  She denies any chest pain.  No rapid heartbeat sensation.  She is doing well on diltiazem.  We have never documented any atrial fibrillation.  Her blood pressure is slightly elevated but acceptable.  Denies any chest pain.  She does have severe COPD.  She is chronically short of breath.  Problem List 1. COPD -2L O2 -moderate to severe 2. HTN 3. GERD 4. T chol 207, HDL 88, TG 71, LDL 105 5.  Former Tobacco abuse -20 pack years  6. Ectopic atrial tachycardia -Brief ectopic atrial tachycardia episodes detected (longest 9.8 seconds) -sx improved on diltiazem  7. Coronary calcifications on chest CT  Past Medical History: Past Medical History:  Diagnosis Date   Adjustment disorder with anxiety 08/17/2013   Allergic rhinitis    Anxiety state 08/17/2013   Overview:  IMPRESSION: hx of anxiety, lost a friend and has friend in ICU, using the ativan prn. had refilled 07/27/13   Aortic ejection murmur 07/14/2018   Benign neoplasm of ascending colon 12/07/2014   Benign neoplasm of descending colon 12/07/2014   Centrilobular emphysema ([place]) 12/07/2014   Chronic hyponatremia 10/05/2018   Last Assessment & Plan:  Formatting of this note is different from the original. Recent Labs    10/05/18 0007 10/06/18 0504 10/07/18 0733  NA 132* 131* 135   Baseline ~ 132   -stable   Chronic respiratory failure ([place]) 03/04/2015   Closed fracture of sacrum and coccyx  ([place]) 10/03/2018   Last Assessment & Plan:  Formatting of this note might be different from the original. Patient presented with progressively worsening lower back pain and inability to ambulate  MR: Acute fracture of the right greater than left sacral ala and S2 segment. CT: Nondisplaced, vertical and transverse acute sacral insufficiency Fractures  -s/p perQ fixation on 5/20  --Management per ortho primary team: P   Complication of anesthesia    Versed- hard time working - "dinging for days" - Colonoscopy   Constipation 12/09/2012   Formatting of this note might be different from the original. IMPRESSION: increased constipation: trial of amitiza, to push fiber, can use mirlax prn.  eat 4 prunes a day. call prn. has had colonoscopy in past. is due next year for f/u   COPD (chronic obstructive pulmonary disease) ([place]) 02/10/2013   COPD (chronic obstructive pulmonary disease) with chronic bronchitis 02/10/2013   Overnight pulse oximetry shows desaturations greater than 5 minutes at a time less than 88%.  Order signed for nocturnal O2 at 2 L/m February 21, 2016  Overview:  Overnight pulse oximetry shows desaturations greater than 5 minutes at a time less than 88%.  Order signed for nocturnal O2 at 2 L/m February 21, 2016 Overview:  Overnight pulse oximetry shows desaturations greater than 5 minutes at a time l   COPD (chronic obstructive pulmonary disease) with emphysema ([place])  Decreased mobility 01/30/2020   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 therapy 12/07/2014   Diverticulosis of colon 10/12/2007   Qualifier: Diagnosis of  By: Nils Pyle CMA (AAMA), Mearl Latin     Essential tremor 05/22/2013   Gastroparesis    GERD (gastroesophageal reflux disease)    GERD with esophagitis 10/12/2007   Qualifier: Diagnosis of  By: Nils Pyle CMA (AAMA), Mearl Latin    Overview:  Overview:  Qualifier: Diagnosis of  By: Nils Pyle CMA Deborra Medina), Mearl Latin  Overview:  Overview:  Qualifier: Diagnosis of  By: Nils Pyle CMA Deborra Medina), Leisha    History of tobacco  abuse 09/18/2013   Humerus distal fracture 01/24/2018   Hx of colonic polyps    Hypertension    Hypertension, benign 07/19/2007   Qualifier: Diagnosis of  By: Garen Grams    Overview:  Overview:  STORY: The goal for blood pressure is less than 140/90.  If you are checking your blood pressure at home, please record it and bring it to your next office visit. 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  I have reviewed the recent sleep study results with the patient.  We discussed how sleep apnea can affect various health problems including risks for hypertension, cardiovascular disease, and diabetes.  We also discussed how sleep disruption can increase risks for accident, such as while driving.  Weight loss as a means of improving sl   OSA (obstructive sleep apnea) 09/18/2013   Osteoarthrosis 12/09/2012   Osteopenia of multiple sites 10/12/2007   Qualifier: Diagnosis of  By: Nils Pyle CMA (AAMA), Leisha     Osteoporosis    Palpitations 07/14/2018   Personal history of colonic polyps 10/07/2009   tubular adenoma   Tachycardia, paroxysmal ([place]) 07/04/2018   Urinary tract infection    Vitamin B12 deficiency    Vitamin D deficiency 12/09/2012   Overview:  Overview:  IMPRESSION: Appropiate labs done today. We will send the results and adjust treatment as needed. Bone density discussed. There has  been an improvement in her bone density comparing the one in 2009 from the one 2011. From osteoporotic she went to osteopenic. She did take Actonel 3 years ago for several years  and stopped it due to GI side effects. We will continue mantaining f    Past Surgical History: Past Surgical History:  Procedure Laterality Date   ABDOMINAL HYSTERECTOMY     ABDOMINAL HYSTERECTOMY     CATARACT EXTRACTION Right    CHOLECYSTECTOMY     COLONOSCOPY WITH PROPOFOL N/A 11/21/2014   Procedure: COLONOSCOPY WITH PROPOFOL;  Surgeon: Irene Shipper, MD;  Location: [facility];  Service: Endoscopy;  Laterality: N/A;   FRACTURE SURGERY     GALLBLADDER SURGERY  1986   LIGAMENT REPAIR Left 04/18/2013   Procedure: Triangular Fibrocartilage complex open repair;  Surgeon: Jolyn Nap, MD;  Location: [facility];  Service: Orthopedics;  Laterality: Left;   OOPHORECTOMY     OPEN REDUCTION INTERNAL FIXATION (ORIF) DISTAL RADIAL FRACTURE Left 04/18/2013   Procedure: LEFT OPEN REDUCTION INTERNAL FIXATION (ORIF) DISTAL RADIAL FRACTURE;  Surgeon: Rayvon Char  Grandville Silos, MD;  Location: [facility];  Service: Orthopedics;  Laterality: Left;   ORIF ELBOW FRACTURE Left 01/24/2018   Procedure: OPEN REDUCTION INTERNAL FIXATION (ORIF) DISTAL HUMERUS  ELBOW/OLECRANON OSTEOTOMY FRACTURE;  Surgeon: [name], MD;  Location: [facility];  Service: Orthopedics;  Laterality: Left;    Current Medications: Current Meds  Medication Sig   AMBULATORY NON FORMULARY MEDICATION Take 10 mg by mouth 3 (three) times daily before meals. Medication Name: Domperidone   Ascorbic Acid (VITAMIN C) 1000 MG tablet Take 1,000 mg by mouth daily.   aspirin EC 81 MG tablet Take 81 mg by mouth daily.   budesonide-formoterol (SYMBICORT) 160-4.5 MCG/ACT inhaler Inhale 2 puffs into the lungs 2 (two) times daily.   calcium carbonate (OS-CAL - DOSED IN MG OF ELEMENTAL CALCIUM) 1250 (500 Ca) MG tablet Take 1 tablet by mouth 2 (two) times daily with a meal.   Cholecalciferol 25 MCG (1000 UT) tablet Take 2,000 Units by mouth daily.   Cranberry 1000 MG CAPS Take 1,000 mg by mouth daily.   denosumab (PROLIA) 60 MG/ML  SOSY injection Inject 60 mg into the skin every 6 (six) months.   diltiazem (CARDIZEM CD) 240 MG 24 hr capsule Take 1 capsule (240 mg total) by mouth daily. Please keep upcoming appointment for future refills.   diltiazem (CARDIZEM) 30 MG tablet Take 1 tablet (30 mg total) by mouth as needed (for HR above 100).   Flaxseed, Linseed, (FLAX SEED OIL) 1000 MG CAPS Take 2,000 mg by mouth daily.   gabapentin (NEURONTIN) 600 MG tablet Take 0.5 tablets by mouth 2 (two) times daily.   LORazepam (ATIVAN) 0.5 MG tablet Take 0.5 mg by mouth as needed for anxiety.    pantoprazole (PROTONIX) 40 MG tablet Take 1 tablet by mouth 2 (two) times daily. Gastroparesis   Probiotic CAPS Take 1 capsule by mouth 3 (three) times a week.   Tiotropium Bromide Monohydrate (SPIRIVA RESPIMAT) 2.5 MCG/ACT AERS INHALE 2 SPRAY(S) BY MOUTH ONCE DAILY INTO LUNGS   vitamin B-12 (CYANOCOBALAMIN) 1000 MCG tablet Take 2,000 mcg by mouth daily.     Allergies:    Clindamycin/lincomycin, Bactrim [sulfamethoxazole-trimethoprim], Doxycycline, and Elemental sulfur   Social History: Social History   Socioeconomic History   Marital status: Single    Spouse name: Not on file   Number of children: 1   Years of education: Not on file   Highest education level: Not on file  Occupational History   Occupation: Realtor    Employer: [employer]  Tobacco Use   Smoking status: Former    Packs/day: 1.00    Years: 25.00    Total pack years: 25.00    Types: Cigarettes    Quit date: 05/18/2009    Years since quitting: 13.1   Smokeless tobacco: Never  Vaping Use   Vaping Use: Never used  Substance and Sexual Activity   Alcohol use: Yes    Alcohol/week: 2.0 standard drinks of alcohol    Types: 2 Glasses of wine per week    Comment: 2 glasses of wine each night   Drug use: No   Sexual activity: Not on file  Other Topics Concern   Not on file  Social History Narrative   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     She lives with son.  She is divorced.         Social Determinants of Health   Financial Resource Strain: Not on file  Food  Insecurity: Not on file  Transportation Needs: Not on file  Physical Activity: Not on file  Stress: Not on file  Social Connections: Not on file     Family History: The patient's family history includes COPD in her mother; Colitis in her son; Colon cancer in her cousin; Diabetes in her brother; Diabetes type II in her son; Emphysema in her mother; Other in her father; Tremor in her maternal aunt and mother. There is no history of Esophageal cancer, Liver cancer, or Pancreatic cancer.  ROS:   All other ROS reviewed and negative. Pertinent positives noted in the HPI.     EKGs/Labs/Other Studies Reviewed:   The following studies were personally reviewed by me today:  TTE 01/06/2022  1. Left ventricular ejection fraction, by estimation, is 60 to 65%. The  left ventricle has normal function. The left ventricle has no regional  wall motion abnormalities. Left ventricular diastolic parameters are  consistent with Grade I diastolic  dysfunction (impaired relaxation).   2. Right ventricular systolic function is normal. The right ventricular  size is normal. There is normal pulmonary artery systolic pressure.   3. Left atrial size was mildly dilated.   4. The mitral valve is normal in structure. Mild mitral valve  regurgitation. No evidence of mitral stenosis.   5. The aortic valve is normal in structure. Aortic valve regurgitation is  mild. No aortic stenosis is present.   6. The inferior vena cava is normal in size with greater than 50%  respiratory variability, suggesting right atrial pressure of 3 mmHg.   Recent Labs: No results found for requested labs within last 365 days.   Recent Lipid Panel No results found for: "CHOL", "TRIG", "HDL", "CHOLHDL", "VLDL", "LDLCALC", "LDLDIRECT"  Physical Exam:   VS:  BP (!) 148/74   Pulse 79   Ht 5' 0.5" (1.537 m)    Wt 145 lb 3.2 oz (65.9 kg)   SpO2 96%   BMI 27.89 kg/m    Wt Readings from Last 3 Encounters:  06/26/22 145 lb 3.2 oz (65.9 kg)  06/10/22 148 lb 6.4 oz (67.3 kg)  12/26/21 153 lb 6.4 oz (69.6 kg)    General: Well nourished, well developed, in no acute distress Head: Atraumatic, normal size  Eyes: PEERLA, EOMI  Neck: Supple, no JVD Endocrine: No thryomegaly Cardiac: Normal S1, S2; RRR; no murmurs, rubs, or gallops Lungs: Clear to auscultation bilaterally, no wheezing, rhonchi or rales  Abd: Soft, nontender, no hepatomegaly  Ext: No edema, pulses 2+ Musculoskeletal: No deformities, BUE and BLE strength normal and equal Skin: Warm and dry, no rashes   Neuro: Alert and oriented to person, place, time, and situation, CNII-XII grossly intact, no focal deficits  Psych: Normal mood and affect   ASSESSMENT:   SHAMESHA SUH is a 77 y.o. female who presents for the following: 1. Ectopic atrial tachycardia   2. Coronary artery calcification seen on CT scan   3. Essential hypertension     PLAN:   1. Ectopic atrial tachycardia -Well-controlled on diltiazem.  No documentation of A-fib.  Echo was normal.  She will continue diltiazem 240 mg daily.  She will continue 30 mg of diltiazem as needed if heart rate is above 100.  2. Coronary artery calcification seen on CT scan -Coronary calcification seen on chest CT.  Most recent LDL cholesterol 105.  She is wanting to avoid statin medications.  HDL is high as well.  This is good.  3. Essential hypertension -Continue diltiazem.  No change to medications today.  Instructed her to keep an eye on her blood pressure.  Disposition: Return in about 1 year (around 06/27/2023).  Medication Adjustments/Labs and Tests Ordered: Current medicines are reviewed at length with the patient today.  Concerns regarding medicines are outlined above.  No orders of the defined types were placed in this encounter.  No orders of the defined types were placed in  this encounter.   Patient Instructions    Follow-Up: At [facility], you and your health needs are our priority.  As part of our continuing mission to provide you with exceptional heart care, we have created designated Provider Care Teams.  These Care Teams include your primary Cardiologist (physician) and Advanced Practice Providers (APPs -  Physician Assistants and Nurse Practitioners) who all work together to provide you with the care you need, when you need it.  We recommend signing up for the patient portal called "MyChart".  Sign up information is provided on this After Visit Summary.  MyChart is used to connect with patients for Virtual Visits (Telemedicine).  Patients are able to view lab/test results, encounter notes, upcoming appointments, etc.  Non-urgent messages can be sent to your provider as well.   To learn more about what you can do with MyChart, go to [email or web address].    Your next appointment:   12 month(s)  Provider:   Eleonore Chiquito MD      Time Spent with Patient: I have spent a total of 25 minutes with patient reviewing hospital notes, telemetry, EKGs, labs and examining the patient as well as establishing an assessment and plan that was discussed with the patient.  > 50% of time was spent in direct patient care.  Signed, Addison Naegeli. Audie Box, MD, [facility]  [address] [place], [place] [address] [phone]  06/26/2022 1:42 PM

## 2022-06-26 ENCOUNTER — Ambulatory Visit: Payer: PPO | Attending: Cardiovascular Disease | Admitting: Cardiovascular Disease

## 2022-06-26 ENCOUNTER — Encounter: Payer: Self-pay | Admitting: Cardiovascular Disease

## 2022-06-26 VITALS — BP 148/74 | HR 79 | Ht 60.5 in | Wt 145.2 lb

## 2022-06-26 DIAGNOSIS — I10 Essential (primary) hypertension: Secondary | ICD-10-CM

## 2022-06-26 DIAGNOSIS — I2510 Atherosclerotic heart disease of native coronary artery without angina pectoris: Secondary | ICD-10-CM

## 2022-06-26 DIAGNOSIS — I4719 Other supraventricular tachycardia: Secondary | ICD-10-CM | POA: Diagnosis not present

## 2022-06-26 NOTE — Patient Instructions (Signed)
   Follow-Up: At [facility], you and your health needs are our priority.  As part of our continuing mission to provide you with exceptional heart care, we have created designated Provider Care Teams.  These Care Teams include your primary Cardiologist (physician) and Advanced Practice Providers (APPs -  Physician Assistants and Nurse Practitioners) who all work together to provide you with the care you need, when you need it.  We recommend signing up for the patient portal called "MyChart".  Sign up information is provided on this After Visit Summary.  MyChart is used to connect with patients for Virtual Visits (Telemedicine).  Patients are able to view lab/test results, encounter notes, upcoming appointments, etc.  Non-urgent messages can be sent to your provider as well.   To learn more about what you can do with MyChart, go to [email or web address].    Your next appointment:   12 month(s)  Provider:   Eleonore Chiquito MD

## 2022-07-29 ENCOUNTER — Telehealth: Payer: Self-pay | Admitting: Primary Care

## 2022-07-29 NOTE — Telephone Encounter (Signed)
Pt was unable to send a message on MyChart so she had to make it as a appointment request. Her message to nurse Volanda Napoleon is below .[name] Please Advise   "Hi Virginia Burns, Medicare will no longer cover my symbicort. Did appeal and denied. I tried breo and trelegy and neither work well. I am almost out of symbicort.  I am willing to try them but would like a few samples before getting expensive medicine and not able to use. I really don't need to come in but hope you can give me some sample for a friend to pick up for me. Please let me know. Thank you. Virginia Burns [phone]"

## 2022-07-30 NOTE — Telephone Encounter (Signed)
Spoke with pt who states her insurance is no longer covering Symbicort and that PCP had completed both PA and appeal both of which were denied. Pt states she is willing to try Trelegy if Dr. Halford Chessman says it is ok. Dr. Halford Chessman please advise. Also pt would like samples if Dr. Halford Chessman says it is ok.

## 2022-08-02 NOTE — Telephone Encounter (Signed)
Can we have pharmacy team check to see which other inhalers are on her insurance formulary list first please.

## 2022-08-03 NOTE — Telephone Encounter (Signed)
Can we run a ticket on patients insurance to see what's covered  Please route this message back to triage

## 2022-08-06 ENCOUNTER — Other Ambulatory Visit (HOSPITAL_COMMUNITY): Payer: Self-pay

## 2022-08-06 MED ORDER — FLUTICASONE-SALMETEROL 250-50 MCG/ACT IN AEPB: 1.0000 | INHALATION_SPRAY | Freq: Two times a day (BID) | RESPIRATORY_TRACT | 6 refills | Status: DC

## 2022-08-06 NOTE — Telephone Encounter (Signed)
Advised patient on what insurance is preferring. I have sent in script for generic Advair. Nothing is further needed

## 2022-08-06 NOTE — Telephone Encounter (Signed)
Per test claims alternative ICS/LABA covered are as follows:  Generic Advair Diskus/Wixela- $5.00 Breo-$47.00  ICS/LABA/LAMA Trelegy- $47.00 Breztri- $47.00

## 2022-08-06 NOTE — Telephone Encounter (Signed)
Dr. Halford Chessman Please see message from pharmacy team below   Per test claims alternative ICS/LABA covered are as follows:  Generic Advair Diskus/Wixela- $5.00 Breo-$47.00   ICS/LABA/LAMA Trelegy- $47.00 Breztri- $47.00

## 2022-08-06 NOTE — Telephone Encounter (Signed)
Previously tried breztri and trelegy, but these weren't effective.    Please send script for fluticasone-salmeterol (generic advair) 250 one puff bid.  This will take the place of symbicort.  She is to continue spiriva respimat two puffs daily.

## 2022-08-17 ENCOUNTER — Other Ambulatory Visit: Payer: Self-pay | Admitting: Nurse Practitioner

## 2022-08-17 ENCOUNTER — Telehealth: Payer: Self-pay | Admitting: Pulmonary Disease

## 2022-08-17 DIAGNOSIS — E042 Nontoxic multinodular goiter: Secondary | ICD-10-CM

## 2022-08-17 NOTE — Telephone Encounter (Signed)
Voice message

## 2022-08-17 NOTE — Telephone Encounter (Signed)
Pt calling back in regards to getting Symbicort samples

## 2022-08-17 NOTE — Telephone Encounter (Signed)
We substituted Advair for her last inhaler due to Symbicort not covered and she feels like her chest is tight and she feels like she has to cough but can't. When she can it's a dry cough, does not feel like it is opening her up like it should.   Pls call to advise [phone]

## 2022-09-09 ENCOUNTER — Ambulatory Visit
Admission: RE | Admit: 2022-09-09 | Discharge: 2022-09-09 | Disposition: A | Discharge status: Home / Self Care | Payer: PPO | Source: Ambulatory Visit | Attending: Nurse Practitioner | Admitting: Nurse Practitioner

## 2022-09-09 DIAGNOSIS — E042 Nontoxic multinodular goiter: Secondary | ICD-10-CM

## 2022-09-18 ENCOUNTER — Other Ambulatory Visit: Payer: Self-pay | Admitting: Pulmonary Disease

## 2022-09-30 ENCOUNTER — Encounter: Payer: Self-pay | Admitting: Cardiovascular Disease

## 2022-10-02 NOTE — Telephone Encounter (Signed)
-----   Message from Sande Rives, MD sent at 10/01/2022  7:33 AM EDT ----- Regarding: Follow-up post hospital Can we get her a follow-up appoint with me next week?  If I am DOD just put her somewhere there.   Gerri Spore T. Flora Lipps, MD, [facility]  [facility]  [address] [place], [place] [address] [phone]  7:34 AM

## 2022-10-02 NOTE — Telephone Encounter (Signed)
Called patient, scheduled for next week DOD day- May 22nd at 10:30 AM.  Patient verbalized understanding, thankful for appointment.

## 2022-10-04 NOTE — Progress Notes (Unsigned)
Cardiology Office Note:   Date:  10/07/2022  NAME:  Virginia Burns    MRN: [id number] DOB:  1945-07-31   PCP:  Iona Hansen, NP  Cardiologist:  Reatha Harps, MD  Electrophysiologist:  None   Referring MD: Iona Hansen, NP   Chief Complaint  Patient presents with   Chest Pain    History of Present Illness:   Virginia Burns is a 77 y.o. female with a hx of COPD, A tach, HTN who presents for follow-up.  She was seen in the emergency room at [facility] on 09/28/2022 for chest pain.  Reports she woke up with pressure in her chest.  Symptoms lasted several hours.  Workup in [facility] showed negative troponins.  EKG with left bundle branch block which is likely chronic.  She has had a nonspecific IVCD in the past.  She underwent a nuclear medicine stress test which showed possibly a small area of ischemia but overall deemed low risk.  She was told this was not her heart.  Since that time, she reports she is having intermittent chest pressure.  Symptoms can occur every few days.  Last several minutes.  Not exertional.  Not alleviated by rest.  She reports it may be associated with belching.  She also reports her heart rate has increased.  She reports infrequent palpitations.  When she exerts herself her heart rate goes up and takes a while to come down.  She is worn monitors with no evidence of atrial fibrillation.  Her blood pressure slightly elevated today 146/74.  She still has a faint systolic murmur.  CV exam otherwise normal.  Continues to wear oxygen.  We did discuss coronary CTA for further evaluation.  She does have coronary calcium on her CT scan.  She is not wanting to take any statin medications.  Problem List 1. COPD -2L O2 -moderate to severe 2. HTN 3. GERD 4. T chol 207, HDL 88, TG 71, LDL 105 5.  Former Tobacco abuse -20 pack years  6. Ectopic atrial tachycardia -Brief ectopic atrial tachycardia episodes detected (longest 9.8 seconds) -sx improved on diltiazem  7.  Coronary calcifications on chest CT 8. LBBB  Past Medical History: Past Medical History:  Diagnosis Date   Adjustment disorder with anxiety 08/17/2013   Allergic rhinitis    Anxiety state 08/17/2013   Overview:  IMPRESSION: hx of anxiety, lost a friend and has friend in ICU, using the ativan prn. had refilled 07/27/13   Aortic ejection murmur 07/14/2018   Benign neoplasm of ascending colon 12/07/2014   Benign neoplasm of descending colon 12/07/2014   Centrilobular emphysema (HCC) 12/07/2014   Chronic hyponatremia 10/05/2018   Last Assessment & Plan:  Formatting of this note is different from the original. Recent Labs    10/05/18 0007 10/06/18 0504 10/07/18 0733  NA 132* 131* 135   Baseline ~ 132   -stable   Chronic respiratory failure (HCC) 03/04/2015   Closed fracture of sacrum and coccyx (HCC) 10/03/2018   Last Assessment & Plan:  Formatting of this note might be different from the original. Patient presented with progressively worsening lower back pain and inability to ambulate  MR: Acute fracture of the right greater than left sacral ala and S2 segment. CT: Nondisplaced, vertical and transverse acute sacral insufficiency Fractures  -s/p perQ fixation on 5/20  --Management per ortho primary team: P   Complication of anesthesia    Versed- hard time working - "dinging for days" -  Colonoscopy   Constipation 12/09/2012   Formatting of this note might be different from the original. IMPRESSION: increased constipation: trial of amitiza, to push fiber, can use mirlax prn.  eat 4 prunes a day. call prn. has had colonoscopy in past. is due next year for f/u   COPD (chronic obstructive pulmonary disease) (HCC) 02/10/2013   COPD (chronic obstructive pulmonary disease) with chronic bronchitis 02/10/2013   Overnight pulse oximetry shows desaturations greater than 5 minutes at a time less than 88%.  Order signed for nocturnal O2 at 2 L/m February 21, 2016  Overview:  Overnight pulse oximetry shows desaturations  greater than 5 minutes at a time less than 88%.  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 therapy 12/07/2014   Diverticulosis of colon 10/12/2007   Qualifier: Diagnosis of  By: Koleen Distance CMA (AAMA), Hulan Saas     Essential tremor 05/22/2013   Gastroparesis    GERD (gastroesophageal reflux disease)    GERD with esophagitis 10/12/2007   Qualifier: Diagnosis of  By: Koleen Distance CMA (AAMA), Hulan Saas    Overview:  Overview:  Qualifier: Diagnosis of  By: Koleen Distance CMA Duncan Dull), Hulan Saas  Overview:  Overview:  Qualifier: Diagnosis of  By: Koleen Distance CMA Duncan Dull), Leisha    History of tobacco abuse 09/18/2013   Humerus distal fracture 01/24/2018   Hx of colonic polyps    Hypertension    Hypertension, benign 07/19/2007   Qualifier: Diagnosis of  By: Cheri Guppy    Overview:  Overview:  STORY: The goal for blood pressure is less than 140/90.  If you are checking your blood pressure at home, please record it and bring it to your next office visit. 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  I have reviewed the recent sleep study results with the patient.  We discussed how sleep apnea can affect various health problems including risks for hypertension, cardiovascular disease, and diabetes.  We also discussed how sleep disruption can increase risks for accident,  such as while driving.  Weight loss as a means of improving sl   OSA (obstructive sleep apnea) 09/18/2013   Osteoarthrosis 12/09/2012   Osteopenia of multiple sites 10/12/2007   Qualifier: Diagnosis of  By: Koleen Distance CMA (AAMA), Leisha     Osteoporosis    Palpitations 07/14/2018   Personal history of colonic polyps 10/07/2009   tubular adenoma   Tachycardia, paroxysmal (HCC) 07/04/2018   Urinary tract infection    Vitamin B12 deficiency    Vitamin D deficiency 12/09/2012   Overview:  Overview:  IMPRESSION: Appropiate labs done today. We will send the results and adjust treatment as needed. Bone density discussed. There has  been an improvement in her bone density comparing the one in 2009 from the one 2011. From osteoporotic she went to osteopenic. She did take Actonel 3 years ago for several  years and stopped it due to GI side effects. We will continue mantaining f    Past Surgical History: Past Surgical History:  Procedure Laterality Date   ABDOMINAL HYSTERECTOMY     ABDOMINAL HYSTERECTOMY     CATARACT EXTRACTION Right    CHOLECYSTECTOMY     COLONOSCOPY WITH PROPOFOL N/A 11/21/2014   Procedure: COLONOSCOPY WITH PROPOFOL;  Surgeon: Hilarie Fredrickson, MD;  Location: [facility] ENDOSCOPY;  Service: Endoscopy;  Laterality: N/A;   FRACTURE SURGERY     GALLBLADDER SURGERY  1986   LIGAMENT REPAIR Left 04/18/2013   Procedure: Triangular Fibrocartilage complex open repair;  Surgeon: Jodi Marble, MD;  Location: [facility];  Service: Orthopedics;  Laterality: Left;   OOPHORECTOMY     OPEN REDUCTION INTERNAL FIXATION (ORIF) DISTAL RADIAL FRACTURE Left 04/18/2013   Procedure: LEFT OPEN REDUCTION INTERNAL FIXATION (ORIF) DISTAL RADIAL FRACTURE;  Surgeon: Jodi Marble, MD;  Location: [facility];  Service: Orthopedics;  Laterality: Left;   ORIF ELBOW FRACTURE Left 01/24/2018   Procedure: OPEN REDUCTION INTERNAL FIXATION (ORIF) DISTAL HUMERUS  ELBOW/OLECRANON OSTEOTOMY FRACTURE;  Surgeon:  Eldred Manges, MD;  Location: [facility] OR;  Service: Orthopedics;  Laterality: Left;    Current Medications: Current Meds  Medication Sig   AMBULATORY NON FORMULARY MEDICATION Take 10 mg by mouth 3 (three) times daily before meals. Medication Name: Domperidone   Ascorbic Acid (VITAMIN C) 1000 MG tablet Take 1,000 mg by mouth daily.   aspirin EC 81 MG tablet Take 81 mg by mouth daily.   budesonide-formoterol (SYMBICORT) 160-4.5 MCG/ACT inhaler Inhale 2 puffs into the lungs 2 (two) times daily.   calcium carbonate (OS-CAL - DOSED IN MG OF ELEMENTAL CALCIUM) 1250 (500 Ca) MG tablet Take 1 tablet by mouth 2 (two) times daily with a meal.   Cholecalciferol 25 MCG (1000 UT) tablet Take 2,000 Units by mouth daily.   Cranberry 1000 MG CAPS Take 1,000 mg by mouth daily.   Cranberry-Vitamin C-Probiotic (AZO CRANBERRY PO) Take by mouth 2 (two) times daily.   denosumab (PROLIA) 60 MG/ML SOSY injection Inject 60 mg into the skin every 6 (six) months.   diltiazem (CARDIZEM) 30 MG tablet Take 1 tablet (30 mg total) by mouth as needed (for HR above 100).   Flaxseed, Linseed, (FLAX SEED OIL) 1000 MG CAPS Take 2,000 mg by mouth daily.   fluticasone-salmeterol (ADVAIR) 250-50 MCG/ACT AEPB Inhale 1 puff into the lungs every 12 (twelve) hours.   gabapentin (NEURONTIN) 600 MG tablet Take 0.5 tablets by mouth 2 (two) times daily.   LORazepam (ATIVAN) 0.5 MG tablet Take 0.5 mg by mouth as needed for anxiety.    pantoprazole (PROTONIX) 40 MG tablet Take 1 tablet by mouth 2 (two) times daily. Gastroparesis   Probiotic CAPS Take 1 capsule by mouth 3 (three) times a week.   Tiotropium Bromide Monohydrate (SPIRIVA RESPIMAT) 2.5 MCG/ACT AERS INHALE 2 SPRAY(S) BY MOUTH ONCE DAILY   vitamin B-12 (CYANOCOBALAMIN) 1000 MCG tablet Take 2,000 mcg by mouth daily.   [DISCONTINUED] diltiazem (CARDIZEM CD) 240 MG 24 hr capsule Take 1 capsule (240 mg total) by mouth daily. Please keep upcoming appointment for future refills.    [DISCONTINUED] metoprolol tartrate (LOPRESSOR) 100 MG tablet Take 1 tablet (100mg ) TWO hours prior to CT scan     Allergies:    Clindamycin/lincomycin, Bactrim [sulfamethoxazole-trimethoprim], Doxycycline, and Elemental sulfur   Social History: Social History   Socioeconomic History   Marital status: Single    Spouse  name: Not on file   Number of children: 1   Years of education: Not on file   Highest education level: Not on file  Occupational History   Occupation: Realtor    Employer: [employer]  Tobacco Use   Smoking status: Former    Packs/day: 1.00    Years: 25.00    Additional pack years: 0.00    Total pack years: 25.00    Types: Cigarettes    Quit date: 05/18/2009    Years since quitting: 13.3   Smokeless tobacco: Never  Vaping Use   Vaping Use: Never used  Substance and Sexual Activity   Alcohol use: Yes    Alcohol/week: 2.0 standard drinks of alcohol    Types: 2 Glasses of wine per week    Comment: 2 glasses of wine each night   Drug use: No   Sexual activity: Not on file  Other Topics Concern   Not on file  Social History Narrative   She currently works as a Veterinary surgeon for [employer].    She lives with son.  She is divorced.         Social Determinants of Health   Financial Resource Strain: Not on file  Food Insecurity: Not on file  Transportation Needs: Not on file  Physical Activity: Not on file  Stress: Not on file  Social Connections: Not on file     Family History: The patient's family history includes COPD in her mother; Colitis in her son; Colon cancer in her cousin; Diabetes in her brother; Diabetes type II in her son; Emphysema in her mother; Other in her father; Tremor in her maternal aunt and mother. There is no history of Esophageal cancer, Liver cancer, or Pancreatic cancer.  ROS:   All other ROS reviewed and negative. Pertinent positives noted in the HPI.     EKGs/Labs/Other Studies Reviewed:   The following studies were  personally reviewed by me today:  EKG:  EKG is ordered today.  The ekg ordered today demonstrates NSR 87 bpm, LBBB, and was personally reviewed by me.   NM Stress 09/29/2022 ([place]) 1. Small area of mildly reversible perfusion involving the apical  aspect of the lateral wall of the left ventricle, potentially  attenuation due to the patient being imaged with the left upper  extremity by her side, though conceivably a small area of  pharmacologically induced ischemia could have a similar appearance.  Clinical correlation is advised.  2. No scintigraphic evidence of prior infarction.  3. Normal wall motion.  4. Left ventricular ejection fraction-58%   Recent Labs: No results found for requested labs within last 365 days.   Recent Lipid Panel No results found for: "CHOL", "TRIG", "HDL", "CHOLHDL", "VLDL", "LDLCALC", "LDLDIRECT"  Physical Exam:   VS:  BP (!) 146/74   Pulse 87   Ht 5' 0.5" (1.537 m)   Wt 137 lb 3.2 oz (62.2 kg)   SpO2 94%   BMI 26.35 kg/m    Wt Readings from Last 3 Encounters:  10/07/22 137 lb 3.2 oz (62.2 kg)  06/26/22 145 lb 3.2 oz (65.9 kg)  06/10/22 148 lb 6.4 oz (67.3 kg)    General: Well nourished, well developed, in no acute distress Head: Atraumatic, normal size  Eyes: PEERLA, EOMI  Neck: Supple, no JVD Endocrine: No thryomegaly Cardiac: Normal S1, S2; RRR; 2 out of 6 systolic ejection murmur Lungs: Diminished breath sounds bilaterally Abd: Soft, nontender, no hepatomegaly  Ext: No edema, pulses  2+ Musculoskeletal: No deformities, BUE and BLE strength normal and equal Skin: Warm and dry, no rashes   Neuro: Alert and oriented to person, place, time, and situation, CNII-XII grossly intact, no focal deficits  Psych: Normal mood and affect   ASSESSMENT:   Virginia Burns is a 77 y.o. female who presents for the following: 1. Precordial pain   2. Coronary artery calcification seen on CT scan   3. Ectopic atrial tachycardia   4. Essential  hypertension     PLAN:   1. Precordial pain 2. Coronary artery calcification seen on CT scan -Ongoing symptoms of chest discomfort.  Described as pressure.  Not exertional.  EKG with left bundle branch block which is chronic.  Recent nuclear medicine stress test in [facility] with small area of apical ischemia.  She does have coronary calcium.  This was deemed low risk.  She is still having symptoms.  I recommended coronary CTA for further evaluation.  She has mild coronary calcium on CT imaging.  I think she will get a good scan.  The only issue is her heart rate.  We will increase her diltiazem to 360 mg daily.  She will take this 2 hours before the scan.  She did have lab work yesterday through care everywhere.  She does not need repeat labs.  Her kidney function is normal.  She is not wanting to take any statin medications.  She has very good HDL cholesterol.  For now we will forego this.  She will follow-up with me in 4 months or sooner after coronary CTA.  3. Ectopic atrial tachycardia -Symptoms could be related to ectopic atrial tachycardia.  She never had any documented A-fib.  Increase her Cardizem to 360 mg daily.  4. Essential hypertension -Slightly elevated today.  Increase Cardizem to 360 mg daily.      Disposition: Return in about 4 months (around 02/07/2023).  Medication Adjustments/Labs and Tests Ordered: Current medicines are reviewed at length with the patient today.  Concerns regarding medicines are outlined above.  Orders Placed This Encounter  Procedures   CT CORONARY MORPH W/CTA COR W/SCORE W/CA W/CM &/OR WO/CM   EKG 12-Lead   Meds ordered this encounter  Medications   DISCONTD: metoprolol tartrate (LOPRESSOR) 100 MG tablet    Sig: Take 1 tablet (100mg ) TWO hours prior to CT scan    Dispense:  1 tablet    Refill:  0   diltiazem (CARDIZEM CD) 360 MG 24 hr capsule    Sig: Take 1 capsule (360 mg total) by mouth daily.    Dispense:  90 capsule    Refill:  3     Patient Instructions  Medication Instructions:  Increase Diltazem to 360 mg daily- take this two hours before your CT scan   *If you need a refill on your cardiac medications before your next appointment, please call your pharmacy*  Testing/Procedures: Coronary CTA-see instructions below  Follow-Up: At [facility], you and your health needs are our priority.  As part of our continuing mission to provide you with exceptional heart care, we have created designated Provider Care Teams.  These Care Teams include your primary Cardiologist (physician) and Advanced Practice Providers (APPs -  Physician Assistants and Nurse Practitioners) who all work together to provide you with the care you need, when you need it.  We recommend signing up for the patient portal called "MyChart".  Sign up information is provided on this After Visit Summary.  MyChart is used  to connect with patients for Virtual Visits (Telemedicine).  Patients are able to view lab/test results, encounter notes, upcoming appointments, etc.  Non-urgent messages can be sent to your provider as well.   To learn more about what you can do with MyChart, go to [email or web address].    Your next appointment:   4 month(s)  Provider:   Reatha Harps, MD     Other Instructions   Your cardiac CT will be scheduled at one of the below locations:   [facility] [address] [place], [place] [address] [phone]  If scheduled at [facility], please arrive at the [facility] and Children's Entrance (Entrance C2) of [facility] 30 minutes prior to test start time. You can use the FREE valet parking offered at entrance C (encouraged to control the heart rate for the test)  Proceed to the [facility] Radiology Department (first floor) to check-in and test prep.  All radiology patients and guests should use entrance C2 at [facility], accessed from [facility], even though the  hospital's physical address listed is [address]     Please follow these instructions carefully (unless otherwise directed):  On the Night Before the Test: Be sure to Drink plenty of water. Do not consume any caffeinated/decaffeinated beverages or chocolate 12 hours prior to your test. Do not take any antihistamines 12 hours prior to your test.  On the Day of the Test: Drink plenty of water until 1 hour prior to the test. Do not eat any food 1 hour prior to test. You may take your regular medications prior to the test.  Take metoprolol (Lopressor) two hours prior to test. If you take Furosemide/Hydrochlorothiazide/Spironolactone, please HOLD on the morning of the test. FEMALES- please wear underwire-free bra if available, avoid dresses & tight clothing   After the Test: Drink plenty of water. After receiving IV contrast, you may experience a mild flushed feeling. This is normal. On occasion, you may experience a mild rash up to 24 hours after the test. This is not dangerous. If this occurs, you can take Benadryl 25 mg and increase your fluid intake. If you experience trouble breathing, this can be serious. If it is severe call 911 IMMEDIATELY. If it is mild, please call our office. If you take any of these medications: Glipizide/Metformin, Avandament, Glucavance, please do not take 48 hours after completing test unless otherwise instructed.  We will call to schedule your test 2-4 weeks out understanding that some insurance companies will need an authorization prior to the service being performed.   For non-scheduling related questions, please contact the cardiac imaging nurse navigator should you have any questions/concerns: Rockwell Alexandria, Cardiac Imaging Nurse Navigator Larey Brick, Cardiac Imaging Nurse Navigator [facility] Direct Office Dial: [phone]   For scheduling needs, including cancellations and rescheduling, please call  [place], [phone].     Time Spent with Patient: I have spent a total of 35 minutes with patient reviewing hospital notes, telemetry, EKGs, labs and examining the patient as well as establishing an assessment and plan that was discussed with the patient.  > 50% of time was spent in direct patient care.  Signed, Lenna Gilford. Flora Lipps, MD, [facility]  [facility]  [address] [place], [place] [address] [phone]  10/07/2022 11:23 AM

## 2022-10-07 ENCOUNTER — Ambulatory Visit: Payer: PPO | Attending: Cardiovascular Disease | Admitting: Cardiovascular Disease

## 2022-10-07 ENCOUNTER — Encounter: Payer: Self-pay | Admitting: Cardiovascular Disease

## 2022-10-07 VITALS — BP 146/74 | HR 87 | Ht 60.5 in | Wt 137.2 lb

## 2022-10-07 DIAGNOSIS — I4719 Other supraventricular tachycardia: Secondary | ICD-10-CM | POA: Diagnosis not present

## 2022-10-07 DIAGNOSIS — R072 Precordial pain: Secondary | ICD-10-CM

## 2022-10-07 DIAGNOSIS — I2510 Atherosclerotic heart disease of native coronary artery without angina pectoris: Secondary | ICD-10-CM | POA: Diagnosis not present

## 2022-10-07 DIAGNOSIS — I10 Essential (primary) hypertension: Secondary | ICD-10-CM | POA: Diagnosis not present

## 2022-10-07 MED ORDER — DILTIAZEM HCL ER COATED BEADS 360 MG PO CP24: 360.0000 mg | ORAL_CAPSULE | Freq: Every day | ORAL | 3 refills | Status: DC

## 2022-10-07 MED ORDER — METOPROLOL TARTRATE 100 MG PO TABS: ORAL_TABLET | ORAL | 0 refills | Status: DC

## 2022-10-07 NOTE — Patient Instructions (Addendum)
Medication Instructions:  Increase Diltazem to 360 mg daily- take this two hours before your CT scan   *If you need a refill on your cardiac medications before your next appointment, please call your pharmacy*  Testing/Procedures: Coronary CTA-see instructions below  Follow-Up: At [facility], you and your health needs are our priority.  As part of our continuing mission to provide you with exceptional heart care, we have created designated Provider Care Teams.  These Care Teams include your primary Cardiologist (physician) and Advanced Practice Providers (APPs -  Physician Assistants and Nurse Practitioners) who all work together to provide you with the care you need, when you need it.  We recommend signing up for the patient portal called "MyChart".  Sign up information is provided on this After Visit Summary.  MyChart is used to connect with patients for Virtual Visits (Telemedicine).  Patients are able to view lab/test results, encounter notes, upcoming appointments, etc.  Non-urgent messages can be sent to your provider as well.   To learn more about what you can do with MyChart, go to [email or web address].    Your next appointment:   4 month(s)  Provider:   Reatha Harps, MD     Other Instructions   Your cardiac CT will be scheduled at one of the below locations:   [facility] [address] [place], [place] [address] [phone]  If scheduled at [facility], please arrive at the [facility] and Children's Entrance (Entrance C2) of [facility] 30 minutes prior to test start time. You can use the FREE valet parking offered at entrance C (encouraged to control the heart rate for the test)  Proceed to the [facility] Radiology Department (first floor) to check-in and test prep.  All radiology patients and guests should use entrance C2 at [facility], accessed from [facility], even though the hospital's physical address  listed is [address]     Please follow these instructions carefully (unless otherwise directed):  On the Night Before the Test: Be sure to Drink plenty of water. Do not consume any caffeinated/decaffeinated beverages or chocolate 12 hours prior to your test. Do not take any antihistamines 12 hours prior to your test.  On the Day of the Test: Drink plenty of water until 1 hour prior to the test. Do not eat any food 1 hour prior to test. You may take your regular medications prior to the test.  Take metoprolol (Lopressor) two hours prior to test. If you take Furosemide/Hydrochlorothiazide/Spironolactone, please HOLD on the morning of the test. FEMALES- please wear underwire-free bra if available, avoid dresses & tight clothing   After the Test: Drink plenty of water. After receiving IV contrast, you may experience a mild flushed feeling. This is normal. On occasion, you may experience a mild rash up to 24 hours after the test. This is not dangerous. If this occurs, you can take Benadryl 25 mg and increase your fluid intake. If you experience trouble breathing, this can be serious. If it is severe call 911 IMMEDIATELY. If it is mild, please call our office. If you take any of these medications: Glipizide/Metformin, Avandament, Glucavance, please do not take 48 hours after completing test unless otherwise instructed.  We will call to schedule your test 2-4 weeks out understanding that some insurance companies will need an authorization prior to the service being performed.   For non-scheduling related questions, please contact the cardiac imaging nurse navigator should you have  any questions/concerns: Rockwell Alexandria, Cardiac Imaging Nurse Navigator Larey Brick, Cardiac Imaging Nurse Navigator  Heart and Vascular Services Direct Office Dial: [phone]   For scheduling needs, including cancellations and rescheduling, please call [place], [phone].

## 2022-10-08 ENCOUNTER — Telehealth: Payer: Self-pay

## 2022-10-08 ENCOUNTER — Encounter: Payer: Self-pay | Admitting: Cardiovascular Disease

## 2022-10-08 NOTE — Telephone Encounter (Signed)
Spoke with Virginia Burns. Called pt to clarify, she should take Diltiazem 360 mg 2 hours before her CT scan instead of Metoprolol tartrate. The Metoprolol order was cancelled yesterday. Pt states she will call the pharmacy to put the Metoprolol back.

## 2022-10-16 ENCOUNTER — Other Ambulatory Visit: Payer: Self-pay | Admitting: Pulmonary Disease

## 2022-10-16 ENCOUNTER — Encounter: Payer: Self-pay | Admitting: Cardiovascular Disease

## 2022-10-22 ENCOUNTER — Telehealth (HOSPITAL_COMMUNITY): Payer: Self-pay | Admitting: *Deleted

## 2022-10-22 NOTE — Telephone Encounter (Signed)
Reaching out to patient to offer assistance regarding upcoming cardiac imaging study; pt verbalizes understanding of appt date/time, parking situation and where to check in, pre-test NPO status and medications ordered, and verified current allergies; name and call back number provided for further questions should they arise  Larey Brick RN Navigator Cardiac Imaging [facility] [phone] office [phone] cell  Patient to take her cardizem two hours prior to her cardiac CT scan.  She is aware to arrive at 11am.

## 2022-10-23 ENCOUNTER — Other Ambulatory Visit: Payer: Self-pay | Admitting: Cardiovascular Disease

## 2022-10-23 ENCOUNTER — Ambulatory Visit (HOSPITAL_COMMUNITY)
Admission: RE | Admit: 2022-10-23 | Discharge: 2022-10-23 | Disposition: A | Discharge status: Home / Self Care | Payer: PPO | Source: Ambulatory Visit | Attending: Cardiovascular Disease | Admitting: Cardiovascular Disease

## 2022-10-23 DIAGNOSIS — R072 Precordial pain: Secondary | ICD-10-CM | POA: Diagnosis not present

## 2022-10-23 DIAGNOSIS — I2510 Atherosclerotic heart disease of native coronary artery without angina pectoris: Secondary | ICD-10-CM

## 2022-10-23 DIAGNOSIS — I10 Essential (primary) hypertension: Secondary | ICD-10-CM

## 2022-10-23 DIAGNOSIS — I4719 Other supraventricular tachycardia: Secondary | ICD-10-CM

## 2022-10-23 MED ORDER — DILTIAZEM HCL 25 MG/5ML IV SOLN
5.0000 mg | Freq: Once | INTRAVENOUS | Status: AC
  Administered 2022-10-23: 5 mg via INTRAVENOUS

## 2022-10-23 MED ORDER — NITROGLYCERIN 0.4 MG SL SUBL
0.8000 mg | SUBLINGUAL_TABLET | Freq: Once | SUBLINGUAL | Status: AC
  Administered 2022-10-23: 0.8 mg via SUBLINGUAL

## 2022-10-23 MED ORDER — DILTIAZEM HCL 25 MG/5ML IV SOLN
INTRAVENOUS | Status: AC
  Filled 2022-10-23: qty 5

## 2022-10-23 MED ORDER — NITROGLYCERIN 0.4 MG SL SUBL
SUBLINGUAL_TABLET | SUBLINGUAL | Status: AC
  Filled 2022-10-23: qty 2

## 2022-10-23 NOTE — Progress Notes (Signed)
Patient presented for her cardiac CT scan. She took her 360mg  cardizem two hours prior to her scan as instructed. Her HR ranged from mid 60's to 80's.  I was advised by Dr. Rolly Salter to give patient IV 5mg  cardizem if her BP would support that amount and to scan retro.  On the scanner, patient was given 5mg  cardizem and nitroglcyerin.  Patient's HR was in the 80's-90's during her scan and BP dropped from 128/67 to 103/52. Dr. Izora Ribas called again and he recommends we abort the study today. He recommended re-scheduling with ivabradine.  Explained situation to patient. She verbalized understanding.  Patient was escorted out of department in a wheelchair without incident. Will also be sending message to ordering provider.

## 2022-10-26 ENCOUNTER — Other Ambulatory Visit (HOSPITAL_COMMUNITY): Payer: Self-pay | Admitting: Cardiovascular Disease

## 2022-10-26 ENCOUNTER — Telehealth (HOSPITAL_COMMUNITY): Payer: Self-pay | Admitting: Emergency Medicine

## 2022-10-26 ENCOUNTER — Encounter: Payer: Self-pay | Admitting: Cardiovascular Disease

## 2022-10-26 DIAGNOSIS — R079 Chest pain, unspecified: Secondary | ICD-10-CM

## 2022-10-26 MED ORDER — IVABRADINE HCL 5 MG PO TABS: 15.0000 mg | ORAL_TABLET | Freq: Once | ORAL | 0 refills | Status: AC

## 2022-10-26 NOTE — Telephone Encounter (Signed)
CCTA resched for 6/26 at 11:30 15mg  ivabradine sent to pharm   Will take dilt + ivab 2 hr prior to scan Rockwell Alexandria RN Navigator Cardiac Imaging [facility] Heart and Vascular Services [phone] Office  [phone] Cell

## 2022-11-01 ENCOUNTER — Encounter: Payer: Self-pay | Admitting: Cardiovascular Disease

## 2022-11-03 ENCOUNTER — Encounter: Payer: Self-pay | Admitting: Cardiovascular Disease

## 2022-11-10 ENCOUNTER — Telehealth (HOSPITAL_COMMUNITY): Payer: Self-pay | Admitting: *Deleted

## 2022-11-10 NOTE — Telephone Encounter (Signed)
Reaching out to patient to offer assistance regarding upcoming cardiac imaging study; pt verbalizes understanding of appt date/time, parking situation and where to check in, pre-test NPO status and medications ordered, and verified current allergies; name and call back number provided for further questions should they arise  Virginia Brick RN Navigator Cardiac Imaging [facility] [phone] office [phone] cell  Second attempt. Patient to take her daily cardizem and 15mg  ivabradine two hours prior to her cardiac CT scan.  She is aware to arrive at 11 am.

## 2022-11-11 ENCOUNTER — Ambulatory Visit (HOSPITAL_COMMUNITY)
Admission: RE | Admit: 2022-11-11 | Discharge: 2022-11-11 | Disposition: A | Discharge status: Home / Self Care | Payer: PPO | Source: Ambulatory Visit | Attending: Cardiovascular Disease | Admitting: Cardiovascular Disease

## 2022-11-11 DIAGNOSIS — I2510 Atherosclerotic heart disease of native coronary artery without angina pectoris: Secondary | ICD-10-CM

## 2022-11-11 DIAGNOSIS — R079 Chest pain, unspecified: Secondary | ICD-10-CM | POA: Diagnosis present

## 2022-11-11 DIAGNOSIS — I700 Atherosclerosis of aorta: Secondary | ICD-10-CM | POA: Insufficient documentation

## 2022-11-11 MED ORDER — NITROGLYCERIN 0.4 MG SL SUBL
0.8000 mg | SUBLINGUAL_TABLET | SUBLINGUAL | Status: DC | PRN
  Administered 2022-11-11: 0.8 mg via SUBLINGUAL

## 2022-11-11 MED ORDER — NITROGLYCERIN 0.4 MG SL SUBL
SUBLINGUAL_TABLET | SUBLINGUAL | Status: AC
  Filled 2022-11-11: qty 2

## 2022-11-11 MED ORDER — IOHEXOL 350 MG/ML SOLN
95.0000 mL | Freq: Once | INTRAVENOUS | Status: AC | PRN
  Administered 2022-11-11: 95 mL via INTRAVENOUS

## 2022-11-11 NOTE — Progress Notes (Signed)
Patient tolerated without distress 

## 2022-11-23 ENCOUNTER — Telehealth: Payer: Self-pay | Admitting: *Deleted

## 2022-11-23 MED ORDER — ROSUVASTATIN CALCIUM 10 MG PO TABS: 10.0000 mg | ORAL_TABLET | Freq: Every day | ORAL | 3 refills | Status: DC

## 2022-11-23 NOTE — Telephone Encounter (Signed)
-----   Message from Sande Rives, MD sent at 11/12/2022  8:10 AM EDT ----- Nonobstructive CAD.  She is on aspirin.  Will need to start a cholesterol medication.  Would recommend Crestor 10 mg daily.  I will send her a MyChart message about this.  She will keep her regular follow-up with me.  Gerri Spore T. Flora Lipps, MD, [facility]  [facility]  [address] [place], [place] [address] [phone]  8:08 AM

## 2022-11-23 NOTE — Telephone Encounter (Signed)
Called left detail message on secure voicemail per Dr Flora Lipps - start taking Rosuvastatin 10 mg daily at bedtime. Any question may call back. Medication e-sent tho pharmacy - 90 day supply   Patient has reviewed information through mychart.

## 2022-12-24 ENCOUNTER — Other Ambulatory Visit: Payer: Self-pay

## 2022-12-24 MED ORDER — AMBULATORY NON FORMULARY MEDICATION: 10.0000 mg | Freq: Three times a day (TID) | 3 refills | Status: DC

## 2023-02-04 ENCOUNTER — Ambulatory Visit: Payer: PPO | Admitting: Cardiovascular Disease

## 2023-06-10 ENCOUNTER — Telehealth: Payer: Self-pay | Admitting: Primary Care

## 2023-06-10 NOTE — Telephone Encounter (Signed)
Virginia Burns checking on fax sent for Spiriva inhaler. States did not get the whole fax. [name] phone number is [phone].

## 2023-06-16 NOTE — Telephone Encounter (Signed)
Spoke with Virginia Burns  She is asking if we approved or denied spiriva refill  I advised it's denied- have not seen pt since 2023 and according to her chart she sees Dr Su Monks for pulm now ([facility]) Nothing further needed

## 2023-07-25 NOTE — Progress Notes (Unsigned)
 Cardiology Office Note    Date:  07/25/2023  ID:  Virginia Burns, DOB 1945/09/11, MRN [id number] PCP:  Iona Hansen, NP  Cardiologist:  Reatha Harps, MD  Electrophysiologist:  None   Chief Complaint: ***  History of Present Illness: .    Virginia Burns is a 78 y.o. female with visit-pertinent history of COPD, hypertension, GERD, former tobacco abuse, ectopic atrial tachycardia, left bundle branch block, nonobstructive CAD on coronary CTA in 10/2022.  Patient has been followed by Dr. Flora Lipps.  She was last seen in 09/2022 for follow-up.  She had previously been seen in the emergency room at [facility] in 09/2022 for chest pain.  She reports she woke up with pressure in her chest, her symptoms lasted for several hours.  Her workup at [facility] showed negative troponins, EKG with left bundle branch block which was felt to likely be chronic.  She underwent nuclear medicine stress test which showed possibly a small area of ischemia but overall deemed low risk.  At her follow-up appointment with Dr. Bufford Buttner she noted intermittent chest pressure that occurred every few days.  She noted it lasted several minutes and was nonexertional and not alleviated by rest.  She thought it may be associated with belching.  She underwent coronary CTA on 11/11/2022 that indicated a coronary calcium score of 143, this is 66 percentile for age and sex matched control, LAD with mixed plaques proximal and mid with 30 to 49% mild stenosis, RCA was a large dominant artery that gives rise to PDA and PLA, minimal calcified distal plaque 0 to 24% stenosis noted to have nonobstructive disease.  She did have moderate aortic atherosclerosis.  It was recommended that she start on rosuvastatin 10 mg daily.  Today she presents for follow-up.  She reports that she  Nonobstructive CAD: coronary CTA on 11/11/2022 that indicated a coronary calcium score of 143, this is 66 percentile for age and sex matched control, LAD with mixed  plaques proximal and mid with 30 to 49% mild stenosis, RCA was a large dominant artery that gives rise to PDA and PLA, minimal calcified distal plaque 0 to 24% stenosis noted to have nonobstructive disease.  She did have moderate aortic atherosclerosis.  It was recommended that she start on rosuvastatin 10 mg daily. Today she reports  Continue  Reviewed ED precautions  Hypertension: Blood pressure today COPD: Noted to be moderate to severe, she is on 2 L of oxygen. She reports that her Ectopic atrial tachycardia: Noted to have brief ectopic atrial tachycardia, longest 9.8 seconds.  Her symptoms improved on diltiazem.  Today she reports Hyperlipidemia: Last lipid profile in 04/22/2023 indicated total cholesterol 212, triglycerides 74, HDL 89 and LDL 107. It was previously recommended that she start on rosuvastatin 10 mg daily?  Labwork independently reviewed: 07/20/23: Sodium 31, potassium 4.4, creatinine 0.66   ROS: .   *** denies chest pain, shortness of breath, lower extremity edema, fatigue, palpitations, melena, hematuria, hemoptysis, diaphoresis, weakness, presyncope, syncope, orthopnea, and PND.  All other systems are reviewed and otherwise negative.  Studies Reviewed: Marland Lepkowski    EKG:  EKG is ordered today, personally reviewed, demonstrating ***     CV Studies: Cardiac studies reviewed are outlined and summarized above. Otherwise please see EMR for full report. Cardiac Studies & Procedures   ______________________________________________________________________________________________     ECHOCARDIOGRAM  ECHOCARDIOGRAM COMPLETE 01/06/2022  Narrative ECHOCARDIOGRAM REPORT    Patient Name:   Virginia Burns Date of Exam:  01/06/2022 Medical Rec #:  [id number]         Height:       65.5 in Accession #:    [id number]        Weight:       153.4 lb Date of Birth:  [birth year]         BSA:          1.777 m Patient Age:    75 years          BP:           122/75 mmHg Patient Gender: F                  HR:           70 bpm. Exam Location:  [place]  Procedure: 2D Echo, Cardiac Doppler and Color Doppler  Indications:    Murmur  History:        Patient has prior history of Echocardiogram examinations, most recent 07/18/2018. COPD, Arrythmias:Tachycardia, Signs/Symptoms:Murmur; Risk Factors:Hypertension.  Sonographer:    Roosvelt Maser RDCS Referring Phys: [id number] [facility]  IMPRESSIONS   1. Left ventricular ejection fraction, by estimation, is 60 to 65%. The left ventricle has normal function. The left ventricle has no regional wall motion abnormalities. Left ventricular diastolic parameters are consistent with Grade I diastolic dysfunction (impaired relaxation). 2. Right ventricular systolic function is normal. The right ventricular size is normal. There is normal pulmonary artery systolic pressure. 3. Left atrial size was mildly dilated. 4. The mitral valve is normal in structure. Mild mitral valve regurgitation. No evidence of mitral stenosis. 5. The aortic valve is normal in structure. Aortic valve regurgitation is mild. No aortic stenosis is present. 6. The inferior vena cava is normal in size with greater than 50% respiratory variability, suggesting right atrial pressure of 3 mmHg.  FINDINGS Left Ventricle: Left ventricular ejection fraction, by estimation, is 60 to 65%. The left ventricle has normal function. The left ventricle has no regional wall motion abnormalities. The left ventricular internal cavity size was normal in size. There is borderline left ventricular hypertrophy. Left ventricular diastolic parameters are consistent with Grade I diastolic dysfunction (impaired relaxation).  Right Ventricle: The right ventricular size is normal. No increase in right ventricular wall thickness. Right ventricular systolic function is normal. There is normal pulmonary artery systolic pressure. The tricuspid regurgitant velocity is 2.47 m/s, and with an assumed  right atrial pressure of 3 mmHg, the estimated right ventricular systolic pressure is 27.4 mmHg.  Left Atrium: Left atrial size was mildly dilated.  Right [facility]rium: Right atrial size was normal in size.  Pericardium: There is no evidence of pericardial effusion.  Mitral Valve: The mitral valve is normal in structure. Mild mitral valve regurgitation. No evidence of mitral valve stenosis.  Tricuspid Valve: The tricuspid valve is normal in structure. Tricuspid valve regurgitation is mild . No evidence of tricuspid stenosis.  Aortic Valve: The aortic valve is normal in structure. Aortic valve regurgitation is mild. Aortic regurgitation PHT measures 456 msec. No aortic stenosis is present. Aortic valve mean gradient measures 5.0 mmHg. Aortic valve peak gradient measures 9.1 mmHg. Aortic valve area, by VTI measures 2.19 cm.  Pulmonic Valve: The pulmonic valve was normal in structure. Pulmonic valve regurgitation is not visualized. No evidence of pulmonic stenosis.  Aorta: The aortic root is normal in size and structure.  Venous: The inferior vena cava is normal in size with greater than 50% respiratory variability, suggesting right atrial pressure  of 3 mmHg.  IAS/Shunts: No atrial level shunt detected by color flow Doppler.   LEFT VENTRICLE PLAX 2D LVIDd:         3.60 cm   Diastology LVIDs:         2.30 cm   LV e' medial:    6.53 cm/s LV PW:         1.10 cm   LV E/e' medial:  11.2 LV IVS:        1.10 cm   LV e' lateral:   8.16 cm/s LVOT diam:     1.80 cm   LV E/e' lateral: 9.0 LV SV:         67 LV SV Index:   38 LVOT Area:     2.54 cm   RIGHT VENTRICLE RV Basal diam:  3.40 cm RV S prime:     12.30 cm/s TAPSE (M-mode): 2.6 cm  LEFT ATRIUM             Index        RIGHT ATRIUM           Index LA diam:        3.40 cm 1.91 cm/m   RA Area:     14.70 cm LA Vol (A2C):   59.5 ml 33.48 ml/m  RA Volume:   33.10 ml  18.62 ml/m LA Vol (A4C):   67.8 ml 38.15 ml/m LA Biplane Vol: 65.0  ml 36.57 ml/m AORTIC VALVE AV Area (Vmax):    2.07 cm AV Area (Vmean):   2.06 cm AV Area (VTI):     2.19 cm AV Vmax:           151.00 cm/s AV Vmean:          109.000 cm/s AV VTI:            0.305 m AV Peak Grad:      9.1 mmHg AV Mean Grad:      5.0 mmHg LVOT Vmax:         123.00 cm/s LVOT Vmean:        88.100 cm/s LVOT VTI:          0.263 m LVOT/AV VTI ratio: 0.86 AI PHT:            456 msec  AORTA Ao Root diam: 3.50 cm Ao Asc diam:  3.50 cm  MITRAL VALVE                TRICUSPID VALVE MV Area (PHT): 2.85 cm     TR Peak grad:   24.4 mmHg MV Decel Time: 266 msec     TR Vmax:        247.00 cm/s MV E velocity: 73.20 cm/s MV A velocity: 122.00 cm/s  SHUNTS MV E/A ratio:  0.60         Systemic VTI:  0.26 m Systemic Diam: 1.80 cm  Gypsy Balsam MD Electronically signed by Gypsy Balsam MD Signature Date/Time: 01/06/2022/4:41:44 PM    Final    MONITORS  LONG TERM MONITOR (3-14 DAYS) 07/31/2020  Narrative Enrollment 07/15/2020-07/22/2020 (6 days 23 hours). Patient had a min HR of 52 bpm (sinus bradycardia), max HR of 182 bpm (supraventricular tachycardia), and avg HR of 79 bpm (normal sinus rhythm). Predominant underlying rhythm was Sinus Rhythm. 7 Supraventricular Tachycardia runs occurred, the run with the fastest interval lasting 12 beats (4.6 seconds) with a max rate of 182 bpm, the longest lasting 17 beats (9.8 seconds) with an avg rate  of 104 bpm. Supraventricular Tachycardia episodes represent atrial tachycardia with variable block and aberrant conduction. Isolated SVEs were rare (<1.0%), SVE Couplets were rare (<1.0%), and SVE Triplets were rare (<1.0%). Isolated VEs were rare (<1.0%), VE Couplets were rare (<1.0%), and no VE Triplets were present. Diary summarized below:  07/15/20 05:52pm short of breath, fluttering/racing coincided with normal sinus rhythm 93 bpm. 07/16/20 09:59am fluttering/racing coincided with normal sinus rhythm 78 bpm. 07/20/20 09:45am  fluttering/racing coincided with sinus tachycardia 115 bpm. 07/20/20 04:50pm fluttering/racing coincided with sinus tachycardia 130 bpm. 07/22/20 10:38am short of breath, fluttering/racing coincided with sinus tachycardia 117 bpm.  Impression: 1. Brief ectopic atrial tachycardia episodes detected (7 episodes in 7 days; longest 9.8 seconds). 2. Symptoms occurred with normal sinus rhythm. 3. Rare ectopy.  Gerri Spore T. Flora Lipps, MD, [facility]  [facility] [address] [place], [place] [address] [phone] 7:56 PM   CT SCANS  CT CORONARY MORPH W/CTA COR W/SCORE 11/11/2022  Addendum 11/17/2022 10:57 PM ADDENDUM REPORT: 11/17/2022 22:54  EXAM: OVER-READ INTERPRETATION  CT CHEST  The following report is an over-read performed by radiologist Dr. Alcide Clever of [facility] on 11/17/2022. This over-read does not include interpretation of cardiac or coronary anatomy or pathology. The coronary calcium score/coronary CTA interpretation by the cardiologist is attached.  COMPARISON:  10/23/2022  FINDINGS: Cardiovascular: There are no significant extracardiac vascular findings.  Mediastinum/Nodes: There are no enlarged lymph nodes within the visualized mediastinum.  Lungs/Pleura: There is no pleural effusion. Mild scarring is noted in the bases bilaterally.  Upper abdomen: No significant findings in the visualized upper abdomen.  Musculoskeletal/Chest wall: No chest wall mass or suspicious osseous findings within the visualized chest.  IMPRESSION: No significant extracardiac findings within the visualized chest.   Electronically Signed By: Alcide Clever M.D. On: 11/17/2022 22:54  Narrative CLINICAL DATA:  78 year old with chest pain.  EXAM: Cardiac/Coronary  CTA  TECHNIQUE: The patient was scanned on a [employer].  FINDINGS: A 120 kV prospective scan was triggered in the descending thoracic aorta at 111 HU's. Axial  non-contrast 3 mm slices were carried out through the heart. The data set was analyzed on a dedicated work station and scored using the Agatson method. Gantry rotation speed was 250 msecs and collimation was .6 mm. 0.8 mg of sl NTG was given. The 3D data set was reconstructed in 5% intervals of the 67-82 % of the R-R cycle. Diastolic phases were analyzed on a dedicated work station using MPR, MIP and VRT modes. The patient received 80 cc of contrast.  Image quality: Good, motion artifact.  Aorta: Normal size. 38 mm. Moderate aortic atherosclerosis. No dissection.  Aortic Valve:  Trileaflet.  No calcifications.  Coronary Arteries:  Normal coronary origin.  Right dominance.  RCA is a large (4.60mm) dominant artery that gives rise to PDA and PLA. There is minimal calcified distal plaque, 0-24% stenosis.  Left main is a large artery that gives rise to LAD and LCX arteries.  LAD is a large vessel that has mixed plaque proximal and mid with 30-49% mild stenosis.  D1- Large with no stenosis  LCX is a non-dominant artery.  There is no plaque.  OM1- moderate sized, no plaque.  Other findings:  Normal pulmonary vein drainage into the left atrium.  Normal left atrial appendage without a thrombus.  Normal size of the pulmonary artery.  Thickened left ventricle.  Please see radiology report for non cardiac findings.  IMPRESSION: 1.  Coronary calcium score of 143. This was 13 percentile for age and sex matched control.  2. Normal coronary origin with right dominance.  3. LAD-mixed plaque proximal and mid with 30-49% mild stenosis. RCA is a large (4.59mm) dominant artery that gives rise to PDA and PLA. There is minimal calcified distal plaque, 0-24% stenosis. Non obstructive disease.  4. Moderate aortic atherosclerosis.  Electronically Signed: By: Donato Schultz M.D. On: 11/11/2022 14:52      ______________________________________________________________________________________________       Current Reported Medications:.    No outpatient medications have been marked as taking for the 07/26/23 encounter (Appointment) with Rip Harbour, NP.    Physical Exam:    VS:  There were no vitals taken for this visit.   Wt Readings from Last 3 Encounters:  10/07/22 137 lb 3.2 oz (62.2 kg)  06/26/22 145 lb 3.2 oz (65.9 kg)  06/10/22 148 lb 6.4 oz (67.3 kg)    GEN: Well nourished, well developed in no acute distress NECK: No JVD; No carotid bruits CARDIAC: ***RRR, no murmurs, rubs, gallops RESPIRATORY:  Clear to auscultation without rales, wheezing or rhonchi  ABDOMEN: Soft, non-tender, non-distended EXTREMITIES:  No edema; No acute deformity     Asessement and Plan:.     ***     Disposition: F/u with ***  Signed, Rip Harbour, NP

## 2023-07-26 ENCOUNTER — Encounter: Payer: Self-pay | Admitting: Cardiology

## 2023-07-26 ENCOUNTER — Ambulatory Visit: Payer: PPO | Attending: Cardiology | Admitting: Cardiology

## 2023-07-26 VITALS — BP 128/68 | HR 82 | Ht 65.5 in | Wt 128.0 lb

## 2023-07-26 DIAGNOSIS — R000 Tachycardia, unspecified: Secondary | ICD-10-CM

## 2023-07-26 DIAGNOSIS — I4719 Other supraventricular tachycardia: Secondary | ICD-10-CM | POA: Diagnosis not present

## 2023-07-26 DIAGNOSIS — I10 Essential (primary) hypertension: Secondary | ICD-10-CM | POA: Diagnosis not present

## 2023-07-26 DIAGNOSIS — I2510 Atherosclerotic heart disease of native coronary artery without angina pectoris: Secondary | ICD-10-CM

## 2023-07-26 DIAGNOSIS — R0602 Shortness of breath: Secondary | ICD-10-CM | POA: Diagnosis not present

## 2023-07-26 NOTE — Patient Instructions (Signed)
 Medication Instructions:  No changes *If you need a refill on your cardiac medications before your next appointment, please call your pharmacy*  Lab Work: No labs If you have labs (blood work) drawn today and your tests are completely normal, you will receive your results only by: MyChart Message (if you have MyChart) OR A paper copy in the mail If you have any lab test that is abnormal or we need to change your treatment, we will call you to review the results.  Testing/Procedures: No testing  Follow-Up: At [facility], you and your health needs are our priority.  As part of our continuing mission to provide you with exceptional heart care, we have created designated Provider Care Teams.  These Care Teams include your primary Cardiologist (physician) and Advanced Practice Providers (APPs -  Physician Assistants and Nurse Practitioners) who all work together to provide you with the care you need, when you need it.  We recommend signing up for the patient portal called "MyChart".  Sign up information is provided on this After Visit Summary.  MyChart is used to connect with patients for Virtual Visits (Telemedicine).  Patients are able to view lab/test results, encounter notes, upcoming appointments, etc.  Non-urgent messages can be sent to your provider as well.   To learn more about what you can do with MyChart, go to [email or web address].    Your next appointment:   6 month(s)  Provider:   Reatha Harps, MD     Other Instructions

## 2023-08-27 ENCOUNTER — Other Ambulatory Visit: Payer: Self-pay | Admitting: Cardiovascular Disease

## 2023-09-14 ENCOUNTER — Encounter: Payer: Self-pay | Admitting: Cardiovascular Disease

## 2023-09-15 ENCOUNTER — Telehealth: Payer: Self-pay

## 2023-09-15 NOTE — Telephone Encounter (Signed)
 Patient identification verified by 2 forms. Sims Duck, RN     Called and spoke to patient  Patient states:  - experiencing shortness of breath along with  higher heart rate.  - Sunday HR was 147 while sitting in bed.  - O2 sat during call is 92 on 3L of oxygen  - she has HX of COPD.  - she is taking the cardizem  30 mg as needed but the mediation takes a long time to work and she is still having breakthrough tachycardia some hours later. Wonders if dose needs to be increased.    Patient denies:  - CP, vision changes, one-sided weakness             Interventions/Plan: - No hx of afib/a-flutter, patient not anticoagulated.  - recommend patient contact pulmonology office to make an appt regarding oxygen  levels/COPD management.  - recommended patient be evaluated at ED for current O2 sats on max level of home 02. Patient declined to go to ED. Reviewed ED warning signs/precautions again.  - Encounter forwarded to primary cardiologist for review/recommendations.   Patient agrees with plan, no questions at this time

## 2023-09-15 NOTE — Telephone Encounter (Signed)
 Patient identification verified by 2 forms. Virginia Duck, RN     Called and spoke to patient.  This RN relayed provider recommendations as follows:  Harrold Lincoln, MD  You3 hours ago (12:15 PM)    Sounds COPD. Recommend ER evaluation or urgent care. Agree with pulmonary first.  Melodee Spruce T. Rolm Clos, MD, [facility]  [facility] [address] [place], [place] [address] [phone] 12:15 PM   Reviewed ED warning signs/precautions. No changes to medication at this time.  Patient agrees with plan, no questions at this time

## 2023-09-21 ENCOUNTER — Encounter: Payer: Self-pay | Admitting: Cardiovascular Disease

## 2023-09-21 NOTE — Telephone Encounter (Signed)
 Called pt. Offered a later appt. She was delighted, and took the later appt.

## 2023-09-23 ENCOUNTER — Ambulatory Visit: Admitting: Physician Assistant

## 2023-09-29 ENCOUNTER — Ambulatory Visit: Admitting: Student

## 2023-09-30 NOTE — Progress Notes (Unsigned)
 Cardiology Office Note    Patient Name: Virginia Burns Date of Encounter: 09/30/2023  Primary Care Provider:  Angelia Kelp, NP Primary Cardiologist:  Oneil Bigness, MD Primary Electrophysiologist: None   Past Medical History    Past Medical History:  Diagnosis Date   Adjustment disorder with anxiety 08/17/2013   Allergic rhinitis    Anxiety state 08/17/2013   Overview:  IMPRESSION: hx of anxiety, lost a friend and has friend in ICU, using the ativan  prn. had refilled 07/27/13   Aortic ejection murmur 07/14/2018   Benign neoplasm of ascending colon 12/07/2014   Benign neoplasm of descending colon 12/07/2014   Centrilobular emphysema (HCC) 12/07/2014   Chronic hyponatremia 10/05/2018   Last Assessment & Plan:  Formatting of this note is different from the original. Recent Labs    10/05/18 0007 10/06/18 0504 10/07/18 0733  NA 132* 131* 135   Baseline ~ 132   -stable   Chronic respiratory failure (HCC) 03/04/2015   Closed fracture of sacrum and coccyx (HCC) 10/03/2018   Last Assessment & Plan:  Formatting of this note might be different from the original. Patient presented with progressively worsening lower back pain and inability to ambulate  MR: Acute fracture of the right greater than left sacral ala and S2 segment. CT: Nondisplaced, vertical and transverse acute sacral insufficiency Fractures  -s/p perQ fixation on 5/20  --Management per ortho primary team: P   Complication of anesthesia    Versed - hard time working - "dinging for days" - Colonoscopy   Constipation 12/09/2012   Formatting of this note might be different from the original. IMPRESSION: increased constipation: trial of amitiza, to push fiber, can use mirlax prn.  eat 4 prunes a day. call prn. has had colonoscopy in past. is due next year for f/u   COPD (chronic obstructive pulmonary disease) (HCC) 02/10/2013   COPD (chronic obstructive pulmonary disease) with chronic bronchitis (HCC) 02/10/2013   Overnight pulse oximetry shows  desaturations greater than 5 minutes at a time less than 88%.  Order signed for nocturnal O2 at 2 L/m February 21, 2016  Overview:  Overnight pulse oximetry shows desaturations greater than 5 minutes at a time less than 88%.  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  therapy 12/07/2014   Diverticulosis of colon 10/12/2007   Qualifier: Diagnosis of  By: Celestia Colander CMA (AAMA), Christine Cozier     Essential tremor 05/22/2013   Gastroparesis    GERD (gastroesophageal reflux disease)    GERD with esophagitis 10/12/2007   Qualifier: Diagnosis of  By: Celestia Colander CMA (AAMA), Christine Cozier    Overview:  Overview:  Qualifier: Diagnosis of  By: Celestia Colander CMA Nonie Beady), Christine Cozier  Overview:  Overview:  Qualifier: Diagnosis of  By: Celestia Colander CMA Nonie Beady), Leisha    History of tobacco abuse 09/18/2013   Humerus distal fracture 01/24/2018   Hx of colonic polyps    Hypertension    Hypertension, benign 07/19/2007   Qualifier: Diagnosis of  By: Marilyn Shropshire    Overview:  Overview:  STORY: The goal for blood pressure is less than 140/90.  If you are checking your blood pressure at home, please record it and bring it to your next office visit. 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  I have reviewed the recent sleep study results with the patient.  We discussed how sleep apnea can  affect various health problems including risks for hypertension, cardiovascular disease, and diabetes.  We also discussed how sleep disruption can increase risks for accident, such as while driving.  Weight loss as a means of improving sl   OSA (obstructive sleep apnea) 09/18/2013   Osteoarthrosis 12/09/2012   Osteopenia of multiple sites 10/12/2007   Qualifier: Diagnosis of  By: Celestia Colander CMA (AAMA), Leisha     Osteoporosis    Palpitations 07/14/2018   Personal history of colonic polyps 10/07/2009   tubular adenoma   Tachycardia, paroxysmal (HCC) 07/04/2018   Urinary tract infection    Vitamin B12 deficiency    Vitamin D  deficiency 12/09/2012   Overview:  Overview:  IMPRESSION: Appropiate labs done today. We will send the results and adjust treatment as needed. Bone density discussed. There has  been an improvement in her bone density comparing the one in 2009 from the one 2011. From osteoporotic she went to osteopenic. She did take Actonel 3 years ago for several years and stopped it due to GI side effects. We will continue mantaining f    History of Present Illness  Virginia Burns is a 78 y.o. female with a PMH of ectopic atrial tachycardia, LBBB, HTN, COPD (on 2 L nasal cannula), GERD tobacco abuse, coronary calcifications who presents today for follow-up of recent pleural effusions and chest pain.  Ms. Chokshi was seen initially by Dr. Sandee Crook in 2020 for shortness of breath and tach cardia.  She underwent a CT of the chest that showed mild coronary calcium  in atherosclerosis.  She has severe COPD and wears 2 L nasal cannula.  She was seen to establish care with Dr. Rolm Clos on 07/07/2020.  She wore a 7-day Zio patch that showed brief episodes of SVT and atrial tachycardia.  She was started on Cardizem  240 mg daily was advised to continue 30 mg as needed for heart rate over 100.  She had an updated 2D echo completed 01/06/2022 showing stable EF of 60 to 65% with grade 1 DD and mild MVR with normal  pulmonary artery systolic pressure.  She was seen on 10/07/2022 after being admitted to the ER at [facility] for chest pain.  She underwent a nuclear stress test which showed possible small area of ischemia but overall was low risk.  She was noted to have slightly elevated BP at 146/74. Dr. Rolm Clos recommended a coronary CTA for further evaluation and to define coronary anatomy that showed calcium  score of 143 with moderate aortic atherosclerosis mixed plaque in the proximal and mid LAD with mild stenosis in the RCA.  She was started on Crestor  10 mg and advised to continue ASA 81 mg.  She was last seen by Idella Major on 07/26/2023 for follow-up visit.  During visit she reported doing well with occasional palpitations and improvement of symptoms with as needed Cardizem .  She denied any chest pain and blood pressure was stable and was encouraged to continue as needed medication.  She was seen in the ED at [facility] with complaint of chest pain that occurred 5 days after eating spicy beans.  She reported tightness in the bottom part of her chest and increased with burps but denied any radiation of discomfort.  She reports having to increase her nasal cannula to 3 L when walking around and noted her sats at 90%.  She went to urgent care and was advised to go to the ED for further evaluation.  EKG was completed showing LBBB with no significant change from prior study.  She had a CTA of the chest performed with no evidence of pulmonary embolism and mild coronary atherosclerosis and aortic atherosclerosis noted.  There was a trace pleural effusion noted on the left lung.  She had troponins completed and were negative and labs showed stable chronic sodium at 131.  She was treated with a GI cocktail with improvement to symptoms.  She was discharged in stable condition with strict ED precautions advised.  MS. Meaders was seen today for post ED follow-up with her son. Recently, she visited the ED with chest pain after  eating spicy beans, described as tightness in the upper chest, increasing with burps, without radiation. An EKG showed no significant changes, and a CT scan ruled out an embolism but revealed a trace pleural effusion in the left lung.  During today's visit she reports that she feels 'awful' with fluctuating good and bad days. Her heart rate was 129 upon waking, which calmed down after an additional dose of Cardizem . No return of chest pain, but she experiences occasional lightheadedness, possibly related to positional changes. No new or productive cough. She has a history of gastroparesis, which may contribute to her symptoms after eating spicy foods. She is not currently on Lasix but has been prescribed it in the past. Patient denies chest pain, palpitations, dyspnea, PND, orthopnea, nausea, vomiting, dizziness, syncope, edema, weight gain, or early satiety.  Discussed the use of AI scribe software for clinical note transcription with the patient, who gave verbal consent to proceed.  History of Present Illness   Review of Systems  Please see the history of present illness.    All other systems reviewed and are otherwise negative except as noted above.  Physical Exam     Wt Readings from Last 3 Encounters:  07/26/23 128 lb (58.1 kg)  10/07/22 137 lb 3.2 oz (62.2 kg)  06/26/22 145 lb 3.2 oz (65.9 kg)   ZO:XWRUE were no vitals filed for this visit.,There is no height or weight on file to calculate BMI. GEN: Well nourished, well developed in no acute distress Neck: No JVD; No carotid bruits Pulmonary: LLL with rhonchi and diminished breath sounds compared to right lower lobe Cardiovascular: Normal rate. Regular rhythm. Normal S1. Normal S2.   Murmurs: There is no murmur.  ABDOMEN: Soft, non-tender, non-distended EXTREMITIES:  No edema; No deformity   EKG/LABS/ Recent Cardiac Studies   ECG personally reviewed by me today -none completed today  Risk Assessment/Calculations:        Lab  Results  Component Value Date   WBC 7.9 05/06/2021   HGB 13.2 05/06/2021   HCT 40.4 05/06/2021   MCV 94.7 05/06/2021   PLT 410.0 (H) 05/06/2021   Lab Results  Component Value Date   CREATININE 0.47 05/06/2021   BUN 7 05/06/2021   NA 129 (L) 05/06/2021   K 4.5 05/06/2021   CL 91 (L) 05/06/2021   CO2 30 05/06/2021   No results found  for: "CHOL", "HDL", "LDLCALC", "LDLDIRECT", "TRIG", "CHOLHDL"  No results found for: "HGBA1C" Assessment & Plan   Assessment & Plan   1.  Chest pain: - Patient reports no recurrent chest pain since ED visit - Patient was advised to continue current GDMT with ASA 81 mg and Crestor  10 mg. -Strict ED precautions were discussed and encouraged if chest pain returns.  2.  Nonobstructive CAD:  Recent chest pain episode evaluated with negative troponin and normal EKG. - Continue Crestor  10 mg for cholesterol control. - Monitor for recurrence of chest pain.  3.  Essential hypertension: - Patient's blood pressure today was stable at 121/58 - Continue Cardizem  360 mg daily  4.  COPD: -Severe COPD, Stage 4, with increased oxygen  requirement. Possible exacerbation contributing to pleural effusion. - Order chest x-ray and echocardiogram to evaluate pleural effusion, fluid status, and heart function. -Will add as needed Lasix 20 mg based on results of chest x-ray to be taking sparingly with potassium 10 mEq. -Patient advised not to start medication until chest x-ray results are available. - Adjust oxygen  therapy based on oxygen  saturation.  5.  Ectopic atrial tachycardia -Intermittent tachycardia with heart rate reaching 129 bpm. Symptoms improved with additional Cardizem . Possible relation to fluid status or medication effects. - Continue Cardizem  regimen and monitor heart rate. - Evaluate fluid status with chest x-ray and echocardiogram to assess contribution to tachycardia.  Disposition: Follow-up with Oneil Bigness, MD or APP in 6 months     Signed, Francene Ing, Retha Cast, NP 09/30/2023, 6:09 PM [facility]

## 2023-10-01 ENCOUNTER — Ambulatory Visit: Attending: Nurse Practitioner | Admitting: Nurse Practitioner

## 2023-10-01 ENCOUNTER — Encounter: Payer: Self-pay | Admitting: Nurse Practitioner

## 2023-10-01 ENCOUNTER — Ambulatory Visit: Payer: Self-pay | Admitting: Nurse Practitioner

## 2023-10-01 ENCOUNTER — Ambulatory Visit (HOSPITAL_COMMUNITY)
Admission: RE | Admit: 2023-10-01 | Discharge: 2023-10-01 | Disposition: A | Discharge status: Home / Self Care | Source: Ambulatory Visit | Attending: Nurse Practitioner | Admitting: Nurse Practitioner

## 2023-10-01 VITALS — BP 121/58 | HR 77 | Ht 65.5 in | Wt 127.4 lb

## 2023-10-01 DIAGNOSIS — R072 Precordial pain: Secondary | ICD-10-CM | POA: Diagnosis present

## 2023-10-01 DIAGNOSIS — J449 Chronic obstructive pulmonary disease, unspecified: Secondary | ICD-10-CM

## 2023-10-01 DIAGNOSIS — I4719 Other supraventricular tachycardia: Secondary | ICD-10-CM

## 2023-10-01 DIAGNOSIS — I10 Essential (primary) hypertension: Secondary | ICD-10-CM

## 2023-10-01 DIAGNOSIS — I2510 Atherosclerotic heart disease of native coronary artery without angina pectoris: Secondary | ICD-10-CM | POA: Diagnosis present

## 2023-10-01 MED ORDER — POTASSIUM CHLORIDE ER 10 MEQ PO TBCR: 10.0000 meq | EXTENDED_RELEASE_TABLET | Freq: Every day | ORAL | 3 refills | Status: DC

## 2023-10-01 MED ORDER — FUROSEMIDE 20 MG PO TABS: 20.0000 mg | ORAL_TABLET | Freq: Every day | ORAL | 0 refills | Status: DC | PRN

## 2023-10-01 NOTE — Patient Instructions (Signed)
 Medication Instructions:  START Lasix 20mg  take once a day as needed START Potassium take once a day as needed only when taking Lasix  *If you need a refill on your cardiac medications before your next appointment, please call your pharmacy*  Lab Work: None ordered If you have labs (blood work) drawn today and your tests are completely normal, you will receive your results only by: MyChart Message (if you have MyChart) OR A paper copy in the mail If you have any lab test that is abnormal or we need to change your treatment, we will call you to review the results.  Testing/Procedures: A chest x-ray takes a picture of the organs and structures inside the chest, including the heart, lungs, and blood vessels. This test can show several things, including, whether the heart is enlarges; whether fluid is building up in the lungs; and whether pacemaker / defibrillator leads are still in place.  Your physician has requested that you have an echocardiogram. Echocardiography is a painless test that uses sound waves to create images of your heart. It provides your doctor with information about the size and shape of your heart and how well your heart's chambers and valves are working. This procedure takes approximately one hour. There are no restrictions for this procedure. Please do NOT wear cologne, perfume, aftershave, or lotions (deodorant is allowed). Please arrive 15 minutes prior to your appointment time.  Please note: We ask at that you not bring children with you during ultrasound (echo/ vascular) testing. Due to room size and safety concerns, children are not allowed in the ultrasound rooms during exams. Our front office staff cannot provide observation of children in our lobby area while testing is being conducted. An adult accompanying a patient to their appointment will only be allowed in the ultrasound room at the discretion of the ultrasound technician under special circumstances. We  apologize for any inconvenience.   Follow-Up: At [facility], you and your health needs are our priority.  As part of our continuing mission to provide you with exceptional heart care, our providers are all part of one team.  This team includes your primary Cardiologist (physician) and Advanced Practice Providers or APPs (Physician Assistants and Nurse Practitioners) who all work together to provide you with the care you need, when you need it.  Your next appointment:   6 month(s)  Provider:   Oneil Bigness, MD    We recommend signing up for the patient portal called "MyChart".  Sign up information is provided on this After Visit Summary.  MyChart is used to connect with patients for Virtual Visits (Telemedicine).  Patients are able to view lab/test results, encounter notes, upcoming appointments, etc.  Non-urgent messages can be sent to your provider as well.   To learn more about what you can do with MyChart, go to [email or web address].   Other Instructions

## 2023-10-04 NOTE — Telephone Encounter (Signed)
**Note De-identified  [place] Obfuscation** Please advise 

## 2023-10-15 ENCOUNTER — Ambulatory Visit: Admitting: General Practice

## 2023-10-16 ENCOUNTER — Encounter: Payer: Self-pay | Admitting: Cardiovascular Disease

## 2023-11-01 ENCOUNTER — Ambulatory Visit (HOSPITAL_COMMUNITY)
Admission: RE | Admit: 2023-11-01 | Discharge: 2023-11-01 | Disposition: A | Discharge status: Home / Self Care | Source: Ambulatory Visit | Attending: Cardiology | Admitting: Cardiology

## 2023-11-01 DIAGNOSIS — I080 Rheumatic disorders of both mitral and aortic valves: Secondary | ICD-10-CM

## 2023-11-01 DIAGNOSIS — I2510 Atherosclerotic heart disease of native coronary artery without angina pectoris: Secondary | ICD-10-CM

## 2023-11-01 DIAGNOSIS — J449 Chronic obstructive pulmonary disease, unspecified: Secondary | ICD-10-CM | POA: Diagnosis not present

## 2023-11-01 DIAGNOSIS — I447 Left bundle-branch block, unspecified: Secondary | ICD-10-CM

## 2023-11-01 DIAGNOSIS — I4719 Other supraventricular tachycardia: Secondary | ICD-10-CM

## 2023-11-01 DIAGNOSIS — R072 Precordial pain: Secondary | ICD-10-CM | POA: Diagnosis present

## 2023-11-01 DIAGNOSIS — I10 Essential (primary) hypertension: Secondary | ICD-10-CM

## 2023-11-01 LAB — ECHOCARDIOGRAM COMPLETE
Area-P 1/2: 3.43 cm2
S' Lateral: 2.62 cm

## 2023-11-10 ENCOUNTER — Other Ambulatory Visit (HOSPITAL_COMMUNITY)

## 2023-12-02 ENCOUNTER — Telehealth: Payer: Self-pay | Admitting: Nurse Practitioner

## 2023-12-02 NOTE — Telephone Encounter (Signed)
 Pt states she has been taking her protonix  and domperidone but is having issues with her gastroparesis. Suggested she do clear liquids and she states she was but had to have something. States she had yogurt this am and meatloaf and mac and chz this afternoon. Discussed she may want to do yogurt and jello, perhaps some soup if she needs more than clears for a few days and see how she does. Pt verbalized understanding.

## 2023-12-02 NOTE — Telephone Encounter (Signed)
 Inbound call from patient. She is having a gastroparesis  attack and would like to know what she should do. Please advise.

## 2023-12-08 ENCOUNTER — Encounter: Payer: Self-pay | Admitting: Cardiovascular Disease

## 2023-12-08 NOTE — Telephone Encounter (Addendum)
 Copied from CRM #[id number]. Topic: Clinical Concerns - Emergent Call >> Dec 08, 2023 12:48 PM Virginia Burns wrote: Virginia Burns is calling other request    Include all details related to the request(s) below:  Patient is calling to report blood pressure concerns while she has been sick from gastroparesis condition.   Symptoms of vomiting and diarrhea for about a week and has been recently been introducing solid foods again.   Blood pressure has been as low as 90/55 and when measured over the phone 109/61 pulse of 56. Some dizziness, nausea, felt shakiness when moving throughout the home yesterday.  Triaged due to safety protocols.    Confirm and type the Best Contact Number below:  Patient/caller contact number:   [phone]          [] Home  [x] Mobile  [] Work  []  Other   [x]  Okay to leave a voicemail   Medication List:  Current Outpatient Medications:  .  ascorbic acid  (Vitamin C ) 100 mg tablet, Take 100 mg by mouth Once Daily., Disp: , Rfl:  .  aspirin  81 mg EC tablet, Take 81 mg by mouth Once Daily., Disp: , Rfl:  .  calcium  carbonate-vitamin D3 500 mg-3.125 mcg (125 unit) tab, Take 1 tablet by mouth Once Daily., Disp: , Rfl:  .  cranberry extract 500 mg cap, Take 1,000 mg by mouth Once Daily., Disp: , Rfl:  .  cyanocobalamin (VITAMIN B12) 1,000 mcg tablet, Take 2,000 mcg by mouth., Disp: , Rfl:  .  dilTIAZem  (CARDIZEM ) 30 mg immediate release tablet, Take 30 mg by mouth as needed. Daily as needed, Disp: , Rfl:  .  dilTIAZem  (TIAZAC ) 360 mg Cs24, Take 1 capsule by mouth daily., Disp: , Rfl:  .  estradioL (ESTRACE) 0.01 % (0.1 mg/gram) vaginal cream, Insert 1 gram vaginally twice weekly as needed. Please give extra applicators., Disp: 42.5 g, Rfl: 1 .  flaxseed oiL 1,000 mg capsule, Take 2,000 mg by mouth., Disp: , Rfl:  .  gabapentin (NEURONTIN) 600 mg tablet, TAKE 1/2 (ONE-HALF) TABLET BY MOUTH THREE TIMES DAILY, Disp: 135 tablet, Rfl: 0 .  hydrocortisone 2.5 % ointment,  Apply topically 2 (two) times a day. To red area on upper lip until it improves, Disp: 28.35 g, Rfl: 5 .  L. acidophilus/Bifid. animalis (Promella) 32 billion cell cap, Take 1 capsule by mouth., Disp: , Rfl:  .  LORazepam  (ATIVAN ) 0.5 mg tablet, TAKE 1 TABLET BY MOUTH NIGHTLY AS NEEDED FOR ANXIETY (Patient not taking: Reported on 10/04/2023), Disp: 20 tablet, Rfl: 0 .  pantoprazole  (PROTONIX ) 40 mg EC tablet, Take 40 mg by mouth every morning before breakfast., Disp: , Rfl:  .  Procto-Med HC 2.5 % rectal cream, APPLY  CREAM RECTALLY TWICE DAILY, Disp: 2 g, Rfl: 0 .  Prolia  60 mg/mL syrg syringe, Inject 60 mg under the skin every 6 (six) months., Disp: , Rfl:  .  pumpkin seed extract-soy germ (Azo Bladder ControL) 300 mg cap, Take 1 capsule by mouth 2 (two) times a day., Disp: , Rfl:  .  Symbicort  160-4.5 mcg/actuation inhaler, INHALE 2 PUFFS BY MOUTH IN THE MORNING AND 2 PUFFS BEFORE BEDTIME, Disp: 10.2 g, Rfl: 3 .  tiotropium (Spiriva  with HandiHaler) 18 mcg per inhalation, Inhale 1 puff by mouth once daily, Disp: 90 capsule, Rfl: 0 .  tiotropium bromide  (Spiriva  Respimat) 2.5 mcg/actuation mist inhaler, INHALE 2 SPRAY(S) BY MOUTH ONCE DAILY, Disp: 4 g, Rfl: 2     Medication Request/Refills: Pharmacy Information (  if applicable)   [x]  Not Applicable       []  Pharmacy listed  Send Medication Request to:                                                 []  Pharmacy not listed (added to pharmacy list in Epic) Send Medication Request to:      Listed Pharmacies: [id number] [address] [place], [place] - [address] - PHONE: [phone] - FAX: [phone]

## 2023-12-14 ENCOUNTER — Encounter: Payer: Self-pay | Admitting: Neurology

## 2023-12-14 ENCOUNTER — Ambulatory Visit: Admitting: Neurology

## 2023-12-14 VITALS — BP 153/67 | HR 98 | Ht 65.5 in | Wt 125.0 lb

## 2023-12-14 DIAGNOSIS — G609 Hereditary and idiopathic neuropathy, unspecified: Secondary | ICD-10-CM | POA: Diagnosis not present

## 2023-12-14 DIAGNOSIS — G250 Essential tremor: Secondary | ICD-10-CM | POA: Diagnosis not present

## 2023-12-14 NOTE — Progress Notes (Signed)
 [facility]    Provider:  Dr Ines Requesting Provider: Fulton Rain, MD Primary Care Provider:  [name] CROME, NP  CC:  peripheral neuropathy  HPI:  Virginia Burns is a 78 y.o. female here as requested by Fulton Rain, MD for peripheral neuropathy. has Hypertension, benign; GERD with esophagitis; Gastroparesis; Diverticulosis of colon; Osteopenia of multiple sites; COPD (chronic obstructive pulmonary disease) (HCC); Essential tremor; OSA (obstructive sleep apnea); History of tobacco abuse; Hx of colonic polyps; Benign neoplasm of ascending colon; Benign neoplasm of descending colon; Chronic respiratory failure (HCC); Humerus distal fracture; Neuropathy of left radial nerve; Tachycardia, paroxysmal (HCC); Adjustment disorder with anxiety; Anxiety state; Centrilobular emphysema (HCC); COPD (chronic obstructive pulmonary disease) with chronic bronchitis (HCC); 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  therapy; Insomnia; Multinodular goiter; Osteoarthrosis; Vitamin D  deficiency; Obstructive sleep apnea (adult) (pediatric); Palpitations; Aortic ejection murmur; Vitamin B12 deficiency; Urinary tract infection; History of colonic polyps; Osteoporosis; Hypertension; GERD (gastroesophageal reflux disease); COPD (chronic obstructive pulmonary disease) with emphysema (HCC); Complication of anesthesia; Allergic rhinitis; Chronic hyponatremia; Closed fracture of sacrum and coccyx (HCC); Constipation; Decreased mobility; Impaired mobility and ADLs; Intractable low back pain; and Inability to walk on their problem list.  Her feet ache and burn all the time right under the toes. Started years ago maybe even 5 years ago, she has been going to Dr. Fulton for a long time. She takes gabapentin for the feet. She is using 4 creams otherwise she hobbles. Started in the heels years ago and now in the balls of the feet mostly. Burning, tingling, aching. All day long continuous. Mornings are worse when she takes her  first steps but now the real pain is in the balls of the feet. No weakness. No rashes no swelling. She takes B12 supplementation doesn;t know how long she was B12 deficient. No low back pain or radiculopathy. Some days are worse than others. Uses icy hot, biofreeze, and mamma bear oasis and eucalyptus, she states is very painful, cream helps. She doesn;t want to be stuck today.    Reviewed notes, labs and imaging from outside physicians, which showed:  Reviewed referral from podiatry [facility] [place]: Patient complained of burning and tingling in the feet, difficulty walking, pain is throbbing burning in nature, has tried changes in shoes with no relief, no history of trauma, also complaining of thickened and elongated toenails causing pain.  Examination shows light touch sensation to diminished, vibratory sensation diminished at the first metatarsal head bilaterally, muscle strength 4 out of 5 in all 4 quadrants bilateral, idiopathic neuropathy, appointment was July 26, 2023.  Labs Sep 16, 2023 showed: CMP low sodium 131, low chloride 94, creatinine 0.45 which is low but EGFR greater than 90, BUN to creatinine ratio elevated 40, otherwise unremarkable.  CBC showed elevated platelet count 486, otherwise unremarkable.  July 20, 2023 vitamin D  34.6 within normal limits.  April 22, 2023 thyroid  normal 1.073, B12 normal 546, patient has a history of B12 deficiency and in November 2021 her B12 was extremely low at 134.  08/23/2021: CLINICAL DATA:  Multiple falls.  Reviewed images and agree with the following.   EXAM: CT HEAD WITHOUT CONTRAST   TECHNIQUE: Contiguous axial images were obtained from the base of the skull through the vertex without intravenous contrast.   RADIATION DOSE REDUCTION: This exam was performed according to the departmental dose-optimization program which includes automated exposure control, adjustment of the mA and/or kV according to patient size and/or use of iterative  reconstruction technique.  COMPARISON:  No comparison studies available.   FINDINGS: Brain: There is no evidence for acute hemorrhage, hydrocephalus, mass lesion, or abnormal extra-axial fluid collection. No definite CT evidence for acute infarction. Diffuse loss of parenchymal volume is consistent with atrophy. Patchy low attenuation in the deep hemispheric and periventricular white matter is nonspecific, but likely reflects chronic microvascular ischemic demyelination.   Vascular: No hyperdense vessel or unexpected calcification.   Skull: No evidence for fracture. No worrisome lytic or sclerotic lesion.   Sinuses/Orbits: The visualized paranasal sinuses and mastoid air cells are clear. Visualized portions of the globes and intraorbital fat are unremarkable.   Other: None.   IMPRESSION: 1. No acute intracranial abnormality. 2. Atrophy with chronic small vessel ischemic disease. Review of Systems: Patient complains of symptoms per HPI as well as the following symptoms per hpi. Pertinent negatives and positives per HPI. All others negative.   Social History   Socioeconomic History   Marital status: Single    Spouse name: Not on file   Number of children: 1   Years of education: Not on file   Highest education level: Not on file  Occupational History   Occupation: Realtor    Employer: [employer]  Tobacco Use   Smoking status: Former    Current packs/day: 0.00    Average packs/day: 1 pack/day for 25.0 years (25.0 ttl pk-yrs)    Types: Cigarettes    Start date: 05/18/1984    Quit date: 05/18/2009    Years since quitting: 14.5   Smokeless tobacco: Never   Tobacco comments:    Nicotine  lozenges  Vaping Use   Vaping status: Never Used  Substance and Sexual Activity   Alcohol use: Not Currently    Alcohol/week: 2.0 standard drinks of alcohol    Types: 2 Glasses of wine per week    Comment: 2 glasses of wine each night   Drug use: No   Sexual activity: Not on  file  Other Topics Concern   Not on file  Social History Narrative   She currently works as a Veterinary surgeon. She lives alone.  She is divorced.      Social Drivers of Health   Financial Resource Strain: Low Risk  (03/27/2022)   Received from [facility] visits prior to 07/18/2022., [facility]   Overall Financial Resource Strain (CARDIA)    Difficulty of Paying Living Expenses: Not very hard  Food Insecurity: Low Risk  (02/19/2023)   Received from [facility]   Hunger Vital Sign    Within the past 12 months, you worried that your food would run out before you got money to buy more: Never true    Within the past 12 months, the food you bought just didn't last and you didn't have money to get more. : Never true  Transportation Needs: No Transportation Needs (02/19/2023)   Received from [facility]    In the past 12 months, has lack of reliable transportation kept you from medical appointments, meetings, work or from getting things needed for daily living? : No  Recent Concern: Transportation Needs - Unmet Transportation Needs (12/30/2022)   Received from [facility]    In the past 12 months, has lack of reliable transportation kept you from medical appointments, meetings, work or from getting things needed for daily living? : Yes  Physical Activity: Unknown (03/27/2022)   Received from [facility] visits prior to 07/18/2022., [facility]  Exercise Vital Sign    On average, how many days per week do you engage in moderate to strenuous exercise (like a brisk walk)?: Patient declined    On average, how many minutes do you engage in exercise at this level?: 30 min  Stress: No Stress Concern Present (03/27/2022)   Received from [facility] visits prior to 07/18/2022., [facility]   [employer] of Occupational Health - Occupational Stress Questionnaire    Feeling of Stress : Not at all   Social Connections: Unknown (03/27/2022)   Received from [facility] visits prior to 07/18/2022., [facility]   Social Connection and Isolation Panel    In a typical week, how many times do you talk on the phone with family, friends, or neighbors?: More than three times a week    How often do you get together with friends or relatives?: Once a week    How often do you attend church or religious services?: Patient declined    Do you belong to any clubs or organizations such as church groups, unions, fraternal or athletic groups, or school groups?: No    How often do you attend meetings of the clubs or organizations you belong to?: Never    Are you married, widowed, divorced, separated, never married, or living with a partner?: Divorced  Intimate Partner Violence: Not At Risk (03/27/2022)   Received from [facility] visits prior to 07/18/2022.   Humiliation, Afraid, Rape, and Kick questionnaire    Within the last year, have you been afraid of your partner or ex-partner?: No    Within the last year, have you been humiliated or emotionally abused in other ways by your partner or ex-partner?: No    Within the last year, have you been kicked, hit, slapped, or otherwise physically hurt by your partner or ex-partner?: No    Within the last year, have you been raped or forced to have any kind of sexual activity by your partner or ex-partner?: No    Family History  Problem Relation Age of Onset   COPD Mother        Deceased, 59   Tremor Mother    Emphysema Mother    Other Father        Deceased, 89   Diabetes Brother    Colitis Son    Diabetes type II Son    Tremor Maternal Aunt    Colon cancer Cousin    Esophageal cancer Neg Hx    Liver cancer Neg Hx    Pancreatic cancer Neg Hx    Neuropathy Neg Hx     Past Medical History:  Diagnosis Date   Adjustment disorder with anxiety 08/17/2013   Allergic rhinitis    Anxiety state 08/17/2013   Overview:   IMPRESSION: hx of anxiety, lost a friend and has friend in ICU, using the ativan  prn. had refilled 07/27/13   Aortic ejection murmur 07/14/2018   Benign neoplasm of ascending colon 12/07/2014   Benign neoplasm of descending colon 12/07/2014   Centrilobular emphysema (HCC) 12/07/2014   Chronic hyponatremia 10/05/2018   Last Assessment & Plan:  Formatting of this note is different from the original. Recent Labs    10/05/18 0007 10/06/18 0504 10/07/18 0733  NA 132* 131* 135   Baseline ~ 132   -stable   Chronic respiratory failure (HCC) 03/04/2015   Closed fracture of sacrum and coccyx (HCC) 10/03/2018   Last Assessment & Plan:  Formatting of this note might be different from the original. Patient presented with progressively worsening lower back pain and inability to ambulate  MR: Acute fracture of the right greater than left sacral ala and S2 segment. CT: Nondisplaced, vertical and transverse acute sacral insufficiency Fractures  -s/p perQ fixation on 5/20  --Management per ortho primary team: P   Complication of anesthesia    Versed - hard time working - dinging for days - Colonoscopy   Constipation 12/09/2012   Formatting of this note might be different from the original. IMPRESSION: increased constipation: trial of amitiza, to push fiber, can use mirlax prn.  eat 4 prunes a day. call prn. has had colonoscopy in past. is due next year for f/u   COPD (chronic obstructive pulmonary disease) (HCC) 02/10/2013   COPD (chronic obstructive pulmonary disease) with chronic bronchitis (HCC) 02/10/2013   Overnight pulse oximetry shows desaturations greater than 5 minutes at a time less than 88%.  Order signed for nocturnal O2 at 2 L/m February 21, 2016  Overview:  Overnight pulse oximetry shows desaturations greater than 5 minutes at a time less than 88%.  Order signed for nocturnal O2 at 2 L/m February 21, 2016 Overview:  Overnight pulse oximetry shows desaturations greater than 5 minutes at a time l   COPD (chronic  obstructive pulmonary disease) with emphysema (HCC)    Decreased mobility 01/30/2020   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  therapy 12/07/2014   Diverticulosis of colon 10/12/2007   Qualifier: Diagnosis of  By: Genie CMA (AAMA), Chick     Essential tremor 05/22/2013   Gastroparesis    GERD (gastroesophageal reflux disease)    GERD with esophagitis 10/12/2007   Qualifier: Diagnosis of  By: Kowalk CMA (AAMA), Chick    Overview:  Overview:  Qualifier: Diagnosis of  By: Kowalk CMA LEODIS), Chick  Overview:  Overview:  Qualifier: Diagnosis of  By: Genie CMA LEODIS), Leisha    History of tobacco abuse 09/18/2013   Humerus distal fracture 01/24/2018   Hx of colonic polyps    Hypertension    Hypertension, benign 07/19/2007   Qualifier: Diagnosis of  By: Jacqueline Riggs    Overview:  Overview:  STORY: The goal for blood pressure is less than 140/90.  If you are checking your blood pressure at home, please record it and bring it to your next office visit. 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  I have reviewed the recent sleep study results with the patient.  We discussed how sleep apnea can affect various health problems including risks for hypertension, cardiovascular disease, and diabetes.  We also discussed how sleep disruption can increase risks for accident, such as while driving.  Weight loss as a means of improving sl   OSA (obstructive sleep apnea) 09/18/2013   Osteoarthrosis 12/09/2012   Osteopenia of multiple sites 10/12/2007   Qualifier: Diagnosis of  By: Kowalk  CMA (AAMA), Leisha     Osteoporosis    Palpitations 07/14/2018   Personal history of colonic polyps 10/07/2009   tubular adenoma   Tachycardia, paroxysmal (HCC)  07/04/2018   Urinary tract infection    Vitamin B12 deficiency    Vitamin D  deficiency 12/09/2012   Overview:  Overview:  IMPRESSION: Appropiate labs done today. We will send the results and adjust treatment as needed. Bone density discussed. There has  been an improvement in her bone density comparing the one in 2009 from the one 2011. From osteoporotic she went to osteopenic. She did take Actonel 3 years ago for several years and stopped it due to GI side effects. We will continue mantaining f    Patient Active Problem List   Diagnosis Date Noted   Idiopathic polyneuropathy 12/14/2023   Decreased mobility 01/30/2020   Impaired mobility and ADLs 01/30/2020   Vitamin A87 deficiency    Urinary tract infection    Osteoporosis    Hypertension    GERD (gastroesophageal reflux disease)    COPD (chronic obstructive pulmonary disease) with emphysema (HCC)    Complication of anesthesia    Allergic rhinitis    Chronic hyponatremia 10/05/2018   Closed fracture of sacrum and coccyx (HCC) 10/03/2018   Intractable low back pain 10/03/2018   Inability to walk 10/03/2018   Palpitations 07/14/2018   Aortic ejection murmur 07/14/2018   Tachycardia, paroxysmal (HCC) 07/04/2018   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   Humerus distal fracture 01/24/2018   Multinodular goiter 06/01/2017   Chronic respiratory failure (HCC) 03/04/2015   Benign neoplasm of ascending colon 12/07/2014   Benign neoplasm of descending colon 12/07/2014   Centrilobular emphysema (HCC) 12/07/2014   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  therapy 12/07/2014   Hx of colonic polyps    OSA (obstructive sleep apnea) 09/18/2013   History of tobacco abuse 09/18/2013   Obstructive sleep apnea (adult) (pediatric) 09/18/2013   Adjustment disorder with anxiety 08/17/2013    Anxiety state 08/17/2013   Essential tremor 05/22/2013   COPD (chronic obstructive pulmonary disease) (HCC) 02/10/2013   COPD (chronic obstructive pulmonary disease) with chronic bronchitis (HCC) 02/10/2013   Osteoarthrosis 12/09/2012   Vitamin D  deficiency 12/09/2012   Constipation 12/09/2012   Insomnia 10/22/2012   History of colonic polyps 10/07/2009   GERD with esophagitis 10/12/2007   Diverticulosis of colon 10/12/2007   Osteopenia of multiple sites 10/12/2007   Hypertension, benign 07/19/2007   Gastroparesis 07/19/2007    Past Surgical History:  Procedure Laterality Date   ABDOMINAL HYSTERECTOMY     ABDOMINAL HYSTERECTOMY     CATARACT EXTRACTION Right    CHOLECYSTECTOMY     COLONOSCOPY WITH PROPOFOL  N/A 11/21/2014   Procedure: COLONOSCOPY WITH PROPOFOL ;  Surgeon: Norleen LOISE Kiang, MD;  Location: [facility] ENDOSCOPY;  Service: Endoscopy;  Laterality: N/A;   FRACTURE SURGERY     GALLBLADDER SURGERY  1986   LIGAMENT REPAIR Left 04/18/2013   Procedure: Triangular Fibrocartilage complex open repair;  Surgeon: Alm DELENA Hummer, MD;  Location: [facility];  Service: Orthopedics;  Laterality: Left;   OOPHORECTOMY     OPEN REDUCTION INTERNAL FIXATION (ORIF) DISTAL RADIAL FRACTURE Left 04/18/2013   Procedure: LEFT OPEN REDUCTION INTERNAL FIXATION (ORIF) DISTAL RADIAL FRACTURE;  Surgeon: Alm DELENA Hummer, MD;  Location: [facility];  Service: Orthopedics;  Laterality: Left;   ORIF ELBOW FRACTURE Left 01/24/2018   Procedure: OPEN REDUCTION INTERNAL FIXATION (ORIF) DISTAL HUMERUS  ELBOW/OLECRANON OSTEOTOMY FRACTURE;  Surgeon: Barbarann Oneil BROCKS, MD;  Location: [facility] OR;  Service: Orthopedics;  Laterality: Left;   PELVIC FRACTURE SURGERY  2020   at [facility] in [place]    Current Outpatient Medications  Medication Sig Dispense Refill   AMBULATORY NON FORMULARY MEDICATION Take 10 mg by mouth 3 (three) times daily before meals. Medication Name: Domperidone 270 tablet 3    Ascorbic Acid  (VITAMIN C ) 1000 MG tablet Take 1,000 mg by mouth daily.     aspirin  EC 81 MG tablet Take 81 mg by mouth daily.     budesonide -formoterol  (SYMBICORT ) 160-4.5 MCG/ACT inhaler Inhale 2 puffs into the lungs 2 (two) times daily. 1 Inhaler 5   CALCIUM  PO Take 600 mg by mouth daily.     Cholecalciferol  25 MCG (1000 UT) tablet Take 2,000 Units by mouth daily.     Cranberry 1000 MG CAPS Take 1,000 mg by mouth daily. (Patient taking differently: Take 2,000 mg by mouth daily.)     Cranberry-Vitamin C -Probiotic (AZO CRANBERRY PO) Take by mouth 2 (two) times daily.     denosumab  (PROLIA ) 60 MG/ML SOSY injection Inject 60 mg into the skin every 6 (six) months.     diltiazem  (CARDIZEM ) 30 MG tablet Take 1 tablet (30 mg total) by mouth as needed (for HR above 100). 90 tablet 1   diltiazem  (TIAZAC ) 360 MG 24 hr capsule Take 360 mg by mouth daily.     Flaxseed, Linseed, (FLAX SEED OIL) 1000 MG CAPS Take 2,000 mg by mouth daily.     gabapentin (NEURONTIN) 600 MG tablet Take 0.5 tablets by mouth 3 (three) times daily.     pantoprazole  (PROTONIX ) 40 MG tablet Take 1 tablet by mouth 2 (two) times daily. Gastroparesis     Tiotropium Bromide  Monohydrate (SPIRIVA  RESPIMAT) 2.5 MCG/ACT AERS INHALE 2 SPRAY(S) BY MOUTH ONCE DAILY 4 g 6   vitamin B-12 (CYANOCOBALAMIN) 1000 MCG tablet Take 2,000 mcg by mouth daily.     No current facility-administered medications for this visit.    Allergies as of 12/14/2023 - Review Complete 12/14/2023  Allergen Reaction Noted   Clindamycin/lincomycin Hives 09/23/2011   Bactrim [sulfamethoxazole-trimethoprim] Swelling 12/01/2019   Doxycycline Other (See Comments) 03/17/2021   Elemental sulfur Rash 03/10/2016    Vitals: BP (!) 153/67 (BP Location: Right Arm, Patient Position: Sitting, Cuff Size: Normal)   Pulse 98   Ht 5' 5.5 (1.664 m)   Wt 125 lb (56.7 kg)   BMI 20.48 kg/m  Last Weight:  Wt Readings from Last 1 Encounters:  12/14/23 125 lb (56.7 kg)   Last  Height:   Ht Readings from Last 1 Encounters:  12/14/23 5' 5.5 (1.664 m)     Physical exam: Exam: Gen: NAD, conversant, well nourised, thin, well groomed                     CV: breathing normal will 02. No peripheral edema, warm, nontender Eyes: Conjunctivae clear without exudates or hemorrhage  Neuro: Detailed Neurologic Exam  Speech:    Speech is normal; fluent and spontaneous with normal comprehension.  Cognition:    The patient is oriented to person, place, and time;     recent and remote memory intact;     language fluent;     normal attention, concentration,     fund of knowledge Cranial Nerves:    The pupils are equal, round, and reactive to light. Pupils too small to visualize fundi. Visual fields are full to finger confrontation. Extraocular movements are intact. Trigeminal sensation is intact and the muscles of mastication are normal. The face is symmetric. The palate elevates in the midline. Hearing intact. Voice is normal. Shoulder shrug is normal. The tongue has  normal motion without fasciculations.   Coordination: No dysmeria or ataxia noted  Gait: Uses a walker, good balance, not ataxic, not shuffling  Motor Observation:    No asymmetry, no atrophy, limb and head/voice tremor(has been diagnosed with essential tremor) Tone:    Normal muscle tone.    Posture: Appears nml in chair, slightly stooped with walker      Strength:    Strength is symmetrical in the upper and lower limbs without focal weakness     Sensation: decreased to pin prick, temp in a gradient fashion, a few second vibration at the great toes (normal for age) and intact proprioception     Reflex Exam:  DTR's:    Absent AJs, 1+ patellars and biceps.    Toes:    The toes are downgoing bilaterally.   Clonus:    Clonus is absent.    Assessment/Plan:  Virginia Burns is a 78 y.o. female here as requested by Fulton Rain, MD for peripheral neuropathy. has Hypertension, benign; GERD with  esophagitis; Gastroparesis; Diverticulosis of colon; Osteopenia of multiple sites; COPD (chronic obstructive pulmonary disease) (HCC); Essential tremor; OSA (obstructive sleep apnea); History of tobacco abuse; Hx of colonic polyps; Benign neoplasm of ascending colon; Benign neoplasm of descending colon; Chronic respiratory failure (HCC); Humerus distal fracture; Neuropathy of left radial nerve; Tachycardia, paroxysmal (HCC); Adjustment disorder with anxiety; Anxiety state; Centrilobular emphysema (HCC); COPD (chronic obstructive pulmonary disease) with chronic bronchitis (HCC); 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  therapy; Insomnia; Multinodular goiter; Osteoarthrosis; Vitamin D  deficiency; Obstructive sleep apnea (adult) (pediatric); Palpitations; Aortic ejection murmur; Vitamin B12 deficiency; Urinary tract infection; History of colonic polyps; Osteoporosis; Hypertension; GERD (gastroesophageal reflux disease); COPD (chronic obstructive pulmonary disease) with emphysema (HCC); Complication of anesthesia; Allergic rhinitis; Chronic hyponatremia; Closed fracture of sacrum and coccyx (HCC); Constipation; Decreased mobility; Impaired mobility and ADLs; Intractable low back pain; and Inability to walk on their problem list.  Peripheral neuropathy:   Discussed the causes of peripheral neuropathy, the most common being diabetes which patient does not report having. About 20 million people in the [place]  have some form of peripheral neuropathy. This is a condition that develops as a result of damage to the peripheral nervous system. Given symptoms which are distal predominant in the balls of the feet, symmetrical, and an ascending pattern with decreased sensation in small and large fibers in a gradient fashion, suspect a symmetric length dependent neuropathy probably axonal.. There are multiple causes eluding metabolic, toxic, infectious and endocrine disorders, small vessel disease, autoimmune diseases, and others.  She has been B12 deficient in the past. Tried multiple topicals including capsaicin, lidocaine , compounded creams, gabapentin. Discussed other options including  Increasing gabapentin or changing to Lyrica. Also other medications such as Cymbalta. Qutenza which a patch procedure in the office. Topical cremes, capsaicin, compounded creams Bloodwork: Would like to have Santana Molt review and complete as clinically warranted, could consider Check B6, B1, IFE/SPEP, hgba1c for other causes of neuropathy  Essential tremor:  Discussed medications have significant side effects, recommend weighted silverware or on the wrists.  Discussed MRI guided ultrasound   Cc: Fulton Rain, MD,  Molt Santana CROME, NP  Onetha Epp, MD  [facility] [address] [place] [place], [place] [address]  Phone [phone] Fax [phone]  I spent 65 minutes of face-to-face and non-face-to-face time with patient on the  1. Idiopathic polyneuropathy   2. Essential tremor    diagnosis.  This included previsit chart review, lab review, study review, order entry,  electronic health record documentation, patient education on the different diagnostic and therapeutic options, counseling and coordination of care, risks and benefits of management, compliance, or risk factor reduction

## 2023-12-14 NOTE — Patient Instructions (Addendum)
 Options include:  Increasing gabapentin or changing to Lyrica. Also other medications such as Cymbalta Qutenza which a patch procedure in the office Topical cremes, capsaicin, compounded creams Bloodwork: Check B6, B1, IFE/SPEP, hgba1c for other causes of neuropathy  Peripheral Neuropathy Peripheral neuropathy is a type of nerve damage. It affects nerves that carry signals between the spinal cord and the arms, legs, and the rest of the body (peripheral nerves). It does not affect nerves in the spinal cord or brain. In peripheral neuropathy, one nerve or a group of nerves may be damaged. Peripheral neuropathy is a broad category that includes many specific nerve disorders, like diabetic neuropathy, hereditary neuropathy, and carpal tunnel syndrome. What are the causes? This condition may be caused by: Certain diseases, such as: Diabetes. This is the most common cause of peripheral neuropathy. Autoimmune diseases, such as rheumatoid arthritis and systemic lupus erythematosus. Nerve diseases that are passed from parent to child (inherited). Kidney disease. Thyroid  disease. Other causes may include: Nerve injury. Pressure or stress on a nerve that lasts a long time. Lack (deficiency) of B vitamins. This can result from alcoholism, poor diet, or a restricted diet. Infections. Some medicines, such as cancer medicines (chemotherapy). Poisonous (toxic) substances, such as lead and mercury. Too little blood flowing to the legs. In some cases, the cause of this condition is not known. What are the signs or symptoms? Symptoms of this condition depend on which of your nerves is damaged. Symptoms in the legs, hands, and arms can include: Loss of feeling (numbness) in the feet, hands, or both. Tingling in the feet, hands, or both. Burning pain. Very sensitive skin. Weakness. Not being able to move a part of the body (paralysis). Clumsiness or poor coordination. Muscle twitching. Loss of  balance. Symptoms in other parts of the body can include: Not being able to control your bladder. Feeling dizzy. Sexual problems. How is this diagnosed? Diagnosing and finding the cause of peripheral neuropathy can be difficult. Your health care provider will take your medical history and do a physical exam. A neurological exam will also be done. This involves checking things that are affected by your brain, spinal cord, and nerves (nervous system). For example, your health care provider will check your reflexes, how you move, and what you can feel. You may have other tests, such as: Blood tests. Electromyogram (EMG) and nerve conduction tests. These tests check nerve function and how well the nerves are controlling the muscles. Imaging tests, such as a CT scan or MRI, to rule out other causes of your symptoms. Removing a small piece of nerve to be examined in a lab (nerve biopsy). Removing and examining a small amount of the fluid that surrounds the brain and spinal cord (lumbar puncture). How is this treated? Treatment for this condition may involve: Treating the underlying cause of the neuropathy, such as diabetes, kidney disease, or vitamin deficiencies. Stopping medicines that can cause neuropathy, such as chemotherapy. Medicine to help relieve pain. Medicines may include: Prescription or over-the-counter pain medicine. Anti-seizure medicine. Antidepressants. Pain-relieving patches that are applied to painful areas of skin. Surgery to relieve pressure on a nerve or to destroy a nerve that is causing pain. Physical therapy to help improve movement and balance. Devices to help you move around (assistive devices). Follow these instructions at home: Medicines Take over-the-counter and prescription medicines only as told by your health care provider. Do not take any other medicines without first asking your health care provider. Ask your health care provider  if the medicine prescribed to  you requires you to avoid driving or using machinery. Lifestyle  Do not use any products that contain nicotine  or tobacco. These products include cigarettes, chewing tobacco, and vaping devices, such as e-cigarettes. Smoking keeps blood from reaching damaged nerves. If you need help quitting, ask your health care provider. Avoid or limit alcohol. Too much alcohol can cause a vitamin B deficiency, and vitamin B is needed for healthy nerves. Eat a healthy diet. This includes: Eating foods that are high in fiber, such as beans, whole grains, and fresh fruits and vegetables. Limiting foods that are high in fat and processed sugars, such as fried or sweet foods. General instructions  If you have diabetes, work closely with your health care provider to keep your blood sugar under control. If you have numbness in your feet: Check every day for signs of injury or infection. Watch for redness, warmth, and swelling. Wear padded socks and comfortable shoes. These help protect your feet. Develop a good support system. Living with peripheral neuropathy can be stressful. Consider talking with a mental health specialist or joining a support group. Use assistive devices and attend physical therapy as told by your health care provider. This may include using a walker or a cane. Keep all follow-up visits. This is important. Where to find more information [employer] of Neurological Disorders: [employer] Contact a health care provider if: You have new signs or symptoms of peripheral neuropathy. You are struggling emotionally from dealing with peripheral neuropathy. Your pain is not well controlled. Get help right away if: You have an injury or infection that is not healing normally. You develop new weakness in an arm or leg. You have fallen or do so frequently. Summary Peripheral neuropathy is when the nerves in the arms or legs are damaged, resulting in numbness, weakness, or pain. There are  many causes of peripheral neuropathy, including diabetes, pinched nerves, vitamin deficiencies, autoimmune disease, and hereditary conditions. Diagnosing and finding the cause of peripheral neuropathy can be difficult. Your health care provider will take your medical history, do a physical exam, and do tests, including blood tests and nerve function tests. Treatment involves treating the underlying cause of the neuropathy and taking medicines to help control pain. Physical therapy and assistive devices may also help. This information is not intended to replace advice given to you by your health care provider. Make sure you discuss any questions you have with your health care provider. Document Revised: 01/07/2021 Document Reviewed: 01/07/2021 Elsevier Patient Education  2024 [employer].

## 2023-12-20 ENCOUNTER — Encounter: Payer: Self-pay | Admitting: Cardiovascular Disease

## 2023-12-30 ENCOUNTER — Telehealth: Payer: Self-pay

## 2023-12-30 NOTE — Telephone Encounter (Signed)
 Domperidone rx faxed to [place]

## 2023-12-30 NOTE — Telephone Encounter (Signed)
 Reviewed. Ok to refill without OV. Thanks

## 2023-12-30 NOTE — Telephone Encounter (Signed)
 Refill request for Domperidone.  Last office visit 06/10/2022.  I assume she needs an office visit before I can refill this?  Please advise.  Thanks!

## 2024-01-05 ENCOUNTER — Other Ambulatory Visit: Payer: Self-pay

## 2024-01-05 ENCOUNTER — Observation Stay (HOSPITAL_BASED_OUTPATIENT_CLINIC_OR_DEPARTMENT_OTHER)
Admission: EM | Admit: 2024-01-05 | Discharge: 2024-01-06 | Disposition: A | Discharge status: Home / Self Care | Source: Ambulatory Visit | Attending: Emergency Medicine | Admitting: Emergency Medicine

## 2024-01-05 ENCOUNTER — Encounter (HOSPITAL_BASED_OUTPATIENT_CLINIC_OR_DEPARTMENT_OTHER): Payer: Self-pay | Admitting: Emergency Medicine

## 2024-01-05 DIAGNOSIS — K922 Gastrointestinal hemorrhage, unspecified: Principal | ICD-10-CM | POA: Diagnosis present

## 2024-01-05 DIAGNOSIS — E876 Hypokalemia: Secondary | ICD-10-CM | POA: Insufficient documentation

## 2024-01-05 DIAGNOSIS — K625 Hemorrhage of anus and rectum: Secondary | ICD-10-CM | POA: Diagnosis present

## 2024-01-05 DIAGNOSIS — J449 Chronic obstructive pulmonary disease, unspecified: Secondary | ICD-10-CM | POA: Insufficient documentation

## 2024-01-05 DIAGNOSIS — I480 Paroxysmal atrial fibrillation: Secondary | ICD-10-CM | POA: Insufficient documentation

## 2024-01-05 DIAGNOSIS — Z7982 Long term (current) use of aspirin: Secondary | ICD-10-CM | POA: Diagnosis not present

## 2024-01-05 LAB — CBC WITH DIFFERENTIAL/PLATELET
Abs Immature Granulocytes: 0.04 K/uL (ref 0.00–0.07)
Basophils Absolute: 0 K/uL (ref 0.0–0.1)
Basophils Relative: 0 %
Eosinophils Absolute: 0 K/uL (ref 0.0–0.5)
Eosinophils Relative: 0 %
HCT: 38.5 % (ref 36.0–46.0)
Hemoglobin: 12.7 g/dL (ref 12.0–15.0)
Immature Granulocytes: 0 %
Lymphocytes Relative: 10 %
Lymphs Abs: 1.2 K/uL (ref 0.7–4.0)
MCH: 30.5 pg (ref 26.0–34.0)
MCHC: 33 g/dL (ref 30.0–36.0)
MCV: 92.5 fL (ref 80.0–100.0)
Monocytes Absolute: 1 K/uL (ref 0.1–1.0)
Monocytes Relative: 8 %
Neutro Abs: 9.6 K/uL — ABNORMAL HIGH (ref 1.7–7.7)
Neutrophils Relative %: 82 %
Platelets: 390 K/uL (ref 150–400)
RBC: 4.16 MIL/uL (ref 3.87–5.11)
RDW: 13.3 % (ref 11.5–15.5)
WBC: 11.9 K/uL — ABNORMAL HIGH (ref 4.0–10.5)
nRBC: 0 % (ref 0.0–0.2)

## 2024-01-05 LAB — COMPREHENSIVE METABOLIC PANEL WITH GFR
ALT: 16 U/L (ref 0–44)
AST: 17 U/L (ref 15–41)
Albumin: 4.3 g/dL (ref 3.5–5.0)
Alkaline Phosphatase: 66 U/L (ref 38–126)
Anion gap: 13 (ref 5–15)
BUN: 12 mg/dL (ref 8–23)
CO2: 25 mmol/L (ref 22–32)
Calcium: 10.2 mg/dL (ref 8.9–10.3)
Chloride: 96 mmol/L — ABNORMAL LOW (ref 98–111)
Creatinine, Ser: 0.56 mg/dL (ref 0.44–1.00)
GFR, Estimated: >60 mL/min (ref >60)
Glucose, Bld: 115 mg/dL — ABNORMAL HIGH (ref 70–99)
Potassium: 4 mmol/L (ref 3.5–5.1)
Sodium: 133 mmol/L — ABNORMAL LOW (ref 135–145)
Total Bilirubin: 0.3 mg/dL (ref 0.0–1.2)
Total Protein: 7 g/dL (ref 6.5–8.1)

## 2024-01-05 LAB — HEMOGLOBIN AND HEMATOCRIT, BLOOD
HCT: 37.3 % (ref 36.0–46.0)
Hemoglobin: 12.1 g/dL (ref 12.0–15.0)

## 2024-01-05 MED ORDER — ALBUTEROL SULFATE (2.5 MG/3ML) 0.083% IN NEBU: 2.5000 mg | INHALATION_SOLUTION | RESPIRATORY_TRACT | Status: DC | PRN

## 2024-01-05 MED ORDER — ONDANSETRON HCL 4 MG PO TABS: 4.0000 mg | ORAL_TABLET | Freq: Four times a day (QID) | ORAL | Status: DC | PRN

## 2024-01-05 MED ORDER — ONDANSETRON HCL 4 MG/2ML IJ SOLN: 4.0000 mg | Freq: Four times a day (QID) | INTRAMUSCULAR | Status: DC | PRN

## 2024-01-05 MED ORDER — PANTOPRAZOLE SODIUM 40 MG IV SOLR
40.0000 mg | INTRAVENOUS | Status: DC
  Filled 2024-01-05: qty 10

## 2024-01-05 MED ORDER — ACETAMINOPHEN 650 MG RE SUPP: 650.0000 mg | Freq: Four times a day (QID) | RECTAL | Status: DC | PRN

## 2024-01-05 MED ORDER — DILTIAZEM HCL ER COATED BEADS 240 MG PO CP24
360.0000 mg | ORAL_CAPSULE | Freq: Every day | ORAL | Status: DC
  Administered 2024-01-06: 360 mg via ORAL
  Filled 2024-01-05: qty 1

## 2024-01-05 MED ORDER — TRAZODONE HCL 50 MG PO TABS: 25.0000 mg | ORAL_TABLET | Freq: Every evening | ORAL | Status: DC | PRN

## 2024-01-05 MED ORDER — DILTIAZEM HCL ER BEADS 240 MG PO CP24: 360.0000 mg | ORAL_CAPSULE | Freq: Every day | ORAL | Status: DC

## 2024-01-05 MED ORDER — ACETAMINOPHEN 325 MG PO TABS: 650.0000 mg | ORAL_TABLET | Freq: Four times a day (QID) | ORAL | Status: DC | PRN

## 2024-01-05 NOTE — ED Provider Notes (Signed)
 I provided a substantive portion of the care of this patient.  I personally made/approved the management plan for this patient and take responsibility for the patient management.     Patient seen by me along with physician assistant.  Patient with blood in her underwear during the night.  At least 2 times.  On rectal exam here had a lot of dark red blood.  Patient stated historically that there was a prolapsed rectum.  By time she got here it was back in.  Patient hemodynamically stable here.  Hemoglobin 12.7.  Patient followed by [facility] gastroenterology.  They were consulted.  Patient will need hospitalist admission.   Jese Comella, MD 01/05/24 [phone]

## 2024-01-05 NOTE — ED Notes (Signed)
 Called [facility] to transport the patient to [facility] rm# [id number]

## 2024-01-05 NOTE — Plan of Care (Signed)
 Patient is a 78 year old female with history of hypertension, GERD, gastroparesis, COPD, chronic respiratory failure on 3 L of oxygen  at home, chronic constipation who presented to the ED at [facility] after her primary care physician referred her for further evaluation of lower GI bleed.  Apparently all right till yesterday evening when she had a bowel movement and felt that her rectum had prolapsed along with bloody bowel movement.  She continued to see spots of blood on wiping throughout the night.  This morning, she had a bloody diarrhea but noticed that the rectal prolapse was gone.  Patient was seen by her PCP in the office and there was concern for GI bleed so patient was referred to ED.  On presentation, she was hemodynamically stable with a stable hemoglobin of 12.  Rectal examination showed dark stool suspicious for GI bleed. Patient was requested for admission for further evaluation of lower GI bleed.  Will place her on observation.  GI has been consulted

## 2024-01-05 NOTE — H&P (Signed)
 History and Physical  Virginia Burns FMW:[id number] DOB: Feb 01, 1946 DOA: 01/05/2024  PCP: [name] CROME, NP   Chief Complaint: rectal bleeding   HPI: Virginia Burns is a 78 y.o. female with medical history significant for COPD chronically on 3 L nasal cannula oxygen , chronic constipation, GERD, gastroparesis, hypertension being admitted to the hospital with rectal prolapse and bright red blood per rectum.  Patient states that she was in her usual state of health until yesterday evening when she had a bloody bowel movement and felt that her rectum had prolapsed.  She states that she was doing bright red blood per rectum through the night, had 3 more bloody bowel movements.  By the morning, she noticed that her prolapse had resolved.  She had another bowel movement this morning which is not as bloody, she only saw blood when she wiped.  She presented to the emergency department, where she was hemodynamically stable, with a stable hemoglobin.  Review of Systems: Please see HPI for pertinent positives and negatives. A complete 10 system review of systems are otherwise negative.  Past Medical History:  Diagnosis Date   Adjustment disorder with anxiety 08/17/2013   Allergic rhinitis    Anxiety state 08/17/2013   Overview:  IMPRESSION: hx of anxiety, lost a friend and has friend in ICU, using the ativan  prn. had refilled 07/27/13   Aortic ejection murmur 07/14/2018   Benign neoplasm of ascending colon 12/07/2014   Benign neoplasm of descending colon 12/07/2014   Centrilobular emphysema (HCC) 12/07/2014   Chronic hyponatremia 10/05/2018   Last Assessment & Plan:  Formatting of this note is different from the original. Recent Labs    10/05/18 0007 10/06/18 0504 10/07/18 0733  NA 132* 131* 135   Baseline ~ 132   -stable   Chronic respiratory failure (HCC) 03/04/2015   Closed fracture of sacrum and coccyx (HCC) 10/03/2018   Last Assessment & Plan:  Formatting of this note might be different from the  original. Patient presented with progressively worsening lower back pain and inability to ambulate  MR: Acute fracture of the right greater than left sacral ala and S2 segment. CT: Nondisplaced, vertical and transverse acute sacral insufficiency Fractures  -s/p perQ fixation on 5/20  --Management per ortho primary team: P   Complication of anesthesia    Versed - hard time working - dinging for days - Colonoscopy   Constipation 12/09/2012   Formatting of this note might be different from the original. IMPRESSION: increased constipation: trial of amitiza, to push fiber, can use mirlax prn.  eat 4 prunes a day. call prn. has had colonoscopy in past. is due next year for f/u   COPD (chronic obstructive pulmonary disease) (HCC) 02/10/2013   COPD (chronic obstructive pulmonary disease) with chronic bronchitis (HCC) 02/10/2013   Overnight pulse oximetry shows desaturations greater than 5 minutes at a time less than 88%.  Order signed for nocturnal O2 at 2 L/m February 21, 2016  Overview:  Overnight pulse oximetry shows desaturations greater than 5 minutes at a time less than 88%.  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  therapy 12/07/2014   Diverticulosis of colon 10/12/2007   Qualifier: Diagnosis of  By: Kowalk CMA (AAMA), Chick     Essential tremor 05/22/2013  Gastroparesis    GERD (gastroesophageal reflux disease)    GERD with esophagitis 10/12/2007   Qualifier: Diagnosis of  By: Kowalk CMA (AAMA), Chick    Overview:  Overview:  Qualifier: Diagnosis of  By: Kowalk CMA LEODIS), Chick  Overview:  Overview:  Qualifier: Diagnosis of  By: Genie CMA LEODIS), Leisha    History of tobacco abuse 09/18/2013   Humerus distal fracture 01/24/2018   Hx of colonic polyps    Hypertension     Hypertension, benign 07/19/2007   Qualifier: Diagnosis of  By: Jacqueline Riggs    Overview:  Overview:  STORY: The goal for blood pressure is less than 140/90.  If you are checking your blood pressure at home, please record it and bring it to your next office visit. 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  I have reviewed the recent sleep study results with the patient.  We discussed how sleep apnea can affect various health problems including risks for hypertension, cardiovascular disease, and diabetes.  We also discussed how sleep disruption can increase risks for accident, such as while driving.  Weight loss as a means of improving sl   OSA (obstructive sleep apnea) 09/18/2013   Osteoarthrosis 12/09/2012   Osteopenia of multiple sites 10/12/2007   Qualifier: Diagnosis of  By: Kowalk CMA (AAMA), Leisha     Osteoporosis    Palpitations 07/14/2018   Personal history of colonic polyps 10/07/2009   tubular adenoma   Tachycardia, paroxysmal (HCC) 07/04/2018   Urinary tract infection    Vitamin B12 deficiency    Vitamin D  deficiency 12/09/2012   Overview:  Overview:  IMPRESSION: Appropiate labs done today. We will send the results and adjust treatment as needed. Bone density discussed. There has  been an improvement in her bone density comparing the one in 2009 from the one 2011. From osteoporotic she went to osteopenic. She did take Actonel 3 years ago for several years and stopped it due to GI side effects. We will continue mantaining f   Past Surgical  History:  Procedure Laterality Date   ABDOMINAL HYSTERECTOMY     ABDOMINAL HYSTERECTOMY     CATARACT EXTRACTION Right    CHOLECYSTECTOMY     COLONOSCOPY WITH PROPOFOL  N/A 11/21/2014   Procedure: COLONOSCOPY WITH PROPOFOL ;  Surgeon: Norleen LOISE Kiang, MD;  Location: [facility] ENDOSCOPY;  Service: Endoscopy;  Laterality: N/A;   FRACTURE SURGERY     GALLBLADDER SURGERY  1986   LIGAMENT REPAIR Left 04/18/2013   Procedure: Triangular Fibrocartilage complex open repair;  Surgeon: Alm DELENA Hummer, MD;  Location: [facility];  Service: Orthopedics;  Laterality: Left;   OOPHORECTOMY     OPEN REDUCTION INTERNAL FIXATION (ORIF) DISTAL RADIAL FRACTURE Left 04/18/2013   Procedure: LEFT OPEN REDUCTION INTERNAL FIXATION (ORIF) DISTAL RADIAL FRACTURE;  Surgeon: Alm DELENA Hummer, MD;  Location: [facility];  Service: Orthopedics;  Laterality: Left;   ORIF ELBOW FRACTURE Left 01/24/2018   Procedure: OPEN REDUCTION INTERNAL FIXATION (ORIF) DISTAL HUMERUS  ELBOW/OLECRANON OSTEOTOMY FRACTURE;  Surgeon: Barbarann Oneil BROCKS, MD;  Location: [facility] OR;  Service: Orthopedics;  Laterality: Left;   PELVIC FRACTURE SURGERY  2020   at [facility]   Social History:  reports that she quit smoking about 14 years ago. Her smoking use included cigarettes. She started smoking about 39 years ago. She has a 25 pack-year smoking history. She has never used smokeless tobacco. She reports that she does not currently use alcohol after a past usage of about 2.0 standard drinks of alcohol per week. She reports that she does not use drugs.  Allergies  Allergen Reactions   Clindamycin/Lincomycin Hives   Bactrim [Sulfamethoxazole-Trimethoprim] Swelling    swelling   Doxycycline Other (See Comments)    Tooth/ gum pain   Elemental Sulfur Rash    Family History  Problem Relation Age of Onset   COPD Mother        Deceased, 39   Tremor Mother    Emphysema Mother    Other Father        Deceased, 79   Diabetes  Brother    Colitis Son    Diabetes type II Son    Tremor Maternal Aunt    Colon cancer Cousin    Esophageal cancer Neg Hx    Liver cancer Neg Hx    Pancreatic cancer Neg Hx    Neuropathy Neg Hx      Prior to Admission medications   Medication Sig Start Date End Date Taking? Authorizing Provider  AMBULATORY NON FORMULARY MEDICATION Take 10 mg by mouth 3 (three) times daily before meals. Medication Name: Domperidone 12/24/22   Abran Norleen SAILOR, MD  Ascorbic Acid  (VITAMIN C ) 1000 MG tablet Take 1,000 mg by mouth daily.    [provider]  aspirin  EC 81 MG tablet Take 81 mg by mouth daily.    [provider]  budesonide -formoterol  (SYMBICORT ) 160-4.5 MCG/ACT inhaler Inhale 2 puffs into the lungs 2 (two) times daily. 01/04/18   Sood, Vineet, MD  CALCIUM  PO Take 600 mg by mouth daily.    [provider]  Cholecalciferol  25 MCG (1000 UT) tablet Take 2,000 Units by mouth daily. 11/07/18   [provider]  Cranberry 1000 MG CAPS Take 1,000 mg by mouth daily. Patient taking differently: Take 2,000 mg by mouth daily.    [provider]  Cranberry-Vitamin C -Probiotic (AZO CRANBERRY PO) Take by mouth 2 (two) times daily.    [provider]  denosumab  (PROLIA ) 60 MG/ML SOSY injection Inject 60 mg into the skin every 6 (six) months. 11/09/19   [provider]  diltiazem  (CARDIZEM ) 30 MG tablet Take 1 tablet (30 mg total) by mouth as needed (for HR above 100). 12/26/21   O'NealDarryle Ned, MD  diltiazem  (TIAZAC ) 360 MG 24 hr capsule Take 360 mg by mouth daily. 06/05/23   [provider]  Flaxseed, Linseed, (FLAX SEED OIL) 1000 MG CAPS Take 2,000 mg by mouth daily.    [provider]  gabapentin (NEURONTIN) 600 MG tablet Take 0.5 tablets by mouth 3 (three) times daily. 09/04/19   [provider]  pantoprazole  (PROTONIX ) 40 MG tablet Take 1 tablet by mouth 2 (two) times daily. Gastroparesis 10/28/20   [provider]   Tiotropium Bromide  Monohydrate (SPIRIVA  RESPIMAT) 2.5 MCG/ACT AERS INHALE 2 SPRAY(S) BY MOUTH ONCE DAILY 10/19/22   Sood, Vineet, MD  vitamin B-12 (CYANOCOBALAMIN) 1000 MCG tablet Take 2,000 mcg by mouth daily.    [provider]    Physical Exam: BP 125/65 (BP Location:  Right Arm)   Pulse 73   Temp 98.3 F (36.8 C) (Oral)   Resp 16   SpO2 100%  General:  Alert, oriented, calm, in no acute distress  Eyes: EOMI, clear conjuctivae, white sclerea Neck: supple, no masses, trachea mildline  Cardiovascular: RRR, no murmurs or rubs, no peripheral edema  Respiratory: clear to auscultation bilaterally, no wheezes, no crackles  Abdomen: soft, nontender, nondistended, normal bowel tones heard  Skin: dry, no rashes  Musculoskeletal: no joint effusions, normal range of motion  Psychiatric: appropriate affect, normal speech  Neurologic: extraocular muscles intact, clear speech, moving all extremities with intact sensorium         Labs on Admission:  Basic Metabolic Panel: Recent Labs  Lab 01/05/24 1530  NA 133*  K 4.0  CL 96*  CO2 25  GLUCOSE 115*  BUN 12  CREATININE 0.56  CALCIUM  10.2   Liver Function Tests: Recent Labs  Lab 01/05/24 1530  AST 17  ALT 16  ALKPHOS 66  BILITOT 0.3  PROT 7.0  ALBUMIN 4.3   No results for input(s): LIPASE, AMYLASE in the last 168 hours. No results for input(s): AMMONIA in the last 168 hours. CBC: Recent Labs  Lab 01/05/24 1530  WBC 11.9*  NEUTROABS 9.6*  HGB 12.7  HCT 38.5  MCV 92.5  PLT 390   Cardiac Enzymes: No results for input(s): CKTOTAL, CKMB, CKMBINDEX, TROPONINI in the last 168 hours. BNP (last 3 results) No results for input(s): BNP in the last 8760 hours.  ProBNP (last 3 results) No results for input(s): PROBNP in the last 8760 hours.  CBG: No results for input(s): GLUCAP in the last 168 hours.  Radiological Exams on Admission: No results found. Assessment/Plan [place] Burns is a 78  y.o. female with medical history significant for COPD chronically on 3 L nasal cannula oxygen , chronic constipation, GERD, gastroparesis, hypertension being admitted to the hospital with rectal prolapse and bright red blood per rectum.  Bright red blood per rectum-with rectal prolapse which patient states has resolved.  Currently denies any bleeding.  Patient is hemodynamically stable, hemoglobin unchanged. -Observation admission -Clear liquid diet -Avoid blood thinners, patient takes baby aspirin  at home -Trend hemoglobin every 8 hours -N.p.o. after midnight -[facility] will consult in the morning  Paroxysmal A-fib-continue home Cardizem   COPD-continue chronic oxygen , no evidence of acute exacerbation  DVT prophylaxis: SCDs only    Code Status: Full Code  Consults called: [facility]  Admission status: Observation  Time spent: 48 minutes  Aaren Atallah CHRISTELLA Gail MD [facility] Pager [phone]  If 7PM-7AM, please contact night-coverage [email or web address] Password [facility]  01/05/2024, 6:31 PM

## 2024-01-05 NOTE — ED Provider Notes (Signed)
 [facility] AT [facility] Provider Note   CSN: [id number] Arrival date & time: 01/05/24  1353     Patient presents with: Rectal Pain   Virginia Burns is a 78 y.o. female with PMHx HTN, GERD, gastroparesis, COPD and chronic respiratory failure on 3L O2 at baseline, constipation who presents to ED concerned for rectal pain and bleeding since 7PM last night. Patient was having a BM last night - when she went to wipe, she states that she felt a prolapsing of her colon through her rectum. Patient states that she also noticed bleeding from her rectum. Patient stating that she went to bed but woke up at least 3 times last night to change her briefs d/t the blood sitting in her briefs. Patient then had an episode of bloody diarrhea this morning and noticed that the prolapse was gone. Patient currently asymptomatic, but did go to her PCP appointment today who did a thorough exam, saw some blood, but no other acute concern. Patient's PCP told her to come to ED for further evaluation out of abundance of precaution. Patient currently without any fever, nausea, vomiting, diarrhea, or abdominal pain.   Patient later having a small BM in ED and stating that there was more blood in her stool.     HPI     Prior to Admission medications   Medication Sig Start Date End Date Taking? Authorizing Provider  AMBULATORY NON FORMULARY MEDICATION Take 10 mg by mouth 3 (three) times daily before meals. Medication Name: Domperidone 12/24/22   Abran Norleen SAILOR, MD  Ascorbic Acid  (VITAMIN C ) 1000 MG tablet Take 1,000 mg by mouth daily.    [provider]  aspirin  EC 81 MG tablet Take 81 mg by mouth daily.    [provider]  budesonide -formoterol  (SYMBICORT ) 160-4.5 MCG/ACT inhaler Inhale 2 puffs into the lungs 2 (two) times daily. 01/04/18   Sood, Vineet, MD  CALCIUM  PO Take 600 mg by mouth daily.    [provider]  Cholecalciferol  25 MCG (1000 UT) tablet Take  2,000 Units by mouth daily. 11/07/18   [provider]  Cranberry 1000 MG CAPS Take 1,000 mg by mouth daily. Patient taking differently: Take 2,000 mg by mouth daily.    [provider]  Cranberry-Vitamin C -Probiotic (AZO CRANBERRY PO) Take by mouth 2 (two) times daily.    [provider]  denosumab  (PROLIA ) 60 MG/ML SOSY injection Inject 60 mg into the skin every 6 (six) months. 11/09/19   [provider]  diltiazem  (CARDIZEM ) 30 MG tablet Take 1 tablet (30 mg total) by mouth as needed (for HR above 100). 12/26/21   O'NealDarryle Ned, MD  diltiazem  (TIAZAC ) 360 MG 24 hr capsule Take 360 mg by mouth daily. 06/05/23   [provider]  Flaxseed, Linseed, (FLAX SEED OIL) 1000 MG CAPS Take 2,000 mg by mouth daily.    [provider]  gabapentin (NEURONTIN) 600 MG tablet Take 0.5 tablets by mouth 3 (three) times daily. 09/04/19   [provider]  pantoprazole  (PROTONIX ) 40 MG tablet Take 1 tablet by mouth 2 (two) times daily. Gastroparesis 10/28/20   [provider]  Tiotropium Bromide  Monohydrate (SPIRIVA  RESPIMAT) 2.5 MCG/ACT AERS INHALE 2 SPRAY(S) BY MOUTH ONCE DAILY 10/19/22   Sood, Vineet, MD  vitamin B-12 (CYANOCOBALAMIN) 1000 MCG tablet Take 2,000 mcg by mouth daily.    [provider]    Allergies: Clindamycin/lincomycin, Bactrim [sulfamethoxazole-trimethoprim], Doxycycline, and Elemental sulfur    Review  of Systems  Gastrointestinal:  Positive for blood in stool.    Updated Vital Signs BP (!) 150/70   Pulse 91   Temp 98 F (36.7 C)   Resp 18   SpO2 99%   Physical Exam Vitals and nursing note reviewed.  Constitutional:      General: She is not in acute distress.    Appearance: She is not ill-appearing or toxic-appearing.  HENT:     Head: Normocephalic and atraumatic.     Mouth/Throat:     Mouth: Mucous membranes are moist.  Eyes:     General: No scleral icterus.       Right eye: No discharge.         Left eye: No discharge.     Conjunctiva/sclera: Conjunctivae normal.  Cardiovascular:     Rate and Rhythm: Normal rate.     Pulses: Normal pulses.     Heart sounds: No murmur heard. Pulmonary:     Effort: Pulmonary effort is normal. No respiratory distress.     Breath sounds: No wheezing, rhonchi or rales.  Abdominal:     Tenderness: There is no abdominal tenderness.  Genitourinary:    Comments: RN Tillman present to chaperone rectal exam Stool sample appears dark reddish/brown. No prolapse or fissure appreciated. Musculoskeletal:     Right lower leg: No edema.     Left lower leg: No edema.  Skin:    General: Skin is warm and dry.     Findings: No rash.  Neurological:     General: No focal deficit present.     Mental Status: She is alert and oriented to person, place, and time. Mental status is at baseline.  Psychiatric:        Mood and Affect: Mood normal.        Behavior: Behavior normal.     (all labs ordered are listed, but only abnormal results are displayed) Labs Reviewed  CBC WITH DIFFERENTIAL/PLATELET - Abnormal; Notable for the following components:      Result Value   WBC 11.9 (*)    Neutro Abs 9.6 (*)    All other components within normal limits  COMPREHENSIVE METABOLIC PANEL WITH GFR - Abnormal; Notable for the following components:   Sodium 133 (*)    Chloride 96 (*)    Glucose, Bld 115 (*)    All other components within normal limits    EKG: None  Radiology: No results found.   .Critical Care  Performed by: Hoy Nidia FALCON, PA-C Authorized by: Hoy Nidia FALCON, PA-C   Critical care provider statement:    Critical care time (minutes):  30   Critical care was necessary to treat or prevent imminent or life-threatening deterioration of the following conditions: lower GI bleed.   Critical care was time spent personally by me on the following activities:  Development of treatment plan with patient or surrogate, discussions with consultants,  evaluation of patient's response to treatment, examination of patient, ordering and review of laboratory studies, ordering and review of radiographic studies, ordering and performing treatments and interventions, pulse oximetry, re-evaluation of patient's condition and review of old charts   Care discussed with: admitting provider      Medications Ordered in the ED - No data to display                                  Medical Decision Making Amount and/or Complexity of Data  Reviewed Labs: ordered.  Risk Decision regarding hospitalization.   This patient presents to the ED for concern of diarrhea, this involves an extensive number of treatment options, and is a complaint that carries with it a high risk of complications and morbidity.  The differential diagnosis includes food poisoning, gastroenteritis, C.Diff, STEC, other bacterial cause, protozoa, viral infection, IBS, Crohn's disease.    Co morbidities that complicate the patient evaluation   HTN, GERD, gastroparesis, COPD and chronic respiratory failure on 3L O2 at baseline, constipation    Additional history obtained:  Additional history obtained from 11/2014 colonoscopy: 1. Two polyps were found in the descending colon and ascending colon; polypectomy was performed with a cold snare 2. Moderate diverticulosis was noted in the sigmoid colon 3. The examination was otherwise normal    Problem List / ED Course / Critical interventions / Medication management  Patient presents to ED concerned for hematochezia and rectal prolapse. Physical exam with dark reddish/brown stool. Rest of physical exam reassuring. Patient afebrile with stable vitals.  I Ordered, and personally interpreted labs.  CBC with mild leukocytosis at 11.9. no anemia. CMP with mild hyponatremia at 133 and Chloride mildly low at 96. Consulted with GI provider on-call Jessica Zehr who agrees to follow patient while admitted.  Dr. Jillian admitting provider. I have  reviewed the patients home medicines and have made adjustments as needed   Social Determinants of Health:  geriatric      Final diagnoses:  Gastrointestinal hemorrhage, unspecified gastrointestinal hemorrhage type    ED Discharge Orders     None          Hoy Nidia FALCON, [place] 01/05/24 1611    Geraldene Hamilton, MD 01/05/24 [phone]

## 2024-01-05 NOTE — ED Triage Notes (Signed)
 Reports prolapse rectum that is bleeding since 1900 last night.   Arrived wearing home oxygen  at 3L [place]. Baseline,

## 2024-01-06 DIAGNOSIS — K922 Gastrointestinal hemorrhage, unspecified: Principal | ICD-10-CM

## 2024-01-06 DIAGNOSIS — K623 Rectal prolapse: Secondary | ICD-10-CM

## 2024-01-06 DIAGNOSIS — K5909 Other constipation: Secondary | ICD-10-CM | POA: Diagnosis not present

## 2024-01-06 DIAGNOSIS — K625 Hemorrhage of anus and rectum: Secondary | ICD-10-CM | POA: Diagnosis not present

## 2024-01-06 LAB — CBC
HCT: 37.8 % (ref 36.0–46.0)
Hemoglobin: 12.1 g/dL (ref 12.0–15.0)
MCH: 30 pg (ref 26.0–34.0)
MCHC: 32 g/dL (ref 30.0–36.0)
MCV: 93.8 fL (ref 80.0–100.0)
Platelets: 361 K/uL (ref 150–400)
RBC: 4.03 MIL/uL (ref 3.87–5.11)
RDW: 13.3 % (ref 11.5–15.5)
WBC: 10.9 K/uL — ABNORMAL HIGH (ref 4.0–10.5)
nRBC: 0 % (ref 0.0–0.2)

## 2024-01-06 LAB — BASIC METABOLIC PANEL WITH GFR
Anion gap: 12 (ref 5–15)
BUN: 11 mg/dL (ref 8–23)
CO2: 26 mmol/L (ref 22–32)
Calcium: 8.9 mg/dL (ref 8.9–10.3)
Chloride: 96 mmol/L — ABNORMAL LOW (ref 98–111)
Creatinine, Ser: 0.48 mg/dL (ref 0.44–1.00)
GFR, Estimated: >60 mL/min (ref >60)
Glucose, Bld: 92 mg/dL (ref 70–99)
Potassium: 3.3 mmol/L — ABNORMAL LOW (ref 3.5–5.1)
Sodium: 134 mmol/L — ABNORMAL LOW (ref 135–145)

## 2024-01-06 MED ORDER — POTASSIUM CHLORIDE CRYS ER 20 MEQ PO TBCR
40.0000 meq | EXTENDED_RELEASE_TABLET | Freq: Once | ORAL | Status: AC
  Administered 2024-01-06: 40 meq via ORAL
  Filled 2024-01-06: qty 2

## 2024-01-06 MED ORDER — POTASSIUM CHLORIDE 10 MEQ/100ML IV SOLN
10.0000 meq | INTRAVENOUS | Status: DC
  Administered 2024-01-06: 10 meq via INTRAVENOUS
  Filled 2024-01-06 (×4): qty 100

## 2024-01-06 MED ORDER — SODIUM CHLORIDE 0.9 % IV SOLN: INTRAVENOUS | Status: DC

## 2024-01-06 NOTE — Progress Notes (Signed)
 Patient was given discharge instructions, and all questions were answered.  Patient was stable for discharge and was taken to the main exit by wheelchair.

## 2024-01-06 NOTE — Consult Note (Signed)
 Referring Provider: EDP Primary Care Physician:  [name] CROME, NP Primary Gastroenterologist:  Dr. Abran  Reason for Consultation:  Lower GI bleed  HPI: Virginia Burns is a 78 y.o. female with history of hypertension, GERD, gastroparesis, COPD, chronic respiratory failure on 3 L of oxygen  at home, chronic constipation who presented to the ED at [facility] after her primary care physician referred her for further evaluation of lower GI bleed. Apparently she was well until 8/19 evening when she had a bowel movement and felt that her rectum had prolapsed along with bloody bowel movement. She continued to see spots of blood on wiping throughout the night and had blood in her undergarments.  On 8/20 AM, she had a bloody diarrhea but noticed that the rectal prolapse was gone. Patient was seen by her PCP in the office and patient was referred to ED.   Hemoglobin has remained normal at 12.1 g.  Colonoscopy in July 2016:  1. Two polyps were found in the descending colon and ascending colon; polypectomy was performed with a cold snare 2. Moderate diverticulosis was noted in the sigmoid colon 3. The examination was otherwise normal  Colon, polyp(s), ascending colon polyp - TUBULAR ADENOMA, TWO FRAGMENTS. - BENIGN COLORECTAL MUCOSA, ONE FRAGMENT. - NO HIGH GRADE DYSPLASIA OR MALIGNANCY IDENTIFIED.  She tells me that she has constipation, sometimes 3-4 days without a BM.  Then will have episodes of diarrhea.  Says that she saw Dr. Abran for a colonoscopy in 2021 and he declined to do it due to her advanced lung disease.    She wants to eat and go home.  Had a large blow-out without blood.   Past Medical History:  Diagnosis Date   Adjustment disorder with anxiety 08/17/2013   Allergic rhinitis    Anxiety state 08/17/2013   Overview:  IMPRESSION: hx of anxiety, lost a friend and has friend in ICU, using the ativan  prn. had refilled 07/27/13   Aortic ejection murmur 07/14/2018   Benign neoplasm  of ascending colon 12/07/2014   Benign neoplasm of descending colon 12/07/2014   Centrilobular emphysema (HCC) 12/07/2014   Chronic hyponatremia 10/05/2018   Last Assessment & Plan:  Formatting of this note is different from the original. Recent Labs    10/05/18 0007 10/06/18 0504 10/07/18 0733  NA 132* 131* 135   Baseline ~ 132   -stable   Chronic respiratory failure (HCC) 03/04/2015   Closed fracture of sacrum and coccyx (HCC) 10/03/2018   Last Assessment & Plan:  Formatting of this note might be different from the original. Patient presented with progressively worsening lower back pain and inability to ambulate  MR: Acute fracture of the right greater than left sacral ala and S2 segment. CT: Nondisplaced, vertical and transverse acute sacral insufficiency Fractures  -s/p perQ fixation on 5/20  --Management per ortho primary team: P   Complication of anesthesia    Versed - hard time working - dinging for days - Colonoscopy   Constipation 12/09/2012   Formatting of this note might be different from the original. IMPRESSION: increased constipation: trial of amitiza, to push fiber, can use mirlax prn.  eat 4 prunes a day. call prn. has had colonoscopy in past. is due next year for f/u   COPD (chronic obstructive pulmonary disease) (HCC) 02/10/2013   COPD (chronic obstructive pulmonary disease) with chronic bronchitis (HCC) 02/10/2013   Overnight pulse oximetry shows desaturations greater than 5 minutes at a time less than 88%.  Order signed for nocturnal  O2 at 2 L/m February 21, 2016  Overview:  Overnight pulse oximetry shows desaturations greater than 5 minutes at a time less than 88%.  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  therapy 12/07/2014   Diverticulosis of colon  10/12/2007   Qualifier: Diagnosis of  By: Genie CMA (AAMA), Chick     Essential tremor 05/22/2013   Gastroparesis    GERD (gastroesophageal reflux disease)    GERD with esophagitis 10/12/2007   Qualifier: Diagnosis of  By: Kowalk CMA (AAMA), Chick    Overview:  Overview:  Qualifier: Diagnosis of  By: Kowalk CMA LEODIS), Chick  Overview:  Overview:  Qualifier: Diagnosis of  By: Genie CMA LEODIS), Leisha    History of tobacco abuse 09/18/2013   Humerus distal fracture 01/24/2018   Hx of colonic polyps    Hypertension    Hypertension, benign 07/19/2007   Qualifier: Diagnosis of  By: Jacqueline Riggs    Overview:  Overview:  STORY: The goal for blood pressure is less than 140/90.  If you are checking your blood pressure at home, please record it and bring it to your next office visit. 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  I have reviewed the recent sleep study results with the patient.  We discussed how sleep apnea can affect various health problems including risks for hypertension, cardiovascular disease, and diabetes.  We also discussed how sleep disruption can increase risks for accident, such as while driving.  Weight loss as a means of improving sl   OSA (obstructive sleep apnea) 09/18/2013   Osteoarthrosis 12/09/2012   Osteopenia of multiple sites 10/12/2007   Qualifier: Diagnosis of  By: Kowalk CMA (AAMA), Leisha     Osteoporosis    Palpitations 07/14/2018   Personal history of colonic polyps 10/07/2009   tubular adenoma   Tachycardia, paroxysmal (HCC)  07/04/2018   Urinary tract infection    Vitamin B12 deficiency    Vitamin D  deficiency 12/09/2012   Overview:  Overview:  IMPRESSION: Appropiate labs done today. We will send the results and adjust treatment as needed. Bone density discussed. There has  been an improvement in her bone density comparing the one in 2009 from the one 2011. From osteoporotic she went to osteopenic. She did take Actonel 3 years ago for several years and stopped it due to GI side effects. We will continue mantaining f    Past Surgical History:  Procedure Laterality Date   ABDOMINAL HYSTERECTOMY     ABDOMINAL HYSTERECTOMY     CATARACT EXTRACTION Right    CHOLECYSTECTOMY     COLONOSCOPY WITH PROPOFOL  N/A 11/21/2014   Procedure: COLONOSCOPY WITH PROPOFOL ;  Surgeon: Norleen LOISE Kiang, MD;  Location[facility];  Service: Endoscopy;  Laterality: N/A;   FRACTURE SURGERY     GALLBLADDER SURGERY  1986   LIGAMENT  REPAIR Left 04/18/2013   Procedure: Triangular Fibrocartilage complex open repair;  Surgeon: Alm DELENA Hummer, MD;  Location: [facility];  Service: Orthopedics;  Laterality: Left;   OOPHORECTOMY     OPEN REDUCTION INTERNAL FIXATION (ORIF) DISTAL RADIAL FRACTURE Left 04/18/2013   Procedure: LEFT OPEN REDUCTION INTERNAL FIXATION (ORIF) DISTAL RADIAL FRACTURE;  Surgeon: Alm DELENA Hummer, MD;  Location: [facility];  Service: Orthopedics;  Laterality: Left;   ORIF ELBOW FRACTURE Left 01/24/2018   Procedure: OPEN REDUCTION INTERNAL FIXATION (ORIF) DISTAL HUMERUS  ELBOW/OLECRANON OSTEOTOMY FRACTURE;  Surgeon: Barbarann Oneil BROCKS, MD;  Location: [facility] OR;  Service: Orthopedics;  Laterality: Left;   PELVIC FRACTURE SURGERY  2020   at [facility] in [place]    Prior to Admission medications   Medication Sig Start Date End Date Taking? Authorizing Provider  AMBULATORY NON FORMULARY MEDICATION Take 10 mg by mouth 3 (three) times daily before meals. Medication Name: Domperidone Patient taking  differently: Take 10 mg by mouth See admin instructions. Medication Name: Domperidone - Take 10 mg by mouth three times a day before meals 12/24/22  Yes Abran Norleen SAILOR, MD  Ascorbic Acid  (VITAMIN C ) 1000 MG tablet Take 1,000 mg by mouth daily.   Yes [provider]  aspirin  EC 81 MG tablet Take 81 mg by mouth daily.   Yes [provider]  budesonide -formoterol  (SYMBICORT ) 160-4.5 MCG/ACT inhaler Inhale 2 puffs into the lungs 2 (two) times daily. 01/04/18  Yes Shellia Oh, MD  CALCIUM  PO Take 600 mg by mouth 2 (two) times daily.   Yes [provider]  Cholecalciferol  (VITAMIN D3) 50 MCG (2000 UT) TABS Take 2,000 Units by mouth in the morning.   Yes [provider]  Cranberry 1000 MG CAPS Take 1,000 mg by mouth daily.   Yes [provider]  Cranberry-Vitamin C -Probiotic (AZO CRANBERRY PO) Take 1 tablet by mouth 2 (two) times daily.   Yes [provider]  denosumab  (PROLIA ) 60 MG/ML SOSY injection Inject 60 mg into the skin every 6 (six) months. 11/09/19  Yes [provider]  diltiazem  (CARDIZEM ) 30 MG tablet Take 1 tablet (30 mg total) by mouth as needed (for HR above 100). 12/26/21  Yes O'Neal, Darryle Ned, MD  diltiazem  (TIAZAC ) 360 MG 24 hr capsule Take 360 mg by mouth in the morning. 06/05/23  Yes [provider]  Flaxseed, Linseed, (FLAX SEED OIL) 1000 MG CAPS Take 2,000 mg by mouth daily.   Yes [provider]  gabapentin (NEURONTIN) 600 MG tablet Take 0.5 tablets by mouth 3 (three) times daily as needed (for neuropathy). 09/04/19  Yes [provider]  OXYGEN  Inhale 2-3 L/min into the lungs continuous.   Yes [provider]  pantoprazole  (PROTONIX ) 40 MG tablet Take 40 mg by mouth See admin instructions. Take 40 mg by mouth in the morning 30 minutes before breakfast and an additional 40 mg later in the day as needed for unresolved Gastroparesis symptoms 10/28/20  Yes [provider]  Pumpkin  Seed-Soy Germ (AZO BLADDER CONTROL/GO-LESS) CAPS Take 1 capsule by mouth in the morning and at bedtime.   Yes [provider]  Tiotropium Bromide  Monohydrate (SPIRIVA  RESPIMAT) 2.5 MCG/ACT AERS INHALE 2 SPRAY(S) BY MOUTH ONCE DAILY Patient taking differently: Inhale 2 puffs into the lungs daily. 10/19/22  Yes Sood, Vineet, MD  vitamin B-12 (CYANOCOBALAMIN) 1000 MCG tablet Take 2,000 mcg by mouth daily.   Yes [provider]    Current Facility-Administered Medications  Medication Dose Route Frequency Provider Last Rate Last Admin   0.9 %  sodium chloride  infusion   Intravenous Continuous Adhikari, Amrit, MD       acetaminophen  (TYLENOL ) tablet 650 mg  650 mg Oral Q6H PRN Zella, Mir M, MD       Or   acetaminophen  (TYLENOL ) suppository 650 mg  650 mg Rectal Q6H PRN Zella, Mir M, MD       albuterol  (PROVENTIL ) (2.5 MG/3ML) 0.083% nebulizer solution 2.5 mg  2.5 mg Nebulization Q2H PRN Zella, Mir M, MD       diltiazem  (CARDIZEM  CD) 24 hr capsule 360 mg  360 mg Oral Daily Ellington, Abby K, RPH       ondansetron  (ZOFRAN ) tablet 4 mg  4 mg Oral Q6H PRN Zella, Mir M, MD       Or   ondansetron  (ZOFRAN ) injection 4 mg  4 mg Intravenous Q6H PRN Zella, Mir M, MD       pantoprazole  (PROTONIX ) injection 40 mg  40 mg Intravenous Q24H Zella, Mir M, MD       potassium chloride  10 mEq in 100 mL IVPB  10 mEq Intravenous Q1 Hr x 4 Adhikari, Amrit, MD       traZODone  (DESYREL ) tablet 25 mg  25 mg Oral QHS PRN Zella Katha HERO, MD        Allergies as of 01/05/2024 - Review Complete 01/05/2024  Allergen Reaction Noted   Tape Other (See Comments) 01/05/2024   Clindamycin/lincomycin Hives 09/23/2011   Bactrim [sulfamethoxazole-trimethoprim] Swelling 12/01/2019   Doxycycline Other (See Comments) 03/17/2021   Elemental sulfur Rash 03/10/2016    Family History  Problem Relation Age of Onset   COPD Mother        Deceased, 78   Tremor Mother    Emphysema Mother     Other Father        Deceased, 37   Diabetes Brother    Colitis Son    Diabetes type II Son    Tremor Maternal Aunt    Colon cancer Cousin    Esophageal cancer Neg Hx    Liver cancer Neg Hx    Pancreatic cancer Neg Hx    Neuropathy Neg Hx     Social History   Socioeconomic History   Marital status: Single    Spouse name: Not on file   Number of children: 1   Years of education: Not on file   Highest education level: Not on file  Occupational History   Occupation: Engineer, drilling: [employer]  Tobacco Use   Smoking status: Former    Current packs/day: 0.00    Average packs/day: 1 pack/day for 25.0 years (25.0 ttl pk-yrs)    Types: Cigarettes    Start date: 05/18/1984    Quit date: 05/18/2009    Years since quitting: 14.6   Smokeless tobacco: Never   Tobacco comments:    Nicotine  lozenges  Vaping Use   Vaping status: Never Used  Substance and Sexual Activity   Alcohol use: Not Currently    Alcohol/week: 2.0 standard drinks of alcohol    Types: 2 Glasses of wine per week    Comment: 2 glasses of wine each night   Drug use: No   Sexual activity: Not on file  Other Topics Concern   Not on file  Social History Narrative   She currently works as a Veterinary surgeon. She lives alone.  She is divorced.      Social  Drivers of Health   Financial Resource Strain: Low Risk  (03/27/2022)   Received from [facility] visits prior to 07/18/2022., [facility]   Overall Financial Resource Strain (CARDIA)    Difficulty of Paying Living Expenses: Not very hard  Food Insecurity: No Food Insecurity (01/05/2024)   Hunger Vital Sign    Worried About Running Out of Food in the Last Year: Never true    Ran Out of Food in the Last Year: Never true  Transportation Needs: Patient Declined (01/05/2024)   PRAPARE - Administrator, Civil Service (Medical): Patient declined    Lack of Transportation (Non-Medical): Patient declined  Physical Activity: Unknown  (03/27/2022)   Received from [facility] prior to 07/18/2022., [facility]   Exercise Vital Sign    On average, how many days per week do you engage in moderate to strenuous exercise (like a brisk walk)?: Patient declined    On average, how many minutes do you engage in exercise at this level?: 30 min  Stress: No Stress Concern Present (03/27/2022)   Received from [facility] prior to 07/18/2022., [facility]   [employer] of Occupational Health - Occupational Stress Questionnaire    Feeling of Stress : Not at all  Social Connections: Patient Declined (01/05/2024)   Social Connection and Isolation Panel    Frequency of Communication with Friends and Family: Patient declined    Frequency of Social Gatherings with Friends and Family: Patient declined    Attends Religious Services: Patient declined    Database administrator or Organizations: Patient declined    Attends Banker Meetings: Patient declined    Marital Status: Patient declined  Intimate Partner Violence: Patient Declined (01/05/2024)   Humiliation, Afraid, Rape, and Kick questionnaire    Fear of Current or Ex-Partner: Patient declined    Emotionally Abused: Patient declined    Physically Abused: Patient declined    Sexually Abused: Patient declined    Review of Systems: ROS is O/W negative except as mentioned in HPI.  Physical Exam: Vital signs in last 24 hours: Temp:  [97.6 F (36.4 C)-98.3 F (36.8 C)] 98 F (36.7 C) (08/21 0528) Pulse Rate:  [72-91] 72 (08/21 0528) Resp:  [16-18] 17 (08/21 0528) BP: (121-162)/(65-85) 147/72 (08/21 0528) SpO2:  [99 %-100 %] 100 % (08/21 0528) Weight:  [56.7 kg] 56.7 kg (08/20 1748) Last BM Date : 01/05/24 General:  Alert, Well-developed, well-nourished, pleasant and cooperative in NAD Head:  Normocephalic and atraumatic. Eyes:  Sclera clear, no icterus.  Conjunctiva pink. Ears:  Normal auditory  acuity. Mouth:  No deformity or lesions.   Lungs:  Clear throughout to auscultation.  No wheezes, crackles, or rhonchi.  Heart:  Regular rate and rhythm; no murmurs, clicks, rubs, or gallops. Abdomen:  Soft, non-distended.  BS present.  Non-tender.   Msk:  Symmetrical without gross deformities. Pulses:  Normal pulses noted. Extremities:  Without clubbing or edema. Neurologic:  Alert and  oriented x4;  grossly normal neurologically. Skin:  Intact without significant lesions or rashes. Psych:  Alert and cooperative. Normal mood and affect.  Intake/Output from previous day: 08/20 0701 - 08/21 0700 In: 120 [P.O.:120] Out: -   Lab Results: Recent Labs    01/05/24 1530 01/05/24 1905 01/06/24 0144  WBC 11.9*  --  10.9*  HGB 12.7 12.1 12.1  HCT 38.5 37.3 37.8  PLT 390  --  361   BMET  Recent Labs    01/05/24 1530 01/06/24 0144  NA 133* 134*  K 4.0 3.3*  CL 96* 96*  CO2 25 26  GLUCOSE 115* 92  BUN 12 11  CREATININE 0.56 0.48  CALCIUM  10.2 8.9   LFT Recent Labs    01/05/24 1530  PROT 7.0  ALBUMIN 4.3  AST 17  ALT 16  ALKPHOS 66  BILITOT 0.3   IMPRESSION:  *LGIB:  Likely from the rectal prolapse that she experienced.  Has issues with constipation and inquires about Linzess .  She is going to check with her insurance to see if they will cover it and let us  know as an outpatient.  Hgb is normal. *COPD: On 2-3 L of oxygen  at home.   PLAN: -Advance diet and ok for discharge later today. -Needs to keep stools soft and prevent straining.  She will check with her insurance about Linzess  and contact our office for a prescription if covered, if not then we can decide on an alternate.   Harlene BIRCH. Riley Hallum  01/06/2024, 9:19 AM

## 2024-01-06 NOTE — TOC Initial Note (Signed)
 Transition of Care [facility]) - Initial/Assessment Note    Patient Details  Name: Virginia Burns MRN: [id number] Date of Birth: Jan 01, 1946  Transition of Care [facility]) CM/SW Contact:    Alfonse JONELLE Rex, RN Phone Number: 01/06/2024, 11:18 AM  Clinical Narrative:   Met with patient and son at bedside to introduce role of TOC/NCM and review for dc planning, pt has PCP on file, reports she lives alone with support from family, has home 02, portable 02 at bedside.  MOON completed. TOC Virginia continue to follow.                Expected Discharge Plan: Home/Self Care Barriers to Discharge: Continued Medical Work up   Patient Goals and CMS Choice Patient states their goals for this hospitalization and ongoing recovery are:: return home          Expected Discharge Plan and Services       Living arrangements for the past 2 months: Single Family Home                                      Prior Living Arrangements/Services Living arrangements for the past 2 months: Single Family Home Lives with:: Self Patient language and need for interpreter reviewed:: Yes Do you feel safe going back to the place where you live?: Yes      Need for Family Participation in Patient Care: Yes (Comment) Care giver support system in place?: Yes (comment) Current home services: DME (Home 02) Criminal Activity/Legal Involvement Pertinent to Current Situation/Hospitalization: No - Comment as needed  Activities of Daily Living   ADL Screening (condition at time of admission) Independently performs ADLs?: Yes (appropriate for developmental age) Is the patient deaf or have difficulty hearing?: No Does the patient have difficulty seeing, even when wearing glasses/contacts?: No Does the patient have difficulty concentrating, remembering, or making decisions?: No  Permission Sought/Granted                  Emotional Assessment Appearance:: Appears stated age Attitude/Demeanor/Rapport:  Engaged Affect (typically observed): Accepting Orientation: : Oriented to Self, Oriented to Place, Oriented to  Time, Oriented to Situation Alcohol / Substance Use: Not Applicable Psych Involvement: No (comment)  Admission diagnosis:  Lower GI bleed [K92.2] Gastrointestinal hemorrhage, unspecified gastrointestinal hemorrhage type [K92.2] Patient Active Problem List   Diagnosis Date Noted   Lower GI bleed 01/05/2024   Idiopathic polyneuropathy 12/14/2023   Decreased mobility 01/30/2020   Impaired mobility and ADLs 01/30/2020   Vitamin A87 deficiency    Urinary tract infection    Osteoporosis    Hypertension    GERD (gastroesophageal reflux disease)    COPD (chronic obstructive pulmonary disease) with emphysema (HCC)    Complication of anesthesia    Allergic rhinitis    Chronic hyponatremia 10/05/2018   Closed fracture of sacrum and coccyx (HCC) 10/03/2018   Intractable low back pain 10/03/2018   Inability to walk 10/03/2018   Palpitations 07/14/2018   Aortic ejection murmur 07/14/2018   Tachycardia, paroxysmal (HCC) 07/04/2018   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   Humerus distal fracture 01/24/2018   Multinodular goiter 06/01/2017   Chronic respiratory failure (HCC) 03/04/2015   Benign neoplasm of ascending colon 12/07/2014   Benign neoplasm of descending colon 12/07/2014   Centrilobular emphysema (HCC) 12/07/2014   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  therapy 12/07/2014   Hx of colonic polyps    OSA (  obstructive sleep apnea) 09/18/2013   History of tobacco abuse 09/18/2013   Obstructive sleep apnea (adult) (pediatric) 09/18/2013   Adjustment disorder with anxiety 08/17/2013   Anxiety state 08/17/2013   Essential tremor 05/22/2013   COPD (chronic obstructive pulmonary disease) (HCC) 02/10/2013   COPD (chronic obstructive pulmonary disease) with chronic bronchitis (HCC) 02/10/2013   Osteoarthrosis 12/09/2012   Vitamin D  deficiency 12/09/2012   Constipation 12/09/2012    Insomnia 10/22/2012   History of colonic polyps 10/07/2009   GERD with esophagitis 10/12/2007   Diverticulosis of colon 10/12/2007   Osteopenia of multiple sites 10/12/2007   Hypertension, benign 07/19/2007   Gastroparesis 07/19/2007   PCP:  [name] CROME, NP Pharmacy:   [facility] [address] [place], [place] - [address] [place] [address] Phone: [phone] Fax: [phone]     Social Drivers of Health (SDOH) Social History: SDOH Screenings   Food Insecurity: No Food Insecurity (01/05/2024)  Housing: Unknown (01/05/2024)  Transportation Needs: Patient Declined (01/05/2024)  Utilities: Patient Declined (01/05/2024)  Depression (PHQ2-9): Low Risk  (04/17/2021)  Financial Resource Strain: Low Risk  (03/27/2022)   [facility] prior to 07/18/2022., [facility]  Physical Activity: Unknown (03/27/2022)   [facility] visits prior to 07/18/2022., [facility]  Social Connections: Patient Declined (01/05/2024)  Stress: No Stress Concern Present (03/27/2022)   [facility] prior to 07/18/2022., [facility]  Tobacco Use: Medium Risk (01/05/2024)   SDOH Interventions:     Readmission Risk Interventions     No data to display

## 2024-01-06 NOTE — Telephone Encounter (Signed)
 Inbound call from patient states her insurance will pay for linzess . Requesting medication be sent to [facility] in [place] [address]. Please advise.

## 2024-01-06 NOTE — Care Management Obs Status (Signed)
 MEDICARE OBSERVATION STATUS NOTIFICATION   Patient Details  Name: Virginia Burns MRN: [id number] Date of Birth: 1945/10/24   Medicare Observation Status Notification Given:  Yes    Alfonse JONELLE Rex, RN 01/06/2024, 11:14 AM

## 2024-01-06 NOTE — Discharge Summary (Signed)
 Physician Discharge Summary  Virginia Burns FMW:[id number] DOB: [birth year] DOA: 01/05/2024  PCP: [name] CROME, NP  Admit date: 01/05/2024 Discharge date: 01/06/2024  Admitted From: Home Disposition:  Home  Discharge Condition:Stable CODE STATUS:FULL Diet recommendation: Regular   Brief/Interim Summary: Patient is a 78 year old female with history of hypertension, GERD, gastroparesis, COPD, chronic respiratory failure on 3 L of oxygen  at home, chronic constipation who presented to the ED at [facility] after her primary care physician referred her for further evaluation of lower GI bleed.  Apparently all right till yesterday evening when she had a bowel movement and felt that her rectum had prolapsed along with bloody bowel movement.  She continued to see spots of blood on wiping throughout the night.  This morning, she had a bloody diarrhea but noticed that the rectal prolapse was gone.  Patient was seen by her PCP in the office and there was concern for GI bleed so patient was referred to ED.  On presentation, she was hemodynamically stable with a stable hemoglobin of 12.  Rectal examination showed dark stool suspicious for GI bleed. Patient was requested for admission for further evaluation of lower GI bleed.    GI consulted.  She does not have any hematochezia after admission.  Denies any abdomen pain, nausea or vomiting.  Abdomen is benign on examination.  No concern for rectal prolapse at present.  After discussion with GI, we decided to discharge her.  She will be followed by GI as an outpatient.  Patient feels ready to go home    Following problems were addressed during the hospitalization:   Lower GI bleed: Presented with abrupt onset of multiple bloody bowel movement along with rectal prolapse.  Rectal prolapse was resolved.  Last night she was having loose stools with scant spots of blood.  Hemoglobin has remained stable.  GI consulted.  Plan for discharge to home.  Will be followed  by GI as an outpatient   Paroxysmal A-fib: Currently in normal sinus rhythm.  Continue Cardizem .  Not on anticoagulation but was on aspirin .   COPD: On 2 L of oxygen  at home.  Currently not in exacerbation   Hypokalemia: Supplemented with potassium      Discharge Diagnoses:  Principal Problem:   Lower GI bleed    Discharge Instructions  Discharge Instructions     Diet general   Complete by: As directed    Discharge instructions   Complete by: As directed    1)Please follow up with your PCP in a week 2)You will be called for appointment  by gastroenterology   Increase activity slowly   Complete by: As directed       Allergies as of 01/06/2024       Reactions   Tape Other (See Comments)   CLOTH TAPE IS TOLERATED. The other TEARS OPEN THE SKIN!!!! Coban wrap is okay, too!!   Clindamycin/lincomycin Hives   Bactrim [sulfamethoxazole-trimethoprim] Swelling   Doxycycline Other (See Comments)   Tooth/ gum pain   Elemental Sulfur Rash        Medication List     TAKE these medications    AMBULATORY NON FORMULARY MEDICATION Take 10 mg by mouth 3 (three) times daily before meals. Medication Name: Domperidone What changed:  when to take this additional instructions   aspirin  EC 81 MG tablet Take 81 mg by mouth daily.   AZO Bladder Control/Go-Less Caps Take 1 capsule by mouth in the morning and at bedtime.   AZO CRANBERRY PO Take  1 tablet by mouth 2 (two) times daily.   budesonide -formoterol  160-4.5 MCG/ACT inhaler Commonly known as: Symbicort  Inhale 2 puffs into the lungs 2 (two) times daily.   CALCIUM  PO Take 600 mg by mouth 2 (two) times daily.   Cranberry 1000 MG Caps Take 1,000 mg by mouth daily.   cyanocobalamin 1000 MCG tablet Commonly known as: VITAMIN B12 Take 2,000 mcg by mouth daily.   denosumab  60 MG/ML Sosy injection Commonly known as: PROLIA  Inject 60 mg into the skin every 6 (six) months.   diltiazem  30 MG tablet Commonly known as:  Cardizem  Take 1 tablet (30 mg total) by mouth as needed (for HR above 100).   diltiazem  360 MG 24 hr capsule Commonly known as: TIAZAC  Take 360 mg by mouth in the morning.   Flax Seed Oil 1000 MG Caps Take 2,000 mg by mouth daily.   gabapentin 600 MG tablet Commonly known as: NEURONTIN Take 0.5 tablets by mouth 3 (three) times daily as needed (for neuropathy).   OXYGEN  Inhale 2-3 L/min into the lungs continuous.   pantoprazole  40 MG tablet Commonly known as: PROTONIX  Take 40 mg by mouth See admin instructions. Take 40 mg by mouth in the morning 30 minutes before breakfast and an additional 40 mg later in the day as needed for unresolved Gastroparesis symptoms   Spiriva  Respimat 2.5 MCG/ACT Aers Generic drug: Tiotropium Bromide  Monohydrate INHALE 2 SPRAY(S) BY MOUTH ONCE DAILY What changed: See the new instructions.   vitamin C  1000 MG tablet Take 1,000 mg by mouth daily.   Vitamin D3 50 MCG (2000 UT) Tabs Take 2,000 Units by mouth in the morning.        Follow-up Information     [name] CROME, NP. Schedule an appointment as soon as possible for a visit in 1 week(s).   Specialty: Nurse Practitioner Contact information: [address] [place] [address] [phone]                Allergies  Allergen Reactions   Tape Other (See Comments)    CLOTH TAPE IS TOLERATED. The other TEARS OPEN THE SKIN!!!! Coban wrap is okay, too!!   Clindamycin/Lincomycin Hives   Bactrim [Sulfamethoxazole-Trimethoprim] Swelling   Doxycycline Other (See Comments)    Tooth/ gum pain   Elemental Sulfur Rash    Consultations: GI   Procedures/Studies: No results found.    Subjective: Patient seen and examined at bedside today.  Very comfortable.  Denies any new complaints.  No nausea, vomiting or rectal pain.  No hematochezia or melena.  Feels ready to go home today.  Discharge planning discussed with patient on phone  Discharge Exam: Vitals:   01/06/24 0528  01/06/24 1028  BP: (!) 147/72 (!) 158/81  Pulse: 72 89  Resp: 17 18  Temp: 98 F (36.7 C) 98.2 F (36.8 C)  SpO2: 100% 100%   Vitals:   01/05/24 2049 01/06/24 0232 01/06/24 0528 01/06/24 1028  BP: (!) 151/72 (!) 162/78 (!) 147/72 (!) 158/81  Pulse: 72 78 72 89  Resp: 17 17 17 18   Temp: 98.1 F (36.7 C) 97.6 F (36.4 C) 98 F (36.7 C) 98.2 F (36.8 C)  TempSrc: Oral Oral Oral Oral  SpO2: 100% 100% 100% 100%  Weight:      Height:        General: Pt is alert, awake, not in acute distress Cardiovascular: RRR, S1/S2 +, no rubs, no gallops Respiratory: CTA bilaterally, no wheezing, no rhonchi Abdominal: Soft,  NT, ND, bowel sounds + Extremities: no edema, no cyanosis    The results of significant diagnostics from this hospitalization (including imaging, microbiology, ancillary and laboratory) are listed below for reference.     Microbiology: No results found for this or any previous visit (from the past 240 hours).   Labs: BNP (last 3 results) No results for input(s): BNP in the last 8760 hours. Basic Metabolic Panel: Recent Labs  Lab 01/05/24 1530 01/06/24 0144  NA 133* 134*  K 4.0 3.3*  CL 96* 96*  CO2 25 26  GLUCOSE 115* 92  BUN 12 11  CREATININE 0.56 0.48  CALCIUM  10.2 8.9   Liver Function Tests: Recent Labs  Lab 01/05/24 1530  AST 17  ALT 16  ALKPHOS 66  BILITOT 0.3  PROT 7.0  ALBUMIN 4.3   No results for input(s): LIPASE, AMYLASE in the last 168 hours. No results for input(s): AMMONIA in the last 168 hours. CBC: Recent Labs  Lab 01/05/24 1530 01/05/24 1905 01/06/24 0144  WBC 11.9*  --  10.9*  NEUTROABS 9.6*  --   --   HGB 12.7 12.1 12.1  HCT 38.5 37.3 37.8  MCV 92.5  --  93.8  PLT 390  --  361   Cardiac Enzymes: No results for input(s): CKTOTAL, CKMB, CKMBINDEX, TROPONINI in the last 168 hours. BNP: Invalid input(s): POCBNP CBG: No results for input(s): GLUCAP in the last 168 hours. D-Dimer No results for  input(s): DDIMER in the last 72 hours. Hgb A1c No results for input(s): HGBA1C in the last 72 hours. Lipid Profile No results for input(s): CHOL, HDL, LDLCALC, TRIG, CHOLHDL, LDLDIRECT in the last 72 hours. Thyroid  function studies No results for input(s): TSH, T4TOTAL, T3FREE, THYROIDAB in the last 72 hours.  Invalid input(s): FREET3 Anemia work up No results for input(s): VITAMINB12, FOLATE, FERRITIN, TIBC, IRON, RETICCTPCT in the last 72 hours. Urinalysis    Component Value Date/Time   COLORURINE YELLOW 12/27/2012 0612   APPEARANCEUR CLEAR 12/27/2012 0612   LABSPEC 1.009 12/27/2012 0612   PHURINE 6.0 12/27/2012 0612   GLUCOSEU NEGATIVE 12/27/2012 0612   HGBUR NEGATIVE 12/27/2012 0612   BILIRUBINUR NEGATIVE 12/27/2012 0612   KETONESUR NEGATIVE 12/27/2012 0612   PROTEINUR NEGATIVE 12/27/2012 0612   UROBILINOGEN 0.2 12/27/2012 0612   NITRITE NEGATIVE 12/27/2012 0612   LEUKOCYTESUR NEGATIVE 12/27/2012 0612   Sepsis Labs Recent Labs  Lab 01/05/24 1530 01/06/24 0144  WBC 11.9* 10.9*   Microbiology No results found for this or any previous visit (from the past 240 hours).  Please note: You were cared for by a hospitalist during your hospital stay. Once you are discharged, your primary care physician will handle any further medical issues. Please note that NO REFILLS for any discharge medications will be authorized once you are discharged, as it is imperative that you return to your primary care physician (or establish a relationship with a primary care physician if you do not have one) for your post hospital discharge needs so that they can reassess your need for medications and monitor your lab values.    Time coordinating discharge: 40 minutes  SIGNED:   Ivonne Mustache, MD  [facility] 01/06/2024, 2:26 PM Pager [phone]  If 7PM-7AM, please contact night-coverage [email or web address] Password [email or web address]

## 2024-01-06 NOTE — Progress Notes (Signed)
 PROGRESS NOTE  Virginia Burns  FMW:[id number] DOB: 1945/08/10 DOA: 01/05/2024 PCP: [name] CROME, NP   Brief Narrative: Patient is a 78 year old female with history of hypertension, GERD, gastroparesis, COPD, chronic respiratory failure on 3 L of oxygen  at home, chronic constipation who presented to the ED at [facility] after her primary care physician referred her for further evaluation of lower GI bleed.  Apparently all right till yesterday evening when she had a bowel movement and felt that her rectum had prolapsed along with bloody bowel movement.  She continued to see spots of blood on wiping throughout the night.  This morning, she had a bloody diarrhea but noticed that the rectal prolapse was gone.  Patient was seen by her PCP in the office and there was concern for GI bleed so patient was referred to ED.  On presentation, she was hemodynamically stable with a stable hemoglobin of 12.  Rectal examination showed dark stool suspicious for GI bleed. Patient was requested for admission for further evaluation of lower GI bleed.    GI has been consulted  Assessment & Plan:  Principal Problem:   Lower GI bleed  Lower GI bleed: Presented with abrupt onset of multiple bloody bowel movement along with rectal prolapse.  Rectal prolapse was resolved.  Last night she was having loose stools with scant spots of blood.  Hemoglobin has remained stable.  GI consulted.  Paroxysmal A-fib: Currently in normal sinus rhythm.  Continue Cardizem .  Not on anticoagulation but was on aspirin .  COPD: On 2 L of oxygen  at home.  Currently not in exacerbation  Hypokalemia: Supplemented with potassium           DVT prophylaxis:SCDs Start: 01/05/24 1831     Code Status: Full Code  Family Communication: None at the bedside  Patient status:Inpatient  Patient is from :Home  Anticipated discharge un:Ynfz  Estimated DC date: After GI workup   Consultants: GI  Procedures: None  yet  Antimicrobials:  Anti-infectives (From admission, onward)    None       Subjective: Patient seen and examined at bedside today.  Very comfortable.  Lying in bed.  Denies any abdominal pain nausea or vomiting today.  Had some loose stools last night and she saw some spots of blood but hematochezia has largely resolved.  Objective: Vitals:   01/05/24 1748 01/05/24 2049 01/06/24 0232 01/06/24 0528  BP: 125/65 (!) 151/72 (!) 162/78 (!) 147/72  Pulse: 73 72 78 72  Resp: 16 17 17 17   Temp: 98.3 F (36.8 C) 98.1 F (36.7 C) 97.6 F (36.4 C) 98 F (36.7 C)  TempSrc: Oral Oral Oral Oral  SpO2: 100% 100% 100% 100%  Weight: 56.7 kg     Height: 5' 5 (1.651 m)       Intake/Output Summary (Last 24 hours) at 01/06/2024 0754 Last data filed at 01/05/2024 1807 Gross per 24 hour  Intake 120 ml  Output --  Net 120 ml   Filed Weights   01/05/24 1748  Weight: 56.7 kg    Examination:  General exam: Overall comfortable, not in distress HEENT: PERRL Respiratory system:  no wheezes or crackles  Cardiovascular system: S1 & S2 heard, RRR.  Gastrointestinal system: Abdomen is nondistended, soft and nontender. Central nervous system: Alert and oriented Extremities: No edema, no clubbing ,no cyanosis Skin: No rashes, no ulcers,no icterus     Data Reviewed: I have personally reviewed following labs and imaging studies  CBC: Recent Labs  Lab 01/05/24 1530  01/05/24 1905 01/06/24 0144  WBC 11.9*  --  10.9*  NEUTROABS 9.6*  --   --   HGB 12.7 12.1 12.1  HCT 38.5 37.3 37.8  MCV 92.5  --  93.8  PLT 390  --  361   Basic Metabolic Panel: Recent Labs  Lab 01/05/24 1530 01/06/24 0144  NA 133* 134*  K 4.0 3.3*  CL 96* 96*  CO2 25 26  GLUCOSE 115* 92  BUN 12 11  CREATININE 0.56 0.48  CALCIUM  10.2 8.9     No results found for this or any previous visit (from the past 240 hours).   Radiology Studies: No results found.  Scheduled Meds:  diltiazem   360 mg Oral Daily    pantoprazole  (PROTONIX ) IV  40 mg Intravenous Q24H   Continuous Infusions:  potassium chloride        LOS: 0 days   Ivonne Mustache, MD [facility] P8/21/2025, 7:54 AM

## 2024-01-06 NOTE — Plan of Care (Signed)
 ?  Problem: Clinical Measurements: ?Goal: Ability to maintain clinical measurements within normal limits will improve ?Outcome: Progressing ?Goal: Will remain free from infection ?Outcome: Progressing ?Goal: Diagnostic test results will improve ?Outcome: Progressing ?  ?

## 2024-01-07 ENCOUNTER — Telehealth: Payer: Self-pay

## 2024-01-07 MED ORDER — LINACLOTIDE 145 MCG PO CAPS: 145.0000 ug | ORAL_CAPSULE | Freq: Every day | ORAL | 6 refills | Status: AC

## 2024-01-07 NOTE — Telephone Encounter (Signed)
 Patient requesting Linzess  - I messaged her an asked her about her symptoms - this was her response:   I was in hospital across street yesterday and Harlene came in from your office.  I had a prolapsed rectum .  The PA & i discussed the importance of not getting constipated.  She thought low dose of linzess  would help.  I would like 30 day supply sent to [facility] on [address] in [place].   Please advise

## 2024-01-07 NOTE — Telephone Encounter (Signed)
 Linzess  145 sent to [facility]

## 2024-01-07 NOTE — Telephone Encounter (Signed)
 Hospital consultation note with Harlene and Dr. Wilhelmenia reviewed. Okay to prescribe Linzess  145 mcg daily; #30; 6 refills

## 2024-01-12 NOTE — Telephone Encounter (Signed)
 Inbound call from patient stating she has yet to have a bowel movement while taking Linzess . States it has been 3 days. Requesting a call back. Please advise, thank you

## 2024-01-12 NOTE — Telephone Encounter (Signed)
 Rock, if patient is not having any N/V or significant abdominal pain, pls have her take Miralax  tonight and ok to increase Linzess  to 290 mcg one tab daily to be taken 30 minutes before breakfast. Patient to provide further update in 1 to 2 days. THX.

## 2024-01-12 NOTE — Telephone Encounter (Signed)
 Pt states she has taken Linzess  145 for 3 days and has not had a BM. Would you like pt to double up on Linzess  for 290 dose? Please advise.

## 2024-01-12 NOTE — Telephone Encounter (Signed)
 SABRA

## 2024-02-14 ENCOUNTER — Encounter: Payer: Self-pay | Admitting: Cardiovascular Disease

## 2024-02-15 NOTE — Telephone Encounter (Signed)
 Spoke to patient.Virginia Burns  appointment schedule as ordered by Dr MALVATHEORA Mulch Patient verbalized archer

## 2024-02-17 ENCOUNTER — Encounter: Payer: Self-pay | Admitting: Cardiovascular Disease

## 2024-02-19 ENCOUNTER — Encounter: Payer: Self-pay | Admitting: Cardiovascular Disease

## 2024-02-22 ENCOUNTER — Ambulatory Visit: Admitting: Nurse Practitioner

## 2024-03-03 ENCOUNTER — Ambulatory Visit: Admitting: Physician Assistant

## 2024-03-06 ENCOUNTER — Ambulatory Visit: Admitting: Nurse Practitioner

## 2024-04-18 ENCOUNTER — Telehealth: Payer: Self-pay | Admitting: Internal Medicine

## 2024-04-18 NOTE — Telephone Encounter (Signed)
 Patient called requesting a refill for Domperidone. Please advise, thank you

## 2024-04-19 MED ORDER — AMBULATORY NON FORMULARY MEDICATION: 10.0000 mg | Freq: Three times a day (TID) | 3 refills | Status: DC

## 2024-04-19 NOTE — Telephone Encounter (Signed)
 Pt concerned because she is almost out of Domperidone but she has not been seen in office in almost 2 years.  She was, however, seen by Harlene Mail in the hospital back in August.  Please advise.  Thanks!

## 2024-04-19 NOTE — Telephone Encounter (Signed)
 Yes, please refill her prescription for 1 year.  Thanks

## 2024-04-19 NOTE — Telephone Encounter (Signed)
 Refilled Domperidone per Dr. Abran

## 2024-04-20 ENCOUNTER — Other Ambulatory Visit: Payer: Self-pay

## 2024-04-20 MED ORDER — AMBULATORY NON FORMULARY MEDICATION: 10.0000 mg | Freq: Three times a day (TID) | 3 refills | Status: DC

## 2024-04-21 ENCOUNTER — Other Ambulatory Visit: Payer: Self-pay

## 2024-04-21 MED ORDER — AMBULATORY NON FORMULARY MEDICATION: 10.0000 mg | Freq: Three times a day (TID) | 3 refills | Status: AC

## 2024-06-09 NOTE — Progress Notes (Unsigned)
" °  Cardiology Office Note:  .   Date:  06/09/2024  ID:  Virginia Burns, DOB 1945/11/03, MRN [id number] PCP: [name] CROME, NP  [facility] Providers Cardiologist:  Darryle ONEIDA Decent, MD   History of Present Illness: .   No chief complaint on file.   Virginia Burns is a 79 y.o. female with below history who presents for follow-up.   History of Present Illness               Problem List 1. COPD -2L O2 -moderate to severe 2. HTN 3. GERD 4. T chol 207, HDL 88, TG 71, LDL 105 5.  Former Tobacco abuse -20 pack years  6. Ectopic atrial tachycardia -Brief ectopic atrial tachycardia episodes detected (longest 9.8 seconds) -sx improved on diltiazem   7. 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 LBBB    ROS: All other ROS reviewed and negative. Pertinent positives noted in the HPI.     Studies Reviewed: SABRA        TTE 11/01/2023  1. Left ventricular ejection fraction, by estimation, is 60 to 65%. The  left ventricle has normal function. The left ventricle has no regional  wall motion abnormalities. Left ventricular diastolic parameters were  normal. The average left ventricular  global longitudinal strain is -22.1 %. The global longitudinal strain is  normal.   2. Right ventricular systolic function is normal. The right ventricular  size is normal.   3. The mitral valve is normal in structure. Mild mitral valve  regurgitation. No evidence of mitral stenosis.   4. The aortic valve is tricuspid. Aortic valve regurgitation is mild. No  aortic stenosis is present.   5. The inferior vena cava is normal in size with greater than 50%  respiratory variability, suggesting right atrial pressure of 3 mmHg.   CCTA 11/17/2022 1. Coronary calcium  score of 143. This was 94 percentile for age and sex matched control.   2. Normal coronary origin with right dominance.   3. LAD-mixed plaque proximal and mid with 30-49% mild stenosis. RCA is a large (4.41mm)  dominant artery that gives rise to PDA and PLA. There is minimal calcified distal plaque, 0-24% stenosis. Non  Physical Exam:   VS:  There were no vitals taken for this visit.   Wt Readings from Last 3 Encounters:  01/05/24 125 lb (56.7 kg)  12/14/23 125 lb (56.7 kg)  10/01/23 127 lb 6.4 oz (57.8 kg)    GEN: Well nourished, well developed in no acute distress NECK: No JVD; No carotid bruits CARDIAC: ***RRR, no murmurs, rubs, gallops RESPIRATORY:  Clear to auscultation without rales, wheezing or rhonchi  ABDOMEN: Soft, non-tender, non-distended EXTREMITIES:  No edema; No deformity  ASSESSMENT AND PLAN: .   Assessment and Plan                 {Are you ordering a CV Procedure (e.g. stress test, cath, DCCV, TEE, etc)?   Press F2        :[id number]}   Follow-up: No follow-ups on file.  Signed, Darryle ONEIDA. Decent, MD, [facility]  [facility]  [address] [place], [place] [address] [phone]  8:42 AM   "

## 2024-06-14 ENCOUNTER — Ambulatory Visit: Admitting: Cardiovascular Disease

## 2024-06-14 DIAGNOSIS — I2510 Atherosclerotic heart disease of native coronary artery without angina pectoris: Secondary | ICD-10-CM

## 2024-06-14 DIAGNOSIS — R0602 Shortness of breath: Secondary | ICD-10-CM

## 2024-06-14 DIAGNOSIS — I4719 Other supraventricular tachycardia: Secondary | ICD-10-CM

## 2024-06-14 DIAGNOSIS — I10 Essential (primary) hypertension: Secondary | ICD-10-CM

## 2024-08-14 ENCOUNTER — Ambulatory Visit: Admitting: Cardiovascular Disease
